# Patient Record
Sex: Female | Born: 1935 | Race: White | Hispanic: No | Marital: Married | Smoking: Former smoker
Health system: Southern US, Community
[De-identification: ages and names within clinical notes are randomized; demographics above are authoritative.]

## PROBLEM LIST (undated history)

## (undated) DIAGNOSIS — M8580 Other specified disorders of bone density and structure, unspecified site: Secondary | ICD-10-CM

## (undated) DIAGNOSIS — H269 Unspecified cataract: Secondary | ICD-10-CM

## (undated) DIAGNOSIS — I10 Essential (primary) hypertension: Secondary | ICD-10-CM

## (undated) DIAGNOSIS — E079 Disorder of thyroid, unspecified: Secondary | ICD-10-CM

## (undated) DIAGNOSIS — J449 Chronic obstructive pulmonary disease, unspecified: Secondary | ICD-10-CM

## (undated) DIAGNOSIS — R1310 Dysphagia, unspecified: Secondary | ICD-10-CM

## (undated) DIAGNOSIS — K219 Gastro-esophageal reflux disease without esophagitis: Secondary | ICD-10-CM

## (undated) HISTORY — DX: Unspecified cataract: H26.9

## (undated) HISTORY — DX: Gastro-esophageal reflux disease without esophagitis: K21.9

## (undated) HISTORY — DX: Other specified disorders of bone density and structure, unspecified site: M85.80

## (undated) HISTORY — PX: CATARACT EXTRACTION, BILATERAL: SHX1313

---
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
## 2000-03-24 ENCOUNTER — Other Ambulatory Visit: Admission: RE | Admit: 2000-03-24 | Discharge: 2000-03-24 | Payer: Self-pay | Admitting: Family Medicine

## 2004-02-05 ENCOUNTER — Other Ambulatory Visit: Admission: RE | Admit: 2004-02-05 | Discharge: 2004-02-05 | Payer: Self-pay | Admitting: Family Medicine

## 2010-01-15 ENCOUNTER — Ambulatory Visit (HOSPITAL_COMMUNITY): Admission: RE | Admit: 2010-01-15 | Discharge: 2010-01-15 | Payer: Self-pay | Admitting: Family Medicine

## 2010-12-01 ENCOUNTER — Encounter: Payer: Self-pay | Admitting: Nurse Practitioner

## 2010-12-01 DIAGNOSIS — E039 Hypothyroidism, unspecified: Secondary | ICD-10-CM | POA: Insufficient documentation

## 2010-12-01 DIAGNOSIS — K219 Gastro-esophageal reflux disease without esophagitis: Secondary | ICD-10-CM | POA: Insufficient documentation

## 2010-12-01 DIAGNOSIS — E785 Hyperlipidemia, unspecified: Secondary | ICD-10-CM | POA: Insufficient documentation

## 2010-12-01 DIAGNOSIS — I10 Essential (primary) hypertension: Secondary | ICD-10-CM | POA: Insufficient documentation

## 2010-12-01 DIAGNOSIS — M8580 Other specified disorders of bone density and structure, unspecified site: Secondary | ICD-10-CM

## 2012-09-01 ENCOUNTER — Encounter (HOSPITAL_COMMUNITY): Payer: Self-pay | Admitting: *Deleted

## 2012-09-01 ENCOUNTER — Emergency Department (HOSPITAL_COMMUNITY): Payer: Medicare Other

## 2012-09-01 ENCOUNTER — Emergency Department (HOSPITAL_COMMUNITY)
Admission: EM | Admit: 2012-09-01 | Discharge: 2012-09-02 | Disposition: A | Discharge status: Home / Self Care | Payer: Medicare Other | Attending: Emergency Medicine | Admitting: Emergency Medicine

## 2012-09-01 DIAGNOSIS — E0789 Other specified disorders of thyroid: Secondary | ICD-10-CM | POA: Insufficient documentation

## 2012-09-01 DIAGNOSIS — J189 Pneumonia, unspecified organism: Secondary | ICD-10-CM

## 2012-09-01 DIAGNOSIS — Z79899 Other long term (current) drug therapy: Secondary | ICD-10-CM | POA: Insufficient documentation

## 2012-09-01 DIAGNOSIS — I10 Essential (primary) hypertension: Secondary | ICD-10-CM | POA: Insufficient documentation

## 2012-09-01 DIAGNOSIS — R197 Diarrhea, unspecified: Secondary | ICD-10-CM | POA: Insufficient documentation

## 2012-09-01 DIAGNOSIS — R51 Headache: Secondary | ICD-10-CM | POA: Insufficient documentation

## 2012-09-01 DIAGNOSIS — J029 Acute pharyngitis, unspecified: Secondary | ICD-10-CM | POA: Insufficient documentation

## 2012-09-01 DIAGNOSIS — R062 Wheezing: Secondary | ICD-10-CM | POA: Insufficient documentation

## 2012-09-01 HISTORY — DX: Essential (primary) hypertension: I10

## 2012-09-01 HISTORY — DX: Disorder of thyroid, unspecified: E07.9

## 2012-09-01 LAB — CBC WITH DIFFERENTIAL/PLATELET
Eosinophils Absolute: 0 10*3/uL (ref 0.0–0.7)
Eosinophils Relative: 0 % (ref 0–5)
HCT: 41.1 % (ref 36.0–46.0)
Lymphs Abs: 0.8 10*3/uL (ref 0.7–4.0)
MCH: 31 pg (ref 26.0–34.0)
MCHC: 33.6 g/dL (ref 30.0–36.0)
MCV: 92.4 fL (ref 78.0–100.0)
Monocytes Absolute: 1.3 10*3/uL — ABNORMAL HIGH (ref 0.1–1.0)
Monocytes Relative: 9 % (ref 3–12)
Neutro Abs: 12.2 10*3/uL — ABNORMAL HIGH (ref 1.7–7.7)
Neutrophils Relative %: 85 % — ABNORMAL HIGH (ref 43–77)
Platelets: 196 10*3/uL (ref 150–400)

## 2012-09-01 LAB — BASIC METABOLIC PANEL
Creatinine, Ser: 0.65 mg/dL (ref 0.50–1.10)
GFR calc Af Amer: >90 mL/min (ref >90)
Glucose, Bld: 113 mg/dL — ABNORMAL HIGH (ref 70–99)
Potassium: 3.7 mEq/L (ref 3.5–5.1)
Sodium: 132 mEq/L — ABNORMAL LOW (ref 135–145)

## 2012-09-01 MED ORDER — SODIUM CHLORIDE 0.9 % IV BOLUS (SEPSIS)
1000.0000 mL | Freq: Once | INTRAVENOUS | Status: AC
  Administered 2012-09-01: 1000 mL via INTRAVENOUS

## 2012-09-01 MED ORDER — AZITHROMYCIN 250 MG PO TABS: ORAL_TABLET | ORAL | Status: DC

## 2012-09-01 MED ORDER — DEXTROSE 5 % IV SOLN
500.0000 mg | Freq: Once | INTRAVENOUS | Status: AC
  Administered 2012-09-01: 500 mg via INTRAVENOUS
  Filled 2012-09-01: qty 500

## 2012-09-01 MED ORDER — ONDANSETRON HCL 4 MG/2ML IJ SOLN
4.0000 mg | Freq: Once | INTRAMUSCULAR | Status: AC
  Administered 2012-09-01: 4 mg via INTRAVENOUS
  Filled 2012-09-01: qty 2

## 2012-09-01 MED ORDER — DEXTROSE 5 % IV SOLN
1.0000 g | Freq: Once | INTRAVENOUS | Status: AC
  Administered 2012-09-02: 1 g via INTRAVENOUS
  Filled 2012-09-01: qty 10

## 2012-09-01 NOTE — ED Provider Notes (Addendum)
History   This chart was scribed for Amy Hutching, MD by Leone Payor, ED Scribe. This patient was seen in room APA11/APA11 and the patient's care was started at 2122.  CSN: [id number]  Arrival date & time 09/01/12  1308   First MD Initiated Contact with Patient 09/01/12 2122      Chief Complaint  Patient presents with  . Cough  . Diarrhea  . Sore Throat  . Headache    The history is provided by the patient and a relative. No language interpreter was used.    ADALAIDE Chambers is a 79 y.o. female who presents to the Emergency Department complaining of new, unchanged, constant, moderate productive cough with associated sore throat, weakness, headache, diarrhea starting 2 days ago. Pt reports only drinking 2 glasses of water today. Pt's husband is also being seen here for similar symptoms.   Nothing makes symptoms better or worse. Severity is moderate   Pt has h/o thyroid disease, HTN.  Past Medical History  Diagnosis Date  . Thyroid disease   . Hypertension     History reviewed. No pertinent past surgical history.  No family history on file.  History  Substance Use Topics  . Smoking status: Not on file  . Smokeless tobacco: Not on file  . Alcohol Use: Not on file    No OB history provided.   Review of Systems A complete 10 system review of systems was obtained and all systems are negative except as noted in the HPI and PMH.    Allergies  Crestor and Niaspan  Home Medications   Current Outpatient Rx  Name  Route  Sig  Dispense  Refill  . BENAZEPRIL-HYDROCHLOROTHIAZIDE 20-12.5 MG PO TABS   Oral   Take 1 tablet by mouth daily.           Marland Kitchen CALCIUM CITRATE-VITAMIN D 250-100 MG-UNIT PO TABS   Oral   Take 1 tablet by mouth 2 (two) times daily.           Marland Kitchen VITAMIN D 2000 UNITS PO CAPS   Oral   Take by mouth. 1 daily           . LEVOTHYROXINE SODIUM 50 MCG PO TABS   Oral   Take 50 mcg by mouth daily.             BP 137/68  Pulse 92  Temp 98.2 F (36.8 C)  (Oral)  Resp 20  Ht 4\' 11"  (1.499 m)  Wt 123 lb (55.792 kg)  BMI 24.84 kg/m2  SpO2 94%  Physical Exam  Nursing note and vitals reviewed. Constitutional: She is oriented to person, place, and time. She appears well-developed and well-nourished.  HENT:  Head: Normocephalic and atraumatic.  Eyes: Conjunctivae normal and EOM are normal. Pupils are equal, round, and reactive to light.  Neck: Normal range of motion. Neck supple.  Cardiovascular: Normal rate, regular rhythm and normal heart sounds.   Pulmonary/Chest: Effort normal and breath sounds normal.  Abdominal: Soft. Bowel sounds are normal.  Musculoskeletal: Normal range of motion.  Neurological: She is alert and oriented to person, place, and time.  Skin: Skin is warm and dry.  Psychiatric: She has a normal mood and affect.    ED Course  Procedures (including critical care time)  DIAGNOSTIC STUDIES: Oxygen Saturation is 94% on room air, adequate by my interpretation.    COORDINATION OF CARE:  9:35 PM Discussed treatment plan which includes IV fluids, CXR, blood work with pt at  bedside and pt agreed to plan.    Labs Reviewed  BASIC METABOLIC PANEL - Abnormal; Notable for the following:    Sodium 132 (*)     Chloride 95 (*)     Glucose, Bld 113 (*)     GFR calc non Af Amer 80 (*)     All other components within normal limits  CBC WITH DIFFERENTIAL - Abnormal; Notable for the following:    WBC 14.3 (*)     Neutrophils Relative 85 (*)     Neutro Abs 12.2 (*)     Lymphocytes Relative 6 (*)     Monocytes Absolute 1.3 (*)     All other components within normal limits    Dg Chest 2 View  09/01/2012  *RADIOLOGY REPORT*  Clinical Data: 79 year old female with cough, congestion and fever.  CHEST - 2 VIEW  Comparison: None  Findings: Right middle lobe airspace disease is compatible with pneumonia. A small right pleural effusion is present. The cardiomediastinal silhouette is otherwise unremarkable. There is no evidence of  pneumothorax or pulmonary edema. No acute bony abnormalities are identified.  IMPRESSION: Right middle lobe pneumonia - radiographic follow up to resolution is recommended.  Small right pleural effusion.   Original Report Authenticated By: Harmon Pier, M.D.    No results found.   No diagnosis found.    MDM    Chest x-ray shows right middle lobe pneumonia. Patient is hemodynamically stable and oxygenating well. IV Rocephin and IV Zithromax. Discharge home on by mouth Zithromax.  Discussed with patient and her 2 daughters    I personally performed the services described in this documentation, which was scribed in my presence. The recorded information has been reviewed and is accurate.   Amy Hutching, MD 09/01/12 2317  Amy Hutching, MD 09/01/12 [id number]

## 2012-09-01 NOTE — ED Notes (Signed)
Pt c/o cough, sore throat, weak, headache, and diarrhea since Thursday.

## 2012-11-08 ENCOUNTER — Other Ambulatory Visit: Payer: Self-pay

## 2012-11-08 MED ORDER — LEVOTHYROXINE SODIUM 75 MCG PO TABS: 75.0000 ug | ORAL_TABLET | Freq: Every day | ORAL | Status: DC

## 2013-02-04 ENCOUNTER — Encounter: Payer: Self-pay | Admitting: General Practice

## 2013-02-04 ENCOUNTER — Ambulatory Visit (INDEPENDENT_AMBULATORY_CARE_PROVIDER_SITE_OTHER): Payer: Medicare Other | Admitting: General Practice

## 2013-02-04 VITALS — BP 124/65 | HR 65 | Temp 97.1°F | Ht 59.0 in | Wt 124.0 lb

## 2013-02-04 DIAGNOSIS — Z09 Encounter for follow-up examination after completed treatment for conditions other than malignant neoplasm: Secondary | ICD-10-CM

## 2013-02-04 DIAGNOSIS — I10 Essential (primary) hypertension: Secondary | ICD-10-CM

## 2013-02-04 DIAGNOSIS — E039 Hypothyroidism, unspecified: Secondary | ICD-10-CM

## 2013-02-04 DIAGNOSIS — K219 Gastro-esophageal reflux disease without esophagitis: Secondary | ICD-10-CM

## 2013-02-04 LAB — POCT CBC
Granulocyte percent: 85 %G — AB (ref 37–80)
HCT, POC: 38.5 % (ref 37.7–47.9)
Hemoglobin: 13.5 g/dL (ref 12.2–16.2)
MCHC: 35.1 g/dL (ref 31.8–35.4)
MCV: 89.8 fL (ref 80–97)
POC Granulocyte: 7.1 — AB (ref 2–6.9)
RBC: 4.3 M/uL (ref 4.04–5.48)
RDW, POC: 13.3 %
WBC: 8.4 10*3/uL (ref 4.6–10.2)

## 2013-02-04 LAB — COMPLETE METABOLIC PANEL WITH GFR
ALT: 12 U/L (ref 0–35)
Albumin: 3.7 g/dL (ref 3.5–5.2)
BUN: 18 mg/dL (ref 6–23)
CO2: 26 mEq/L (ref 19–32)
Calcium: 9.6 mg/dL (ref 8.4–10.5)
GFR, Est Non African American: 66 mL/min
Sodium: 139 mEq/L (ref 135–145)

## 2013-02-04 LAB — THYROID PANEL WITH TSH
T3 Uptake: 41.8 % — ABNORMAL HIGH (ref 22.5–37.0)
T4, Total: 13.7 ug/dL — ABNORMAL HIGH (ref 5.0–12.5)
TSH: 0.367 u[IU]/mL (ref 0.350–4.500)

## 2013-02-04 MED ORDER — LEVOTHYROXINE SODIUM 75 MCG PO TABS: 75.0000 ug | ORAL_TABLET | Freq: Every day | ORAL | Status: DC

## 2013-02-04 MED ORDER — OMEPRAZOLE 20 MG PO CPDR: 20.0000 mg | DELAYED_RELEASE_CAPSULE | Freq: Every day | ORAL | Status: DC

## 2013-02-04 MED ORDER — BENAZEPRIL HCL 20 MG PO TABS: 20.0000 mg | ORAL_TABLET | Freq: Every day | ORAL | Status: DC

## 2013-02-04 NOTE — Progress Notes (Signed)
  Subjective:    Patient ID: Amy Chambers, female    DOB: November 13, 1927, 79 y.o.   MRN: [id number]  HPI Presents today for 4 month follow up of chronic health conditions. She has history of Hypothyroidism and hypertension. She reports recently discontinuing GERD medication and is now experiencing acid reflux. Reports having acid reflux after eating and awakens her during the night. Reports taking medications as directed.     Review of Systems  Constitutional: Negative for fever and chills.  Respiratory: Negative for chest tightness and shortness of breath.   Cardiovascular: Negative for chest pain and palpitations.  Gastrointestinal: Negative for vomiting, abdominal pain, diarrhea and blood in stool.  Genitourinary: Negative for dysuria, hematuria, vaginal bleeding, difficulty urinating and pelvic pain.  Neurological: Negative for dizziness, syncope, weakness and headaches.  All other systems reviewed and are negative.       Objective:   Physical Exam  Constitutional: She is oriented to person, place, and time. She appears well-developed and well-nourished.  HENT:  Head: Normocephalic and atraumatic.  Right Ear: External ear normal.  Left Ear: External ear normal.  Cardiovascular: Normal rate, regular rhythm and normal heart sounds.   Pulmonary/Chest: Effort normal and breath sounds normal. No respiratory distress. She exhibits no tenderness.  Abdominal: Soft. Bowel sounds are normal. She exhibits no distension. There is no tenderness.  Neurological: She is alert and oriented to person, place, and time.  Skin: Skin is warm and dry.  Psychiatric: She has a normal mood and affect.          Assessment & Plan:  1. GERD (gastroesophageal reflux disease) - omeprazole (PRILOSEC) 20 MG capsule; Take 1 capsule (20 mg total) by mouth daily.  Dispense: 30 capsule; Refill: 3  2. Unspecified hypothyroidism - levothyroxine (SYNTHROID, LEVOTHROID) 75 MCG tablet; Take 1 tablet (75 mcg total)  by mouth daily.  Dispense: 30 tablet; Refill: 10 - Thyroid Panel With TSH - Vitamin D 25 hydroxy  3. Essential hypertension, benign - benazepril (LOTENSIN) 20 MG tablet; Take 1 tablet (20 mg total) by mouth daily.  Dispense: 30 tablet; Refill: 3 - POCT CBC - COMPLETE METABOLIC PANEL WITH GFR  4. Follow-up exam, 3-6 months since previous exam - NMR Lipoprofile with Lipids -Continue all current medications Labs pending F/u in 3 months Discussed GERD diet Discussed exercise and diet  Patient verbalized understanding Coralie Keens, [name]-C

## 2013-02-04 NOTE — Patient Instructions (Addendum)
Gastroesophageal Reflux Disease, Adult  Gastroesophageal reflux disease (GERD) happens when acid from your stomach flows up into the esophagus. When acid comes in contact with the esophagus, the acid causes soreness (inflammation) in the esophagus. Over time, GERD may create small holes (ulcers) in the lining of the esophagus.  CAUSES   · Increased body weight. This puts pressure on the stomach, making acid rise from the stomach into the esophagus.  · Smoking. This increases acid production in the stomach.  · Drinking alcohol. This causes decreased pressure in the lower esophageal sphincter (valve or ring of muscle between the esophagus and stomach), allowing acid from the stomach into the esophagus.  · Late evening meals and a full stomach. This increases pressure and acid production in the stomach.  · A malformed lower esophageal sphincter.  Sometimes, no cause is found.  SYMPTOMS   · Burning pain in the lower part of the mid-chest behind the breastbone and in the mid-stomach area. This may occur twice a week or more often.  · Trouble swallowing.  · Sore throat.  · Dry cough.  · Asthma-like symptoms including chest tightness, shortness of breath, or wheezing.  DIAGNOSIS   Your caregiver may be able to diagnose GERD based on your symptoms. In some cases, X-rays and other tests may be done to check for complications or to check the condition of your stomach and esophagus.  TREATMENT   Your caregiver may recommend over-the-counter or prescription medicines to help decrease acid production. Ask your caregiver before starting or adding any new medicines.   HOME CARE INSTRUCTIONS   · Change the factors that you can control. Ask your caregiver for guidance concerning weight loss, quitting smoking, and alcohol consumption.  · Avoid foods and drinks that make your symptoms worse, such as:  · Caffeine or alcoholic drinks.  · Chocolate.  · Peppermint or mint flavorings.  · Garlic and onions.  · Spicy foods.  · Citrus fruits,  such as oranges, lemons, or limes.  · Tomato-based foods such as sauce, chili, salsa, and pizza.  · Fried and fatty foods.  · Avoid lying down for the 3 hours prior to your bedtime or prior to taking a nap.  · Eat small, frequent meals instead of large meals.  · Wear loose-fitting clothing. Do not wear anything tight around your waist that causes pressure on your stomach.  · Raise the head of your bed 6 to 8 inches with wood blocks to help you sleep. Extra pillows will not help.  · Only take over-the-counter or prescription medicines for pain, discomfort, or fever as directed by your caregiver.  · Do not take aspirin, ibuprofen, or other nonsteroidal anti-inflammatory drugs (NSAIDs).  SEEK IMMEDIATE MEDICAL CARE IF:   · You have pain in your arms, neck, jaw, teeth, or back.  · Your pain increases or changes in intensity or duration.  · You develop nausea, vomiting, or sweating (diaphoresis).  · You develop shortness of breath, or you faint.  · Your vomit is green, yellow, black, or looks like coffee grounds or blood.  · Your stool is red, bloody, or black.  These symptoms could be signs of other problems, such as heart disease, gastric bleeding, or esophageal bleeding.  MAKE SURE YOU:   · Understand these instructions.  · Will watch your condition.  · Will get help right away if you are not doing well or get worse.  Document Released: 05/11/2005 Document Revised: 10/24/2011 Document Reviewed: 02/18/2011  [employer]® Patient   Information ©2014 [employer].

## 2013-02-05 LAB — NMR LIPOPROFILE WITH LIPIDS
HDL Particle Number: 26.3 umol/L — ABNORMAL LOW (ref >=30.5)
HDL Size: 8.8 nm — ABNORMAL LOW (ref >=9.2)
HDL-C: 35 mg/dL — ABNORMAL LOW (ref >=40)
LDL (calc): 88 mg/dL (ref <100)
LDL Size: 20.2 nm — ABNORMAL LOW (ref >20.5)
Large HDL-P: 2.5 umol/L — ABNORMAL LOW (ref >=4.8)

## 2013-02-05 LAB — VITAMIN D 25 HYDROXY (VIT D DEFICIENCY, FRACTURES): Vit D, 25-Hydroxy: 57 ng/mL (ref 30–89)

## 2013-05-31 ENCOUNTER — Other Ambulatory Visit: Payer: Self-pay | Admitting: General Practice

## 2013-06-07 ENCOUNTER — Ambulatory Visit (INDEPENDENT_AMBULATORY_CARE_PROVIDER_SITE_OTHER): Payer: Medicare Other | Admitting: General Practice

## 2013-06-07 ENCOUNTER — Encounter: Payer: Self-pay | Admitting: General Practice

## 2013-06-07 VITALS — BP 111/65 | HR 69 | Temp 97.0°F | Ht 59.0 in | Wt 118.0 lb

## 2013-06-07 DIAGNOSIS — I10 Essential (primary) hypertension: Secondary | ICD-10-CM

## 2013-06-07 DIAGNOSIS — E039 Hypothyroidism, unspecified: Secondary | ICD-10-CM

## 2013-06-07 DIAGNOSIS — J45909 Unspecified asthma, uncomplicated: Secondary | ICD-10-CM

## 2013-06-07 DIAGNOSIS — Z23 Encounter for immunization: Secondary | ICD-10-CM

## 2013-06-07 MED ORDER — BENAZEPRIL HCL 20 MG PO TABS: 20.0000 mg | ORAL_TABLET | Freq: Every day | ORAL | Status: DC

## 2013-06-07 MED ORDER — OMEPRAZOLE 20 MG PO CPDR: 20.0000 mg | DELAYED_RELEASE_CAPSULE | Freq: Every day | ORAL | Status: DC

## 2013-06-07 MED ORDER — ALBUTEROL SULFATE HFA 108 (90 BASE) MCG/ACT IN AERS: 2.0000 | INHALATION_SPRAY | Freq: Four times a day (QID) | RESPIRATORY_TRACT | Status: DC | PRN

## 2013-06-07 NOTE — Progress Notes (Signed)
  Subjective:    Patient ID: Amy Chambers, female    DOB: [birth year], 79 y.o.   MRN: [id number]  HPI Patient presents today for chronic health visit. She has a history of HTN, Hypothyroidism, GERd, and asthma. She reports taking medications as prescribed. Reports eating a regular diet. Denies checking blood pressures at home.     Review of Systems  Constitutional: Negative for fever and chills.  Respiratory: Positive for shortness of breath. Negative for chest tightness and wheezing.        Short of breath at times with exertion  Cardiovascular: Negative for chest pain and palpitations.  Gastrointestinal: Negative for nausea, vomiting, abdominal pain, diarrhea, constipation and blood in stool.  Genitourinary: Negative for dysuria, hematuria and difficulty urinating.  Neurological: Negative for dizziness, weakness and headaches.       Objective:   Physical Exam  Constitutional: She is oriented to person, place, and time. She appears well-developed and well-nourished.  HENT:  Head: Normocephalic and atraumatic.  Right Ear: External ear normal.  Left Ear: External ear normal.  Nose: Nose normal.  Mouth/Throat: Oropharynx is clear and moist.  Eyes: EOM are normal. Pupils are equal, round, and reactive to light.  Neck: Normal range of motion. Neck supple. No thyromegaly present.  Cardiovascular: Normal rate, regular rhythm and normal heart sounds.   Pulmonary/Chest: Effort normal. No respiratory distress. She exhibits no tenderness.  Diminished bilateral upper lobe breath sounds  Abdominal: Soft. Bowel sounds are normal. She exhibits no distension. There is no tenderness.  Musculoskeletal: She exhibits no edema and no tenderness.  Lymphadenopathy:    She has no cervical adenopathy.  Neurological: She is alert and oriented to person, place, and time.  Skin: Skin is warm and dry.  Psychiatric: She has a normal mood and affect.          Assessment & Plan:  1. Hypertension  -  CMP14+EGFR - benazepril (LOTENSIN) 20 MG tablet; Take 1 tablet (20 mg total) by mouth daily.  Dispense: 30 tablet; Refill: 3  2. Hypothyroid  - Thyroid Panel With TSH  3. Essential hypertension, benign  - benazepril (LOTENSIN) 20 MG tablet; Take 1 tablet (20 mg total) by mouth daily.  Dispense: 30 tablet; Refill: 3  4. Unspecified hypothyroidism   5. Asthma  - albuterol (PROVENTIL HFA;VENTOLIN HFA) 108 (90 BASE) MCG/ACT inhaler; Inhale 2 puffs into the lungs every 6 (six) hours as needed for wheezing.  Dispense: 1 Inhaler; Refill: 3  6. Need for prophylactic vaccination and inoculation against influenza   7. Need for prophylactic vaccination against Streptococcus pneumoniae (pneumococcus)  - Pneumococcal conjugate vaccine 13-valent less than 5yo IM -Continue all current medications Labs pending, cmp, lipid panel F/u in 3 months Discussed exercise and diet  Patient verbalized understanding Coralie Keens, [name]-C

## 2013-06-07 NOTE — Progress Notes (Signed)
PREVNAR and fluarix given. Pt tolerated well

## 2013-06-08 LAB — CMP14+EGFR
Albumin/Globulin Ratio: 1.8 (ref 1.1–2.5)
Albumin: 4.2 g/dL (ref 3.5–4.7)
BUN: 24 mg/dL (ref 8–27)
GFR calc non Af Amer: 71 mL/min/{1.73_m2} (ref >59)
Glucose: 86 mg/dL (ref 65–99)
Sodium: 140 mmol/L (ref 134–144)
Total Bilirubin: 0.3 mg/dL (ref 0.0–1.2)

## 2013-06-11 ENCOUNTER — Other Ambulatory Visit: Payer: Self-pay | Admitting: General Practice

## 2013-06-11 DIAGNOSIS — R7989 Other specified abnormal findings of blood chemistry: Secondary | ICD-10-CM

## 2013-06-11 DIAGNOSIS — E039 Hypothyroidism, unspecified: Secondary | ICD-10-CM

## 2013-06-11 MED ORDER — LEVOTHYROXINE SODIUM 50 MCG PO TABS: 50.0000 ug | ORAL_TABLET | Freq: Every day | ORAL | Status: DC

## 2013-06-25 ENCOUNTER — Other Ambulatory Visit: Payer: Self-pay | Admitting: General Practice

## 2013-07-25 ENCOUNTER — Other Ambulatory Visit (INDEPENDENT_AMBULATORY_CARE_PROVIDER_SITE_OTHER): Payer: Medicare Other

## 2013-07-25 DIAGNOSIS — R7989 Other specified abnormal findings of blood chemistry: Secondary | ICD-10-CM

## 2013-07-25 DIAGNOSIS — R946 Abnormal results of thyroid function studies: Secondary | ICD-10-CM

## 2013-07-25 NOTE — Progress Notes (Signed)
Pt came in for labs only 

## 2013-07-26 LAB — THYROID PANEL WITH TSH
Free Thyroxine Index: 2.3 (ref 1.2–4.9)
T3 Uptake Ratio: 27 % (ref 24–39)
TSH: 10.19 u[IU]/mL — ABNORMAL HIGH (ref 0.450–4.500)

## 2013-07-30 ENCOUNTER — Other Ambulatory Visit: Payer: Self-pay | Admitting: General Practice

## 2013-08-01 ENCOUNTER — Other Ambulatory Visit: Payer: Self-pay | Admitting: General Practice

## 2013-08-01 DIAGNOSIS — R7989 Other specified abnormal findings of blood chemistry: Secondary | ICD-10-CM

## 2013-08-01 DIAGNOSIS — E039 Hypothyroidism, unspecified: Secondary | ICD-10-CM

## 2013-08-01 MED ORDER — LEVOTHYROXINE SODIUM 75 MCG PO TABS: 75.0000 ug | ORAL_TABLET | Freq: Every day | ORAL | Status: DC

## 2013-08-27 ENCOUNTER — Ambulatory Visit (INDEPENDENT_AMBULATORY_CARE_PROVIDER_SITE_OTHER): Payer: Medicare Other | Admitting: Family Medicine

## 2013-08-27 VITALS — BP 101/61 | HR 79 | Temp 97.0°F | Ht 59.0 in | Wt 118.0 lb

## 2013-08-27 DIAGNOSIS — J069 Acute upper respiratory infection, unspecified: Secondary | ICD-10-CM

## 2013-08-27 MED ORDER — BENZONATATE 100 MG PO CAPS: 100.0000 mg | ORAL_CAPSULE | Freq: Three times a day (TID) | ORAL | Status: DC | PRN

## 2013-08-27 MED ORDER — AZITHROMYCIN 250 MG PO TABS
ORAL_TABLET | ORAL | Status: DC
Start: 2013-08-27 — End: 2014-02-12

## 2013-08-27 NOTE — Progress Notes (Signed)
   Subjective:    Patient ID: Amy Chambers, female    DOB: Oct 26, 1927, 80 y.o.   MRN: [id number]  HPI This 80 y.o. female presents for evaluation of uri sx's for a week.   Review of Systems C/lo uri sx's   No chest pain, SOB, HA, dizziness, vision change, N/V, diarrhea, constipation, dysuria, urinary urgency or frequency, myalgias, arthralgias or rash.  Objective:   Physical Exam  Vital signs noted  Well developed well nourished female.  HEENT - Head atraumatic Normocephalic                Eyes - PERRLA, Conjuctiva - clear Sclera- Clear EOMI                Ears - EAC's Wnl TM's Wnl Gross Hearing WNL                Throat - oropharanx wnl Respiratory - Lungs CTA bilateral Cardiac - RRR S1 and S2 without murmur GI - Abdomen soft Nontender and bowel sounds active x 4 Extremities - No edema. Neuro - Grossly intact.      Assessment & Plan:  URI (upper respiratory infection) - Plan: azithromycin (ZITHROMAX) 250 MG tablet, benzonatate (TESSALON PERLES) 100 MG capsule Push po fluids, rest, tylenol and motrin otc prn as directed for fever, arthralgias, and myalgias.  Follow up prn if sx's continue or persist.  Deatra CanterWilliam J Oxford [name]

## 2013-08-27 NOTE — Patient Instructions (Signed)

## 2013-10-15 ENCOUNTER — Ambulatory Visit (INDEPENDENT_AMBULATORY_CARE_PROVIDER_SITE_OTHER): Payer: Medicare Other | Admitting: General Practice

## 2013-10-15 VITALS — BP 107/66 | HR 70 | Temp 97.0°F | Wt 116.0 lb

## 2013-10-15 DIAGNOSIS — E039 Hypothyroidism, unspecified: Secondary | ICD-10-CM

## 2013-10-15 DIAGNOSIS — I10 Essential (primary) hypertension: Secondary | ICD-10-CM

## 2013-10-15 MED ORDER — OMEPRAZOLE 20 MG PO CPDR: 20.0000 mg | DELAYED_RELEASE_CAPSULE | Freq: Every day | ORAL | Status: DC

## 2013-10-15 MED ORDER — BENAZEPRIL HCL 20 MG PO TABS: 20.0000 mg | ORAL_TABLET | Freq: Every day | ORAL | Status: DC

## 2013-10-15 MED ORDER — LEVOTHYROXINE SODIUM 75 MCG PO TABS: 75.0000 ug | ORAL_TABLET | Freq: Every day | ORAL | Status: DC

## 2013-10-15 NOTE — Patient Instructions (Signed)

## 2013-10-15 NOTE — Progress Notes (Signed)
   Subjective:    Patient ID: Amy Chambers, female    DOB: 08-16-1927, 80 y.o.   MRN: [id number]  HPI  Patient presents today for chronic health visit. History of HTN, Hypothyroidism, GERD, and asthma. She reports taking medications as prescribed. Reports eating a regular diet, denies checking blood pressures at home.      Review of Systems  Constitutional: Negative for fever and chills.  Respiratory: Negative for chest tightness and shortness of breath.   Cardiovascular: Negative for chest pain and palpitations.  Gastrointestinal: Negative for nausea, vomiting, abdominal pain, diarrhea, constipation and blood in stool.  All other systems reviewed and are negative.       Objective:   Physical Exam  Constitutional: She is oriented to person, place, and time. She appears well-developed and well-nourished.  HENT:  Head: Normocephalic and atraumatic.  Right Ear: External ear normal.  Left Ear: External ear normal.  Nose: Nose normal.  Mouth/Throat: Oropharynx is clear and moist.  Eyes: EOM are normal.  Cataract surgery bilaterally  Neck: Normal range of motion. Neck supple. No thyromegaly present.  Cardiovascular: Normal rate, regular rhythm and normal heart sounds.   Pulmonary/Chest: Effort normal and breath sounds normal. No respiratory distress. She exhibits no tenderness.  Lymphadenopathy:    She has no cervical adenopathy.  Neurological: She is alert and oriented to person, place, and time.  Skin: Skin is warm and dry.  Psychiatric: She has a normal mood and affect.          Assessment & Plan:  1. Hypertension  - benazepril (LOTENSIN) 20 MG tablet; Take 1 tablet (20 mg total) by mouth daily.  Dispense: 30 tablet; Refill: 4 - CMP14+EGFR  2. Hypothyroidism  - Thyroid Panel With TSH Continue all current medications Labs pending F/u in 3 months Discussed benefits of regular exercise and healthy eating Patient verbalized understanding Erby Pian,  [name]-C

## 2013-10-16 LAB — CMP14+EGFR
ALBUMIN: 4.2 g/dL (ref 3.5–4.7)
ALT: 10 IU/L (ref 0–32)
AST: 20 IU/L (ref 0–40)
Albumin/Globulin Ratio: 1.6 (ref 1.1–2.5)
Alkaline Phosphatase: 73 IU/L (ref 39–117)
BUN / CREAT RATIO: 22 (ref 11–26)
BUN: 19 mg/dL (ref 8–27)
CALCIUM: 10.5 mg/dL — AB (ref 8.7–10.3)
CO2: 24 mmol/L (ref 18–29)
CREATININE: 0.88 mg/dL (ref 0.57–1.00)
Chloride: 99 mmol/L (ref 97–108)
GFR calc Af Amer: 70 mL/min/{1.73_m2} (ref >59)
GFR, EST NON AFRICAN AMERICAN: 61 mL/min/{1.73_m2} (ref >59)
GLOBULIN, TOTAL: 2.6 g/dL (ref 1.5–4.5)
Glucose: 91 mg/dL (ref 65–99)
Potassium: 5.3 mmol/L — ABNORMAL HIGH (ref 3.5–5.2)
Sodium: 141 mmol/L (ref 134–144)
Total Bilirubin: 0.4 mg/dL (ref 0.0–1.2)
Total Protein: 6.8 g/dL (ref 6.0–8.5)

## 2013-10-16 LAB — THYROID PANEL WITH TSH
Free Thyroxine Index: 3.7 (ref 1.2–4.9)
T3 UPTAKE RATIO: 29 % (ref 24–39)
T4, Total: 12.6 ug/dL — ABNORMAL HIGH (ref 4.5–12.0)
TSH: 1.77 u[IU]/mL (ref 0.450–4.500)

## 2013-10-21 ENCOUNTER — Other Ambulatory Visit: Payer: Self-pay | Admitting: General Practice

## 2013-10-21 DIAGNOSIS — E039 Hypothyroidism, unspecified: Secondary | ICD-10-CM

## 2013-10-21 DIAGNOSIS — E875 Hyperkalemia: Secondary | ICD-10-CM

## 2013-10-21 MED ORDER — LEVOTHYROXINE SODIUM 75 MCG PO TABS: 75.0000 ug | ORAL_TABLET | Freq: Every day | ORAL | Status: DC

## 2013-10-24 ENCOUNTER — Other Ambulatory Visit (INDEPENDENT_AMBULATORY_CARE_PROVIDER_SITE_OTHER): Payer: Medicare Other

## 2013-10-24 DIAGNOSIS — E875 Hyperkalemia: Secondary | ICD-10-CM

## 2013-10-24 NOTE — Progress Notes (Signed)
Pt came in for lab  only 

## 2013-10-25 LAB — POTASSIUM: POTASSIUM: 4.4 mmol/L (ref 3.5–5.2)

## 2014-01-07 ENCOUNTER — Encounter: Payer: Self-pay | Admitting: *Deleted

## 2014-02-11 ENCOUNTER — Ambulatory Visit: Payer: Medicare Other | Admitting: General Practice

## 2014-02-12 ENCOUNTER — Encounter: Payer: Self-pay | Admitting: Nurse Practitioner

## 2014-02-12 ENCOUNTER — Ambulatory Visit (INDEPENDENT_AMBULATORY_CARE_PROVIDER_SITE_OTHER): Payer: Medicare Other | Admitting: Nurse Practitioner

## 2014-02-12 VITALS — BP 108/62 | HR 71 | Temp 98.2°F | Ht 59.0 in | Wt 114.8 lb

## 2014-02-12 DIAGNOSIS — E785 Hyperlipidemia, unspecified: Secondary | ICD-10-CM

## 2014-02-12 DIAGNOSIS — I10 Essential (primary) hypertension: Secondary | ICD-10-CM

## 2014-02-12 DIAGNOSIS — E039 Hypothyroidism, unspecified: Secondary | ICD-10-CM

## 2014-02-12 DIAGNOSIS — M8580 Other specified disorders of bone density and structure, unspecified site: Secondary | ICD-10-CM

## 2014-02-12 DIAGNOSIS — K219 Gastro-esophageal reflux disease without esophagitis: Secondary | ICD-10-CM

## 2014-02-12 DIAGNOSIS — M949 Disorder of cartilage, unspecified: Secondary | ICD-10-CM

## 2014-02-12 DIAGNOSIS — M899 Disorder of bone, unspecified: Secondary | ICD-10-CM

## 2014-02-12 MED ORDER — BENAZEPRIL HCL 20 MG PO TABS: 20.0000 mg | ORAL_TABLET | Freq: Every day | ORAL | Status: DC

## 2014-02-12 MED ORDER — LEVOTHYROXINE SODIUM 75 MCG PO TABS: 75.0000 ug | ORAL_TABLET | Freq: Every day | ORAL | Status: DC

## 2014-02-12 MED ORDER — OMEPRAZOLE 20 MG PO CPDR: 20.0000 mg | DELAYED_RELEASE_CAPSULE | Freq: Every day | ORAL | Status: DC

## 2014-02-12 NOTE — Progress Notes (Signed)
Subjective:    Patient ID: Amy Chambers, female    DOB: 1928/08/15, 80 y.o.   MRN: [id number]  Patient here today for follow up of chronic medical problems. No complaints today  Hypertension This is a chronic problem. The current episode started more than 1 year ago. The problem is controlled. Pertinent negatives include no chest pain. The current treatment provides moderate improvement. Hypertensive end-organ damage includes a thyroid problem.  Gastrophageal Reflux She reports no abdominal pain, no chest pain, no choking, no coughing or no dysphagia. This is a chronic problem. The current episode started more than 1 year ago. The problem occurs constantly (daily prilosec. ). The symptoms are aggravated by certain foods. She has tried a PPI for the symptoms. The treatment provided significant relief.  Hyperlipidemia This is a chronic problem. The current episode started more than 1 year ago. The problem is controlled. Exacerbating diseases include hypothyroidism. Associated symptoms include leg pain. Pertinent negatives include no chest pain. Current antihyperlipidemic treatment includes statins. The current treatment provides moderate improvement of lipids. There are no compliance problems.  Risk factors for coronary artery disease include hypertension.  Osteopenia.  calcium and vitamin d OTC daily- no side effects  Review of Systems  Constitutional: Negative.   HENT: Negative.   Eyes: Negative.   Respiratory: Negative.  Negative for cough and choking.   Cardiovascular: Negative for chest pain.  Gastrointestinal: Negative for dysphagia and abdominal pain.  Endocrine: Negative.   Genitourinary: Negative.   Allergic/Immunologic: Negative.   Neurological: Negative.   Hematological: Negative.   Psychiatric/Behavioral: Negative.        Objective:   Physical Exam  Constitutional: She is oriented to person, place, and time. She appears well-developed.  HENT:  Head: Normocephalic.    Neck: Normal range of motion.  Cardiovascular: Normal rate.   Pulmonary/Chest: Effort normal.  Abdominal: Soft.  Musculoskeletal: Normal range of motion.  Neurological: She is alert and oriented to person, place, and time.  Skin: Skin is warm.    BP 108/62  Pulse 71  Temp(Src) 98.2 F (36.8 C) (Oral)  Ht 4' 11"  (1.499 m)  Wt 114 lb 12.8 oz (52.073 kg)  BMI 23.17 kg/m2  SpO2 93%       Assessment & Plan:   1. Osteopenia   2. Hyperlipemia   3. Essential hypertension   4. Gastroesophageal reflux disease without esophagitis   5. Hypothyroidism, unspecified hypothyroidism type   6. Unspecified hypothyroidism    Orders Placed This Encounter  Procedures  . DG Bone Density    Standing Status: Future     Number of Occurrences:      Standing Expiration Date: 04/14/2015    Order Specific Question:  Reason for Exam (SYMPTOM  OR DIAGNOSIS REQUIRED)    Answer:  screnning    Order Specific Question:  Preferred imaging location?    Answer:  Internal  . CMP14+EGFR  . NMR, lipoprofile   Meds ordered this encounter  Medications  . benazepril (LOTENSIN) 20 MG tablet    Sig: Take 1 tablet (20 mg total) by mouth daily.    Dispense:  30 tablet    Refill:  5    Order Specific Question:  Supervising Provider    Answer:  Chipper Herb [[id number]]  . levothyroxine (SYNTHROID, LEVOTHROID) 75 MCG tablet    Sig: Take 1 tablet (75 mcg total) by mouth daily.    Dispense:  30 tablet    Refill:  5    Order Specific  Question:  Supervising Provider    Answer:  Chipper Herb [[id number]]  . omeprazole (PRILOSEC) 20 MG capsule    Sig: Take 1 capsule (20 mg total) by mouth daily.    Dispense:  30 capsule    Refill:  5    Order Specific Question:  Supervising Provider    Answer:  Chipper Herb [[id number]]   Refused colonoscopy Labs pending Health maintenance reviewed Diet and exercise encouraged Continue all meds Follow up  In 3 months   [place], [name]

## 2014-02-12 NOTE — Patient Instructions (Signed)

## 2014-02-13 LAB — CMP14+EGFR
ALT: 14 IU/L (ref 0–32)
AST: 18 IU/L (ref 0–40)
Albumin/Globulin Ratio: 1.8 (ref 1.1–2.5)
Albumin: 4.2 g/dL (ref 3.5–4.7)
Alkaline Phosphatase: 66 IU/L (ref 39–117)
BUN/Creatinine Ratio: 22 (ref 11–26)
BUN: 19 mg/dL (ref 8–27)
CHLORIDE: 99 mmol/L (ref 97–108)
CO2: 25 mmol/L (ref 18–29)
Calcium: 10 mg/dL (ref 8.7–10.3)
Creatinine, Ser: 0.88 mg/dL (ref 0.57–1.00)
GFR calc Af Amer: 70 mL/min/{1.73_m2} (ref >59)
GFR calc non Af Amer: 61 mL/min/{1.73_m2} (ref >59)
GLOBULIN, TOTAL: 2.3 g/dL (ref 1.5–4.5)
Glucose: 84 mg/dL (ref 65–99)
Potassium: 4.9 mmol/L (ref 3.5–5.2)
Sodium: 140 mmol/L (ref 134–144)
TOTAL PROTEIN: 6.5 g/dL (ref 6.0–8.5)
Total Bilirubin: 0.3 mg/dL (ref 0.0–1.2)

## 2014-02-13 LAB — NMR, LIPOPROFILE
Cholesterol: 172 mg/dL (ref 100–199)
HDL Cholesterol by NMR: 39 mg/dL — ABNORMAL LOW (ref >39)
HDL Particle Number: 25.6 umol/L — ABNORMAL LOW (ref >=30.5)
LDL PARTICLE NUMBER: 1282 nmol/L — AB (ref <1000)
LDL Size: 20.2 nm (ref >20.5)
LDLC SERPL CALC-MCNC: 108 mg/dL — AB (ref 0–99)
LP-IR SCORE: 55 — AB (ref <=45)
Small LDL Particle Number: 812 nmol/L — ABNORMAL HIGH (ref <=527)
Triglycerides by NMR: 125 mg/dL (ref 0–149)

## 2014-04-15 ENCOUNTER — Other Ambulatory Visit: Payer: Self-pay | Admitting: General Practice

## 2014-04-23 ENCOUNTER — Encounter: Payer: Self-pay | Admitting: Pharmacist

## 2014-04-23 ENCOUNTER — Ambulatory Visit (INDEPENDENT_AMBULATORY_CARE_PROVIDER_SITE_OTHER): Payer: Medicare Other

## 2014-04-23 ENCOUNTER — Ambulatory Visit (INDEPENDENT_AMBULATORY_CARE_PROVIDER_SITE_OTHER): Payer: Medicare Other | Admitting: Pharmacist

## 2014-04-23 VITALS — BP 136/68 | HR 78 | Ht <= 58 in | Wt 115.5 lb

## 2014-04-23 DIAGNOSIS — Z0000 Encounter for general adult medical examination without abnormal findings: Secondary | ICD-10-CM

## 2014-04-23 DIAGNOSIS — M949 Disorder of cartilage, unspecified: Secondary | ICD-10-CM

## 2014-04-23 DIAGNOSIS — M8580 Other specified disorders of bone density and structure, unspecified site: Secondary | ICD-10-CM

## 2014-04-23 DIAGNOSIS — M899 Disorder of bone, unspecified: Secondary | ICD-10-CM

## 2014-04-23 LAB — HM DEXA SCAN

## 2014-04-23 NOTE — Patient Instructions (Signed)
Health Maintenance Summary    INFLUENZA VACCINE Due Fall 2015       COLONOSCOPY Due Now   Patient Declined    ZOSTAVAX Due Now   Patient Declined   Mammogram Due Now     Dexa / Bone Denisty Next Due 04/2016 Last 04/23/2014   Pneumonia Vaccine Completed 06/07/2013     TETANUS/TDAP Next Due 01/29/2022  Last 01/30/2012      Fall Prevention and Home Safety Falls cause injuries and can affect all age groups. It is possible to use preventive measures to significantly decrease the likelihood of falls. There are many simple measures which can make your home safer and prevent falls. OUTDOORS  Repair cracks and edges of walkways and driveways.  Remove high doorway thresholds.  Trim shrubbery on the main path into your home.  Have good outside lighting.  Clear walkways of tools, rocks, debris, and clutter.  Check that handrails are not broken and are securely fastened. Both sides of steps should have handrails.  Have leaves, snow, and ice cleared regularly.  Use sand or salt on walkways during winter months.  In the garage, clean up grease or oil spills. BATHROOM  Install night lights.  Install grab bars by the toilet and in the tub and shower.  Use non-skid mats or decals in the tub or shower.  Place a plastic non-slip stool in the shower to sit on, if needed.  Keep floors dry and clean up all water on the floor immediately.  Remove soap buildup in the tub or shower on a regular basis.  Secure bath mats with non-slip, double-sided rug tape.  Remove throw rugs and tripping hazards from the floors. BEDROOMS  Install night lights.  Make sure a bedside light is easy to reach.  Do not use oversized bedding.  Keep a telephone by your bedside.  Have a firm chair with side arms to use for getting dressed.  Remove throw rugs and tripping hazards from the floor. KITCHEN  Keep handles on pots and pans turned toward the center of the stove. Use back burners when  possible.  Clean up spills quickly and allow time for drying.  Avoid walking on wet floors.  Avoid hot utensils and knives.  Position shelves so they are not too high or low.  Place commonly used objects within easy reach.  If necessary, use a sturdy step stool with a grab bar when reaching.  Keep electrical cables out of the way.  Do not use floor polish or wax that makes floors slippery. If you must use wax, use non-skid floor wax.  Remove throw rugs and tripping hazards from the floor. STAIRWAYS  Never leave objects on stairs.  Place handrails on both sides of stairways and use them. Fix any loose handrails. Make sure handrails on both sides of the stairways are as long as the stairs.  Check carpeting to make sure it is firmly attached along stairs. Make repairs to worn or loose carpet promptly.  Avoid placing throw rugs at the top or bottom of stairways, or properly secure the rug with carpet tape to prevent slippage. Get rid of throw rugs, if possible.  Have an electrician put in a light switch at the top and bottom of the stairs. OTHER FALL PREVENTION TIPS  Wear low-heel or rubber-soled shoes that are supportive and fit well. Wear closed toe shoes.  When using a stepladder, make sure it is fully opened and both spreaders are firmly locked. Do not climb a  closed stepladder.  Add color or contrast paint or tape to grab bars and handrails in your home. Place contrasting color strips on first and last steps.  Learn and use mobility aids as needed. Install an electrical emergency response system.  Turn on lights to avoid dark areas. Replace light bulbs that burn out immediately. Get light switches that glow.  Arrange furniture to create clear pathways. Keep furniture in the same place.  Firmly attach carpet with non-skid or double-sided tape.  Eliminate uneven floor surfaces.  Select a carpet pattern that does not visually hide the edge of steps.  Be aware of all  pets. OTHER HOME SAFETY TIPS  Set the water temperature for 120 F (48.8 C).  Keep emergency numbers on or near the telephone.  Keep smoke detectors on every level of the home and near sleeping areas. Document Released: 07/22/2002 Document Revised: 01/31/2012 Document Reviewed: 10/21/2011 [facility] Patient Information 2015 [place], [place]. This information is not intended to replace advice given to you by your health care provider. Make sure you discuss any questions you have with your health care provider.    Preventive Care for Adults A healthy lifestyle and preventive care can promote health and wellness. Preventive health guidelines for women include the following key practices.  A routine yearly physical is a good way to check with your health care provider about your health and preventive screening. It is a chance to share any concerns and updates on your health and to receive a thorough exam.  Visit your dentist for a routine exam and preventive care every 6 months. Brush your teeth twice a day and floss once a day. Good oral hygiene prevents tooth decay and gum disease.  The frequency of eye exams is based on your age, health, family medical history, use of contact lenses, and other factors. Follow your health care provider's recommendations for frequency of eye exams.  Eat a healthy diet. Foods like vegetables, fruits, whole grains, low-fat dairy products, and lean protein foods contain the nutrients you need without too many calories. Decrease your intake of foods high in solid fats, added sugars, and salt. Eat the right amount of calories for you.Get information about a proper diet from your health care provider, if necessary.  Regular physical exercise is one of the most important things you can do for your health. Most adults should get at least 150 minutes of moderate-intensity exercise (any activity that increases your heart rate and causes you to sweat) each week. In  addition, most adults need muscle-strengthening exercises on 2 or more days a week.  Maintain a healthy weight. The body mass index (BMI) is a screening tool to identify possible weight problems. It provides an estimate of body fat based on height and weight. Your health care provider can find your BMI and can help you achieve or maintain a healthy weight.For adults 20 years and older:  A BMI below 18.5 is considered underweight.  A BMI of 18.5 to 24.9 is normal.  A BMI of 25 to 29.9 is considered overweight.  A BMI of 30 and above is considered obese.  Maintain normal blood lipids and cholesterol levels by exercising and minimizing your intake of saturated fat. Eat a balanced diet with plenty of fruit and vegetables. Blood tests for lipids and cholesterol should begin at age 58 and be repeated every 5 years. If your lipid or cholesterol levels are high, you are over 50, or you are at high risk for heart disease, you  may need your cholesterol levels checked more frequently.Ongoing high lipid and cholesterol levels should be treated with medicines if diet and exercise are not working.  If you smoke, find out from your health care provider how to quit. If you do not use tobacco, do not start.  Lung cancer screening is recommended for adults aged 81-80 years who are at high risk for developing lung cancer because of a history of smoking. A yearly low-dose CT scan of the lungs is recommended for people who have at least a 30-pack-year history of smoking and are a current smoker or have quit within the past 15 years. A pack year of smoking is smoking an average of 1 pack of cigarettes a day for 1 year (for example: 1 pack a day for 30 years or 2 packs a day for 15 years). Yearly screening should continue until the smoker has stopped smoking for at least 15 years. Yearly screening should be stopped for people who develop a health problem that would prevent them from having lung cancer treatment.  If  you are pregnant, do not drink alcohol. If you are breastfeeding, be very cautious about drinking alcohol. If you are not pregnant and choose to drink alcohol, do not have more than 1 drink per day. One drink is considered to be 12 ounces (355 mL) of beer, 5 ounces (148 mL) of wine, or 1.5 ounces (44 mL) of liquor.  Avoid use of street drugs. Do not share needles with anyone. Ask for help if you need support or instructions about stopping the use of drugs.  High blood pressure causes heart disease and increases the risk of stroke. Your blood pressure should be checked at least every 1 to 2 years. Ongoing high blood pressure should be treated with medicines if weight loss and exercise do not work.  If you are 25-41 years old, ask your health care provider if you should take aspirin to prevent strokes.  Diabetes screening involves taking a blood sample to check your fasting blood sugar level. This should be done once every 3 years, after age 46, if you are within normal weight and without risk factors for diabetes. Testing should be considered at a younger age or be carried out more frequently if you are overweight and have at least 1 risk factor for diabetes.  Breast cancer screening is essential preventive care for women. You should practice "breast self-awareness." This means understanding the normal appearance and feel of your breasts and may include breast self-examination. Any changes detected, no matter how small, should be reported to a health care provider. Women in their 30s and 30s should have a clinical breast exam (CBE) by a health care provider as part of a regular health exam every 1 to 3 years. After age 32, women should have a CBE every year. Starting at age 80, women should consider having a mammogram (breast X-ray test) every year. Women who have a family history of breast cancer should talk to their health care provider about genetic screening. Women at a high risk of breast cancer should  talk to their health care providers about having an MRI and a mammogram every year.  Breast cancer gene (BRCA)-related cancer risk assessment is recommended for women who have family members with BRCA-related cancers. BRCA-related cancers include breast, ovarian, tubal, and peritoneal cancers. Having family members with these cancers may be associated with an increased risk for harmful changes (mutations) in the breast cancer genes BRCA1 and BRCA2. Results of the  assessment will determine the need for genetic counseling and BRCA1 and BRCA2 testing.  Routine pelvic exams to screen for cancer are no longer recommended for nonpregnant women who are considered low risk for cancer of the pelvic organs (ovaries, uterus, and vagina) and who do not have symptoms. Ask your health care provider if a screening pelvic exam is right for you.  If you have had past treatment for cervical cancer or a condition that could lead to cancer, you need Pap tests and screening for cancer for at least 20 years after your treatment. If Pap tests have been discontinued, your risk factors (such as having a new sexual partner) need to be reassessed to determine if screening should be resumed. Some women have medical problems that increase the chance of getting cervical cancer. In these cases, your health care provider may recommend more frequent screening and Pap tests.  The HPV test is an additional test that may be used for cervical cancer screening. The HPV test looks for the virus that can cause the cell changes on the cervix. The cells collected during the Pap test can be tested for HPV. The HPV test could be used to screen women aged 9 years and older, and should be used in women of any age who have unclear Pap test results. After the age of 28, women should have HPV testing at the same frequency as a Pap test.  Colorectal cancer can be detected and often prevented. Most routine colorectal cancer screening begins at the age of  26 years and continues through age 17 years. However, your health care provider may recommend screening at an earlier age if you have risk factors for colon cancer. On a yearly basis, your health care provider may provide home test kits to check for hidden blood in the stool. Use of a small camera at the end of a tube, to directly examine the colon (sigmoidoscopy or colonoscopy), can detect the earliest forms of colorectal cancer. Talk to your health care provider about this at age 18, when routine screening begins. Direct exam of the colon should be repeated every 5-10 years through age 14 years, unless early forms of pre-cancerous polyps or small growths are found.  People who are at an increased risk for hepatitis B should be screened for this virus. You are considered at high risk for hepatitis B if:  You were born in a country where hepatitis B occurs often. Talk with your health care provider about which countries are considered high risk.  Your parents were born in a high-risk country and you have not received a shot to protect against hepatitis B (hepatitis B vaccine).  You have HIV or AIDS.  You use needles to inject street drugs.  You live with, or have sex with, someone who has hepatitis B.  You get hemodialysis treatment.  You take certain medicines for conditions like cancer, organ transplantation, and autoimmune conditions.  Hepatitis C blood testing is recommended for all people born from [birth year] through 1963 and any individual with known risks for hepatitis C.  Practice safe sex. Use condoms and avoid high-risk sexual practices to reduce the spread of sexually transmitted infections (STIs). STIs include gonorrhea, chlamydia, syphilis, trichomonas, herpes, HPV, and human immunodeficiency virus (HIV). Herpes, HIV, and HPV are viral illnesses that have no cure. They can result in disability, cancer, and death.  You should be screened for sexually transmitted illnesses (STIs)  including gonorrhea and chlamydia if:  You are sexually active and  are younger than 24 years.  You are older than 24 years and your health care provider tells you that you are at risk for this type of infection.  Your sexual activity has changed since you were last screened and you are at an increased risk for chlamydia or gonorrhea. Ask your health care provider if you are at risk.  If you are at risk of being infected with HIV, it is recommended that you take a prescription medicine daily to prevent HIV infection. This is called preexposure prophylaxis (PrEP). You are considered at risk if:  You are a heterosexual woman, are sexually active, and are at increased risk for HIV infection.  You take drugs by injection.  You are sexually active with a partner who has HIV.  Talk with your health care provider about whether you are at high risk of being infected with HIV. If you choose to begin PrEP, you should first be tested for HIV. You should then be tested every 3 months for as long as you are taking PrEP.  Osteoporosis is a disease in which the bones lose minerals and strength with aging. This can result in serious bone fractures or breaks. The risk of osteoporosis can be identified using a bone density scan. Women ages 45 years and over and women at risk for fractures or osteoporosis should discuss screening with their health care providers. Ask your health care provider whether you should take a calcium supplement or vitamin D to reduce the rate of osteoporosis.  Menopause can be associated with physical symptoms and risks. Hormone replacement therapy is available to decrease symptoms and risks. You should talk to your health care provider about whether hormone replacement therapy is right for you.  Use sunscreen. Apply sunscreen liberally and repeatedly throughout the day. You should seek shade when your shadow is shorter than you. Protect yourself by wearing long sleeves, pants, a  wide-brimmed hat, and sunglasses year round, whenever you are outdoors.  Once a month, do a whole body skin exam, using a mirror to look at the skin on your back. Tell your health care provider of new moles, moles that have irregular borders, moles that are larger than a pencil eraser, or moles that have changed in shape or color.  Stay current with required vaccines (immunizations).  Influenza vaccine. All adults should be immunized every year.  Tetanus, diphtheria, and acellular pertussis (Td, Tdap) vaccine. Pregnant women should receive 1 dose of Tdap vaccine during each pregnancy. The dose should be obtained regardless of the length of time since the last dose. Immunization is preferred during the 27th-36th week of gestation. An adult who has not previously received Tdap or who does not know her vaccine status should receive 1 dose of Tdap. This initial dose should be followed by tetanus and diphtheria toxoids (Td) booster doses every 10 years. Adults with an unknown or incomplete history of completing a 3-dose immunization series with Td-containing vaccines should begin or complete a primary immunization series including a Tdap dose. Adults should receive a Td booster every 10 years.  Varicella vaccine. An adult without evidence of immunity to varicella should receive 2 doses or a second dose if she has previously received 1 dose. Pregnant females who do not have evidence of immunity should receive the first dose after pregnancy. This first dose should be obtained before leaving the health care facility. The second dose should be obtained 4-8 weeks after the first dose.  Human papillomavirus (HPV) vaccine. Females aged 13-26  years who have not received the vaccine previously should obtain the 3-dose series. The vaccine is not recommended for use in pregnant females. However, pregnancy testing is not needed before receiving a dose. If a female is found to be pregnant after receiving a dose, no  treatment is needed. In that case, the remaining doses should be delayed until after the pregnancy. Immunization is recommended for any person with an immunocompromised condition through the age of 69 years if she did not get any or all doses earlier. During the 3-dose series, the second dose should be obtained 4-8 weeks after the first dose. The third dose should be obtained 24 weeks after the first dose and 16 weeks after the second dose.  Zoster vaccine. One dose is recommended for adults aged 63 years or older unless certain conditions are present.  Measles, mumps, and rubella (MMR) vaccine. Adults born before [birth year] generally are considered immune to measles and mumps. Adults born in [birth year] or later should have 1 or more doses of MMR vaccine unless there is a contraindication to the vaccine or there is laboratory evidence of immunity to each of the three diseases. A routine second dose of MMR vaccine should be obtained at least 28 days after the first dose for students attending postsecondary schools, health care workers, or international travelers. People who received inactivated measles vaccine or an unknown type of measles vaccine during 1963-1967 should receive 2 doses of MMR vaccine. People who received inactivated mumps vaccine or an unknown type of mumps vaccine before 1979 and are at high risk for mumps infection should consider immunization with 2 doses of MMR vaccine. For females of childbearing age, rubella immunity should be determined. If there is no evidence of immunity, females who are not pregnant should be vaccinated. If there is no evidence of immunity, females who are pregnant should delay immunization until after pregnancy. Unvaccinated health care workers born before [birth year] who lack laboratory evidence of measles, mumps, or rubella immunity or laboratory confirmation of disease should consider measles and mumps immunization with 2 doses of MMR vaccine or rubella immunization with 1 dose of  MMR vaccine.  Pneumococcal 13-valent conjugate (PCV13) vaccine. When indicated, a person who is uncertain of her immunization history and has no record of immunization should receive the PCV13 vaccine. An adult aged 56 years or older who has certain medical conditions and has not been previously immunized should receive 1 dose of PCV13 vaccine. This PCV13 should be followed with a dose of pneumococcal polysaccharide (PPSV23) vaccine. The PPSV23 vaccine dose should be obtained at least 8 weeks after the dose of PCV13 vaccine. An adult aged 18 years or older who has certain medical conditions and previously received 1 or more doses of PPSV23 vaccine should receive 1 dose of PCV13. The PCV13 vaccine dose should be obtained 1 or more years after the last PPSV23 vaccine dose.  Pneumococcal polysaccharide (PPSV23) vaccine. When PCV13 is also indicated, PCV13 should be obtained first. All adults aged 47 years and older should be immunized. An adult younger than age 36 years who has certain medical conditions should be immunized. Any person who resides in a nursing home or long-term care facility should be immunized. An adult smoker should be immunized. People with an immunocompromised condition and certain other conditions should receive both PCV13 and PPSV23 vaccines. People with human immunodeficiency virus (HIV) infection should be immunized as soon as possible after diagnosis. Immunization during chemotherapy or radiation therapy should be avoided. Routine use of  PPSV23 vaccine is not recommended for American Indians, [place] Natives, or people younger than 51 years unless there are medical conditions that require PPSV23 vaccine. When indicated, people who have unknown immunization and have no record of immunization should receive PPSV23 vaccine. One-time revaccination 5 years after the first dose of PPSV23 is recommended for people aged 19-64 years who have chronic kidney failure, nephrotic syndrome, asplenia, or  immunocompromised conditions. People who received 1-2 doses of PPSV23 before age 37 years should receive another dose of PPSV23 vaccine at age 91 years or later if at least 5 years have passed since the previous dose. Doses of PPSV23 are not needed for people immunized with PPSV23 at or after age 19 years.  Meningococcal vaccine. Adults with asplenia or persistent complement component deficiencies should receive 2 doses of quadrivalent meningococcal conjugate (MenACWY-D) vaccine. The doses should be obtained at least 2 months apart. Microbiologists working with certain meningococcal bacteria, [employer] recruits, people at risk during an outbreak, and people who travel to or live in countries with a high rate of meningitis should be immunized. A first-year college student up through age 29 years who is living in a residence hall should receive a dose if she did not receive a dose on or after her 16th birthday. Adults who have certain high-risk conditions should receive one or more doses of vaccine.  Hepatitis A vaccine. Adults who wish to be protected from this disease, have certain high-risk conditions, work with hepatitis A-infected animals, work in hepatitis A research labs, or travel to or work in countries with a high rate of hepatitis A should be immunized. Adults who were previously unvaccinated and who anticipate close contact with an international adoptee during the first 60 days after arrival in the [place] from a country with a high rate of hepatitis A should be immunized.  Hepatitis B vaccine. Adults who wish to be protected from this disease, have certain high-risk conditions, may be exposed to blood or other infectious body fluids, are household contacts or sex partners of hepatitis B positive people, are clients or workers in certain care facilities, or travel to or work in countries with a high rate of hepatitis B should be immunized.  Haemophilus influenzae type b (Hib) vaccine. A  previously unvaccinated person with asplenia or sickle cell disease or having a scheduled splenectomy should receive 1 dose of Hib vaccine. Regardless of previous immunization, a recipient of a hematopoietic stem cell transplant should receive a 3-dose series 6-12 months after her successful transplant. Hib vaccine is not recommended for adults with HIV infection. Preventive Services / Frequency Ages 37 to 70 years  Blood pressure check.** / Every 1 to 2 years.  Lipid and cholesterol check.** / Every 5 years beginning at age 64.  Clinical breast exam.** / Every 3 years for women in their 32s and 91s.  BRCA-related cancer risk assessment.** / For women who have family members with a BRCA-related cancer (breast, ovarian, tubal, or peritoneal cancers).  Pap test.** / Every 2 years from ages 5 through 70. Every 3 years starting at age 21 through age 46 or 63 with a history of 3 consecutive normal Pap tests.  HPV screening.** / Every 3 years from ages 62 through ages 37 to 84 with a history of 3 consecutive normal Pap tests.  Hepatitis C blood test.** / For any individual with known risks for hepatitis C.  Skin self-exam. / Monthly.  Influenza vaccine. / Every year.  Tetanus, diphtheria, and acellular pertussis (  Tdap, Td) vaccine.** / Consult your health care provider. Pregnant women should receive 1 dose of Tdap vaccine during each pregnancy. 1 dose of Td every 10 years.  Varicella vaccine.** / Consult your health care provider. Pregnant females who do not have evidence of immunity should receive the first dose after pregnancy.  HPV vaccine. / 3 doses over 6 months, if 23 and younger. The vaccine is not recommended for use in pregnant females. However, pregnancy testing is not needed before receiving a dose.  Measles, mumps, rubella (MMR) vaccine.** / You need at least 1 dose of MMR if you were born in 1957 or later. You may also need a 2nd dose. For females of childbearing age, rubella  immunity should be determined. If there is no evidence of immunity, females who are not pregnant should be vaccinated. If there is no evidence of immunity, females who are pregnant should delay immunization until after pregnancy.  Pneumococcal 13-valent conjugate (PCV13) vaccine.** / Consult your health care provider.  Pneumococcal polysaccharide (PPSV23) vaccine.** / 1 to 2 doses if you smoke cigarettes or if you have certain conditions.  Meningococcal vaccine.** / 1 dose if you are age 20 to 90 years and a Market researcher living in a residence hall, or have one of several medical conditions, you need to get vaccinated against meningococcal disease. You may also need additional booster doses.  Hepatitis A vaccine.** / Consult your health care provider.  Hepatitis B vaccine.** / Consult your health care provider.  Haemophilus influenzae type b (Hib) vaccine.** / Consult your health care provider. Ages 60 to 58 years  Blood pressure check.** / Every 1 to 2 years.  Lipid and cholesterol check.** / Every 5 years beginning at age 71 years.  Lung cancer screening. / Every year if you are aged 74-80 years and have a 30-pack-year history of smoking and currently smoke or have quit within the past 15 years. Yearly screening is stopped once you have quit smoking for at least 15 years or develop a health problem that would prevent you from having lung cancer treatment.  Clinical breast exam.** / Every year after age 4 years.  BRCA-related cancer risk assessment.** / For women who have family members with a BRCA-related cancer (breast, ovarian, tubal, or peritoneal cancers).  Mammogram.** / Every year beginning at age 74 years and continuing for as long as you are in good health. Consult with your health care provider.  Pap test.** / Every 3 years starting at age 58 years through age 46 or 81 years with a history of 3 consecutive normal Pap tests.  HPV screening.** / Every 3 years from  ages 38 years through ages 47 to 37 years with a history of 3 consecutive normal Pap tests.  Fecal occult blood test (FOBT) of stool. / Every year beginning at age 43 years and continuing until age 32 years. You may not need to do this test if you get a colonoscopy every 10 years.  Flexible sigmoidoscopy or colonoscopy.** / Every 5 years for a flexible sigmoidoscopy or every 10 years for a colonoscopy beginning at age 64 years and continuing until age 36 years.  Hepatitis C blood test.** / For all people born from [birth year] through 1965 and any individual with known risks for hepatitis C.  Skin self-exam. / Monthly.  Influenza vaccine. / Every year.  Tetanus, diphtheria, and acellular pertussis (Tdap/Td) vaccine.** / Consult your health care provider. Pregnant women should receive 1 dose of Tdap vaccine during each  pregnancy. 1 dose of Td every 10 years.  Varicella vaccine.** / Consult your health care provider. Pregnant females who do not have evidence of immunity should receive the first dose after pregnancy.  Zoster vaccine.** / 1 dose for adults aged 60 years or older.  Measles, mumps, rubella (MMR) vaccine.** / You need at least 1 dose of MMR if you were born in 1957 or later. You may also need a 2nd dose. For females of childbearing age, rubella immunity should be determined. If there is no evidence of immunity, females who are not pregnant should be vaccinated. If there is no evidence of immunity, females who are pregnant should delay immunization until after pregnancy.  Pneumococcal 13-valent conjugate (PCV13) vaccine.** / Consult your health care provider.  Pneumococcal polysaccharide (PPSV23) vaccine.** / 1 to 2 doses if you smoke cigarettes or if you have certain conditions.  Meningococcal vaccine.** / Consult your health care provider.  Hepatitis A vaccine.** / Consult your health care provider.  Hepatitis B vaccine.** / Consult your health care provider.  Haemophilus influenzae  type b (Hib) vaccine.** / Consult your health care provider. Ages 65 years and over  Blood pressure check.** / Every 1 to 2 years.  Lipid and cholesterol check.** / Every 5 years beginning at age 67 years.  Lung cancer screening. / Every year if you are aged 55-80 years and have a 30-pack-year history of smoking and currently smoke or have quit within the past 15 years. Yearly screening is stopped once you have quit smoking for at least 15 years or develop a health problem that would prevent you from having lung cancer treatment.  Clinical breast exam.** / Every year after age 74 years.  BRCA-related cancer risk assessment.** / For women who have family members with a BRCA-related cancer (breast, ovarian, tubal, or peritoneal cancers).  Mammogram.** / Every year beginning at age 38 years and continuing for as long as you are in good health. Consult with your health care provider.  Pap test.** / Every 3 years starting at age 60 years through age 96 or 20 years with 3 consecutive normal Pap tests. Testing can be stopped between 65 and 70 years with 3 consecutive normal Pap tests and no abnormal Pap or HPV tests in the past 10 years.  HPV screening.** / Every 3 years from ages 61 years through ages 54 or 74 years with a history of 3 consecutive normal Pap tests. Testing can be stopped between 65 and 70 years with 3 consecutive normal Pap tests and no abnormal Pap or HPV tests in the past 10 years.  Fecal occult blood test (FOBT) of stool. / Every year beginning at age 77 years and continuing until age 53 years. You may not need to do this test if you get a colonoscopy every 10 years.  Flexible sigmoidoscopy or colonoscopy.** / Every 5 years for a flexible sigmoidoscopy or every 10 years for a colonoscopy beginning at age 72 years and continuing until age 20 years.  Hepatitis C blood test.** / For all people born from [birth year] through 1965 and any individual with known risks for hepatitis  C.  Osteoporosis screening.** / A one-time screening for women ages 83 years and over and women at risk for fractures or osteoporosis.  Skin self-exam. / Monthly.  Influenza vaccine. / Every year.  Tetanus, diphtheria, and acellular pertussis (Tdap/Td) vaccine.** / 1 dose of Td every 10 years.  Varicella vaccine.** / Consult your health care provider.  Zoster vaccine.** /  1 dose for adults aged 64 years or older.  Pneumococcal 13-valent conjugate (PCV13) vaccine.** / Consult your health care provider.  Pneumococcal polysaccharide (PPSV23) vaccine.** / 1 dose for all adults aged 52 years and older.  Meningococcal vaccine.** / Consult your health care provider.  Hepatitis A vaccine.** / Consult your health care provider.  Hepatitis B vaccine.** / Consult your health care provider.  Haemophilus influenzae type b (Hib) vaccine.** / Consult your health care provider. ** Family history and personal history of risk and conditions may change your health care provider's recommendations. Document Released: 09/27/2001 Document Revised: 12/16/2013 Document Reviewed: 12/27/2010 [facility] Patient Information 2015 [place], [place]. This information is not intended to replace advice given to you by your health care provider. Make sure you discuss any questions you have with your health care provider.

## 2014-04-23 NOTE — Progress Notes (Signed)
Patient ID: Amy Chambers, female   DOB: 1928/05/25, 80 y.o.   MRN: [id number] Subjective:    Amy Chambers is a 80 y.o. female who presents for Medicare Initial Wellness Visit and review DEXA  Preventive Screening-Counseling & Management  Tobacco History  Smoking status  . Former Smoker  . Quit date: 08/15/1994  Smokeless tobacco  . Former Neurosurgeon  . Types: Chew  . Quit date: 08/15/1998     Current Problems (verified) Patient Active Problem List   Diagnosis Date Noted  . HTN (hypertension) 12/01/2010  . Hyperlipemia 12/01/2010  . Hypothyroidism 12/01/2010  . Osteopenia 12/01/2010  . GERD (gastroesophageal reflux disease) 12/01/2010    Medications Prior to Visit Current Outpatient Prescriptions on File Prior to Visit  Medication Sig Dispense Refill  . albuterol (PROVENTIL HFA;VENTOLIN HFA) 108 (90 BASE) MCG/ACT inhaler Inhale 2 puffs into the lungs every 6 (six) hours as needed for wheezing.  1 Inhaler  3  . aspirin 81 MG tablet Take 81 mg by mouth daily.      . benazepril (LOTENSIN) 20 MG tablet Take 1 tablet (20 mg total) by mouth daily.  30 tablet  5  . calcium-vitamin D 250-100 MG-UNIT per tablet Take 1 tablet by mouth 2 (two) times daily.       . Cholecalciferol (VITAMIN D) 2000 UNITS CAPS Take by mouth. 1 daily        . levothyroxine (SYNTHROID, LEVOTHROID) 75 MCG tablet Take 1 tablet (75 mcg total) by mouth daily.  30 tablet  5  . omeprazole (PRILOSEC) 20 MG capsule Take 1 capsule (20 mg total) by mouth daily.  30 capsule  5   No current facility-administered medications on file prior to visit.    Current Medications (verified) Current Outpatient Prescriptions  Medication Sig Dispense Refill  . albuterol (PROVENTIL HFA;VENTOLIN HFA) 108 (90 BASE) MCG/ACT inhaler Inhale 2 puffs into the lungs every 6 (six) hours as needed for wheezing.  1 Inhaler  3  . aspirin 81 MG tablet Take 81 mg by mouth daily.      . benazepril (LOTENSIN) 20 MG tablet Take 1 tablet (20 mg total)  by mouth daily.  30 tablet  5  . calcium-vitamin D 250-100 MG-UNIT per tablet Take 1 tablet by mouth 2 (two) times daily.       . Cholecalciferol (VITAMIN D) 2000 UNITS CAPS Take by mouth. 1 daily        . GARLIC PO Take 1 tablet by mouth daily.      Marland Kitchen levothyroxine (SYNTHROID, LEVOTHROID) 75 MCG tablet Take 1 tablet (75 mcg total) by mouth daily.  30 tablet  5  . omeprazole (PRILOSEC) 20 MG capsule Take 1 capsule (20 mg total) by mouth daily.  30 capsule  5   No current facility-administered medications for this visit.     Allergies (verified) Crestor and Niaspan   PAST HISTORY  Family History Family History  Problem Relation Age of Onset  . Diabetes Mother   . Cancer Brother   . Tuberculosis Father   . Early death Sister   . Early death Brother   . COPD Brother   . Emphysema Brother   . Early death Sister   . COPD Sister   . Emphysema Sister   . Aneurysm Sister   . Stroke Sister   . Cancer Sister     Social History History  Substance Use Topics  . Smoking status: Former Smoker    Quit date: 08/15/1994  .  Smokeless tobacco: Former Neurosurgeon    Types: Chew    Quit date: 08/15/1998  . Alcohol Use: No     Are there smokers in your home (other than you)? Yes  Risk Factors Current exercise habits: The patient does not participate in regular exercise at present. patient is very active - canning, cutting wood and gardening  Dietary issues discussed: none   Cardiac risk factors: advanced age (older than 23 for men, 41 for women), dyslipidemia, family history of premature cardiovascular disease, hypertension and smoking/ tobacco exposure.  Depression Screen (Note: if answer to either of the following is "Yes", a more complete depression screening is indicated)   Over the past 2 weeks, have you felt down, depressed or hopeless? No  Over the past 2 weeks, have you felt little interest or pleasure in doing things? No  Have you lost interest or pleasure in daily life? No  Do  you often feel hopeless? No  Do you cry easily over simple problems? No  Activities of Daily Living In your present state of health, do you have any difficulty performing the following activities?:  Driving? No Managing money?  No Feeding yourself? No Getting from bed to chair? No  Climbing a flight of stairs? No Preparing food and eating?: No Bathing or showering? No Getting dressed: No Getting to the toilet? No Using the toilet:No Moving around from place to place: No In the past year have you fallen or had a near fall?:No   Are you sexually active?  Yes  Do you have more than one partner?  No  Hearing Difficulties: No Do you often ask people to speak up or repeat themselves? No Do you experience ringing or noises in your ears? No Do you have difficulty understanding soft or whispered voices? No   Do you feel that you have a problem with memory? No  Do you often misplace items? No  Do you feel safe at home?  Yes  Cognitive Testing  Alert? Yes  Normal Appearance?Yes  Oriented to person? Yes  Place? Yes   Time? Yes  Recall of three objects?  Yes  Can perform simple calculations? Yes  Displays appropriate judgment?Yes  Can read the correct time from a watch face?Yes   Advanced Directives have been discussed with the patient? Yes  List the Names of Other Physician/Practitioners you currently use: 1.  Smitty Cords - OD / eye  Indicate any recent Medical Services you may have received from other than [facility] providers in the past year (date may be approximate).  Immunization History  Administered Date(s) Administered  . Influenza Whole 05/13/2010  . Influenza,inj,Quad PF,36+ Mos 06/07/2013  . Pneumococcal Conjugate-13 06/07/2013  . Pneumococcal Polysaccharide-23 04/15/2010  . Td 08/15/2001  . Tdap 01/30/2012   Last eye exam 03/2014 - requested copy of visit  Screening Tests Health Maintenance  Topic Date Due  . Influenza Vaccine  03/15/2014  . Colonoscopy   08/15/2014 (Originally 04/01/1980)  . Zostavax  08/15/2014 (Originally 04/01/1990)  . Tetanus/tdap  01/29/2022  . Pneumococcal Polysaccharide Vaccine Age 46 And Over  Completed    All answers were reviewed with the patient and necessary referrals were made:  Henrene Pastor, [facility]   04/23/2014   History reviewed: allergies, current medications, past family history, past medical history, past social history, past surgical history and problem list    Objective:    Body mass index is 24.15 kg/(m^2). BP 136/68  Pulse 78  Ht  (1.473 m)  Wt  115 lb 8 oz (52.39 kg)  BMI 24.15 kg/m2   Last Vitamin D Result:  57 (01/2013) Last GFR Result:  61 (02/12/2014)   Calcium Assessment Calcium Intake  # of servings/day  Calcium mg  Milk (8 oz) 1  x  300  =   Yogurt (4 oz) 0 x  200 = 0  Cheese (1 oz) 0 x  200 = 0  Other Calcium sources     Ca supplement Calcium  BID =    Estimated calcium intake per day     DEXA Results Date of Test T-Score for AP Spine L1-L4 T-Score for Total Left Hip T-Score for Total Right Hip  04/23/2014 -1.2 -0.6 -0.8  02/01/2012 -0.8 -0.6 -0.6  01/28/2009 -1.0 -0.7 -0.7  12/25/2006 -1.7 -0.8 -0.8   FRAX 10 year estimate: Total FX risk:  15%  (consider medication if >/= 20%) Hip FX risk:  4.4%  (consider medication if >/= 3%)    Assessment:     Initial Annual Medicare Wellness Visit Osteopenia with decreased BMD     Plan:     During the course of the visit the patient was educated and counseled about appropriate screening and preventive services including:    Pneumococcal vaccine - completed  Influenza vaccine - plan to get November 2015  Hepatitis B vaccine - declined  Td vaccine - UTD  Screening mammography - declined  Bone densitometry screening - completed today  Colorectal cancer screening - declined  Diabetes screening - UTD  Glaucoma screening - requested records  Advanced directives: patient given  [facility] Connections Packet  Discussed restarting alendronate or starting Evista for osteopenia.  Patient refused pharmacotherapy for osteopenia.  She understands she is at risk for fractures.  recommend increase calcium to  daily through supplementation or diet.   recommend weight bearing exercise - 30 minutes at least 4 days per week.    Counseled and educated about fall risk and prevention.  Recheck DEXA:  2 years  Time spent counseling patient:  45 minutes   Patient Instructions (the written plan) was given to the patient.  Medicare Attestation I have personally reviewed: The patient's medical and social history Their use of alcohol, tobacco or illicit drugs Their current medications and supplements The patient's functional ability including ADLs,fall risks, home safety risks, cognitive, and hearing and visual impairment Diet and physical activities Evidence for depression or mood disorders  The patient's weight, height, BMI, and BP/HR have been recorded in the chart.  I have made referrals, counseling, and provided education to the patient based on review of the above and I have provided the patient with a written personalized care plan for preventive services.     Henrene Pastor, [facility]   04/23/2014

## 2014-06-16 ENCOUNTER — Ambulatory Visit (INDEPENDENT_AMBULATORY_CARE_PROVIDER_SITE_OTHER): Payer: Medicare Other | Admitting: Family Medicine

## 2014-06-16 ENCOUNTER — Encounter: Payer: Self-pay | Admitting: Nurse Practitioner

## 2014-06-16 ENCOUNTER — Ambulatory Visit (INDEPENDENT_AMBULATORY_CARE_PROVIDER_SITE_OTHER): Payer: Medicare Other | Admitting: Nurse Practitioner

## 2014-06-16 VITALS — BP 135/68 | HR 70 | Temp 97.0°F | Ht <= 58 in | Wt 116.4 lb

## 2014-06-16 DIAGNOSIS — E785 Hyperlipidemia, unspecified: Secondary | ICD-10-CM

## 2014-06-16 DIAGNOSIS — M8580 Other specified disorders of bone density and structure, unspecified site: Secondary | ICD-10-CM

## 2014-06-16 DIAGNOSIS — I10 Essential (primary) hypertension: Secondary | ICD-10-CM

## 2014-06-16 DIAGNOSIS — E039 Hypothyroidism, unspecified: Secondary | ICD-10-CM

## 2014-06-16 DIAGNOSIS — Z23 Encounter for immunization: Secondary | ICD-10-CM

## 2014-06-16 NOTE — Patient Instructions (Addendum)

## 2014-06-16 NOTE — Progress Notes (Signed)
   Subjective:    Patient ID: Amy Chambers, female    DOB: 03/17/1928, 80 y.o.   MRN: [id number]  Patient here today for follow up of chronic medical problems. No acute complaints today.   Hypertension This is a chronic problem. The current episode started more than 1 year ago. The problem has been gradually improving since onset. The problem is uncontrolled. Pertinent negatives include no chest pain or peripheral edema. Risk factors for coronary artery disease include sedentary lifestyle, dyslipidemia and post-menopausal state. Past treatments include ACE inhibitors. The current treatment provides moderate improvement.  Gastrophageal Reflux She complains of coughing and a sore throat. She reports no abdominal pain, no chest pain or no choking. This is a chronic problem. The current episode started more than 1 year ago. The problem occurs occasionally. The problem has been waxing and waning. The symptoms are aggravated by certain foods. Risk factors include smoking/tobacco exposure. She has tried a PPI for the symptoms. The treatment provided moderate relief.  Hyperlipidemia This is a chronic problem. The current episode started more than 1 year ago. The problem is controlled. Exacerbating diseases include hypothyroidism. Pertinent negatives include no chest pain or leg pain. Current antihyperlipidemic treatment includes diet change (garlic PO.). The current treatment provides moderate improvement of lipids. Risk factors for coronary artery disease include hypertension, post-menopausal and dyslipidemia.  Osteopenia.  calcium and vitamin d OTC daily- no side effects  Review of Systems  Constitutional: Negative.   HENT: Positive for congestion and sore throat.   Eyes: Negative.   Respiratory: Positive for cough. Negative for choking.   Cardiovascular: Negative for chest pain.  Gastrointestinal: Negative for abdominal pain.  Endocrine: Negative.   Genitourinary: Negative.   Allergic/Immunologic:  Negative.   Neurological: Negative.   Hematological: Negative.   Psychiatric/Behavioral: Negative.        Objective:   Physical Exam  Constitutional: She is oriented to person, place, and time. She appears well-developed.  HENT:  Head: Normocephalic.  Eyes: Conjunctivae are normal. Pupils are equal, round, and reactive to light.  Neck: Normal range of motion.  Cardiovascular: Normal rate and regular rhythm.   Pulmonary/Chest: Effort normal and breath sounds normal.  Abdominal: Soft.  Musculoskeletal: Normal range of motion.  Neurological: She is alert and oriented to person, place, and time. She has normal reflexes.  Skin: Skin is warm.  Psychiatric: She has a normal mood and affect.    BP 135/68 mmHg  Pulse 70  Temp(Src) 97 F (36.1 C) (Oral)  Ht 4' 10" (1.473 m)  Wt 116 lb 6.4 oz (52.799 kg)  BMI 24.33 kg/m2       Assessment & Plan:    1. Essential hypertension Low Na + diet - CMP14+EGFR  2. Hyperlipemia Watch fats in diet - NMR, lipoprofile  3. Hypothyroidism, unspecified hypothyroidism type - Thyroid Panel With TSH  4. Osteopenia Weight bearing exercises   Flu shot today Labs pending Health maintenance reviewed Diet and exercise encouraged Continue all meds Follow up  In 3 months   [name] [name], [name]     

## 2014-06-17 LAB — THYROID PANEL WITH TSH
FREE THYROXINE INDEX: 3.3 (ref 1.2–4.9)
T3 UPTAKE RATIO: 29 % (ref 24–39)
T4 TOTAL: 11.5 ug/dL (ref 4.5–12.0)
TSH: 0.854 u[IU]/mL (ref 0.450–4.500)

## 2014-06-17 LAB — CMP14+EGFR
ALBUMIN: 4 g/dL (ref 3.5–4.7)
ALT: 9 IU/L (ref 0–32)
AST: 14 IU/L (ref 0–40)
Albumin/Globulin Ratio: 1.8 (ref 1.1–2.5)
Alkaline Phosphatase: 74 IU/L (ref 39–117)
BUN/Creatinine Ratio: 27 — ABNORMAL HIGH (ref 11–26)
BUN: 21 mg/dL (ref 8–27)
CALCIUM: 9.5 mg/dL (ref 8.7–10.3)
CHLORIDE: 99 mmol/L (ref 97–108)
CO2: 26 mmol/L (ref 18–29)
Creatinine, Ser: 0.79 mg/dL (ref 0.57–1.00)
GFR calc Af Amer: 79 mL/min/{1.73_m2} (ref >59)
GFR calc non Af Amer: 69 mL/min/{1.73_m2} (ref >59)
GLUCOSE: 88 mg/dL (ref 65–99)
Globulin, Total: 2.2 g/dL (ref 1.5–4.5)
POTASSIUM: 4.8 mmol/L (ref 3.5–5.2)
Sodium: 138 mmol/L (ref 134–144)
TOTAL PROTEIN: 6.2 g/dL (ref 6.0–8.5)
Total Bilirubin: 0.3 mg/dL (ref 0.0–1.2)

## 2014-06-17 LAB — NMR, LIPOPROFILE
Cholesterol: 150 mg/dL (ref 100–199)
HDL CHOLESTEROL BY NMR: 39 mg/dL — AB (ref >39)
HDL Particle Number: 28.5 umol/L — ABNORMAL LOW (ref >=30.5)
LDL Particle Number: 1373 nmol/L — ABNORMAL HIGH (ref <1000)
LDL Size: 20.2 nm (ref >20.5)
LDL-C: 92 mg/dL (ref 0–99)
LP-IR SCORE: 51 — AB (ref <=45)
SMALL LDL PARTICLE NUMBER: 806 nmol/L — AB (ref <=527)
Triglycerides by NMR: 96 mg/dL (ref 0–149)

## 2014-06-18 ENCOUNTER — Telehealth: Payer: Self-pay | Admitting: *Deleted

## 2014-06-18 NOTE — Telephone Encounter (Signed)
-----   [facility]-Margaret [name], [name] sent at 06/17/2014 11:25 AM EST ----- Kidney and liver function stable Thyroid normal LDL particle numbers are increasing Continue current meds- low fat diet and exercise and recheck in 3 months

## 2014-08-18 ENCOUNTER — Other Ambulatory Visit: Payer: Self-pay | Admitting: Nurse Practitioner

## 2014-08-25 ENCOUNTER — Encounter: Payer: Self-pay | Admitting: *Deleted

## 2014-10-16 ENCOUNTER — Ambulatory Visit (INDEPENDENT_AMBULATORY_CARE_PROVIDER_SITE_OTHER): Payer: Medicare Other | Admitting: Nurse Practitioner

## 2014-10-16 ENCOUNTER — Ambulatory Visit (INDEPENDENT_AMBULATORY_CARE_PROVIDER_SITE_OTHER): Payer: Medicare Other

## 2014-10-16 ENCOUNTER — Encounter: Payer: Self-pay | Admitting: Nurse Practitioner

## 2014-10-16 VITALS — BP 114/63 | HR 72 | Temp 97.0°F | Ht <= 58 in | Wt 114.0 lb

## 2014-10-16 DIAGNOSIS — M8580 Other specified disorders of bone density and structure, unspecified site: Secondary | ICD-10-CM | POA: Diagnosis not present

## 2014-10-16 DIAGNOSIS — K219 Gastro-esophageal reflux disease without esophagitis: Secondary | ICD-10-CM | POA: Diagnosis not present

## 2014-10-16 DIAGNOSIS — E039 Hypothyroidism, unspecified: Secondary | ICD-10-CM | POA: Diagnosis not present

## 2014-10-16 DIAGNOSIS — R0602 Shortness of breath: Secondary | ICD-10-CM

## 2014-10-16 DIAGNOSIS — I10 Essential (primary) hypertension: Secondary | ICD-10-CM | POA: Diagnosis not present

## 2014-10-16 DIAGNOSIS — J410 Simple chronic bronchitis: Secondary | ICD-10-CM

## 2014-10-16 DIAGNOSIS — E785 Hyperlipidemia, unspecified: Secondary | ICD-10-CM

## 2014-10-16 MED ORDER — BENAZEPRIL HCL 20 MG PO TABS: 20.0000 mg | ORAL_TABLET | Freq: Every day | ORAL | Status: DC

## 2014-10-16 MED ORDER — LEVOTHYROXINE SODIUM 75 MCG PO TABS: 75.0000 ug | ORAL_TABLET | Freq: Every day | ORAL | Status: DC

## 2014-10-16 MED ORDER — BUDESONIDE-FORMOTEROL FUMARATE 80-4.5 MCG/ACT IN AERO: 2.0000 | INHALATION_SPRAY | Freq: Two times a day (BID) | RESPIRATORY_TRACT | Status: DC

## 2014-10-16 MED ORDER — ALBUTEROL SULFATE HFA 108 (90 BASE) MCG/ACT IN AERS: 2.0000 | INHALATION_SPRAY | Freq: Four times a day (QID) | RESPIRATORY_TRACT | Status: DC | PRN

## 2014-10-16 NOTE — Progress Notes (Addendum)
Subjective:    Patient ID: Amy Chambers, female    DOB: December 04, 1927, 81 y.o.   MRN: [id number]  Patient here today for follow up of chronic medical problems. She is c/o feeling sob and has been using albuterol inhaler >5-6X a week and sometimes daily. She says it really helps a lot when she uses it. She says that she gets to coughing and the albuterol helps with that.  Hypertension This is a chronic problem. The current episode started more than 1 year ago. The problem has been gradually improving since onset. The problem is controlled. Associated symptoms include shortness of breath. Pertinent negatives include no chest pain or peripheral edema. Risk factors for coronary artery disease include sedentary lifestyle, dyslipidemia and post-menopausal state. Past treatments include ACE inhibitors. The current treatment provides moderate improvement.  Gastrophageal Reflux She complains of coughing. She reports no abdominal pain, no chest pain or no choking. This is a chronic problem. The current episode started more than 1 year ago. The problem occurs occasionally. The problem has been waxing and waning. The symptoms are aggravated by certain foods. Risk factors include smoking/tobacco exposure. She has tried a PPI for the symptoms. The treatment provided moderate relief.  Hyperlipidemia This is a chronic problem. The current episode started more than 1 year ago. The problem is controlled. Exacerbating diseases include hypothyroidism. Associated symptoms include shortness of breath. Pertinent negatives include no chest pain or leg pain. Current antihyperlipidemic treatment includes diet change (garlic PO.). The current treatment provides moderate improvement of lipids. Risk factors for coronary artery disease include hypertension, post-menopausal and dyslipidemia.  Osteopenia.  calcium and vitamin d OTC daily- no side effects  Review of Systems  Constitutional: Negative.   HENT: Negative.   Eyes:  Negative.   Respiratory: Positive for cough and shortness of breath. Negative for choking.   Cardiovascular: Negative for chest pain.  Gastrointestinal: Negative for abdominal pain.  Endocrine: Negative.   Genitourinary: Negative.   Allergic/Immunologic: Negative.   Neurological: Negative.   Hematological: Negative.   Psychiatric/Behavioral: Negative.        Objective:   Physical Exam  Constitutional: She is oriented to person, place, and time. She appears well-developed.  HENT:  Head: Normocephalic.  Eyes: Conjunctivae are normal. Pupils are equal, round, and reactive to light.  Neck: Normal range of motion.  Cardiovascular: Normal rate and regular rhythm.   Pulmonary/Chest: Effort normal and breath sounds normal.  Abdominal: Soft.  Musculoskeletal: Normal range of motion.  Neurological: She is alert and oriented to person, place, and time. She has normal reflexes.  Skin: Skin is warm.  Psychiatric: She has a normal mood and affect.    BP 114/63 mmHg  Pulse 72  Temp(Src) 97 F (36.1 C) (Oral)  Ht 4' 10"  (1.473 m)  Wt 114 lb (51.71 kg)  BMI 23.83 kg/m2  EKG- NSR-[name] Hassell Done, [name]  Chest x ray chronic bronchitic changes-Preliminary reading by Ronnald Collum, [name]  [facility]      Assessment & Plan:   1. Essential hypertension Do not ad slat to diet - benazepril (LOTENSIN) 20 MG tablet; Take 1 tablet (20 mg total) by mouth daily.  Dispense: 30 tablet; Refill: 5  2. Gastroesophageal reflux disease without esophagitis Avoid spicy and fatty foods  3. Hypothyroidism, unspecified hypothyroidism type - levothyroxine (SYNTHROID, LEVOTHROID) 75 MCG tablet; Take 1 tablet (75 mcg total) by mouth daily.  Dispense: 30 tablet; Refill: 5  4. Osteopenia Weight bearing exercises  5. Hyperlipemia Low fat diet  6. SOB (shortness of breath) - DG Chest 2 View; Future - EKG 12-Lead  7. Simple chronic bronchitis Added symbicort  Only use albuterol as rescue  -  budesonide-formoterol (SYMBICORT) 80-4.5 MCG/ACT inhaler; Inhale 2 puffs into the lungs 2 (two) times daily.  Dispense: 1 Inhaler; Refill: 3 - albuterol (PROVENTIL HFA;VENTOLIN HFA) 108 (90 BASE) MCG/ACT inhaler; Inhale 2 puffs into the lungs every 6 (six) hours as needed for wheezing.  Dispense: 1 Inhaler; Refill: 3  Orders Placed This Encounter  Procedures  . DG Chest 2 View    Standing Status: Future     Number of Occurrences: 1     Standing Expiration Date: 12/16/2015    Order Specific Question:  Reason for Exam (SYMPTOM  OR DIAGNOSIS REQUIRED)    Answer:  SOB    Order Specific Question:  Preferred imaging location?    Answer:  Internal  . CMP14+EGFR  . NMR, lipoprofile  . Thyroid Panel With TSH  . EKG 12-Lead      Labs pending Health maintenance reviewed Diet and exercise encouraged Continue all meds Follow up  In 55month   MPorter [name]

## 2014-10-16 NOTE — Patient Instructions (Signed)

## 2014-10-16 NOTE — Addendum Note (Signed)
Addended by: [name], [name] on: 10/16/2014 11:30 AM   Modules accepted: Orders

## 2014-10-17 LAB — CMP14+EGFR
ALT: 9 IU/L (ref 0–32)
AST: 17 IU/L (ref 0–40)
Albumin/Globulin Ratio: 1.6 (ref 1.1–2.5)
Albumin: 4.1 g/dL (ref 3.5–4.7)
Alkaline Phosphatase: 68 IU/L (ref 39–117)
BILIRUBIN TOTAL: 0.3 mg/dL (ref 0.0–1.2)
BUN/Creatinine Ratio: 29 — ABNORMAL HIGH (ref 11–26)
BUN: 23 mg/dL (ref 8–27)
CHLORIDE: 99 mmol/L (ref 97–108)
CO2: 23 mmol/L (ref 18–29)
Calcium: 9.8 mg/dL (ref 8.7–10.3)
Creatinine, Ser: 0.78 mg/dL (ref 0.57–1.00)
GFR calc non Af Amer: 70 mL/min/{1.73_m2} (ref >59)
GFR, EST AFRICAN AMERICAN: 81 mL/min/{1.73_m2} (ref >59)
GLUCOSE: 98 mg/dL (ref 65–99)
Globulin, Total: 2.6 g/dL (ref 1.5–4.5)
POTASSIUM: 4.6 mmol/L (ref 3.5–5.2)
Sodium: 138 mmol/L (ref 134–144)
TOTAL PROTEIN: 6.7 g/dL (ref 6.0–8.5)

## 2014-10-17 LAB — NMR, LIPOPROFILE
Cholesterol: 152 mg/dL (ref 100–199)
HDL Cholesterol by NMR: 33 mg/dL — ABNORMAL LOW (ref >39)
HDL Particle Number: 24.6 umol/L — ABNORMAL LOW (ref >=30.5)
LDL PARTICLE NUMBER: 1245 nmol/L — AB (ref <1000)
LDL Size: 20.8 nm (ref >20.5)
LDL-C: 75 mg/dL (ref 0–99)
LP-IR SCORE: 63 — AB (ref <=45)
Small LDL Particle Number: 685 nmol/L — ABNORMAL HIGH (ref <=527)
Triglycerides by NMR: 220 mg/dL — ABNORMAL HIGH (ref 0–149)

## 2014-10-17 LAB — THYROID PANEL WITH TSH
Free Thyroxine Index: 3.1 (ref 1.2–4.9)
T3 Uptake Ratio: 28 % (ref 24–39)
T4, Total: 11.2 ug/dL (ref 4.5–12.0)
TSH: 0.958 u[IU]/mL (ref 0.450–4.500)

## 2014-11-13 ENCOUNTER — Encounter: Payer: Self-pay | Admitting: *Deleted

## 2015-01-05 ENCOUNTER — Other Ambulatory Visit (INDEPENDENT_AMBULATORY_CARE_PROVIDER_SITE_OTHER): Payer: Medicare Other

## 2015-01-05 DIAGNOSIS — R112 Nausea with vomiting, unspecified: Secondary | ICD-10-CM

## 2015-01-05 DIAGNOSIS — R300 Dysuria: Secondary | ICD-10-CM | POA: Diagnosis not present

## 2015-01-05 LAB — POCT CBC
Granulocyte percent: 74.7 %G (ref 37–80)
HEMATOCRIT: 37.9 % (ref 37.7–47.9)
Hemoglobin: 12.5 g/dL (ref 12.2–16.2)
LYMPH, POC: 1.8 (ref 0.6–3.4)
MCH, POC: 29.6 pg (ref 27–31.2)
MCHC: 32.8 g/dL (ref 31.8–35.4)
MCV: 90.2 fL (ref 80–97)
MPV: 7.8 fL (ref 0–99.8)
PLATELET COUNT, POC: 247 10*3/uL (ref 142–424)
POC Granulocyte: 6.9 (ref 2–6.9)
POC LYMPH PERCENT: 19.1 %L (ref 10–50)
RBC: 4.2 M/uL (ref 4.04–5.48)
RDW, POC: 13.6 %
WBC: 9.3 10*3/uL (ref 4.6–10.2)

## 2015-01-05 LAB — POCT URINALYSIS DIPSTICK
BILIRUBIN UA: NEGATIVE
GLUCOSE UA: NEGATIVE
Nitrite, UA: NEGATIVE
PH UA: 5
Protein, UA: NEGATIVE
RBC UA: NEGATIVE
Spec Grav, UA: 1.02
Urobilinogen, UA: NEGATIVE

## 2015-01-05 LAB — POCT UA - MICROSCOPIC ONLY
Bacteria, U Microscopic: NEGATIVE
CASTS, UR, LPF, POC: NEGATIVE
Crystals, Ur, HPF, POC: NEGATIVE
MUCUS UA: NEGATIVE
RBC, URINE, MICROSCOPIC: NEGATIVE

## 2015-01-05 NOTE — Addendum Note (Signed)
Addended by: Prescott GumLAND, Justyn Langham M on: 01/05/2015 01:18 PM   Modules accepted: Orders

## 2015-01-05 NOTE — Progress Notes (Signed)
Lab only 

## 2015-01-06 LAB — BMP8+EGFR
BUN / CREAT RATIO: 23 (ref 11–26)
BUN: 18 mg/dL (ref 8–27)
CHLORIDE: 97 mmol/L (ref 97–108)
CO2: 25 mmol/L (ref 18–29)
CREATININE: 0.79 mg/dL (ref 0.57–1.00)
Calcium: 9.4 mg/dL (ref 8.7–10.3)
GFR, EST AFRICAN AMERICAN: 79 mL/min/{1.73_m2} (ref >59)
GFR, EST NON AFRICAN AMERICAN: 69 mL/min/{1.73_m2} (ref >59)
Glucose: 66 mg/dL (ref 65–99)
Potassium: 5.4 mmol/L — ABNORMAL HIGH (ref 3.5–5.2)
SODIUM: 138 mmol/L (ref 134–144)

## 2015-01-06 LAB — HEPATIC FUNCTION PANEL
ALT: 23 IU/L (ref 0–32)
AST: 23 IU/L (ref 0–40)
Albumin: 3.9 g/dL (ref 3.5–4.7)
Alkaline Phosphatase: 68 IU/L (ref 39–117)
Bilirubin Total: 0.3 mg/dL (ref 0.0–1.2)
Bilirubin, Direct: 0.08 mg/dL (ref 0.00–0.40)
Total Protein: 6.3 g/dL (ref 6.0–8.5)

## 2015-01-07 LAB — URINE CULTURE

## 2015-01-08 ENCOUNTER — Ambulatory Visit (INDEPENDENT_AMBULATORY_CARE_PROVIDER_SITE_OTHER): Payer: Medicare Other | Admitting: Nurse Practitioner

## 2015-01-08 ENCOUNTER — Encounter: Payer: Self-pay | Admitting: Nurse Practitioner

## 2015-01-08 VITALS — BP 120/66 | HR 76 | Temp 97.0°F | Ht <= 58 in | Wt 111.0 lb

## 2015-01-08 DIAGNOSIS — R110 Nausea: Secondary | ICD-10-CM | POA: Diagnosis not present

## 2015-01-08 DIAGNOSIS — R1084 Generalized abdominal pain: Secondary | ICD-10-CM

## 2015-01-08 LAB — POCT CBC
Granulocyte percent: 78 %G (ref 37–80)
HEMATOCRIT: 37.4 % — AB (ref 37.7–47.9)
Hemoglobin: 12.1 g/dL — AB (ref 12.2–16.2)
Lymph, poc: 1.5 (ref 0.6–3.4)
MCH, POC: 28.9 pg (ref 27–31.2)
MCHC: 32.3 g/dL (ref 31.8–35.4)
MCV: 89.6 fL (ref 80–97)
MPV: 7.8 fL (ref 0–99.8)
POC Granulocyte: 7.1 — AB (ref 2–6.9)
POC LYMPH %: 16.3 % (ref 10–50)
Platelet Count, POC: 253 10*3/uL (ref 142–424)
RBC: 4.17 M/uL (ref 4.04–5.48)
RDW, POC: 13.5 %
WBC: 9.1 10*3/uL (ref 4.6–10.2)

## 2015-01-08 MED ORDER — ONDANSETRON HCL 4 MG PO TABS: 4.0000 mg | ORAL_TABLET | Freq: Three times a day (TID) | ORAL | Status: DC | PRN

## 2015-01-08 NOTE — Progress Notes (Signed)
Subjective:    Patient ID: Amy Chambers, female    DOB: 12/05/1927, 81 y.o.   MRN: [id number]  HPI Patient in c/o abdominal- started months ago- but got worse the last week- diarrhea, took imodium which stopped it. Denies any fever.  No pain currently. Has been nauseated since yesterday.    Review of Systems  Constitutional: Negative.   HENT: Negative.   Respiratory: Negative.   Cardiovascular: Negative.   Gastrointestinal: Positive for nausea. Negative for vomiting, diarrhea and constipation.  Genitourinary: Negative.   Psychiatric/Behavioral: Negative.        Objective:   Physical Exam  Constitutional: She is oriented to person, place, and time. She appears well-developed and well-nourished.  Cardiovascular: Normal rate, regular rhythm and normal heart sounds.   Pulmonary/Chest: Effort normal and breath sounds normal.  Abdominal: Soft. Bowel sounds are normal. She exhibits no distension and no mass. There is no tenderness. There is no rebound and no guarding.  Neurological: She is alert and oriented to person, place, and time.  Skin: Skin is warm and dry.  Psychiatric: She has a normal mood and affect. Her behavior is normal. Judgment and thought content normal.    BP 120/66 mmHg  Pulse 76  Temp(Src) 97 F (36.1 C) (Oral)  Ht 4' 10" (1.473 m)  Wt 111 lb (50.349 kg)  BMI 23.21 kg/m2       Assessment & Plan:  1. Nausea First 24 Hours-Clear liquids  popsicles  Jello  gatorade  Sprite Second 24 hours-Add Full liquids ( Liquids you cant see through) Third 24 hours- Bland diet ( foods that are baked or broiled)  *avoiding fried foods and highly spiced foods* During these 3 days  Avoid milk, cheese, ice cream or any other dairy products  Avoid caffeine- REMEMBER Mt. Dew and Mello Yellow contain lots of caffeine You should eat and drink in  Frequent small volumes If no improvement in symptoms or worsen in 2-3 days should RETRUN TO OFFICE or go to ER!    -  ondansetron (ZOFRAN) 4 MG tablet; Take 1 tablet (4 mg total) by mouth every 8 (eight) hours as needed for nausea or vomiting.  Dispense: 20 tablet; Refill: 0  2. Generalized abdominal pain Let me know if pain returns - CMP14+EGFR - Amylase - Lipase  [name] [name], [name]   

## 2015-01-08 NOTE — Addendum Note (Signed)
Addended by: [name], [name] on: 01/08/2015 12:42 PM   Modules accepted: Orders

## 2015-01-08 NOTE — Patient Instructions (Signed)

## 2015-01-09 LAB — CMP14+EGFR
ALT: 19 IU/L (ref 0–32)
AST: 25 IU/L (ref 0–40)
Albumin/Globulin Ratio: 1.7 (ref 1.1–2.5)
Albumin: 3.9 g/dL (ref 3.5–4.7)
Alkaline Phosphatase: 68 IU/L (ref 39–117)
BUN / CREAT RATIO: 22 (ref 11–26)
BUN: 19 mg/dL (ref 8–27)
Bilirubin Total: <0.2 mg/dL (ref 0.0–1.2)
CALCIUM: 9.3 mg/dL (ref 8.7–10.3)
CO2: 27 mmol/L (ref 18–29)
CREATININE: 0.86 mg/dL (ref 0.57–1.00)
Chloride: 99 mmol/L (ref 97–108)
GFR calc Af Amer: 72 mL/min/{1.73_m2} (ref >59)
GFR calc non Af Amer: 62 mL/min/{1.73_m2} (ref >59)
Globulin, Total: 2.3 g/dL (ref 1.5–4.5)
Glucose: 119 mg/dL — ABNORMAL HIGH (ref 65–99)
POTASSIUM: 4.9 mmol/L (ref 3.5–5.2)
SODIUM: 139 mmol/L (ref 134–144)
Total Protein: 6.2 g/dL (ref 6.0–8.5)

## 2015-01-09 LAB — LIPASE: Lipase: 31 U/L (ref 0–59)

## 2015-01-09 LAB — AMYLASE: AMYLASE: 79 U/L (ref 31–124)

## 2015-01-14 ENCOUNTER — Other Ambulatory Visit: Payer: Self-pay | Admitting: Nurse Practitioner

## 2015-02-15 ENCOUNTER — Other Ambulatory Visit: Payer: Self-pay | Admitting: Nurse Practitioner

## 2015-02-20 ENCOUNTER — Ambulatory Visit: Payer: Medicare Other | Admitting: Nurse Practitioner

## 2015-03-11 ENCOUNTER — Ambulatory Visit: Payer: Medicare Other | Admitting: Nurse Practitioner

## 2015-03-12 ENCOUNTER — Ambulatory Visit (INDEPENDENT_AMBULATORY_CARE_PROVIDER_SITE_OTHER): Payer: Medicare Other | Admitting: Nurse Practitioner

## 2015-03-12 ENCOUNTER — Encounter: Payer: Self-pay | Admitting: Nurse Practitioner

## 2015-03-12 VITALS — BP 111/60 | HR 61 | Temp 96.7°F | Ht <= 58 in | Wt 108.0 lb

## 2015-03-12 DIAGNOSIS — I10 Essential (primary) hypertension: Secondary | ICD-10-CM

## 2015-03-12 DIAGNOSIS — E039 Hypothyroidism, unspecified: Secondary | ICD-10-CM

## 2015-03-12 DIAGNOSIS — J449 Chronic obstructive pulmonary disease, unspecified: Secondary | ICD-10-CM | POA: Diagnosis not present

## 2015-03-12 DIAGNOSIS — M8580 Other specified disorders of bone density and structure, unspecified site: Secondary | ICD-10-CM | POA: Diagnosis not present

## 2015-03-12 DIAGNOSIS — K219 Gastro-esophageal reflux disease without esophagitis: Secondary | ICD-10-CM | POA: Diagnosis not present

## 2015-03-12 DIAGNOSIS — E785 Hyperlipidemia, unspecified: Secondary | ICD-10-CM | POA: Diagnosis not present

## 2015-03-12 MED ORDER — OMEPRAZOLE 20 MG PO CPDR: 20.0000 mg | DELAYED_RELEASE_CAPSULE | Freq: Every day | ORAL | Status: DC

## 2015-03-12 MED ORDER — LEVOTHYROXINE SODIUM 75 MCG PO TABS: 75.0000 ug | ORAL_TABLET | Freq: Every day | ORAL | Status: DC

## 2015-03-12 MED ORDER — BUDESONIDE-FORMOTEROL FUMARATE 80-4.5 MCG/ACT IN AERO: INHALATION_SPRAY | RESPIRATORY_TRACT | Status: DC

## 2015-03-12 MED ORDER — BENAZEPRIL HCL 20 MG PO TABS: 20.0000 mg | ORAL_TABLET | Freq: Every day | ORAL | Status: DC

## 2015-03-12 NOTE — Progress Notes (Signed)
Subjective:    Patient ID: Diamond Nickel, female    DOB: 25-Dec-1927, 81 y.o.   MRN: [id number]  Patient here today for follow up of chronic medical problems.   Hypertension This is a chronic problem. The current episode started more than 1 year ago. The problem has been gradually improving since onset. The problem is controlled. Associated symptoms include shortness of breath. Pertinent negatives include no chest pain or peripheral edema. Risk factors for coronary artery disease include sedentary lifestyle, dyslipidemia and post-menopausal state. Past treatments include ACE inhibitors. The current treatment provides moderate improvement.  Gastrophageal Reflux She complains of coughing. She reports no abdominal pain, no chest pain or no choking. This is a chronic problem. The current episode started more than 1 year ago. The problem occurs occasionally. The problem has been waxing and waning. The symptoms are aggravated by certain foods. Risk factors include smoking/tobacco exposure. She has tried a PPI for the symptoms. The treatment provided moderate relief.  Hyperlipidemia This is a chronic problem. The current episode started more than 1 year ago. The problem is controlled. Exacerbating diseases include hypothyroidism. Associated symptoms include shortness of breath. Pertinent negatives include no chest pain or leg pain. Current antihyperlipidemic treatment includes diet change (garlic PO.). The current treatment provides moderate improvement of lipids. Risk factors for coronary artery disease include hypertension, post-menopausal and dyslipidemia.  Osteopenia.  calcium and vitamin d OTC daily- no side effects  Review of Systems  Constitutional: Negative.   HENT: Negative.   Eyes: Negative.   Respiratory: Positive for cough and shortness of breath. Negative for choking.   Cardiovascular: Negative for chest pain.  Gastrointestinal: Negative for abdominal pain.  Endocrine: Negative.     Genitourinary: Negative.   Allergic/Immunologic: Negative.   Neurological: Negative.   Hematological: Negative.   Psychiatric/Behavioral: Negative.        Objective:   Physical Exam  Constitutional: She is oriented to person, place, and time. She appears well-developed.  HENT:  Head: Normocephalic.  Eyes: Conjunctivae are normal. Pupils are equal, round, and reactive to light.  Neck: Normal range of motion.  Cardiovascular: Normal rate and regular rhythm.   Pulmonary/Chest: Effort normal and breath sounds normal.  Abdominal: Soft.  Musculoskeletal: Normal range of motion.  Neurological: She is alert and oriented to person, place, and time. She has normal reflexes.  Skin: Skin is warm.  Psychiatric: She has a normal mood and affect.    BP 111/60 mmHg  Pulse 61  Temp(Src) 96.7 F (35.9 C) (Oral)  Ht _0  (1.473 m)  Wt 108 lb (48.988 kg)  BMI 22.58 kg/m2   Assessment & Plan:   1. Essential hypertension Do not add salt to diet - benazepril (LOTENSIN) 20 MG tablet; Take 1 tablet (20 mg total) by mouth daily.  Dispense: 30 tablet; Refill: 5 - CMP14+EGFR  2. Gastroesophageal reflux disease without esophagitis Avoid spicy foods Do not eat 2 hours prior to bedtime - omeprazole (PRILOSEC) 20 MG capsule; Take 1 capsule (20 mg total) by mouth daily.  Dispense: 30 capsule; Refill: 5  3. Hypothyroidism, unspecified hypothyroidism type - levothyroxine (SYNTHROID, LEVOTHROID) 75 MCG tablet; Take 1 tablet (75 mcg total) by mouth daily.  Dispense: 30 tablet; Refill: 5 - Thyroid Panel With TSH  4. Osteopenia Weight bearing exercises  5. 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  Dispense: 11 g; Refill: 3    Labs pending Health maintenance reviewed Diet and exercise encouraged Continue all  meds Follow up  In 3 month   [place], [name]

## 2015-03-12 NOTE — Patient Instructions (Signed)

## 2015-03-13 LAB — THYROID PANEL WITH TSH
Free Thyroxine Index: 3.1 (ref 1.2–4.9)
T3 Uptake Ratio: 27 % (ref 24–39)
T4 TOTAL: 11.5 ug/dL (ref 4.5–12.0)
TSH: 0.936 u[IU]/mL (ref 0.450–4.500)

## 2015-03-13 LAB — LIPID PANEL
CHOL/HDL RATIO: 4.1 ratio (ref 0.0–4.4)
CHOLESTEROL TOTAL: 164 mg/dL (ref 100–199)
HDL: 40 mg/dL (ref >39)
LDL CALC: 99 mg/dL (ref 0–99)
Triglycerides: 126 mg/dL (ref 0–149)
VLDL CHOLESTEROL CAL: 25 mg/dL (ref 5–40)

## 2015-03-13 LAB — CMP14+EGFR
ALT: 11 IU/L (ref 0–32)
AST: 14 IU/L (ref 0–40)
Albumin/Globulin Ratio: 1.7 (ref 1.1–2.5)
Albumin: 4.1 g/dL (ref 3.5–4.7)
Alkaline Phosphatase: 72 IU/L (ref 39–117)
BILIRUBIN TOTAL: 0.3 mg/dL (ref 0.0–1.2)
BUN / CREAT RATIO: 22 (ref 11–26)
BUN: 17 mg/dL (ref 8–27)
CHLORIDE: 98 mmol/L (ref 97–108)
CO2: 25 mmol/L (ref 18–29)
Calcium: 9.7 mg/dL (ref 8.7–10.3)
Creatinine, Ser: 0.77 mg/dL (ref 0.57–1.00)
GFR, EST AFRICAN AMERICAN: 82 mL/min/{1.73_m2} (ref >59)
GFR, EST NON AFRICAN AMERICAN: 71 mL/min/{1.73_m2} (ref >59)
Globulin, Total: 2.4 g/dL (ref 1.5–4.5)
Glucose: 89 mg/dL (ref 65–99)
POTASSIUM: 5.2 mmol/L (ref 3.5–5.2)
SODIUM: 139 mmol/L (ref 134–144)
Total Protein: 6.5 g/dL (ref 6.0–8.5)

## 2015-04-01 ENCOUNTER — Encounter: Payer: Self-pay | Admitting: *Deleted

## 2015-04-07 DIAGNOSIS — Z961 Presence of intraocular lens: Secondary | ICD-10-CM | POA: Diagnosis not present

## 2015-04-07 DIAGNOSIS — H04123 Dry eye syndrome of bilateral lacrimal glands: Secondary | ICD-10-CM | POA: Diagnosis not present

## 2015-04-07 DIAGNOSIS — H5212 Myopia, left eye: Secondary | ICD-10-CM | POA: Diagnosis not present

## 2015-04-07 DIAGNOSIS — H43813 Vitreous degeneration, bilateral: Secondary | ICD-10-CM | POA: Diagnosis not present

## 2015-06-04 ENCOUNTER — Ambulatory Visit (INDEPENDENT_AMBULATORY_CARE_PROVIDER_SITE_OTHER): Payer: Medicare Other

## 2015-06-04 DIAGNOSIS — Z23 Encounter for immunization: Secondary | ICD-10-CM | POA: Diagnosis not present

## 2015-07-28 ENCOUNTER — Ambulatory Visit: Payer: Medicare Other | Admitting: Nurse Practitioner

## 2015-08-04 ENCOUNTER — Ambulatory Visit (INDEPENDENT_AMBULATORY_CARE_PROVIDER_SITE_OTHER): Payer: Medicare Other | Admitting: Nurse Practitioner

## 2015-08-04 ENCOUNTER — Encounter: Payer: Self-pay | Admitting: Nurse Practitioner

## 2015-08-04 VITALS — BP 142/73 | HR 64 | Temp 96.8°F | Ht <= 58 in | Wt 116.0 lb

## 2015-08-04 DIAGNOSIS — E039 Hypothyroidism, unspecified: Secondary | ICD-10-CM

## 2015-08-04 DIAGNOSIS — K219 Gastro-esophageal reflux disease without esophagitis: Secondary | ICD-10-CM

## 2015-08-04 DIAGNOSIS — E785 Hyperlipidemia, unspecified: Secondary | ICD-10-CM

## 2015-08-04 DIAGNOSIS — I10 Essential (primary) hypertension: Secondary | ICD-10-CM | POA: Diagnosis not present

## 2015-08-04 DIAGNOSIS — J449 Chronic obstructive pulmonary disease, unspecified: Secondary | ICD-10-CM | POA: Diagnosis not present

## 2015-08-04 MED ORDER — BUDESONIDE-FORMOTEROL FUMARATE 80-4.5 MCG/ACT IN AERO: INHALATION_SPRAY | RESPIRATORY_TRACT | Status: DC

## 2015-08-04 MED ORDER — LEVOTHYROXINE SODIUM 75 MCG PO TABS: 75.0000 ug | ORAL_TABLET | Freq: Every day | ORAL | Status: DC

## 2015-08-04 MED ORDER — BENAZEPRIL HCL 20 MG PO TABS: 20.0000 mg | ORAL_TABLET | Freq: Every day | ORAL | Status: DC

## 2015-08-04 MED ORDER — OMEPRAZOLE 20 MG PO CPDR: 20.0000 mg | DELAYED_RELEASE_CAPSULE | Freq: Every day | ORAL | Status: DC

## 2015-08-04 NOTE — Patient Instructions (Signed)
Bone Health Bones protect organs, store calcium, and anchor muscles. Good health habits, such as eating nutritious foods and exercising regularly, are important for maintaining healthy bones. They can also help to prevent a condition that causes bones to lose density and become weak and brittle (osteoporosis). WHY IS BONE MASS IMPORTANT? Bone mass refers to the amount of bone tissue that you have. The higher your bone mass, the stronger your bones. An important step toward having healthy bones throughout life is to have strong and dense bones during childhood. A young adult who has a high bone mass is more likely to have a high bone mass later in life. Bone mass at its greatest it is called peak bone mass. A large decline in bone mass occurs in older adults. In women, it occurs about the time of menopause. During this time, it is important to practice good health habits, because if more bone is lost than what is replaced, the bones will become less healthy and more likely to break (fracture). If you find that you have a low bone mass, you may be able to prevent osteoporosis or further bone loss by changing your diet and lifestyle. HOW CAN I FIND OUT IF MY BONE MASS IS LOW? Bone mass can be measured with an X-ray test that is called a bone mineral density (BMD) test. This test is recommended for all women who are age 65 or older. It may also be recommended for men who are age 70 or older, or for people who are more likely to develop osteoporosis due to:  Having bones that break easily.  Having a long-term disease that weakens bones, such as kidney disease or rheumatoid arthritis.  Having menopause earlier than normal.  Taking medicine that weakens bones, such as steroids, thyroid hormones, or hormone treatment for breast cancer or prostate cancer.  Smoking.  Drinking three or more alcoholic drinks each day. WHAT ARE THE NUTRITIONAL RECOMMENDATIONS FOR HEALTHY BONES? To have healthy bones, you need  to get enough of the right minerals and vitamins. Most nutrition experts recommend getting these nutrients from the foods that you eat. Nutritional recommendations vary from person to person. Ask your health care provider what is healthy for you. Here are some general guidelines. Calcium Recommendations Calcium is the most important (essential) mineral for bone health. Most people can get enough calcium from their diet, but supplements may be recommended for people who are at risk for osteoporosis. Good sources of calcium include:  Dairy products, such as low-fat or nonfat milk, cheese, and yogurt.  Dark green leafy vegetables, such as bok choy and broccoli.  Calcium-fortified foods, such as orange juice, cereal, bread, soy beverages, and tofu products.  Nuts, such as almonds. Follow these recommended amounts for daily calcium intake:  Children, age 1-3: 700 mg.  Children, age 4-8: 1,000 mg.  Children, age 9-13: 1,300 mg.  Teens, age 14-18: 1,300 mg.  Adults, age 19-50: 1,000 mg.  Adults, age 51-70:  Men: 1,000 mg.  Women: 1,200 mg.  Adults, age 71 or older: 1,200 mg.  Pregnant and breastfeeding females:  Teens: 1,300 mg.  Adults: 1,000 mg. Vitamin D Recommendations Vitamin D is the most essential vitamin for bone health. It helps the body to absorb calcium. Sunlight stimulates the skin to make vitamin D, so be sure to get enough sunlight. If you live in a cold climate or you do not get outside often, your health care provider may recommend that you take vitamin D supplements. Good   sources of vitamin D in your diet include:  Egg yolks.  Saltwater fish.  Milk and cereal fortified with vitamin D. Follow these recommended amounts for daily vitamin D intake:  Children and teens, age 1-18: 600 international units.  Adults, age 50 or younger: 400-800 international units.  Adults, age 51 or older: 800-1,000 international units. Other Nutrients Other nutrients for bone  health include:  Phosphorus. This mineral is found in meat, poultry, dairy foods, nuts, and legumes. The recommended daily intake for adult men and adult women is 700 mg.  Magnesium. This mineral is found in seeds, nuts, dark green vegetables, and legumes. The recommended daily intake for adult men is 400-420 mg. For adult women, it is 310-320 mg.  Vitamin K. This vitamin is found in green leafy vegetables. The recommended daily intake is 120 mg for adult men and 90 mg for adult women. WHAT TYPE OF PHYSICAL ACTIVITY IS BEST FOR BUILDING AND MAINTAINING HEALTHY BONES? Weight-bearing and strength-building activities are important for building and maintaining peak bone mass. Weight-bearing activities cause muscles and bones to work against gravity. Strength-building activities increases muscle strength that supports bones. Weight-bearing and muscle-building activities include:  Walking and hiking.  Jogging and running.  Dancing.  Gym exercises.  Lifting weights.  Tennis and racquetball.  Climbing stairs.  Aerobics. Adults should get at least 30 minutes of moderate physical activity on most days. Children should get at least 60 minutes of moderate physical activity on most days. Ask your health care provide what type of exercise is best for you. WHERE CAN I FIND MORE INFORMATION? For more information, check out the following websites:  National Osteoporosis Foundation: http://nof.org/learn/basics  National Institutes of Health: http://www.niams.nih.gov/Health_Info/Bone/Bone_Health/bone_health_for_life.asp   This information is not intended to replace advice given to you by your health care provider. Make sure you discuss any questions you have with your health care provider.   Document Released: 10/22/2003 Document Revised: 12/16/2014 Document Reviewed: 08/06/2014 Elsevier Interactive Patient Education 2016 [employer]vier Inc.  

## 2015-08-04 NOTE — Progress Notes (Signed)
Subjective:    Patient ID: Amy Chambers, female    DOB: 03/14/1928, 81 y.o.   MRN: [id number]  Patient here today for follow up of chronic medical problems.   Hypertension This is a chronic problem. The current episode started more than 1 year ago. The problem has been gradually improving since onset. The problem is controlled. Associated symptoms include shortness of breath. Pertinent negatives include no chest pain or peripheral edema. Risk factors for coronary artery disease include sedentary lifestyle, dyslipidemia and post-menopausal state. Past treatments include ACE inhibitors. The current treatment provides moderate improvement.  Gastroesophageal Reflux She complains of coughing. She reports no abdominal pain, no chest pain or no choking. This is a chronic problem. The current episode started more than 1 year ago. The problem occurs occasionally. The problem has been waxing and waning. The symptoms are aggravated by certain foods. Risk factors include smoking/tobacco exposure. She has tried a PPI for the symptoms. The treatment provided moderate relief.  Hyperlipidemia This is a chronic problem. The current episode started more than 1 year ago. The problem is controlled. Exacerbating diseases include hypothyroidism. Associated symptoms include shortness of breath. Pertinent negatives include no chest pain or leg pain. Current antihyperlipidemic treatment includes diet change (garlic PO.). The current treatment provides moderate improvement of lipids. Risk factors for coronary artery disease include hypertension, post-menopausal and dyslipidemia.  Osteopenia.  calcium and vitamin d OTC daily- no side effects  Review of Systems  Constitutional: Negative.   HENT: Negative.   Eyes: Negative.   Respiratory: Positive for cough and shortness of breath. Negative for choking.   Cardiovascular: Negative for chest pain.  Gastrointestinal: Negative for abdominal pain.  Endocrine: Negative.     Genitourinary: Negative.   Allergic/Immunologic: Negative.   Neurological: Negative.   Hematological: Negative.   Psychiatric/Behavioral: Negative.        Objective:   Physical Exam  Constitutional: She is oriented to person, place, and time. She appears well-developed.  HENT:  Head: Normocephalic.  Eyes: Conjunctivae are normal. Pupils are equal, round, and reactive to light.  Neck: Normal range of motion.  Cardiovascular: Normal rate and regular rhythm.   Pulmonary/Chest: Effort normal and breath sounds normal.  Abdominal: Soft.  Musculoskeletal: Normal range of motion.  Neurological: She is alert and oriented to person, place, and time. She has normal reflexes.  Skin: Skin is warm.  Psychiatric: She has a normal mood and affect.    BP 142/73 mmHg  Pulse 64  Temp(Src) 96.8 F (36 C) (Oral)  Ht 4' 10"  (1.473 m)  Wt 116 lb (52.617 kg)  BMI 24.25 kg/m2   Assessment & Plan:   1. Essential hypertension Do not add salt to diet - benazepril (LOTENSIN) 20 MG tablet; Take 1 tablet (20 mg total) by mouth daily.  Dispense: 30 tablet; Refill: 5 - CMP14+EGFR  2. Gastroesophageal reflux disease without esophagitis Avoid spicy foods Do not eat 2 hours prior to bedtime - omeprazole (PRILOSEC) 20 MG capsule; Take 1 capsule (20 mg total) by mouth daily.  Dispense: 30 capsule; Refill: 5  3. Hypothyroidism, unspecified hypothyroidism type - levothyroxine (SYNTHROID, LEVOTHROID) 75 MCG tablet; Take 1 tablet (75 mcg total) by mouth daily.  Dispense: 30 tablet; Refill: 5 - Thyroid Panel With TSH  4. Osteopenia Weight bearing exercises  5. 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  Dispense: 11 g; Refill: 3   Discussed wood stove heat and how the heat and dryness can affect breathing- need to not  get house so hot and need to humidify the air to make it easier on breathing. Labs pending Health maintenance reviewed Diet and exercise encouraged Continue all meds Follow up  In 6 month   [place], [name]

## 2015-08-05 LAB — LIPID PANEL
CHOL/HDL RATIO: 4.3 ratio (ref 0.0–4.4)
Cholesterol, Total: 173 mg/dL (ref 100–199)
HDL: 40 mg/dL (ref >39)
LDL Calculated: 107 mg/dL — ABNORMAL HIGH (ref 0–99)
TRIGLYCERIDES: 131 mg/dL (ref 0–149)
VLDL Cholesterol Cal: 26 mg/dL (ref 5–40)

## 2015-08-05 LAB — CMP14+EGFR
ALT: 14 IU/L (ref 0–32)
AST: 20 IU/L (ref 0–40)
Albumin/Globulin Ratio: 1.8 (ref 1.1–2.5)
Albumin: 4.2 g/dL (ref 3.5–4.7)
Alkaline Phosphatase: 75 IU/L (ref 39–117)
BUN/Creatinine Ratio: 24 (ref 11–26)
BUN: 21 mg/dL (ref 8–27)
Bilirubin Total: 0.3 mg/dL (ref 0.0–1.2)
CALCIUM: 9.9 mg/dL (ref 8.7–10.3)
CO2: 24 mmol/L (ref 18–29)
Chloride: 99 mmol/L (ref 96–106)
Creatinine, Ser: 0.87 mg/dL (ref 0.57–1.00)
GFR, EST AFRICAN AMERICAN: 70 mL/min/{1.73_m2} (ref >59)
GFR, EST NON AFRICAN AMERICAN: 61 mL/min/{1.73_m2} (ref >59)
GLUCOSE: 85 mg/dL (ref 65–99)
Globulin, Total: 2.3 g/dL (ref 1.5–4.5)
Potassium: 5.4 mmol/L — ABNORMAL HIGH (ref 3.5–5.2)
SODIUM: 140 mmol/L (ref 134–144)
Total Protein: 6.5 g/dL (ref 6.0–8.5)

## 2015-08-06 ENCOUNTER — Other Ambulatory Visit: Payer: Medicare Other

## 2015-08-06 DIAGNOSIS — E875 Hyperkalemia: Secondary | ICD-10-CM

## 2015-08-06 NOTE — Progress Notes (Signed)
Lab only 

## 2015-08-07 LAB — POTASSIUM: Potassium: 4.7 mmol/L (ref 3.5–5.2)

## 2016-01-08 ENCOUNTER — Other Ambulatory Visit: Payer: Self-pay | Admitting: Nurse Practitioner

## 2016-01-14 ENCOUNTER — Other Ambulatory Visit: Payer: Self-pay | Admitting: Nurse Practitioner

## 2016-02-09 ENCOUNTER — Ambulatory Visit: Payer: Medicare Other | Admitting: Nurse Practitioner

## 2016-03-08 ENCOUNTER — Other Ambulatory Visit: Payer: Self-pay | Admitting: Nurse Practitioner

## 2016-03-08 DIAGNOSIS — E039 Hypothyroidism, unspecified: Secondary | ICD-10-CM

## 2016-03-08 DIAGNOSIS — K219 Gastro-esophageal reflux disease without esophagitis: Secondary | ICD-10-CM

## 2016-03-08 DIAGNOSIS — I10 Essential (primary) hypertension: Secondary | ICD-10-CM

## 2016-03-08 NOTE — Telephone Encounter (Signed)
Last refill without being seen 

## 2016-05-02 ENCOUNTER — Ambulatory Visit (INDEPENDENT_AMBULATORY_CARE_PROVIDER_SITE_OTHER): Payer: Medicare Other | Admitting: Nurse Practitioner

## 2016-05-02 ENCOUNTER — Encounter: Payer: Self-pay | Admitting: Nurse Practitioner

## 2016-05-02 VITALS — BP 138/75 | HR 68 | Temp 98.5°F | Ht <= 58 in | Wt 118.0 lb

## 2016-05-02 DIAGNOSIS — E785 Hyperlipidemia, unspecified: Secondary | ICD-10-CM | POA: Diagnosis not present

## 2016-05-02 DIAGNOSIS — J42 Unspecified chronic bronchitis: Secondary | ICD-10-CM | POA: Diagnosis not present

## 2016-05-02 DIAGNOSIS — I10 Essential (primary) hypertension: Secondary | ICD-10-CM | POA: Diagnosis not present

## 2016-05-02 DIAGNOSIS — K219 Gastro-esophageal reflux disease without esophagitis: Secondary | ICD-10-CM

## 2016-05-02 DIAGNOSIS — R110 Nausea: Secondary | ICD-10-CM | POA: Diagnosis not present

## 2016-05-02 DIAGNOSIS — E039 Hypothyroidism, unspecified: Secondary | ICD-10-CM | POA: Diagnosis not present

## 2016-05-02 MED ORDER — BUDESONIDE-FORMOTEROL FUMARATE 80-4.5 MCG/ACT IN AERO: 2.0000 | INHALATION_SPRAY | Freq: Two times a day (BID) | RESPIRATORY_TRACT | 1 refills | Status: DC

## 2016-05-02 MED ORDER — OMEPRAZOLE 20 MG PO CPDR: 20.0000 mg | DELAYED_RELEASE_CAPSULE | Freq: Every day | ORAL | 1 refills | Status: DC

## 2016-05-02 MED ORDER — LEVOTHYROXINE SODIUM 75 MCG PO TABS: 75.0000 ug | ORAL_TABLET | Freq: Every day | ORAL | 1 refills | Status: DC

## 2016-05-02 MED ORDER — ONDANSETRON HCL 4 MG PO TABS
4.0000 mg | ORAL_TABLET | Freq: Three times a day (TID) | ORAL | 0 refills | Status: DC | PRN
Start: 2016-05-02 — End: 2018-03-21

## 2016-05-02 NOTE — Progress Notes (Signed)
Subjective:    Patient ID: Amy Chambers, female    DOB: 28-May-1928, 82 y.o.   MRN: [id number]  Patient here today for follow up of chronic medical problems. NO changes since last visit. No complaints today.  Outpatient Encounter Prescriptions as of 05/02/2016  Medication Sig  . aspirin 81 MG tablet Take 81 mg by mouth daily.  . benazepril (LOTENSIN) 20 MG tablet TAKE ONE TABLET BY MOUTH ONCE DAILY  . calcium-vitamin D 250-100 MG-UNIT per tablet Take 1 tablet by mouth 2 (two) times daily.   . Cholecalciferol (VITAMIN D) 2000 UNITS CAPS Take by mouth. 1 daily    . GARLIC PO Take 1 tablet by mouth daily.  Marland Kitchen levothyroxine (SYNTHROID, LEVOTHROID) 75 MCG tablet TAKE ONE TABLET BY MOUTH ONCE DAILY  . omeprazole (PRILOSEC) 20 MG capsule TAKE ONE CAPSULE BY MOUTH ONCE DAILY  . ondansetron (ZOFRAN) 4 MG tablet Take 1 tablet (4 mg total) by mouth every 8 (eight) hours as needed for nausea or vomiting.  Marland Kitchen PROAIR HFA 108 (90 Base) MCG/ACT inhaler INHALE TWO PUFFS BY MOUTH EVERY 6 HOURS AS NEEDED FOR WHEEZING  . SYMBICORT 80-4.5 MCG/ACT inhaler INHALE TWO PUFFS BY MOUTH TWICE DAILY   No facility-administered encounter medications on file as of 05/02/2016.     Hypertension  This is a chronic problem. The current episode started more than 1 year ago. The problem has been gradually improving since onset. The problem is controlled. Associated symptoms include shortness of breath. Pertinent negatives include no chest pain or peripheral edema. Risk factors for coronary artery disease include sedentary lifestyle, dyslipidemia and post-menopausal state. Past treatments include ACE inhibitors. The current treatment provides moderate improvement. Hypertensive end-organ damage includes a thyroid problem. There is no history of CVA.  Gastroesophageal Reflux  She complains of coughing. She reports no abdominal pain, no chest pain or no choking. This is a chronic problem. The current episode started more than 1 year  ago. The problem occurs occasionally. The problem has been waxing and waning. The symptoms are aggravated by certain foods. Risk factors include smoking/tobacco exposure. She has tried a PPI for the symptoms. The treatment provided moderate relief.  Hyperlipidemia  This is a chronic problem. The current episode started more than 1 year ago. The problem is controlled. Exacerbating diseases include hypothyroidism. Associated symptoms include shortness of breath. Pertinent negatives include no chest pain or leg pain. Current antihyperlipidemic treatment includes diet change (garlic PO.). The current treatment provides moderate improvement of lipids. Risk factors for coronary artery disease include hypertension, post-menopausal and dyslipidemia.  Thyroid Problem  Her past medical history is significant for hyperlipidemia.  Osteopenia.  calcium and vitamin d OTC daily- no side effects COPD Patient was a former smoker- she quit about 10 years ago , but her husbands still smokes so she is around it all the time. She is on symbicort BID and is doing well. SHe uses albuterol 2-3 x a week.     Review of Systems  Constitutional: Negative.   HENT: Negative.   Eyes: Negative.   Respiratory: Positive for cough and shortness of breath. Negative for choking.   Cardiovascular: Negative for chest pain.  Gastrointestinal: Negative for abdominal pain.  Endocrine: Negative.   Genitourinary: Negative.   Allergic/Immunologic: Negative.   Neurological: Negative.   Hematological: Negative.   Psychiatric/Behavioral: Negative.        Objective:   Physical Exam  Constitutional: She is oriented to person, place, and time. She appears well-developed.  HENT:  Head: Normocephalic.  Eyes: Conjunctivae are normal. Pupils are equal, round, and reactive to light.  Neck: Normal range of motion.  Cardiovascular: Normal rate and regular rhythm.   Pulmonary/Chest: Effort normal and breath sounds normal.  Abdominal:  Soft.  Musculoskeletal: Normal range of motion.  Neurological: She is alert and oriented to person, place, and time. She has normal reflexes.  Skin: Skin is warm.  Psychiatric: She has a normal mood and affect.    BP 138/75   Pulse 68   Temp 98.5 F (36.9 C) (Oral)   Ht 4' 10"  (1.473 m)   Wt 118 lb (53.5 kg)   BMI 24.66 kg/m    Assessment & Plan:  1. Essential hypertension Do not add salt to diet - CMP14+EGFR  2. Gastroesophageal reflux disease without esophagitis Avoid spicy foods Do not eat 2 hours prior to bedtime - omeprazole (PRILOSEC) 20 MG capsule; Take 1 capsule (20 mg total) by mouth daily.  Dispense: 90 capsule; Refill: 1  3. Hypothyroidism, unspecified hypothyroidism type - levothyroxine (SYNTHROID, LEVOTHROID) 75 MCG tablet; Take 1 tablet (75 mcg total) by mouth daily.  Dispense: 90 tablet; Refill: 1 - Thyroid Panel With TSH  4. Hyperlipemia Low fat diet - Lipid panel  5. Chronic bronchitis, unspecified chronic bronchitis type ([place]) Need to use symbicort BID as rx A\only use albuterol as rescusinhaler    Labs pending Health maintenance reviewed Diet and exercise encouraged Continue all meds Follow up  In 3 months   [place], [name]

## 2016-05-02 NOTE — Patient Instructions (Signed)
Chronic Obstructive Pulmonary Disease Chronic obstructive pulmonary disease (COPD) is a common lung condition in which airflow from the lungs is limited. COPD is a general term that can be used to describe many different lung problems that limit airflow, including both chronic bronchitis and emphysema. If you have COPD, your lung function will probably never return to normal, but there are measures you can take to improve lung function and make yourself feel better. CAUSES   Smoking (common).  Exposure to secondhand smoke.  Genetic problems.  Chronic inflammatory lung diseases or recurrent infections. SYMPTOMS  Shortness of breath, especially with physical activity.  Deep, persistent (chronic) cough with a large amount of thick mucus.  Wheezing.  Rapid breaths (tachypnea).  Gray or bluish discoloration (cyanosis) of the skin, especially in your fingers, toes, or lips.  Fatigue.  Weight loss.  Frequent infections or episodes when breathing symptoms become much worse (exacerbations).  Chest tightness. DIAGNOSIS Your health care provider will take a medical history and perform a physical examination to diagnose COPD. Additional tests for COPD may include:  Lung (pulmonary) function tests.  Chest X-ray.  CT scan.  Blood tests. TREATMENT  Treatment for COPD may include:  Inhaler and nebulizer medicines. These help manage the symptoms of COPD and make your breathing more comfortable.  Supplemental oxygen. Supplemental oxygen is only helpful if you have a low oxygen level in your blood.  Exercise and physical activity. These are beneficial for nearly all people with COPD.  Lung surgery or transplant.  Nutrition therapy to gain weight, if you are underweight.  Pulmonary rehabilitation. This may involve working with a team of health care providers and specialists, such as respiratory, occupational, and physical therapists. HOME CARE INSTRUCTIONS  Take all medicines  (inhaled or pills) as directed by your health care provider.  Avoid over-the-counter medicines or cough syrups that dry up your airway (such as antihistamines) and slow down the elimination of secretions unless instructed otherwise by your health care provider.  If you are a smoker, the most important thing that you can do is stop smoking. Continuing to smoke will cause further lung damage and breathing trouble. Ask your health care provider for help with quitting smoking. He or she can direct you to community resources or hospitals that provide support.  Avoid exposure to irritants such as smoke, chemicals, and fumes that aggravate your breathing.  Use oxygen therapy and pulmonary rehabilitation if directed by your health care provider. If you require home oxygen therapy, ask your health care provider whether you should purchase a pulse oximeter to measure your oxygen level at home.  Avoid contact with individuals who have a contagious illness.  Avoid extreme temperature and humidity changes.  Eat healthy foods. Eating smaller, more frequent meals and resting before meals may help you maintain your strength.  Stay active, but balance activity with periods of rest. Exercise and physical activity will help you maintain your ability to do things you want to do.  Preventing infection and hospitalization is very important when you have COPD. Make sure to receive all the vaccines your health care provider recommends, especially the pneumococcal and influenza vaccines. Ask your health care provider whether you need a pneumonia vaccine.  Learn and use relaxation techniques to manage stress.  Learn and use controlled breathing techniques as directed by your health care provider. Controlled breathing techniques include:  Pursed lip breathing. Start by breathing in (inhaling) through your nose for 1 second. Then, purse your lips as if you were   going to whistle and breathe out (exhale) through the  pursed lips for 2 seconds.  Diaphragmatic breathing. Start by putting one hand on your abdomen just above your waist. Inhale slowly through your nose. The hand on your abdomen should move out. Then purse your lips and exhale slowly. You should be able to feel the hand on your abdomen moving in as you exhale.  Learn and use controlled coughing to clear mucus from your lungs. Controlled coughing is a series of short, progressive coughs. The steps of controlled coughing are: 1. Lean your head slightly forward. 2. Breathe in deeply using diaphragmatic breathing. 3. Try to hold your breath for 3 seconds. 4. Keep your mouth slightly open while coughing twice. 5. Spit any mucus out into a tissue. 6. Rest and repeat the steps once or twice as needed. SEEK MEDICAL CARE IF:  You are coughing up more mucus than usual.  There is a change in the color or thickness of your mucus.  Your breathing is more labored than usual.  Your breathing is faster than usual. SEEK IMMEDIATE MEDICAL CARE IF:  You have shortness of breath while you are resting.  You have shortness of breath that prevents you from:  Being able to talk.  Performing your usual physical activities.  You have chest pain lasting longer than 5 minutes.  Your skin color is more cyanotic than usual.  You measure low oxygen saturations for longer than 5 minutes with a pulse oximeter. MAKE SURE YOU:  Understand these instructions.  Will watch your condition.  Will get help right away if you are not doing well or get worse.   This information is not intended to replace advice given to you by your health care provider. Make sure you discuss any questions you have with your health care provider.   Document Released: 05/11/2005 Document Revised: 08/22/2014 Document Reviewed: 03/28/2013 Elsevier Interactive Patient Education 2016 [employer] Inc[employer]  

## 2016-05-02 NOTE — Addendum Note (Signed)
Addended by: [name], [name] on: 05/02/2016 08:35 AM   Modules accepted: Orders

## 2016-05-03 LAB — LIPID PANEL
CHOLESTEROL TOTAL: 152 mg/dL (ref 100–199)
Chol/HDL Ratio: 3.7 ratio units (ref 0.0–4.4)
HDL: 41 mg/dL (ref >39)
LDL CALC: 87 mg/dL (ref 0–99)
TRIGLYCERIDES: 121 mg/dL (ref 0–149)
VLDL CHOLESTEROL CAL: 24 mg/dL (ref 5–40)

## 2016-05-03 LAB — CMP14+EGFR
ALK PHOS: 78 IU/L (ref 39–117)
ALT: 10 IU/L (ref 0–32)
AST: 16 IU/L (ref 0–40)
Albumin/Globulin Ratio: 1.7 (ref 1.2–2.2)
Albumin: 3.9 g/dL (ref 3.5–4.7)
BILIRUBIN TOTAL: 0.3 mg/dL (ref 0.0–1.2)
BUN / CREAT RATIO: 22 (ref 12–28)
BUN: 15 mg/dL (ref 8–27)
CHLORIDE: 101 mmol/L (ref 96–106)
CO2: 26 mmol/L (ref 18–29)
CREATININE: 0.69 mg/dL (ref 0.57–1.00)
Calcium: 9.2 mg/dL (ref 8.7–10.3)
GFR calc Af Amer: 91 mL/min/{1.73_m2} (ref >59)
GFR calc non Af Amer: 79 mL/min/{1.73_m2} (ref >59)
GLUCOSE: 92 mg/dL (ref 65–99)
Globulin, Total: 2.3 g/dL (ref 1.5–4.5)
Potassium: 4 mmol/L (ref 3.5–5.2)
SODIUM: 142 mmol/L (ref 134–144)
Total Protein: 6.2 g/dL (ref 6.0–8.5)

## 2016-05-03 LAB — THYROID PANEL WITH TSH
Free Thyroxine Index: 2.5 (ref 1.2–4.9)
T3 Uptake Ratio: 29 % (ref 24–39)
T4 TOTAL: 8.6 ug/dL (ref 4.5–12.0)
TSH: 2.08 u[IU]/mL (ref 0.450–4.500)

## 2016-05-19 ENCOUNTER — Ambulatory Visit (INDEPENDENT_AMBULATORY_CARE_PROVIDER_SITE_OTHER): Payer: Medicare Other

## 2016-05-19 DIAGNOSIS — Z23 Encounter for immunization: Secondary | ICD-10-CM

## 2016-06-07 ENCOUNTER — Other Ambulatory Visit: Payer: Self-pay | Admitting: Nurse Practitioner

## 2016-06-07 DIAGNOSIS — I10 Essential (primary) hypertension: Secondary | ICD-10-CM

## 2016-06-09 ENCOUNTER — Other Ambulatory Visit: Payer: Self-pay | Admitting: Nurse Practitioner

## 2016-06-09 DIAGNOSIS — I10 Essential (primary) hypertension: Secondary | ICD-10-CM

## 2016-08-16 ENCOUNTER — Other Ambulatory Visit: Payer: Self-pay | Admitting: Nurse Practitioner

## 2016-09-05 ENCOUNTER — Ambulatory Visit: Payer: Medicare Other | Admitting: Nurse Practitioner

## 2016-09-20 ENCOUNTER — Ambulatory Visit (INDEPENDENT_AMBULATORY_CARE_PROVIDER_SITE_OTHER): Payer: Medicare Other | Admitting: Nurse Practitioner

## 2016-09-20 ENCOUNTER — Encounter: Payer: Self-pay | Admitting: Nurse Practitioner

## 2016-09-20 VITALS — BP 136/73 | HR 71 | Temp 97.9°F | Ht <= 58 in | Wt 114.2 lb

## 2016-09-20 DIAGNOSIS — E039 Hypothyroidism, unspecified: Secondary | ICD-10-CM | POA: Diagnosis not present

## 2016-09-20 DIAGNOSIS — E782 Mixed hyperlipidemia: Secondary | ICD-10-CM

## 2016-09-20 DIAGNOSIS — M8580 Other specified disorders of bone density and structure, unspecified site: Secondary | ICD-10-CM

## 2016-09-20 DIAGNOSIS — K219 Gastro-esophageal reflux disease without esophagitis: Secondary | ICD-10-CM

## 2016-09-20 DIAGNOSIS — I10 Essential (primary) hypertension: Secondary | ICD-10-CM | POA: Diagnosis not present

## 2016-09-20 MED ORDER — BUDESONIDE-FORMOTEROL FUMARATE 80-4.5 MCG/ACT IN AERO: 2.0000 | INHALATION_SPRAY | Freq: Two times a day (BID) | RESPIRATORY_TRACT | 1 refills | Status: DC

## 2016-09-20 NOTE — Progress Notes (Signed)
Subjective:    Patient ID: Amy Chambers, female    DOB: 09-15-1927, 83 y.o.   MRN: [id number]  Patient here today for follow up of chronic medical problems. NO changes since last visit. No complaints today.  Outpatient Encounter Prescriptions as of 09/20/2016  Medication Sig  . aspirin 81 MG tablet Take 81 mg by mouth daily.  . benazepril (LOTENSIN) 20 MG tablet TAKE ONE TABLET BY MOUTH ONCE DAILY  . calcium-vitamin D 250-100 MG-UNIT per tablet Take 1 tablet by mouth 2 (two) times daily.   . Cholecalciferol (VITAMIN D) 2000 UNITS CAPS Take by mouth. 1 daily    . GARLIC PO Take 1 tablet by mouth daily.  Marland Kitchen levothyroxine (SYNTHROID, LEVOTHROID) 75 MCG tablet Take 1 tablet (75 mcg total) by mouth daily.  Marland Kitchen omeprazole (PRILOSEC) 20 MG capsule Take 1 capsule (20 mg total) by mouth daily.  Marland Kitchen PROAIR HFA 108 (90 Base) MCG/ACT inhaler INHALE TWO PUFFS BY MOUTH EVERY 6 HOURS AS NEEDED FOR WHEEZING  . SYMBICORT 80-4.5 MCG/ACT inhaler INHALE TWO PUFFS BY MOUTH TWICE DAILY  . ondansetron (ZOFRAN) 4 MG tablet Take 1 tablet (4 mg total) by mouth every 8 (eight) hours as needed for nausea or vomiting. (Patient not taking: Reported on 09/20/2016)    Hypertension  This is a chronic problem. The current episode started more than 1 year ago. The problem has been gradually improving since onset. The problem is controlled. Associated symptoms include shortness of breath. Pertinent negatives include no chest pain or peripheral edema. Risk factors for coronary artery disease include sedentary lifestyle, dyslipidemia and post-menopausal state. Past treatments include ACE inhibitors. The current treatment provides moderate improvement. There is no history of CVA. Identifiable causes of hypertension include a thyroid problem.  Gastroesophageal Reflux  She complains of coughing. She reports no abdominal pain, no chest pain or no choking. This is a chronic problem. The current episode started more than 1 year ago. The  problem occurs occasionally. The problem has been waxing and waning. The symptoms are aggravated by certain foods. Risk factors include smoking/tobacco exposure. She has tried a PPI for the symptoms. The treatment provided moderate relief.  Hyperlipidemia  This is a chronic problem. The current episode started more than 1 year ago. The problem is controlled. Exacerbating diseases include hypothyroidism. Associated symptoms include shortness of breath. Pertinent negatives include no chest pain or leg pain. Current antihyperlipidemic treatment includes diet change (garlic PO.). The current treatment provides moderate improvement of lipids. Risk factors for coronary artery disease include hypertension, post-menopausal and dyslipidemia.  Thyroid Problem  Her past medical history is significant for hyperlipidemia.  Osteopenia.  calcium and vitamin d OTC daily- no side effects COPD Patient was a former smoker- she quit about 10 years ago , but her husbands still smokes so she is around it all the time. She is on symbicort BID and is doing well. SHe uses albuterol 2-3 x a week.     Review of Systems  Constitutional: Negative.   HENT: Negative.   Eyes: Negative.   Respiratory: Positive for cough and shortness of breath. Negative for choking.   Cardiovascular: Negative for chest pain.  Gastrointestinal: Negative for abdominal pain.  Endocrine: Negative.   Genitourinary: Negative.   Allergic/Immunologic: Negative.   Neurological: Negative.   Hematological: Negative.   Psychiatric/Behavioral: Negative.        Objective:   Physical Exam  Constitutional: She is oriented to person, place, and time. She appears well-developed.  HENT:  Head: Normocephalic.  Eyes: Conjunctivae are normal. Pupils are equal, round, and reactive to light.  Neck: Normal range of motion.  Cardiovascular: Normal rate and regular rhythm.   Pulmonary/Chest: Effort normal and breath sounds normal.  Abdominal: Soft.    Musculoskeletal: Normal range of motion.  Neurological: She is alert and oriented to person, place, and time. She has normal reflexes.  Skin: Skin is warm.  Psychiatric: She has a normal mood and affect.    BP 136/73   Pulse 71   Temp 97.9 F (36.6 C) (Oral)   Ht _0  (1.473 m)   Wt 114 lb 3.2 oz (51.8 kg)   BMI 23.87 kg/m    Assessment & Plan:  1. Essential hypertension *low sodium diet  2. Gastroesophageal reflux disease without esophagitis Avoid spicy foods Do not eat 2 hours prior to bedtime  3. Hypothyroidism, unspecified type  4. Osteopenia, unspecified location Weight bearing exrcises  5. Mixed hyperlipidemia Low fat diet  Orders Placed This Encounter  Procedures  . CMP14+EGFR  . Lipid panel  . Thyroid Panel With TSH     Labs pending Health maintenance reviewed Diet and exercise encouraged Continue all meds Follow up  In 6 months   [place], [name]

## 2016-09-20 NOTE — Patient Instructions (Signed)
Fall Prevention in the Home Introduction Falls can cause injuries. They can happen to people of all ages. There are many things you can do to make your home safe and to help prevent falls. What can I do on the outside of my home?  Regularly fix the edges of walkways and driveways and fix any cracks.  Remove anything that might make you trip as you walk through a door, such as a raised step or threshold.  Trim any bushes or trees on the path to your home.  Use bright outdoor lighting.  Clear any walking paths of anything that might make someone trip, such as rocks or tools.  Regularly check to see if handrails are loose or broken. Make sure that both sides of any steps have handrails.  Any raised decks and porches should have guardrails on the edges.  Have any leaves, snow, or ice cleared regularly.  Use sand or salt on walking paths during winter.  Clean up any spills in your garage right away. This includes oil or grease spills. What can I do in the bathroom?  Use night lights.  Install grab bars by the toilet and in the tub and shower. Do not use towel bars as grab bars.  Use non-skid mats or decals in the tub or shower.  If you need to sit down in the shower, use a plastic, non-slip stool.  Keep the floor dry. Clean up any water that spills on the floor as soon as it happens.  Remove soap buildup in the tub or shower regularly.  Attach bath mats securely with double-sided non-slip rug tape.  Do not have throw rugs and other things on the floor that can make you trip. What can I do in the bedroom?  Use night lights.  Make sure that you have a light by your bed that is easy to reach.  Do not use any sheets or blankets that are too big for your bed. They should not hang down onto the floor.  Have a firm chair that has side arms. You can use this for support while you get dressed.  Do not have throw rugs and other things on the floor that can make you trip. What can  I do in the kitchen?  Clean up any spills right away.  Avoid walking on wet floors.  Keep items that you use a lot in easy-to-reach places.  If you need to reach something above you, use a strong step stool that has a grab bar.  Keep electrical cords out of the way.  Do not use floor polish or wax that makes floors slippery. If you must use wax, use non-skid floor wax.  Do not have throw rugs and other things on the floor that can make you trip. What can I do with my stairs?  Do not leave any items on the stairs.  Make sure that there are handrails on both sides of the stairs and use them. Fix handrails that are broken or loose. Make sure that handrails are as long as the stairways.  Check any carpeting to make sure that it is firmly attached to the stairs. Fix any carpet that is loose or worn.  Avoid having throw rugs at the top or bottom of the stairs. If you do have throw rugs, attach them to the floor with carpet tape.  Make sure that you have a light switch at the top of the stairs and the bottom of the stairs. If you   do not have them, ask someone to add them for you. What else can I do to help prevent falls?  Wear shoes that:  Do not have high heels.  Have rubber bottoms.  Are comfortable and fit you well.  Are closed at the toe. Do not wear sandals.  If you use a stepladder:  Make sure that it is fully opened. Do not climb a closed stepladder.  Make sure that both sides of the stepladder are locked into place.  Ask someone to hold it for you, if possible.  Clearly mark and make sure that you can see:  Any grab bars or handrails.  First and last steps.  Where the edge of each step is.  Use tools that help you move around (mobility aids) if they are needed. These include:  Canes.  Walkers.  Scooters.  Crutches.  Turn on the lights when you go into a dark area. Replace any light bulbs as soon as they burn out.  Set up your furniture so you have a  clear path. Avoid moving your furniture around.  If any of your floors are uneven, fix them.  If there are any pets around you, be aware of where they are.  Review your medicines with your doctor. Some medicines can make you feel dizzy. This can increase your chance of falling. Ask your doctor what other things that you can do to help prevent falls. This information is not intended to replace advice given to you by your health care provider. Make sure you discuss any questions you have with your health care provider. Document Released: 05/28/2009 Document Revised: 01/07/2016 Document Reviewed: 09/05/2014  2017 Elsevier  

## 2016-09-21 LAB — CMP14+EGFR
ALBUMIN: 4 g/dL (ref 3.5–4.7)
ALK PHOS: 77 IU/L (ref 39–117)
ALT: 16 IU/L (ref 0–32)
AST: 20 IU/L (ref 0–40)
Albumin/Globulin Ratio: 1.7 (ref 1.2–2.2)
BILIRUBIN TOTAL: 0.3 mg/dL (ref 0.0–1.2)
BUN / CREAT RATIO: 23 (ref 12–28)
BUN: 17 mg/dL (ref 8–27)
CHLORIDE: 103 mmol/L (ref 96–106)
CO2: 23 mmol/L (ref 18–29)
Calcium: 9.1 mg/dL (ref 8.7–10.3)
Creatinine, Ser: 0.74 mg/dL (ref 0.57–1.00)
GFR calc Af Amer: 85 mL/min/{1.73_m2} (ref >59)
GFR calc non Af Amer: 74 mL/min/{1.73_m2} (ref >59)
GLOBULIN, TOTAL: 2.3 g/dL (ref 1.5–4.5)
GLUCOSE: 91 mg/dL (ref 65–99)
Potassium: 4 mmol/L (ref 3.5–5.2)
SODIUM: 141 mmol/L (ref 134–144)
Total Protein: 6.3 g/dL (ref 6.0–8.5)

## 2016-09-21 LAB — LIPID PANEL
CHOLESTEROL TOTAL: 157 mg/dL (ref 100–199)
Chol/HDL Ratio: 3.8 ratio units (ref 0.0–4.4)
HDL: 41 mg/dL (ref >39)
LDL Calculated: 93 mg/dL (ref 0–99)
TRIGLYCERIDES: 114 mg/dL (ref 0–149)
VLDL Cholesterol Cal: 23 mg/dL (ref 5–40)

## 2016-09-21 LAB — THYROID PANEL WITH TSH
Free Thyroxine Index: 2.4 (ref 1.2–4.9)
T3 UPTAKE RATIO: 29 % (ref 24–39)
T4 TOTAL: 8.2 ug/dL (ref 4.5–12.0)
TSH: 0.986 u[IU]/mL (ref 0.450–4.500)

## 2016-10-31 DIAGNOSIS — H5201 Hypermetropia, right eye: Secondary | ICD-10-CM | POA: Diagnosis not present

## 2016-10-31 DIAGNOSIS — H5212 Myopia, left eye: Secondary | ICD-10-CM | POA: Diagnosis not present

## 2016-10-31 DIAGNOSIS — H524 Presbyopia: Secondary | ICD-10-CM | POA: Diagnosis not present

## 2016-10-31 DIAGNOSIS — H52223 Regular astigmatism, bilateral: Secondary | ICD-10-CM | POA: Diagnosis not present

## 2016-12-05 ENCOUNTER — Other Ambulatory Visit: Payer: Self-pay | Admitting: Nurse Practitioner

## 2016-12-05 DIAGNOSIS — K219 Gastro-esophageal reflux disease without esophagitis: Secondary | ICD-10-CM

## 2016-12-05 DIAGNOSIS — E039 Hypothyroidism, unspecified: Secondary | ICD-10-CM

## 2016-12-27 ENCOUNTER — Ambulatory Visit (INDEPENDENT_AMBULATORY_CARE_PROVIDER_SITE_OTHER): Payer: Medicare Other | Admitting: Nurse Practitioner

## 2016-12-27 ENCOUNTER — Encounter: Payer: Self-pay | Admitting: Nurse Practitioner

## 2016-12-27 VITALS — BP 145/76 | HR 69 | Temp 97.5°F | Ht <= 58 in | Wt 112.0 lb

## 2016-12-27 DIAGNOSIS — R42 Dizziness and giddiness: Secondary | ICD-10-CM | POA: Diagnosis not present

## 2016-12-27 MED ORDER — METHYLPREDNISOLONE ACETATE 80 MG/ML IJ SUSP
80.0000 mg | Freq: Once | INTRAMUSCULAR | Status: DC
Start: 2016-12-27 — End: 2016-12-27

## 2016-12-27 MED ORDER — METHYLPREDNISOLONE ACETATE 80 MG/ML IJ SUSP
40.0000 mg | Freq: Once | INTRAMUSCULAR | Status: AC
  Administered 2016-12-27: 40 mg via INTRAMUSCULAR

## 2016-12-27 MED ORDER — MECLIZINE HCL 25 MG PO TABS: 25.0000 mg | ORAL_TABLET | Freq: Three times a day (TID) | ORAL | 0 refills | Status: DC | PRN

## 2016-12-27 NOTE — Progress Notes (Signed)
   Subjective:    Patient ID: Amy Chambers, female    DOB: Jul 13, 1928, 83 y.o.   MRN: [id number]  HPI Patient brought in by daughter today with c/o dizziness. This has been going on for over a week. Can occur at anytime- such as just turning er head or rolling over in bed. Occasional nausea, no vomiting.    Review of Systems  Constitutional: Negative for chills, fatigue and fever.  HENT: Positive for congestion. Negative for ear pain, postnasal drip, rhinorrhea, sinus pain, sinus pressure, sore throat and trouble swallowing.   Respiratory: Negative for cough.   Cardiovascular: Negative.   Gastrointestinal: Negative.   Genitourinary: Negative.   Neurological: Positive for dizziness. Negative for headaches.  Psychiatric/Behavioral: Negative.   All other systems reviewed and are negative.      Objective:   Physical Exam  Constitutional: She is oriented to person, place, and time. She appears well-developed and well-nourished. No distress.  HENT:  Right Ear: External ear normal.  Left Ear: External ear normal.  Nose: Nose normal.  Mouth/Throat: Oropharynx is clear and moist.  Eyes: Conjunctivae are normal. Pupils are equal, round, and reactive to light.  Neck: Normal range of motion. Neck supple.  Cardiovascular: Normal rate.   Pulmonary/Chest: Effort normal and breath sounds normal.  Neurological: She is alert and oriented to person, place, and time. She has normal reflexes. No cranial nerve deficit.  Skin: Skin is warm.  Psychiatric: She has a normal mood and affect. Her behavior is normal. Judgment and thought content normal.   BP (!) 145/76   Pulse 69   Temp 97.5 F (36.4 C) (Oral)   Ht 4\' 10"  (1.473 m)   Wt 112 lb (50.8 kg)   BMI 23.41 kg/m        Assessment & Plan:   1. Vertigo    Meds ordered this encounter  Medications  . meclizine (ANTIVERT) 25 MG tablet    Sig: Take 1 tablet (25 mg total) by mouth 3 (three) times daily as needed for dizziness.   Dispense:  30 tablet    Refill:  0    Order Specific Question:   Supervising Provider    Answer:   VINCENT, CAROL L [[id number]]  . DISCONTD: methylPREDNISolone acetate (DEPO-MEDROL) injection 80 mg  . methylPREDNISolone acetate (DEPO-MEDROL) injection 40 mg   Force fluids RTO prn  [name] Daphine DeutscherMartin, [name]

## 2016-12-27 NOTE — Patient Instructions (Signed)

## 2017-01-03 ENCOUNTER — Telehealth: Payer: Self-pay | Admitting: Nurse Practitioner

## 2017-01-03 NOTE — Telephone Encounter (Signed)
Needs to take tylenol OTC

## 2017-01-03 NOTE — Telephone Encounter (Signed)
What symptoms do you have? diziness is a little better not as bad as it was, but can not get rid of headaches and sharp pain shooting through her head  How long have you been sick? A little over a week  Have you been seen for this problem? Yes medication was prescribed and it eased it but today it is so bad she is in bed  If your provider decides to give you a prescription, which pharmacy would you like for it to be sent to? [facility] [place]   Patient informed that this information will be sent to the clinical staff for review and that they should receive a follow up call.

## 2017-01-04 NOTE — Telephone Encounter (Signed)
Amy Chambers aware of tylenol for HA  Pt has taken Meclizine 3 times daily for dizziness since the 15th this has not cleared up, this is come better but not gone, when should they expect her to be completely better.

## 2017-01-05 NOTE — Telephone Encounter (Signed)
If no better by Monday may need referral to neurologist

## 2017-01-05 NOTE — Telephone Encounter (Signed)
Aware of provider feedback and will call us Tuesday if not any better since Monday is [place].

## 2017-01-10 ENCOUNTER — Telehealth: Payer: Self-pay | Admitting: Nurse Practitioner

## 2017-01-10 DIAGNOSIS — R519 Headache, unspecified: Secondary | ICD-10-CM

## 2017-01-10 DIAGNOSIS — R42 Dizziness and giddiness: Secondary | ICD-10-CM

## 2017-01-10 DIAGNOSIS — R51 Headache: Principal | ICD-10-CM

## 2017-01-10 NOTE — Telephone Encounter (Signed)
[name], daughter aware of CT order

## 2017-01-10 NOTE — Telephone Encounter (Signed)
Lets do a head ct

## 2017-01-13 ENCOUNTER — Ambulatory Visit (HOSPITAL_COMMUNITY)
Admission: RE | Admit: 2017-01-13 | Discharge: 2017-01-13 | Disposition: A | Discharge status: Home / Self Care | Payer: Medicare Other | Source: Ambulatory Visit | Attending: Nurse Practitioner | Admitting: Nurse Practitioner

## 2017-01-13 DIAGNOSIS — R42 Dizziness and giddiness: Secondary | ICD-10-CM | POA: Insufficient documentation

## 2017-01-13 DIAGNOSIS — R51 Headache: Secondary | ICD-10-CM | POA: Diagnosis not present

## 2017-01-13 DIAGNOSIS — R519 Headache, unspecified: Secondary | ICD-10-CM

## 2017-01-25 ENCOUNTER — Ambulatory Visit (HOSPITAL_COMMUNITY): Payer: Self-pay

## 2017-02-06 ENCOUNTER — Other Ambulatory Visit: Payer: Self-pay | Admitting: Nurse Practitioner

## 2017-03-06 ENCOUNTER — Other Ambulatory Visit: Payer: Self-pay | Admitting: Nurse Practitioner

## 2017-03-06 DIAGNOSIS — I10 Essential (primary) hypertension: Secondary | ICD-10-CM

## 2017-03-23 ENCOUNTER — Encounter: Payer: Self-pay | Admitting: Nurse Practitioner

## 2017-03-23 ENCOUNTER — Ambulatory Visit (INDEPENDENT_AMBULATORY_CARE_PROVIDER_SITE_OTHER): Payer: Medicare Other | Admitting: Nurse Practitioner

## 2017-03-23 VITALS — BP 143/72 | HR 68 | Temp 97.1°F | Ht <= 58 in | Wt 118.0 lb

## 2017-03-23 DIAGNOSIS — E782 Mixed hyperlipidemia: Secondary | ICD-10-CM | POA: Diagnosis not present

## 2017-03-23 DIAGNOSIS — K219 Gastro-esophageal reflux disease without esophagitis: Secondary | ICD-10-CM

## 2017-03-23 DIAGNOSIS — E039 Hypothyroidism, unspecified: Secondary | ICD-10-CM | POA: Diagnosis not present

## 2017-03-23 DIAGNOSIS — M8580 Other specified disorders of bone density and structure, unspecified site: Secondary | ICD-10-CM | POA: Diagnosis not present

## 2017-03-23 DIAGNOSIS — I10 Essential (primary) hypertension: Secondary | ICD-10-CM | POA: Diagnosis not present

## 2017-03-23 DIAGNOSIS — J410 Simple chronic bronchitis: Secondary | ICD-10-CM

## 2017-03-23 MED ORDER — OMEPRAZOLE 20 MG PO CPDR: 20.0000 mg | DELAYED_RELEASE_CAPSULE | Freq: Every day | ORAL | 1 refills | Status: DC

## 2017-03-23 MED ORDER — BUDESONIDE-FORMOTEROL FUMARATE 80-4.5 MCG/ACT IN AERO: 2.0000 | INHALATION_SPRAY | Freq: Two times a day (BID) | RESPIRATORY_TRACT | 5 refills | Status: DC

## 2017-03-23 MED ORDER — LEVOTHYROXINE SODIUM 75 MCG PO TABS: 75.0000 ug | ORAL_TABLET | Freq: Every day | ORAL | 1 refills | Status: DC

## 2017-03-23 MED ORDER — BENAZEPRIL HCL 20 MG PO TABS: 20.0000 mg | ORAL_TABLET | Freq: Every day | ORAL | 1 refills | Status: DC

## 2017-03-23 NOTE — Patient Instructions (Signed)

## 2017-03-23 NOTE — Progress Notes (Signed)
Subjective:    Patient ID: Amy Chambers, female    DOB: [birth year], 83 y.o.   MRN: [id number]  HPI  Amy Chambers is here today for follow up of chronic medical problem.  Outpatient Encounter Prescriptions as of 03/23/2017  Medication Sig  . aspirin 81 MG tablet Take 81 mg by mouth daily.  . benazepril (LOTENSIN) 20 MG tablet TAKE ONE TABLET BY MOUTH ONCE DAILY  . budesonide-formoterol (SYMBICORT) 80-4.5 MCG/ACT inhaler Inhale 2 puffs into the lungs 2 (two) times daily.  . calcium-vitamin D 250-100 MG-UNIT per tablet Take 1 tablet by mouth 2 (two) times daily.   . Cholecalciferol (VITAMIN D) 2000 UNITS CAPS Take by mouth. 1 daily    . GARLIC PO Take 1 tablet by mouth daily.  Marland Kitchen levothyroxine (SYNTHROID, LEVOTHROID) 75 MCG tablet TAKE ONE TABLET BY MOUTH ONCE DAILY  . meclizine (ANTIVERT) 25 MG tablet Take 1 tablet (25 mg total) by mouth 3 (three) times daily as needed for dizziness.  Marland Kitchen omeprazole (PRILOSEC) 20 MG capsule TAKE ONE CAPSULE BY MOUTH ONCE DAILY  . ondansetron (ZOFRAN) 4 MG tablet Take 1 tablet (4 mg total) by mouth every 8 (eight) hours as needed for nausea or vomiting.  . SYMBICORT 80-4.5 MCG/ACT inhaler INHALE TWO PUFFS BY MOUTH TWICE DAILY   No facility-administered encounter medications on file as of 03/23/2017.     1. Essential hypertension  No c/o chest pain, sob or headache. Does not check blood pressures at home  2. Gastroesophageal reflux disease without esophagitis  Avoid spicy foods Do not eat 2 hours prior to bedtime  3. Hypothyroidism, unspecified type  No problems  4. Osteopenia, unspecified location  Does very little weight bearing exericses  5. Mixed hyperlipidemia  Does not watch diet  6.      COPD          Uses symbicort- has dyspnea at times but no different then usual  New complaints: none today  Social history: Husband died a couple of months ago- she is doing ok but she really miss him    Review of Systems  Constitutional: Negative for  activity change and appetite change.  HENT: Negative.   Eyes: Negative for pain.  Respiratory: Negative for shortness of breath.   Cardiovascular: Negative for chest pain, palpitations and leg swelling.  Gastrointestinal: Negative for abdominal pain.  Endocrine: Negative for polydipsia.  Genitourinary: Negative.   Skin: Negative for rash.  Neurological: Negative for dizziness, weakness and headaches.  Hematological: Does not bruise/bleed easily.  Psychiatric/Behavioral: Negative.   All other systems reviewed and are negative.      Objective:   Physical Exam  Constitutional: She is oriented to person, place, and time. She appears well-developed and well-nourished.  HENT:  Nose: Nose normal.  Mouth/Throat: Oropharynx is clear and moist.  Eyes: EOM are normal.  Neck: Trachea normal, normal range of motion and full passive range of motion without pain. Neck supple. No JVD present. Carotid bruit is not present. No thyromegaly present.  Cardiovascular: Normal rate, regular rhythm, normal heart sounds and intact distal pulses.  Exam reveals no gallop and no friction rub.   No murmur heard. Pulmonary/Chest: Effort normal and breath sounds normal.  Abdominal: Soft. Bowel sounds are normal. She exhibits no distension and no mass. There is no tenderness.  Musculoskeletal: Normal range of motion.  Lymphadenopathy:    She has no cervical adenopathy.  Neurological: She is alert and oriented to person, place, and time. She has  normal reflexes.  Skin: Skin is warm and dry.  Psychiatric: She has a normal mood and affect. Her behavior is normal. Judgment and thought content normal.   BP (!) 143/72   Pulse 68   Temp (!) 97.1 F (36.2 C) (Oral)   Ht 4' 10"  (1.473 m)   Wt 118 lb (53.5 kg)   BMI 24.66 kg/m       Assessment & Plan:  1. Essential hypertension Low sodium diet - benazepril (LOTENSIN) 20 MG tablet; Take 1 tablet (20 mg total) by mouth daily.  Dispense: 90 tablet; Refill: 1 -  budesonide-formoterol (SYMBICORT) 80-4.5 MCG/ACT inhaler; Inhale 2 puffs into the lungs 2 (two) times daily.  Dispense: 10 g; Refill: 5 - CMP14+EGFR  2. Gastroesophageal reflux disease without esophagitis Avoid spicy foods Do not eat 2 hours prior to bedtime - omeprazole (PRILOSEC) 20 MG capsule; Take 1 capsule (20 mg total) by mouth daily.  Dispense: 90 capsule; Refill: 1  3. Hypothyroidism, unspecified type  4. Osteopenia, unspecified location Weight bearing exercise as can tolerate  5. Mixed hyperlipidemia Low fat diet - Lipid panel  6. Acquired hypothyroidism - levothyroxine (SYNTHROID, LEVOTHROID) 75 MCG tablet; Take 1 tablet (75 mcg total) by mouth daily.  Dispense: 90 tablet; Refill: 1  7. Simple chronic bronchitis ([place])    Labs pending Health maintenance reviewed Diet and exercise encouraged Continue all meds Follow up  In 5 month   [place], [name]

## 2017-03-24 LAB — THYROID PANEL WITH TSH
Free Thyroxine Index: 2.6 (ref 1.2–4.9)
T3 Uptake Ratio: 30 % (ref 24–39)
T4, Total: 8.6 ug/dL (ref 4.5–12.0)
TSH: 2.69 u[IU]/mL (ref 0.450–4.500)

## 2017-03-24 LAB — CMP14+EGFR
ALT: 14 IU/L (ref 0–32)
AST: 19 IU/L (ref 0–40)
Albumin/Globulin Ratio: 1.7 (ref 1.2–2.2)
Albumin: 4.1 g/dL (ref 3.5–4.7)
Alkaline Phosphatase: 89 IU/L (ref 39–117)
BILIRUBIN TOTAL: 0.4 mg/dL (ref 0.0–1.2)
BUN/Creatinine Ratio: 26 (ref 12–28)
BUN: 21 mg/dL (ref 8–27)
CHLORIDE: 100 mmol/L (ref 96–106)
CO2: 28 mmol/L (ref 20–29)
Calcium: 9.1 mg/dL (ref 8.7–10.3)
Creatinine, Ser: 0.8 mg/dL (ref 0.57–1.00)
GFR calc non Af Amer: 67 mL/min/{1.73_m2} (ref >59)
GFR, EST AFRICAN AMERICAN: 77 mL/min/{1.73_m2} (ref >59)
GLUCOSE: 93 mg/dL (ref 65–99)
Globulin, Total: 2.4 g/dL (ref 1.5–4.5)
Potassium: 4.3 mmol/L (ref 3.5–5.2)
Sodium: 138 mmol/L (ref 134–144)
TOTAL PROTEIN: 6.5 g/dL (ref 6.0–8.5)

## 2017-03-24 LAB — LIPID PANEL
CHOLESTEROL TOTAL: 167 mg/dL (ref 100–199)
Chol/HDL Ratio: 3.6 ratio (ref 0.0–4.4)
HDL: 46 mg/dL (ref >39)
LDL Calculated: 99 mg/dL (ref 0–99)
TRIGLYCERIDES: 109 mg/dL (ref 0–149)
VLDL CHOLESTEROL CAL: 22 mg/dL (ref 5–40)

## 2017-06-06 ENCOUNTER — Ambulatory Visit (INDEPENDENT_AMBULATORY_CARE_PROVIDER_SITE_OTHER): Payer: Medicare Other

## 2017-06-06 DIAGNOSIS — Z23 Encounter for immunization: Secondary | ICD-10-CM

## 2017-06-08 ENCOUNTER — Ambulatory Visit (INDEPENDENT_AMBULATORY_CARE_PROVIDER_SITE_OTHER): Payer: Medicare Other | Admitting: *Deleted

## 2017-06-08 ENCOUNTER — Encounter: Payer: Self-pay | Admitting: *Deleted

## 2017-06-08 VITALS — BP 139/69 | HR 68 | Ht <= 58 in | Wt 122.0 lb

## 2017-06-08 DIAGNOSIS — Z0000 Encounter for general adult medical examination without abnormal findings: Secondary | ICD-10-CM | POA: Diagnosis not present

## 2017-06-08 MED ORDER — ALBUTEROL SULFATE HFA 108 (90 BASE) MCG/ACT IN AERS: INHALATION_SPRAY | RESPIRATORY_TRACT | 0 refills | Status: DC

## 2017-06-08 NOTE — Patient Instructions (Signed)
  Ms. Amy Chambers , Thank you for taking time to come for your Medicare Wellness Visit. I appreciate your ongoing commitment to your health goals. Please review the following plan we discussed and let me know if I can assist you in the future.   These are the goals we discussed: Goals    . Exercise 150 minutes per week (moderate activity)       This is a list of the screening recommended for you and due dates:  Health Maintenance  Topic Date Due  . Tetanus Vaccine  01/29/2022  . Flu Shot  Completed  . DEXA scan (bone density measurement)  Completed  . Pneumonia vaccines  Completed   Docusate Sodium 100mg  1 to 3 times daily for constipation. Increase water intake. Use Aveeno (generic) daily moisturizing lotion with dimethicone skin protectant for dry skin Use saline nasal spray as often as needed.

## 2017-06-08 NOTE — Progress Notes (Signed)
Subjective:   Amy Chambers is a 84 y.o. female who presents for a subsequent Medicare Annual Wellness Visit. Amy Chambers has 2 adult daughters and one adult son. She is accompanied today by one of her daughters. Her husband recently passed away in Mar 11, 2023 of this year and she is adjusting to that. She lives at home alone but her son lives across the street and her daughters check in on her daily.   Review of Systems    Reports that health is about the same as last year.  Cardiac Risk Factors include: advanced age (>27men, >61 women);hypertension  GI: States that her stomach is making more noise than usual. Has had some constipation. Denies any persistent pain. States that if she has pain it goes away quickly lasting on a few seconds.  Skin: Pink, irregular patch of skin on right forearm that will scab-over and then fall off. No ulcerations, rawness, or drainage. Diffuse dry skin. Amy Chambers looked at the area and said that it didn't look cancerous. Will assess at next visit again.   Other systems negative today.      Objective:    Today's Vitals   06/08/17 1134  BP: 139/69  Pulse: 68  Weight: 122 lb (55.3 kg)  Height: 4' 9.5" (1.461 m)   Body mass index is 25.94 kg/m.   Current Medications (verified) Outpatient Encounter Prescriptions as of 06/08/2017  Medication Sig  . aspirin 81 MG tablet Take 81 mg by mouth daily.  . benazepril (LOTENSIN) 20 MG tablet Take 1 tablet (20 mg total) by mouth daily.  . budesonide-formoterol (SYMBICORT) 80-4.5 MCG/ACT inhaler Inhale 2 puffs into the lungs 2 (two) times daily.  . calcium-vitamin D 250-100 MG-UNIT per tablet Take 1 tablet by mouth 2 (two) times daily.   Marland Kitchen GARLIC PO Take 1 tablet by mouth daily.  Marland Kitchen levothyroxine (SYNTHROID, LEVOTHROID) 75 MCG tablet Take 1 tablet (75 mcg total) by mouth daily.  Marland Kitchen albuterol (PROAIR HFA) 108 (90 Base) MCG/ACT inhaler INHALE TWO PUFFS BY MOUTH EVERY 6 HOURS AS NEEDED FOR WHEEZING  . meclizine (ANTIVERT)  25 MG tablet Take 1 tablet (25 mg total) by mouth 3 (three) times daily as needed for dizziness. (Patient not taking: Reported on 06/08/2017)  . ondansetron (ZOFRAN) 4 MG tablet Take 1 tablet (4 mg total) by mouth every 8 (eight) hours as needed for nausea or vomiting. (Patient not taking: Reported on 06/08/2017)  . [DISCONTINUED] Cholecalciferol (VITAMIN D) 2000 UNITS CAPS Take by mouth. 1 daily    . [DISCONTINUED] omeprazole (PRILOSEC) 20 MG capsule Take 1 capsule (20 mg total) by mouth daily.   No facility-administered encounter medications on file as of 06/08/2017.     Allergies (verified) Crestor [rosuvastatin calcium] and Niaspan [niacin er]   History: Past Medical History:  Diagnosis Date  . Cataract   . GERD (gastroesophageal reflux disease)   . Hypertension   . Osteopenia   . Thyroid disease    Past Surgical History:  Procedure Laterality Date  . CATARACT EXTRACTION, BILATERAL     Family History  Problem Relation Age of Onset  . Diabetes Mother   . Cancer Brother   . Tuberculosis Father   . Early death Sister   . Early death Brother   . COPD Brother   . Emphysema Brother   . Early death Sister   . COPD Sister   . Emphysema Sister   . Aneurysm Sister   . Stroke Sister   . Cancer Sister   .  Hypertension Son   . Hypertension Daughter   . Fibromyalgia Daughter   . Hypertension Daughter    Social History   Occupational History  . Not on file.   Social History Main Topics  . Smoking status: Former Smoker    Quit date: 08/15/1994  . Smokeless tobacco: Former NeurosurgeonUser    Types: Chew    Quit date: 08/15/1998  . Alcohol use No  . Drug use: No  . Sexual activity: Yes    Tobacco Counseling No tobacco use  Activities of Daily Living In your present state of health, do you have any difficulty performing the following activities: 06/08/2017  Hearing? Y  Vision? N  Difficulty concentrating or making decisions? N  Walking or climbing stairs? N  Dressing or  bathing? N  Doing errands, shopping? N  Preparing Food and eating ? N  Using the Toilet? N  In the past six months, have you accidently leaked urine? N  Comment some leaking if bladder is over full  Do you have problems with loss of bowel control? N  Managing your Medications? N  Managing your Finances? N  Housekeeping or managing your Housekeeping? N  Some recent data might be hidden    Immunizations and Health Maintenance Immunization History  Administered Date(s) Administered  . Influenza Whole 05/13/2010  . Influenza, High Dose Seasonal PF 06/04/2015, 05/19/2016, 06/06/2017  . Influenza,inj,Quad PF,6+ Mos 06/07/2013, 06/16/2014  . Pneumococcal Conjugate-13 06/07/2013  . Pneumococcal Polysaccharide-23 04/15/2010  . Td 08/15/2001  . Tdap 01/30/2012   There are no preventive care reminders to display for this patient.  Patient Care Team: [name], [name], [name] as PCP - General (Nurse Practitioner)  Lorna Dibbleran, [place], OD - optometry  No hospitalizations, ER visits, or surgeries this past year.      Assessment:   This is a routine wellness examination for Amy Chambers.   Hearing/Vision screen No deficits noted during visit. Eye exam is up to date.   Dietary issues and exercise activities discussed: Current Exercise Habits: Home exercise routine (Patient stays busy around her home and walks at the grocery store), Type of exercise: walking, Time (Minutes): 30, Frequency (Times/Week): 4, Weekly Exercise (Minutes/Week): 120, Intensity: Mild, Exercise limited by: None identified  Goals    . Exercise 150 minutes per week (moderate activity)      Depression Screen PHQ 2/9 Scores 06/08/2017 03/23/2017 12/27/2016 09/20/2016 05/02/2016 03/12/2015 10/16/2014  PHQ - 2 Score 0 0 0 0 0 0 0    Fall Risk Fall Risk  06/08/2017 03/23/2017 12/27/2016 09/20/2016 05/02/2016  Falls in the past year? No No No No No    Cognitive Function: MMSE - Mini Mental State Exam 06/08/2017  Orientation to time 4    Orientation to Place 5  Registration 3  Attention/ Calculation 0  Attention/Calculation-comments not attempted  Recall 2  Language- name 2 objects 2  Language- repeat 1  Language- follow 3 step command 3  Language- read & follow direction 1  Write a sentence 1  Copy design 0  Total score 22        Screening Tests Health Maintenance  Topic Date Due  . TETANUS/TDAP  01/29/2022  . INFLUENZA VACCINE  Completed  . DEXA SCAN  Completed  . PNA vac Low Risk Adult  Completed      Plan:  Docusate sodium 100mg  1 to 3 pills daily.  Increase water intake.  Aveeno (generic) daily moisturizing lotion with skin protectant for dry skin.  Keep f/u with PCP  Move carefully to avoid falls.  Continue to stay active. Aim for at least 150 min of moderate activity weekly.    I have personally reviewed and noted the following in the patient's chart:   . Medical and social history . Use of alcohol, tobacco or illicit drugs  . Current medications and supplements . Functional ability and status . Nutritional status . Physical activity . Advanced directives . List of other physicians . Hospitalizations, surgeries, and ER visits in previous 12 months . Vitals . Screenings to include cognitive, depression, and falls . Referrals and appointments  In addition, I have reviewed and discussed with patient certain preventive protocols, quality metrics, and best practice recommendations. A written personalized care plan for preventive services as well as general preventive health recommendations were provided to patient.     Demetrios Loll, RN  06/08/2017   I have reviewed and agree with the above AWV documentation.   [name] [name], [name]

## 2017-09-21 ENCOUNTER — Ambulatory Visit (INDEPENDENT_AMBULATORY_CARE_PROVIDER_SITE_OTHER): Payer: Medicare Other | Admitting: Nurse Practitioner

## 2017-09-21 ENCOUNTER — Ambulatory Visit (INDEPENDENT_AMBULATORY_CARE_PROVIDER_SITE_OTHER): Payer: Medicare Other

## 2017-09-21 ENCOUNTER — Encounter: Payer: Self-pay | Admitting: Nurse Practitioner

## 2017-09-21 VITALS — BP 123/72 | HR 68 | Temp 97.6°F | Ht <= 58 in | Wt 122.0 lb

## 2017-09-21 DIAGNOSIS — K219 Gastro-esophageal reflux disease without esophagitis: Secondary | ICD-10-CM

## 2017-09-21 DIAGNOSIS — J410 Simple chronic bronchitis: Secondary | ICD-10-CM | POA: Diagnosis not present

## 2017-09-21 DIAGNOSIS — M8580 Other specified disorders of bone density and structure, unspecified site: Secondary | ICD-10-CM

## 2017-09-21 DIAGNOSIS — E782 Mixed hyperlipidemia: Secondary | ICD-10-CM

## 2017-09-21 DIAGNOSIS — I10 Essential (primary) hypertension: Secondary | ICD-10-CM | POA: Diagnosis not present

## 2017-09-21 DIAGNOSIS — E039 Hypothyroidism, unspecified: Secondary | ICD-10-CM

## 2017-09-21 DIAGNOSIS — R0789 Other chest pain: Secondary | ICD-10-CM | POA: Diagnosis not present

## 2017-09-21 MED ORDER — BUDESONIDE-FORMOTEROL FUMARATE 160-4.5 MCG/ACT IN AERO: 2.0000 | INHALATION_SPRAY | Freq: Two times a day (BID) | RESPIRATORY_TRACT | 3 refills | Status: DC

## 2017-09-21 MED ORDER — BENAZEPRIL HCL 20 MG PO TABS: 20.0000 mg | ORAL_TABLET | Freq: Every day | ORAL | 1 refills | Status: DC

## 2017-09-21 MED ORDER — LEVOTHYROXINE SODIUM 75 MCG PO TABS: 75.0000 ug | ORAL_TABLET | Freq: Every day | ORAL | 1 refills | Status: DC

## 2017-09-21 NOTE — Progress Notes (Addendum)
Subjective:    Patient ID: Diamond Nickel, female    DOB: 1928-07-22, 84 y.o.   MRN: [id number]  HPI  ALYNAH SCHONE is here today for follow up of chronic medical problem.  Outpatient Encounter Medications as of 09/21/2017  Medication Sig  . albuterol (PROAIR HFA) 108 (90 Base) MCG/ACT inhaler INHALE TWO PUFFS BY MOUTH EVERY 6 HOURS AS NEEDED FOR WHEEZING  . aspirin 81 MG tablet Take 81 mg by mouth daily.  . benazepril (LOTENSIN) 20 MG tablet Take 1 tablet (20 mg total) by mouth daily.  . budesonide-formoterol (SYMBICORT) 80-4.5 MCG/ACT inhaler Inhale 2 puffs into the lungs 2 (two) times daily.  . calcium-vitamin D 250-100 MG-UNIT per tablet Take 1 tablet by mouth 2 (two) times daily.   Marland Kitchen levothyroxine (SYNTHROID, LEVOTHROID) 75 MCG tablet Take 1 tablet (75 mcg total) by mouth daily.  . meclizine (ANTIVERT) 25 MG tablet Take 1 tablet (25 mg total) by mouth 3 (three) times daily as needed for dizziness. (Patient not taking: Reported on 06/08/2017)  . ondansetron (ZOFRAN) 4 MG tablet Take 1 tablet (4 mg total) by mouth every 8 (eight) hours as needed for nausea or vomiting. (Patient not taking: Reported on 06/08/2017)   1. Essential hypertension  No c/o chest pain,  or headache. Does not check blood pressure at home. BP Readings from Last 3 Encounters:  09/21/17 123/72  06/08/17 139/69  03/23/17 (!) 143/72     2. Gastroesophageal reflux disease without esophagitis  Currently not on any meds  3. Hypothyroidism, unspecified type  No problems that she is aware of  4. Osteopenia, unspecified location  No c/o back pain  5. Mixed hyperlipidemia  Not watching diet  6.      COPD          Uses symbicort daily and does not seem to e working as well. Has not          needed albuterol asof late  New complaints: None today  Social history: Lives alone and family checks on her daily   Review of Systems  Constitutional: Negative for activity change and appetite change.  HENT: Negative.     Eyes: Negative for pain.  Respiratory: Positive for shortness of breath.   Cardiovascular: Negative for chest pain, palpitations and leg swelling.  Gastrointestinal: Negative for abdominal pain.  Endocrine: Negative for polydipsia.  Genitourinary: Negative.   Skin: Negative for rash.  Neurological: Negative for dizziness, weakness and headaches.  Hematological: Does not bruise/bleed easily.  Psychiatric/Behavioral: Negative.   All other systems reviewed and are negative.      Objective:   Physical Exam  Constitutional: She is oriented to person, place, and time. She appears well-developed and well-nourished.  HENT:  Nose: Nose normal.  Mouth/Throat: Oropharynx is clear and moist.  Eyes: EOM are normal.  Neck: Trachea normal, normal range of motion and full passive range of motion without pain. Neck supple. No JVD present. Carotid bruit is not present. No thyromegaly present.  Cardiovascular: Normal rate, regular rhythm, normal heart sounds and intact distal pulses. Exam reveals no gallop and no friction rub.  No murmur heard. Pulmonary/Chest: Effort normal. She has wheezes (exp wheezes throughout).  Diminished breath sounds bil bases  Abdominal: Soft. Bowel sounds are normal. She exhibits no distension and no mass. There is no tenderness.  Musculoskeletal: Normal range of motion.  Lymphadenopathy:    She has no cervical adenopathy.  Neurological: She is alert and oriented to person, place, and time.  She has normal reflexes.  Skin: Skin is warm and dry.  Psychiatric: She has a normal mood and affect. Her behavior is normal. Judgment and thought content normal.    BP 123/72   Pulse 68   Temp 97.6 F (36.4 C) (Oral)   Ht 4' 9"  (1.448 m)   Wt 122 lb (55.3 kg)   BMI 26.40 kg/m   ekg- nsr-[name] Hassell Done, [name]  Chest x ray- chronic bronchitis-Preliminary reading by Ronnald Collum, [name]  [facility]      Assessment & Plan:  1. Essential hypertension Low sodium diet -  CMP14+EGFR - EKG 12-Lead - benazepril (LOTENSIN) 20 MG tablet; Take 1 tablet (20 mg total) by mouth daily.  Dispense: 90 tablet; Refill: 1  2. Gastroesophageal reflux disease without esophagitis Avoid spicy foods Do not eat 2 hours prior to bedtime  3. Hypothyroidism, unspecified type - Thyroid Panel With TSH- levothyroxine (SYNTHROID, LEVOTHROID) 75 MCG tablet; Take 1 tablet (75 mcg total) by mouth daily.  Dispense: 90 tablet; Refill: 1  4. Osteopenia, unspecified location Weight bearing exercises as can tolerate  5. Mixed hyperlipidemia Low fat diet - Lipid panel  6. Simple chronic bronchitis ([facility]) Increased symbicort dose to 160/4.5 2 puffs bid - DG Chest 2 View; Future - budesonide-formoterol (SYMBICORT) 160-4.5 MCG/ACT inhaler; Inhale 2 puffs into the lungs 2 (two) times daily.  Dispense: 1 Inhaler; Refill: 3    Labs pending Health maintenance reviewed Diet and exercise encouraged Continue all meds Follow up  In 6 month   [place], [name]

## 2017-09-21 NOTE — Patient Instructions (Signed)

## 2017-09-22 LAB — LIPID PANEL
CHOLESTEROL TOTAL: 203 mg/dL — AB (ref 100–199)
Chol/HDL Ratio: 4.3 ratio (ref 0.0–4.4)
HDL: 47 mg/dL (ref >39)
LDL Calculated: 123 mg/dL — ABNORMAL HIGH (ref 0–99)
Triglycerides: 166 mg/dL — ABNORMAL HIGH (ref 0–149)
VLDL CHOLESTEROL CAL: 33 mg/dL (ref 5–40)

## 2017-09-22 LAB — CMP14+EGFR
A/G RATIO: 1.6 (ref 1.2–2.2)
ALK PHOS: 82 IU/L (ref 39–117)
ALT: 17 IU/L (ref 0–32)
AST: 19 IU/L (ref 0–40)
Albumin: 4.4 g/dL (ref 3.5–4.7)
BILIRUBIN TOTAL: 0.5 mg/dL (ref 0.0–1.2)
BUN/Creatinine Ratio: 29 — ABNORMAL HIGH (ref 12–28)
BUN: 25 mg/dL (ref 8–27)
CHLORIDE: 101 mmol/L (ref 96–106)
CO2: 25 mmol/L (ref 20–29)
Calcium: 9.5 mg/dL (ref 8.7–10.3)
Creatinine, Ser: 0.86 mg/dL (ref 0.57–1.00)
GFR calc Af Amer: 70 mL/min/{1.73_m2} (ref >59)
GFR calc non Af Amer: 61 mL/min/{1.73_m2} (ref >59)
GLUCOSE: 96 mg/dL (ref 65–99)
Globulin, Total: 2.8 g/dL (ref 1.5–4.5)
POTASSIUM: 4.3 mmol/L (ref 3.5–5.2)
Sodium: 142 mmol/L (ref 134–144)
Total Protein: 7.2 g/dL (ref 6.0–8.5)

## 2017-09-22 LAB — THYROID PANEL WITH TSH
FREE THYROXINE INDEX: 3.1 (ref 1.2–4.9)
T3 Uptake Ratio: 32 % (ref 24–39)
T4, Total: 9.6 ug/dL (ref 4.5–12.0)
TSH: 0.997 u[IU]/mL (ref 0.450–4.500)

## 2017-10-26 DIAGNOSIS — I10 Essential (primary) hypertension: Secondary | ICD-10-CM | POA: Diagnosis not present

## 2017-10-26 DIAGNOSIS — H35033 Hypertensive retinopathy, bilateral: Secondary | ICD-10-CM | POA: Diagnosis not present

## 2017-10-26 DIAGNOSIS — H43813 Vitreous degeneration, bilateral: Secondary | ICD-10-CM | POA: Diagnosis not present

## 2017-10-26 DIAGNOSIS — H04123 Dry eye syndrome of bilateral lacrimal glands: Secondary | ICD-10-CM | POA: Diagnosis not present

## 2017-12-25 ENCOUNTER — Encounter: Payer: Self-pay | Admitting: Nurse Practitioner

## 2017-12-25 ENCOUNTER — Ambulatory Visit (INDEPENDENT_AMBULATORY_CARE_PROVIDER_SITE_OTHER): Payer: Medicare Other | Admitting: Nurse Practitioner

## 2017-12-25 ENCOUNTER — Ambulatory Visit (INDEPENDENT_AMBULATORY_CARE_PROVIDER_SITE_OTHER): Payer: Medicare Other

## 2017-12-25 VITALS — BP 128/64 | HR 68 | Temp 97.3°F | Ht <= 58 in | Wt 123.5 lb

## 2017-12-25 DIAGNOSIS — R1084 Generalized abdominal pain: Secondary | ICD-10-CM | POA: Diagnosis not present

## 2017-12-25 DIAGNOSIS — R110 Nausea: Secondary | ICD-10-CM

## 2017-12-25 DIAGNOSIS — K5900 Constipation, unspecified: Secondary | ICD-10-CM | POA: Diagnosis not present

## 2017-12-25 DIAGNOSIS — R1030 Lower abdominal pain, unspecified: Secondary | ICD-10-CM | POA: Diagnosis not present

## 2017-12-25 NOTE — Progress Notes (Signed)
   Subjective:    Patient ID: Amy Chambers, female    DOB: [birth year], 84 y.o.   MRN: [id number]  Chief Complaint: Nausea (pt here today c/o constant nausea especially after eating)   HPI Patient is brought in by her daughter with patient c/o nausea and vomiting 1 week ago with diarrhea. She has felt nauseous since then. Says she has stomach pains that comes an go daily. The diarrhea has resolve, but stools  Are soft.    Review of Systems  Constitutional: Positive for appetite change (decreased).  HENT: Negative.   Respiratory: Negative.   Cardiovascular: Negative.   Gastrointestinal: Positive for abdominal pain and nausea.  Genitourinary: Negative.   Neurological: Negative.   Psychiatric/Behavioral: Negative.   All other systems reviewed and are negative.      Objective:   Physical Exam  Constitutional: She is oriented to person, place, and time. She appears well-developed and well-nourished.  Cardiovascular: Normal rate.  Pulmonary/Chest: Effort normal.  Abdominal: Soft. Bowel sounds are normal. She exhibits distension (mild). She exhibits no mass. There is no tenderness. There is no guarding.  Neurological: She is alert and oriented to person, place, and time.  Skin: Skin is warm.   BP 128/64   Pulse 68   Temp (!) 97.3 F (36.3 C) (Oral)   Ht '4\' 9"'$  (1.448 m)   Wt 123 lb 8 oz (56 kg)   BMI 26.73 kg/m   KUB- moderate stool burden-Preliminary reading by Ronnald Collum, [name]  [facility]     Assessment & Plan:  Amy Chambers in today with chief complaint of Nausea (pt here today c/o constant nausea especially after eating)   1. Nausea  2. Generalized abdominal pain - DG Abd 1 View; Future - CMP14+EGFR - Amylase - Lipase  3. Constipation, unspecified constipation type Prune juice and milk of magensia Increase fiber  in diet  Will call with radiology report  [name] Hassell Done, [name]

## 2017-12-25 NOTE — Patient Instructions (Signed)

## 2017-12-26 LAB — CMP14+EGFR
ALT: 12 IU/L (ref 0–32)
AST: 15 IU/L (ref 0–40)
Albumin/Globulin Ratio: 1.8 (ref 1.2–2.2)
Albumin: 4.1 g/dL (ref 3.5–4.7)
Alkaline Phosphatase: 78 IU/L (ref 39–117)
BUN/Creatinine Ratio: 22 (ref 12–28)
BUN: 20 mg/dL (ref 8–27)
Bilirubin Total: 0.2 mg/dL (ref 0.0–1.2)
CALCIUM: 9.3 mg/dL (ref 8.7–10.3)
CHLORIDE: 103 mmol/L (ref 96–106)
CO2: 24 mmol/L (ref 20–29)
Creatinine, Ser: 0.89 mg/dL (ref 0.57–1.00)
GFR calc non Af Amer: 58 mL/min/{1.73_m2} — ABNORMAL LOW (ref >59)
GFR, EST AFRICAN AMERICAN: 67 mL/min/{1.73_m2} (ref >59)
Globulin, Total: 2.3 g/dL (ref 1.5–4.5)
Glucose: 104 mg/dL — ABNORMAL HIGH (ref 65–99)
Potassium: 4.7 mmol/L (ref 3.5–5.2)
Sodium: 143 mmol/L (ref 134–144)
TOTAL PROTEIN: 6.4 g/dL (ref 6.0–8.5)

## 2017-12-26 LAB — LIPASE: LIPASE: 34 U/L (ref 14–85)

## 2017-12-26 LAB — AMYLASE: Amylase: 81 U/L (ref 31–124)

## 2018-01-05 ENCOUNTER — Ambulatory Visit (INDEPENDENT_AMBULATORY_CARE_PROVIDER_SITE_OTHER): Payer: Medicare Other

## 2018-01-05 ENCOUNTER — Ambulatory Visit (INDEPENDENT_AMBULATORY_CARE_PROVIDER_SITE_OTHER): Payer: Medicare Other | Admitting: Nurse Practitioner

## 2018-01-05 ENCOUNTER — Encounter: Payer: Self-pay | Admitting: Nurse Practitioner

## 2018-01-05 VITALS — BP 114/66 | HR 73 | Temp 96.9°F | Ht <= 58 in | Wt 123.0 lb

## 2018-01-05 DIAGNOSIS — K5901 Slow transit constipation: Secondary | ICD-10-CM

## 2018-01-05 DIAGNOSIS — K5900 Constipation, unspecified: Secondary | ICD-10-CM | POA: Diagnosis not present

## 2018-01-05 NOTE — Patient Instructions (Signed)

## 2018-01-05 NOTE — Progress Notes (Signed)
   Subjective:    Patient ID: Amy Chambers, female    DOB: 14-Aug-1928, 84 y.o.   MRN: [id number]   Chief Complaint: Constipation (Seen 5/13 and no better)   HPI Patient is brought in by her daughter. Patient is c/o constipation. Stools are hard and can only get a little out at time. She has used durlax and, mmirlax and milk of magnesia. Increased fibrin diet an dhas been drinking lot sof fluids. Having abdminal pain.   Review of Systems  Respiratory: Negative.   Cardiovascular: Negative.   Gastrointestinal: Positive for abdominal pain and constipation.  Genitourinary: Negative.   Neurological: Negative.   Psychiatric/Behavioral: Negative.   All other systems reviewed and are negative.      Objective:   Physical Exam  Constitutional: She appears well-developed and well-nourished.  Cardiovascular: Normal rate.  Pulmonary/Chest: Effort normal.  Neurological: She is alert.  Skin: Skin is warm.  Nursing note and vitals reviewed.  BP 114/66   Pulse 73   Temp (!) 96.9 F (36.1 C) (Oral)   Ht  (1.448 m)   Wt 123 lb (55.8 kg)   BMI 26.62 kg/m   KUB- moderate amout of stool burden in colon-Preliminary reading by Paulene Floor, [name]  [facility]        Assessment & Plan:  Amy Chambers in today with chief complaint of Constipation (Seen 5/13 and no better)   1. Slow transit constipation Mag citrate 1/2 bottle no results in 10-12 hours then drink other half Continue daily miralax RTO prn - DG Abd 1 View; Future  [name] [name], [name]

## 2018-01-25 DIAGNOSIS — Z0289 Encounter for other administrative examinations: Secondary | ICD-10-CM

## 2018-03-21 ENCOUNTER — Ambulatory Visit (INDEPENDENT_AMBULATORY_CARE_PROVIDER_SITE_OTHER): Payer: Medicare Other | Admitting: Pediatrics

## 2018-03-21 ENCOUNTER — Encounter: Payer: Self-pay | Admitting: Pediatrics

## 2018-03-21 VITALS — BP 138/74 | HR 73 | Temp 97.2°F | Ht <= 58 in | Wt 125.0 lb

## 2018-03-21 DIAGNOSIS — R350 Frequency of micturition: Secondary | ICD-10-CM | POA: Diagnosis not present

## 2018-03-21 DIAGNOSIS — K5900 Constipation, unspecified: Secondary | ICD-10-CM

## 2018-03-21 DIAGNOSIS — K219 Gastro-esophageal reflux disease without esophagitis: Secondary | ICD-10-CM

## 2018-03-21 DIAGNOSIS — N3090 Cystitis, unspecified without hematuria: Secondary | ICD-10-CM | POA: Diagnosis not present

## 2018-03-21 LAB — URINALYSIS, COMPLETE
Bilirubin, UA: NEGATIVE
Glucose, UA: NEGATIVE
Ketones, UA: NEGATIVE
Nitrite, UA: NEGATIVE
Protein, UA: NEGATIVE
RBC, UA: NEGATIVE
Specific Gravity, UA: 1.015 (ref 1.005–1.030)
Urobilinogen, Ur: 0.2 mg/dL (ref 0.2–1.0)
pH, UA: 7.5 (ref 5.0–7.5)

## 2018-03-21 LAB — MICROSCOPIC EXAMINATION

## 2018-03-21 MED ORDER — NITROFURANTOIN MONOHYD MACRO 100 MG PO CAPS: 100.0000 mg | ORAL_CAPSULE | Freq: Two times a day (BID) | ORAL | 0 refills | Status: AC

## 2018-03-21 MED ORDER — OMEPRAZOLE 20 MG PO CPDR: 20.0000 mg | DELAYED_RELEASE_CAPSULE | Freq: Every day | ORAL | 3 refills | Status: DC | PRN

## 2018-03-21 NOTE — Progress Notes (Signed)
  Subjective:   Patient ID: Amy Chambers, female    DOB: Dec 31, 1927, 84 y.o.   MRN: [id number] CC: Abdominal Pain and Urinary Frequency  HPI: Amy Chambers is a 84 y.o. female   Here today with her daughter.  Frequent urination and some dysuria starting 2 days ago.  She has been constipated recently, has increased her fiber intake, taking MiraLAX daily.  She has 2-3 soft or loose stools a day but she feels like she is not passing enough stool.  She has some abdominal discomfort she thinks from the constipation.  She is not drinking much fluid during the day.  A couple coffee in the morning, some water and apple juice during the day.  No fevers, appetite is been fine.  GERD: About once a week she does feel like she has reflux.  She used to take omeprazole for this with good improvement in symptoms.  She has for another refill.  Relevant past medical, surgical, family and social history reviewed. Allergies and medications reviewed and updated. Social History   Tobacco Use  Smoking Status Former Smoker  . Last attempt to quit: 08/15/1994  . Years since quitting: 23.6  Smokeless Tobacco Former NeurosurgeonUser  . Types: Chew  . Quit date: 08/15/1998   ROS: Per HPI   Objective:    BP 138/74   Pulse 73   Temp (!) 97.2 F (36.2 C) (Oral)   Ht 4\' 9"  (1.448 m)   Wt 125 lb (56.7 kg)   BMI 27.05 kg/m   Wt Readings from Last 3 Encounters:  03/21/18 125 lb (56.7 kg)  01/05/18 123 lb (55.8 kg)  12/25/17 123 lb 8 oz (56 kg)    Gen: NAD, alert, cooperative with exam, NCAT EYES: EOMI, no conjunctival injection, or no icterus ENT: OP without erythema LYMPH: no cervical LAD CV: NRRR, normal S1/S2, no murmur, distal pulses 2+ b/l Resp: CTABL, no wheezes, normal WOB Abd: +BS, soft, NTND.  Ext: No edema, warm Neuro: Alert and oriented  Assessment & Plan:  Amy Chambers was seen today for abdominal pain and urinary frequency.  Diagnoses and all orders for this visit:  Cystitis UA positive, will treat with  below and follow-up urine culture. -     Urine Culture -     nitrofurantoin, macrocrystal-monohydrate, (MACROBID) 100 MG capsule; Take 1 capsule (100 mg total) by mouth 2 (two) times daily for 5 days.  Frequent urination -     Urinalysis, Complete  Gastroesophageal reflux disease, esophagitis presence not specified Take below as needed for reflux symptoms.  If not improving let me know. -     omeprazole (PRILOSEC) 20 MG capsule; Take 1 capsule (20 mg total) by mouth daily as needed.  Constipation, unspecified constipation type MiraLAX 1-2 times every day.  Increase fluid intake.  Continue high-fiber diet.  If symptoms ongoing let us know.  Follow up plan: Return if symptoms worsen or fail to improve. Rex Krasarol Mimie Goering, MD [facility]

## 2018-03-24 LAB — URINE CULTURE

## 2018-05-24 ENCOUNTER — Ambulatory Visit (INDEPENDENT_AMBULATORY_CARE_PROVIDER_SITE_OTHER): Payer: Medicare Other | Admitting: Nurse Practitioner

## 2018-05-24 ENCOUNTER — Encounter: Payer: Self-pay | Admitting: Nurse Practitioner

## 2018-05-24 VITALS — BP 140/72 | HR 65 | Temp 96.8°F | Ht <= 58 in | Wt 122.0 lb

## 2018-05-24 DIAGNOSIS — K5904 Chronic idiopathic constipation: Secondary | ICD-10-CM

## 2018-05-24 DIAGNOSIS — I10 Essential (primary) hypertension: Secondary | ICD-10-CM | POA: Diagnosis not present

## 2018-05-24 DIAGNOSIS — K219 Gastro-esophageal reflux disease without esophagitis: Secondary | ICD-10-CM

## 2018-05-24 DIAGNOSIS — E039 Hypothyroidism, unspecified: Secondary | ICD-10-CM | POA: Diagnosis not present

## 2018-05-24 DIAGNOSIS — J410 Simple chronic bronchitis: Secondary | ICD-10-CM

## 2018-05-24 DIAGNOSIS — M8580 Other specified disorders of bone density and structure, unspecified site: Secondary | ICD-10-CM | POA: Diagnosis not present

## 2018-05-24 DIAGNOSIS — Z23 Encounter for immunization: Secondary | ICD-10-CM | POA: Diagnosis not present

## 2018-05-24 DIAGNOSIS — E782 Mixed hyperlipidemia: Secondary | ICD-10-CM

## 2018-05-24 MED ORDER — BUDESONIDE-FORMOTEROL FUMARATE 160-4.5 MCG/ACT IN AERO: 2.0000 | INHALATION_SPRAY | Freq: Two times a day (BID) | RESPIRATORY_TRACT | 3 refills | Status: DC

## 2018-05-24 MED ORDER — BENAZEPRIL HCL 20 MG PO TABS: 20.0000 mg | ORAL_TABLET | Freq: Every day | ORAL | 1 refills | Status: DC

## 2018-05-24 MED ORDER — LINACLOTIDE 145 MCG PO CAPS: 145.0000 ug | ORAL_CAPSULE | Freq: Every day | ORAL | 0 refills | Status: DC

## 2018-05-24 MED ORDER — OMEPRAZOLE 20 MG PO CPDR: 20.0000 mg | DELAYED_RELEASE_CAPSULE | Freq: Every day | ORAL | 3 refills | Status: DC | PRN

## 2018-05-24 MED ORDER — LEVOTHYROXINE SODIUM 75 MCG PO TABS: 75.0000 ug | ORAL_TABLET | Freq: Every day | ORAL | 1 refills | Status: DC

## 2018-05-24 NOTE — Patient Instructions (Signed)

## 2018-05-24 NOTE — Progress Notes (Signed)
 Subjective:    Patient ID: Amy Chambers, female    DOB: 09/19/1927, 84 y.o.   MRN: [id number]   Chief Complaint: Medical Management of Chronic Issues   HPI:  1. Essential hypertension  No c/o chest pain, sob or headache. Does not check blood pressure at home. BP Readings from Last 3 Encounters:  05/24/18 140/72  03/21/18 138/74  01/05/18 114/66     2. Gastroesophageal reflux disease without esophagitis  Omeprazole works well to keep symptoms under control  3. Hypothyroidism, unspecified type  No problems that aware of.  4. Osteopenia, unspecified location  Last dexascan was 04/23/14. She does not want to do these anymore  5. Mixed hyperlipidemia  Tries to watch diet. No exercise  6.      COPD           Use to smoke. Uses symbicort daily. No c/o cough or SOB  Outpatient Encounter Medications as of 05/24/2018  Medication Sig  . albuterol (PROAIR HFA) 108 (90 Base) MCG/ACT inhaler INHALE TWO PUFFS BY MOUTH EVERY 6 HOURS AS NEEDED FOR WHEEZING  . aspirin 81 MG tablet Take 81 mg by mouth daily.  . benazepril (LOTENSIN) 20 MG tablet Take 1 tablet (20 mg total) by mouth daily.  . budesonide-formoterol (SYMBICORT) 160-4.5 MCG/ACT inhaler Inhale 2 puffs into the lungs 2 (two) times daily.  . calcium-vitamin D 250-100 MG-UNIT per tablet Take 1 tablet by mouth 2 (two) times daily.   . levothyroxine (SYNTHROID, LEVOTHROID) 75 MCG tablet Take 1 tablet (75 mcg total) by mouth daily.  . meclizine (ANTIVERT) 25 MG tablet Take 1 tablet (25 mg total) by mouth 3 (three) times daily as needed for dizziness.  . omeprazole (PRILOSEC) 20 MG capsule Take 1 capsule (20 mg total) by mouth daily as needed.       New complaints: She is still having constipation. Making her not want to eat well. She has tried mag sulfate and MOM and only stained water comes out. Social history: Still lives by herself, family checks on her frequently.   Review of Systems  Constitutional: Negative for activity  change and appetite change.  HENT: Negative.   Eyes: Negative for pain.  Respiratory: Negative for shortness of breath.   Cardiovascular: Negative for chest pain, palpitations and leg swelling.  Gastrointestinal: Negative for abdominal pain.  Endocrine: Negative for polydipsia.  Genitourinary: Negative.   Skin: Negative for rash.  Neurological: Negative for dizziness, weakness and headaches.  Hematological: Does not bruise/bleed easily.  Psychiatric/Behavioral: Negative.   All other systems reviewed and are negative.      Objective:   Physical Exam  Constitutional: She is oriented to person, place, and time. She appears well-developed and well-nourished. No distress.  HENT:  Head: Normocephalic.  Nose: Nose normal.  Mouth/Throat: Oropharynx is clear and moist.  Eyes: Pupils are equal, round, and reactive to light. EOM are normal.  Neck: Normal range of motion. Neck supple. No JVD present. Carotid bruit is not present.  Cardiovascular: Normal rate, regular rhythm, normal heart sounds and intact distal pulses.  Pulmonary/Chest: Effort normal and breath sounds normal. No respiratory distress. She has no wheezes. She has no rales. She exhibits no tenderness.  Abdominal: Soft. Normal appearance, normal aorta and bowel sounds are normal. She exhibits no distension, no abdominal bruit, no pulsatile midline mass and no mass. There is no splenomegaly or hepatomegaly. There is tenderness (mild tenderness).  Musculoskeletal: Normal range of motion. She exhibits no edema.  Lymphadenopathy:      She has no cervical adenopathy.  Neurological: She is alert and oriented to person, place, and time. She has normal reflexes.  Skin: Skin is warm and dry.  Psychiatric: She has a normal mood and affect. Her behavior is normal. Judgment and thought content normal.  Nursing note and vitals reviewed.  BP 140/72   Pulse 65   Temp (!) 96.8 F (36 C) (Oral)   Ht 4' 9" (1.448 m)   Wt 122 lb (55.3 kg)    BMI 26.40 kg/m       Assessment & Plan:  Vertie M Budde comes in today with chief complaint of Medical Management of Chronic Issues   Diagnosis and orders addressed:  1. Essential hypertension Low sodium diet - CMP14+EGFR - benazepril (LOTENSIN) 20 MG tablet; Take 1 tablet (20 mg total) by mouth daily.  Dispense: 90 tablet; Refill: 1  2. Gastroesophageal reflux disease without esophagitis Avoid spicy foods Do not eat 2 hours prior to bedtime  3. Hypothyroidism, unspecified type - Thyroid Panel With TSH  4. Osteopenia, unspecified location Weight bearing exercise if can tolerate No more dexascan due to age  5. Mixed hyperlipidemia Low fat diet - Lipid panel  6. Chronic idiopathic constipation Increase fiber in diet Force fluids Gong to try linzess- samples given - linaclotide (LINZESS) 145 MCG CAPS capsule; Take 1 capsule (145 mcg total) by mouth daily before breakfast.  Dispense: 30 capsule; Refill: 0  7. Acquired hypothyroidism - levothyroxine (SYNTHROID, LEVOTHROID) 75 MCG tablet; Take 1 tablet (75 mcg total) by mouth daily.  Dispense: 90 tablet; Refill: 1  8. Gastroesophageal reflux disease, esophagitis presence not specified Avoid spicy foods Do not eat 2 hours prior to bedtime - omeprazole (PRILOSEC) 20 MG capsule; Take 1 capsule (20 mg total) by mouth daily as needed.  Dispense: 30 capsule; Refill: 3  9. Simple chronic bronchitis (HCC) - budesonide-formoterol (SYMBICORT) 160-4.5 MCG/ACT inhaler; Inhale 2 puffs into the lungs 2 (two) times daily.  Dispense: 1 Inhaler; Refill: 3   Labs pending Health Maintenance reviewed Diet and exercise encouraged  Follow up plan: 3 months   [name] [name], [name]  

## 2018-05-25 LAB — CMP14+EGFR
ALK PHOS: 76 IU/L (ref 39–117)
ALT: 11 IU/L (ref 0–32)
AST: 13 IU/L (ref 0–40)
Albumin/Globulin Ratio: 1.7 (ref 1.2–2.2)
Albumin: 4 g/dL (ref 3.5–4.7)
BILIRUBIN TOTAL: 0.4 mg/dL (ref 0.0–1.2)
BUN/Creatinine Ratio: 28 (ref 12–28)
BUN: 20 mg/dL (ref 8–27)
CHLORIDE: 99 mmol/L (ref 96–106)
CO2: 25 mmol/L (ref 20–29)
Calcium: 9.2 mg/dL (ref 8.7–10.3)
Creatinine, Ser: 0.72 mg/dL (ref 0.57–1.00)
GFR calc Af Amer: 86 mL/min/{1.73_m2} (ref >59)
GFR calc non Af Amer: 75 mL/min/{1.73_m2} (ref >59)
GLOBULIN, TOTAL: 2.3 g/dL (ref 1.5–4.5)
Glucose: 87 mg/dL (ref 65–99)
Potassium: 4.4 mmol/L (ref 3.5–5.2)
SODIUM: 138 mmol/L (ref 134–144)
Total Protein: 6.3 g/dL (ref 6.0–8.5)

## 2018-05-25 LAB — LIPID PANEL
Chol/HDL Ratio: 4.5 ratio — ABNORMAL HIGH (ref 0.0–4.4)
Cholesterol, Total: 170 mg/dL (ref 100–199)
HDL: 38 mg/dL — AB (ref >39)
LDL Calculated: 99 mg/dL (ref 0–99)
TRIGLYCERIDES: 166 mg/dL — AB (ref 0–149)
VLDL Cholesterol Cal: 33 mg/dL (ref 5–40)

## 2018-05-25 LAB — THYROID PANEL WITH TSH
Free Thyroxine Index: 3.3 (ref 1.2–4.9)
T3 Uptake Ratio: 31 % (ref 24–39)
T4, Total: 10.8 ug/dL (ref 4.5–12.0)
TSH: 0.452 u[IU]/mL (ref 0.450–4.500)

## 2018-06-11 ENCOUNTER — Ambulatory Visit (INDEPENDENT_AMBULATORY_CARE_PROVIDER_SITE_OTHER): Payer: Medicare Other | Admitting: Physician Assistant

## 2018-06-11 ENCOUNTER — Encounter: Payer: Self-pay | Admitting: Physician Assistant

## 2018-06-11 VITALS — BP 136/71 | HR 75 | Temp 97.3°F | Ht <= 58 in | Wt 124.0 lb

## 2018-06-11 DIAGNOSIS — K5904 Chronic idiopathic constipation: Secondary | ICD-10-CM

## 2018-06-11 NOTE — Patient Instructions (Signed)
take Miralax, a powder that you mix in a clear liquid.   1. Stir the Miralax powder into water, juice, or Gatorade.   8 capfuls of Miralax powder in 32 ounces of liquid  2. Dink 4 to 8 ounces to drink every 30 minutes. It will take 4 to 6 hours until you finish the medicine. 3. After the medicine is gone, drink more water or juice. This will help with the cleanout.  After the clean out, your child will take a daily (maintenance) medicine for at least 6 months. 1 capful of powder in 8 ounces of liquid every day  You may have stomach pain or cramping during the clean out. Explain that the pain will go away when the stool is gone. A warm bath may also help.

## 2018-06-12 NOTE — Progress Notes (Signed)
BP 136/71   Pulse 75   Temp (!) 97.3 F (36.3 C) (Oral)   Ht 4' 9"  (1.448 m)   Wt 124 lb (56.2 kg)   BMI 26.83 kg/m    Subjective:    Patient ID: Amy Chambers, female    DOB: [birth year], 84 y.o.   MRN: [id number]  HPI: Amy Chambers is a 84 y.o. female presenting on 06/11/2018 for Constipation and Abdominal Pain  She has chronic constipation issues that has not been resolved with daily miralax and the addition of linzess.  She has signs of an impaction at this time.  She only has very small pieces of feces that will come out and has fecal soiling with brown watery stool.  She denies any blood or mucus at this time  Past Medical History:  Diagnosis Date  . Cataract   . GERD (gastroesophageal reflux disease)   . Hypertension   . Osteopenia   . Thyroid disease    Relevant past medical, surgical, family and social history reviewed and updated as indicated. Interim medical history since our last visit reviewed. Allergies and medications reviewed and updated. DATA REVIEWED: CHART IN EPIC  Family History reviewed for pertinent findings.  Review of Systems  Constitutional: Negative.   HENT: Negative.   Eyes: Negative.   Respiratory: Negative.   Gastrointestinal: Positive for abdominal distention, abdominal pain and constipation. Negative for diarrhea, nausea and rectal pain.  Genitourinary: Negative.     Allergies as of 06/11/2018      Reactions   Crestor [rosuvastatin Calcium]    Niaspan [niacin Er]       Medication List        Accurate as of 06/11/18 11:59 PM. Always use your most recent med list.          albuterol 108 (90 Base) MCG/ACT inhaler Commonly known as:  PROVENTIL HFA;VENTOLIN HFA INHALE TWO PUFFS BY MOUTH EVERY 6 HOURS AS NEEDED FOR WHEEZING   aspirin 81 MG tablet Take 81 mg by mouth daily.   benazepril 20 MG tablet Commonly known as:  LOTENSIN Take 1 tablet (20 mg total) by mouth daily.   budesonide-formoterol 160-4.5 MCG/ACT inhaler Commonly  known as:  SYMBICORT Inhale 2 puffs into the lungs 2 (two) times daily.   calcium-vitamin D 250-100 MG-UNIT tablet Take 1 tablet by mouth 2 (two) times daily.   levothyroxine 75 MCG tablet Commonly known as:  SYNTHROID, LEVOTHROID Take 1 tablet (75 mcg total) by mouth daily.   linaclotide 145 MCG Caps capsule Commonly known as:  LINZESS Take 1 capsule (145 mcg total) by mouth daily before breakfast.   meclizine 25 MG tablet Commonly known as:  ANTIVERT Take 1 tablet (25 mg total) by mouth 3 (three) times daily as needed for dizziness.   omeprazole 20 MG capsule Commonly known as:  PRILOSEC Take 1 capsule (20 mg total) by mouth daily as needed.          Objective:    BP 136/71   Pulse 75   Temp (!) 97.3 F (36.3 C) (Oral)   Ht 4' 9"  (1.448 m)   Wt 124 lb (56.2 kg)   BMI 26.83 kg/m   Allergies  Allergen Reactions  . Crestor [Rosuvastatin Calcium]   . Niaspan [Niacin Er]     Wt Readings from Last 3 Encounters:  06/11/18 124 lb (56.2 kg)  05/24/18 122 lb (55.3 kg)  03/21/18 125 lb (56.7 kg)    Physical Exam  Constitutional: She is  oriented to person, place, and time. She appears well-developed and well-nourished.  HENT:  Head: Normocephalic and atraumatic.  Eyes: Pupils are equal, round, and reactive to light. Conjunctivae and EOM are normal.  Cardiovascular: Normal rate, regular rhythm, normal heart sounds and intact distal pulses.  Pulmonary/Chest: Effort normal and breath sounds normal.  Abdominal: Soft. Bowel sounds are normal.  Neurological: She is alert and oriented to person, place, and time. She has normal reflexes.  Skin: Skin is warm and dry. No rash noted.  Psychiatric: She has a normal mood and affect. Her behavior is normal. Judgment and thought content normal.    Results for orders placed or performed in visit on 05/24/18  CMP14+EGFR  Result Value Ref Range   Glucose 87 65 - 99 mg/dL   BUN 20 8 - 27 mg/dL   Creatinine, Ser 0.72 0.57 - 1.00  mg/dL   GFR calc non Af Amer 75 >59 mL/min/1.73   GFR calc Af Amer 86 >59 mL/min/1.73   BUN/Creatinine Ratio 28 12 - 28   Sodium 138 134 - 144 mmol/L   Potassium 4.4 3.5 - 5.2 mmol/L   Chloride 99 96 - 106 mmol/L   CO2 25 20 - 29 mmol/L   Calcium 9.2 8.7 - 10.3 mg/dL   Total Protein 6.3 6.0 - 8.5 g/dL   Albumin 4.0 3.5 - 4.7 g/dL   Globulin, Total 2.3 1.5 - 4.5 g/dL   Albumin/Globulin Ratio 1.7 1.2 - 2.2   Bilirubin Total 0.4 0.0 - 1.2 mg/dL   Alkaline Phosphatase 76 39 - 117 IU/L   AST 13 0 - 40 IU/L   ALT 11 0 - 32 IU/L  Lipid panel  Result Value Ref Range   Cholesterol, Total 170 100 - 199 mg/dL   Triglycerides 166 (H) 0 - 149 mg/dL   HDL 38 (L) >39 mg/dL   VLDL Cholesterol Cal 33 5 - 40 mg/dL   LDL Calculated 99 0 - 99 mg/dL   Chol/HDL Ratio 4.5 (H) 0.0 - 4.4 ratio  Thyroid Panel With TSH  Result Value Ref Range   TSH 0.452 0.450 - 4.500 uIU/mL   T4, Total 10.8 4.5 - 12.0 ug/dL   T3 Uptake Ratio 31 24 - 39 %   Free Thyroxine Index 3.3 1.2 - 4.9      Assessment & Plan:   1. Chronic idiopathic constipation Bowel cleanout instructions are given.  Daughter and she understand the plan, then she will resume the chronic constipation medications she has been taking. Follow with PCP if not improved.   Continue all other maintenance medications as listed above.  Follow up plan: No follow-ups on file.  Educational handout given for BOWEL CLEANOUT instructions  Terald Sleeper PA-C [facility] [address]  [place],  [address] [phone]   06/12/2018, 9:29 PM

## 2018-06-21 ENCOUNTER — Encounter: Payer: Self-pay | Admitting: *Deleted

## 2018-06-21 ENCOUNTER — Encounter: Payer: Self-pay | Admitting: Nurse Practitioner

## 2018-06-21 ENCOUNTER — Ambulatory Visit (INDEPENDENT_AMBULATORY_CARE_PROVIDER_SITE_OTHER): Payer: Medicare Other | Admitting: *Deleted

## 2018-06-21 ENCOUNTER — Ambulatory Visit (INDEPENDENT_AMBULATORY_CARE_PROVIDER_SITE_OTHER): Payer: Medicare Other | Admitting: Nurse Practitioner

## 2018-06-21 VITALS — BP 127/70 | HR 69 | Ht <= 58 in | Wt 123.8 lb

## 2018-06-21 VITALS — BP 124/70 | HR 69 | Ht <= 58 in | Wt 123.8 lb

## 2018-06-21 DIAGNOSIS — K5901 Slow transit constipation: Secondary | ICD-10-CM | POA: Diagnosis not present

## 2018-06-21 DIAGNOSIS — Z0000 Encounter for general adult medical examination without abnormal findings: Secondary | ICD-10-CM

## 2018-06-21 NOTE — Progress Notes (Signed)
Subjective:   Amy Chambers is a 84 y.o. female who presents for Medicare Annual (Subsequent) preventive examination.  Amy Chambers is accompanied today by her daughter.  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  She enjoys reading the Bible, working on word search puzzles, shopping, and going out to eat with her family.  She has 3 children, and 1 deceased child, 3 grandchildren, and 7 great grandchildren.  Amy Chambers lives alone, as her husband passed away 2 years ago.  She lives next door to one of her daughters and son, and they check on her frequently.  She feels her health is unchanged from last year, with the exception of her chronic constipation that still bothers her.  Scheduled her for an appointment today with Bennie Pierini, [name] to discuss this ongoing issue.  She reports no ER visits, hospitalizations, or surgeries in the past year.   Review of Systems:  GI - constipation, bloating, and intermittent abdominal pain Other systems negative       Objective:     Vitals: BP 127/70   Pulse 69   Ht 4\' 9"  (1.448 m)   Wt 123 lb 12.8 oz (56.2 kg)   BMI 26.79 kg/m   Body mass index is 26.79 kg/m.  Advanced Directives 06/21/2018 06/16/2014  Does Patient Have a Medical Advance Directive? Yes No  Type of Estate agent of [place];Living will -  Does patient want to make changes to medical advance directive? No - Patient declined -  Copy of Healthcare Power of Attorney in Chart? No - copy requested -  Would patient like information on creating a medical advance directive? No - Patient declined Yes - Transport planner given    Tobacco Social History   Tobacco Use  Smoking Status Former Smoker  . Last attempt to quit: 08/15/1994  . Years since quitting: 23.8  Smokeless Tobacco Former Neurosurgeon  . Types: Chew  . Quit date: 08/15/1998     Counseling given: Not Answered   Clinical Intake:     Pain : No/denies  pain Pain Score: 0-No pain     Nutritional Status: BMI 25 -29 Overweight Diabetes: No           Past Medical History:  Diagnosis Date  . Cataract   . GERD (gastroesophageal reflux disease)   . Hypertension   . Osteopenia   . Thyroid disease    Past Surgical History:  Procedure Laterality Date  . CATARACT EXTRACTION, BILATERAL     Family History  Problem Relation Age of Onset  . Diabetes Mother   . Cancer Brother   . Tuberculosis Father   . Early death Sister   . Early death Brother   . COPD Brother   . Emphysema Brother   . Early death Sister   . COPD Sister   . Emphysema Sister   . Aneurysm Sister   . Stroke Sister   . Cancer Sister   . Hypertension Son   . Hypertension Daughter   . Fibromyalgia Daughter   . Hypertension Daughter    Social History   Socioeconomic History  . Marital status: Married    Spouse name: Not on file  . Number of children: Not on file  . Years of education: Not on file  . Highest education level: Not on file  Occupational History  . Not on file  Social Needs  . Financial resource strain: Not on file  . Food  insecurity:    Worry: Not on file    Inability: Not on file  . Transportation needs:    Medical: Not on file    Non-medical: Not on file  Tobacco Use  . Smoking status: Former Smoker    Last attempt to quit: 08/15/1994    Years since quitting: 23.8  . Smokeless tobacco: Former Neurosurgeon    Types: Chew    Quit date: 08/15/1998  Substance and Sexual Activity  . Alcohol use: No  . Drug use: No  . Sexual activity: Not on file  Lifestyle  . Physical activity:    Days per week: Not on file    Minutes per session: Not on file  . Stress: Not on file  Relationships  . Social connections:    Talks on phone: Not on file    Gets together: Not on file    Attends religious service: Not on file    Active member of club or organization: Not on file    Attends meetings of clubs or organizations: Not on file    Relationship  status: Not on file  Other Topics Concern  . Not on file  Social History Narrative  . Not on file    Outpatient Encounter Medications as of 06/21/2018  Medication Sig  . albuterol (PROAIR HFA) 108 (90 Base) MCG/ACT inhaler INHALE TWO PUFFS BY MOUTH EVERY 6 HOURS AS NEEDED FOR WHEEZING  . aspirin 81 MG tablet Take 81 mg by mouth daily.  . benazepril (LOTENSIN) 20 MG tablet Take 1 tablet (20 mg total) by mouth daily.  . budesonide-formoterol (SYMBICORT) 160-4.5 MCG/ACT inhaler Inhale 2 puffs into the lungs 2 (two) times daily.  . calcium-vitamin D 250-100 MG-UNIT per tablet Take 1 tablet by mouth 2 (two) times daily.   Marland Kitchen levothyroxine (SYNTHROID, LEVOTHROID) 75 MCG tablet Take 1 tablet (75 mcg total) by mouth daily.  . meclizine (ANTIVERT) 25 MG tablet Take 1 tablet (25 mg total) by mouth 3 (three) times daily as needed for dizziness.  Marland Kitchen omeprazole (PRILOSEC) 20 MG capsule Take 1 capsule (20 mg total) by mouth daily as needed.  . linaclotide (LINZESS) 145 MCG CAPS capsule Take 1 capsule (145 mcg total) by mouth daily before breakfast. (Patient not taking: Reported on 06/21/2018)   No facility-administered encounter medications on file as of 06/21/2018.     Activities of Daily Living In your present state of health, do you have any difficulty performing the following activities: 06/21/2018  Hearing? Y  Comment Decreased hearing both ears with lower tones, not interested in seeing audiologist   Vision? N  Difficulty concentrating or making decisions? N  Walking or climbing stairs? Y  Comment Trouble going upstairs due to shortness of breath  Dressing or bathing? N  Doing errands, shopping? Y  Comment Children provide transportation to appointments and grocery store, patient still has driver's Journalist, newspaper and eating ? N  Using the Toilet? N  In the past six months, have you accidently leaked urine? Y  Comment occassional leakage with coughing or sneezing  Do you have problems  with loss of bowel control? N  Managing your Medications? N  Managing your Finances? N  Housekeeping or managing your Housekeeping? N  Some recent data might be hidden    Patient Care Team: Bennie Pierini, [name] as PCP - General (Nurse Practitioner)    Assessment:   This is a routine wellness examination for [place].  Exercise Activities and Dietary recommendations  Amy Chambers states  she eats 3-4 small meals per day.  Usually has eggs, bacon, toast, fruit for breakfast, sandwich or vegetables for lunch, and meat and vegetables for supper.  She also eats out 2-3 times per week with her family.  She describes a diet of various fruits, vegetables, and proteins.  She states she continues to have problems with chronic constipation so she drinks metamucil, eats fiber one products, and has increased her water intake without resolving her constipation.    Goals    . Exercise 150 minutes per week (moderate activity)    . Exercise 3x per week (30 min per time)     Walking is great option.        Fall Risk Fall Risk  06/21/2018 05/24/2018 03/21/2018 01/05/2018 12/25/2017  Falls in the past year? 0 No No No Yes  Number falls in past yr: - - - - 1  Injury with Fall? - - - - No   Is the patient's home free of loose throw rugs in walkways, pet beds, electrical cords, etc?   yes      Grab bars in the bathroom? no      Handrails on the stairs?   yes      Adequate lighting?   yes    Depression Screen PHQ 2/9 Scores 06/21/2018 05/24/2018 03/21/2018 01/05/2018  PHQ - 2 Score 0 0 0 0     Cognitive Function MMSE - Mini Mental State Exam 06/21/2018 06/08/2017  Orientation to time 5 4  Orientation to Place 5 5  Registration 3 3  Attention/ Calculation 1 0  Attention/Calculation-comments - not attempted  Recall 3 2  Language- name 2 objects 2 2  Language- repeat 1 1  Language- follow 3 step command 3 3  Language- read & follow direction 1 1  Write a sentence 1 1  Copy design 1 0  Total score  26 22        Immunization History  Administered Date(s) Administered  . Influenza Whole 05/13/2010  . Influenza, High Dose Seasonal PF 06/04/2015, 05/19/2016, 06/06/2017, 05/24/2018  . Influenza,inj,Quad PF,6+ Mos 06/07/2013, 06/16/2014  . Pneumococcal Conjugate-13 06/07/2013  . Pneumococcal Polysaccharide-23 04/15/2010  . Td 08/15/2001  . Tdap 01/30/2012    Qualifies for Shingles Vaccine? Yes, patient declined  Screening Tests Health Maintenance  Topic Date Due  . TETANUS/TDAP  01/29/2022  . INFLUENZA VACCINE  Completed  . DEXA SCAN  Completed  . PNA vac Low Risk Adult  Completed    Cancer Screenings: Lung: Low Dose CT Chest recommended if Age 28-80 years, 30 pack-year currently smoking OR have quit w/in 15years. Patient does not qualify. Breast:  Up to date on Mammogram? Not indicated Up to date of Bone Density/Dexa? Patient declined Colorectal: not indicated  Additional Screenings:  Hepatitis C Screening:  Not indicated      Plan:     Work on your goal of increasing your exercise to 3 times per week for 30 minutes.  Walking is a great option.   Bring a copy of you Healthcare Power of Attorney and Living Will to our office to be filed in your medical record. Follow up with Bennie Pierini, [name] as scheduled.   I have personally reviewed and noted the following in the patient's chart:   . Medical and social history . Use of alcohol, tobacco or illicit drugs  . Current medications and supplements . Functional ability and status . Nutritional status . Physical activity . Advanced directives . List of  other physicians . Hospitalizations, surgeries, and ER visits in previous 12 months . Vitals . Screenings to include cognitive, depression, and falls . Referrals and appointments  In addition, I have reviewed and discussed with patient certain preventive protocols, quality metrics, and best practice recommendations. A written personalized care plan for  preventive services as well as general preventive health recommendations were provided to patient.     Sokha Craker M, RN  06/21/2018  I have reviewed and agree with the above AWV documentation.   [name] [name], [name]

## 2018-06-21 NOTE — Progress Notes (Signed)
   Subjective:    Patient ID: Amy Chambers, female    DOB: 1928/05/15, 84 y.o.   MRN: [id number]   Chief Complaint: Constipation   HPI Patient is brought in by her daughter Amy Chambers still c/o constipation. She only boops a little tiny bit daily. Having slight abdominal pain with belching that has developed the last couple of weeks.she is currently doing miralax 1x a day. We tried linzess and that helped some.    Review of Systems  Constitutional: Negative.   Respiratory: Negative.   Cardiovascular: Negative.   Gastrointestinal: Positive for abdominal pain and constipation.  Neurological: Negative.   Psychiatric/Behavioral: Negative.   All other systems reviewed and are negative.      Objective:   Physical Exam  Constitutional: She is oriented to person, place, and time. She appears well-developed and well-nourished. No distress.  Cardiovascular: Normal rate and regular rhythm.  Pulmonary/Chest: Effort normal.  Abdominal: Soft.  Neurological: She is alert and oriented to person, place, and time.  Skin: Skin is warm and dry.  Psychiatric: She has a normal mood and affect. Her behavior is normal. Thought content normal.    BP 124/70   Pulse 69   Ht 4\' 9"  (1.448 m)   Wt 123 lb 12.8 oz (56.2 kg)   BMI 26.79 kg/m         Assessment & Plan:  Amy Chambers in today with chief complaint of Constipation   1. Slow transit constipation Milk of magnesa dose and 6ox of prune juice- wait 6 hours if no results repeat. Can do mineral oil enema if needed Continue linzess samples Increase fiber in diet RTO prn  Amy [name], [name]

## 2018-06-21 NOTE — Patient Instructions (Signed)
Please work on your goal of increasing your exercise to 3 times per week for 30 minutes.  Walking is a great option.    At your convenience, please bring a copy of you Healthcare Power of Attorney and Living Will to our office to be filed in your medical record.  Thank you for coming in for your Annual Wellenss Visit today!!  Preventive Care 82 Years and Older, Female Preventive care refers to lifestyle choices and visits with your health care provider that can promote health and wellness. What does preventive care include?  A yearly physical exam. This is also called an annual well check.  Dental exams once or twice a year.  Routine eye exams. Ask your health care provider how often you should have your eyes checked.  Personal lifestyle choices, including: ? Daily care of your teeth and gums. ? Regular physical activity. ? Eating a healthy diet. ? Avoiding tobacco and drug use. ? Limiting alcohol use. ? Practicing safe sex. ? Taking low-dose aspirin every day. ? Taking vitamin and mineral supplements as recommended by your health care provider. What happens during an annual well check? The services and screenings done by your health care provider during your annual well check will depend on your age, overall health, lifestyle risk factors, and family history of disease. Counseling Your health care provider may ask you questions about your:  Alcohol use.  Tobacco use.  Drug use.  Emotional well-being.  Home and relationship well-being.  Sexual activity.  Eating habits.  History of falls.  Memory and ability to understand (cognition).  Work and work Statistician.  Reproductive health.  Screening You may have the following tests or measurements:  Height, weight, and BMI.  Blood pressure.  Lipid and cholesterol levels. These may be checked every 5 years, or more frequently if you are over 63 years old.  Skin check.  Lung cancer screening. You may have this  screening every year starting at age 46 if you have a 30-pack-year history of smoking and currently smoke or have quit within the past 15 years.  Fecal occult blood test (FOBT) of the stool. You may have this test every year starting at age 69.  Flexible sigmoidoscopy or colonoscopy. You may have a sigmoidoscopy every 5 years or a colonoscopy every 10 years starting at age 67.  Hepatitis C blood test.  Hepatitis B blood test.  Sexually transmitted disease (STD) testing.  Diabetes screening. This is done by checking your blood sugar (glucose) after you have not eaten for a while (fasting). You may have this done every 1-3 years.  Bone density scan. This is done to screen for osteoporosis. You may have this done starting at age 70.  Mammogram. This may be done every 1-2 years. Talk to your health care provider about how often you should have regular mammograms.  Talk with your health care provider about your test results, treatment options, and if necessary, the need for more tests. Vaccines Your health care provider may recommend certain vaccines, such as:  Influenza vaccine. This is recommended every year.  Tetanus, diphtheria, and acellular pertussis (Tdap, Td) vaccine. You may need a Td booster every 10 years.  Varicella vaccine. You may need this if you have not been vaccinated.  Zoster vaccine. You may need this after age 37.  Measles, mumps, and rubella (MMR) vaccine. You may need at least one dose of MMR if you were born in 1957 or later. You may also need a second dose.  Pneumococcal 13-valent conjugate (PCV13) vaccine. One dose is recommended after age 69.  Pneumococcal polysaccharide (PPSV23) vaccine. One dose is recommended after age 23.  Meningococcal vaccine. You may need this if you have certain conditions.  Hepatitis A vaccine. You may need this if you have certain conditions or if you travel or work in places where you may be exposed to hepatitis A.  Hepatitis B  vaccine. You may need this if you have certain conditions or if you travel or work in places where you may be exposed to hepatitis B.  Haemophilus influenzae type b (Hib) vaccine. You may need this if you have certain conditions.  Talk to your health care provider about which screenings and vaccines you need and how often you need them. This information is not intended to replace advice given to you by your health care provider. Make sure you discuss any questions you have with your health care provider. Document Released: 08/28/2015 Document Revised: 04/20/2016 Document Reviewed: 06/02/2015 Elsevier Interactive Patient Education  [employer].

## 2018-06-25 ENCOUNTER — Telehealth: Payer: Self-pay | Admitting: Nurse Practitioner

## 2018-06-25 DIAGNOSIS — K5900 Constipation, unspecified: Secondary | ICD-10-CM

## 2018-06-25 NOTE — Telephone Encounter (Signed)
Has done enema on Friday. Advised may try that again, push fluids and walking.  Daughter thinks a referral is necessary and hopes it is done quickly cause mom really complaining of feeling bad and sick.

## 2018-06-25 NOTE — Telephone Encounter (Signed)
Did they do enema? If yes then will need GI referral

## 2018-06-25 NOTE — Telephone Encounter (Signed)
Amy Chambers has called for her mother states that what was done for bowel movement did not work wants to know what next step is?

## 2018-06-25 NOTE — Telephone Encounter (Signed)
Please advise on patients question.

## 2018-07-02 ENCOUNTER — Ambulatory Visit (HOSPITAL_COMMUNITY)
Admission: RE | Admit: 2018-07-02 | Discharge: 2018-07-02 | Disposition: A | Discharge status: Home / Self Care | Payer: Medicare Other | Source: Ambulatory Visit | Attending: Internal Medicine | Admitting: Internal Medicine

## 2018-07-02 ENCOUNTER — Ambulatory Visit (INDEPENDENT_AMBULATORY_CARE_PROVIDER_SITE_OTHER): Payer: Medicare Other | Admitting: Internal Medicine

## 2018-07-02 ENCOUNTER — Telehealth (INDEPENDENT_AMBULATORY_CARE_PROVIDER_SITE_OTHER): Payer: Self-pay | Admitting: Internal Medicine

## 2018-07-02 ENCOUNTER — Encounter (INDEPENDENT_AMBULATORY_CARE_PROVIDER_SITE_OTHER): Payer: Self-pay | Admitting: Internal Medicine

## 2018-07-02 VITALS — BP 130/80 | HR 72 | Temp 98.1°F | Ht 59.0 in | Wt 122.1 lb

## 2018-07-02 DIAGNOSIS — K5909 Other constipation: Secondary | ICD-10-CM

## 2018-07-02 DIAGNOSIS — K5900 Constipation, unspecified: Secondary | ICD-10-CM | POA: Diagnosis not present

## 2018-07-02 NOTE — Telephone Encounter (Signed)
err

## 2018-07-02 NOTE — Patient Instructions (Addendum)
DG abdomen

## 2018-07-02 NOTE — Progress Notes (Signed)
   Subjective:    Patient ID: Amy GamblesNancy M Joynt, female    DOB: 1928/06/04, 84 y.o.   MRN: [id number]  HPI Referred by Gennette PacMary Margaret [name] for constipation. Has been constipated since May. Underwent a abdominal film in May which revealed changes consistent with constipation. Has tried Linzess, Clearlax, Saline enema, Prune juice and apple juice. States when she tried these medication she would pass water.  Has a small BM every day. Her appetite is okay.  She says she has abdominal pain from the constipation.  Has never undergone a colonoscopy in the past. Does not desire to have a colonoscopy.    Review of Systems Past Medical History:  Diagnosis Date  . Cataract   . GERD (gastroesophageal reflux disease)   . Hypertension   . Osteopenia   . Thyroid disease     Past Surgical History:  Procedure Laterality Date  . CATARACT EXTRACTION, BILATERAL      Allergies  Allergen Reactions  . Crestor [Rosuvastatin Calcium]   . Niaspan [Niacin Er]     Current Outpatient Medications on File Prior to Visit  Medication Sig Dispense Refill  . albuterol (PROAIR HFA) 108 (90 Base) MCG/ACT inhaler INHALE TWO PUFFS BY MOUTH EVERY 6 HOURS AS NEEDED FOR WHEEZING 1 each 0  . aspirin 81 MG tablet Take 81 mg by mouth daily.    . benazepril (LOTENSIN) 20 MG tablet Take 1 tablet (20 mg total) by mouth daily. 90 tablet 1  . budesonide-formoterol (SYMBICORT) 160-4.5 MCG/ACT inhaler Inhale 2 puffs into the lungs 2 (two) times daily. 1 Inhaler 3  . calcium-vitamin D 250-100 MG-UNIT per tablet Take 1 tablet by mouth 2 (two) times daily.     Marland Kitchen. levothyroxine (SYNTHROID, LEVOTHROID) 75 MCG tablet Take 1 tablet (75 mcg total) by mouth daily. 90 tablet 1  . meclizine (ANTIVERT) 25 MG tablet Take 1 tablet (25 mg total) by mouth 3 (three) times daily as needed for dizziness. 30 tablet 0   No current facility-administered medications on file prior to visit.         Objective:   Physical Exam Blood pressure 130/80,  pulse 72, temperature 98.1 F (36.7 C), height 4\' 11"  (1.499 m), weight 122 lb 1.6 oz (55.4 kg). Alert and oriented. Skin warm and dry. Oral mucosa is moist.   . Sclera anicteric, conjunctivae is pink. Thyroid not enlarged. No cervical lymphadenopathy. Lungs clear. Heart regular rate and rhythm.  Abdomen is soft. Bowel sounds are positive. No hepatomegaly. No abdominal masses felt. No tenderness.  No edema to lower extremities.           Assessment & Plan:  Constipation: Am going to get a DG abdomen. She will continue to use Miralax daily.

## 2018-07-03 ENCOUNTER — Telehealth (INDEPENDENT_AMBULATORY_CARE_PROVIDER_SITE_OTHER): Payer: Self-pay | Admitting: Internal Medicine

## 2018-07-04 NOTE — Telephone Encounter (Signed)
err

## 2018-08-30 ENCOUNTER — Encounter: Payer: Self-pay | Admitting: Nurse Practitioner

## 2018-08-30 ENCOUNTER — Ambulatory Visit (INDEPENDENT_AMBULATORY_CARE_PROVIDER_SITE_OTHER): Payer: Medicare Other | Admitting: Nurse Practitioner

## 2018-08-30 VITALS — BP 132/65 | HR 66 | Temp 97.0°F | Ht 59.0 in | Wt 121.0 lb

## 2018-08-30 DIAGNOSIS — I10 Essential (primary) hypertension: Secondary | ICD-10-CM

## 2018-08-30 DIAGNOSIS — K219 Gastro-esophageal reflux disease without esophagitis: Secondary | ICD-10-CM

## 2018-08-30 DIAGNOSIS — M8580 Other specified disorders of bone density and structure, unspecified site: Secondary | ICD-10-CM

## 2018-08-30 DIAGNOSIS — E782 Mixed hyperlipidemia: Secondary | ICD-10-CM | POA: Diagnosis not present

## 2018-08-30 DIAGNOSIS — J410 Simple chronic bronchitis: Secondary | ICD-10-CM

## 2018-08-30 DIAGNOSIS — E039 Hypothyroidism, unspecified: Secondary | ICD-10-CM

## 2018-08-30 MED ORDER — BENAZEPRIL HCL 20 MG PO TABS: 20.0000 mg | ORAL_TABLET | Freq: Every day | ORAL | 1 refills | Status: DC

## 2018-08-30 MED ORDER — BUDESONIDE-FORMOTEROL FUMARATE 160-4.5 MCG/ACT IN AERO: 2.0000 | INHALATION_SPRAY | Freq: Two times a day (BID) | RESPIRATORY_TRACT | 3 refills | Status: DC

## 2018-08-30 MED ORDER — LEVOTHYROXINE SODIUM 75 MCG PO TABS: 75.0000 ug | ORAL_TABLET | Freq: Every day | ORAL | 1 refills | Status: DC

## 2018-08-30 NOTE — Progress Notes (Signed)
Subjective:    Patient ID: Amy Chambers, female    DOB: [birth year], 85 y.o.   MRN: [id number]   Chief Complaint: medical management of chronic issues  HPI:  1. Essential hypertension  No c/o chest pian, sob or headache. Does not check blood pressure at home BP Readings from Last 3 Encounters:  07/02/18 130/80  06/21/18 124/70  06/21/18 127/70     2. Gastroesophageal reflux disease without esophagitis  Is currently on no rx drugs for this . Has occasional symptoms  3. Hypothyroidism, unspecified type  No problems that she is aware of.  4. Mixed hyperlipidemia  Has a very poor appetite so family just lets her eat whatever she will eat.  5. Osteopenia, unspecified location  Last dexascan was done in 2015 with t score of -2.4- she is no longer doing these because  Of her age. She does no weight bearing exercise. She doe take a daily vitamin d and calcium supplement.  6. Simple chronic bronchitis ([place])  She is on symbicort daily- has not needed albuterol in the last couple of months    Outpatient Encounter Medications as of 08/30/2018  Medication Sig  . albuterol (PROAIR HFA) 108 (90 Base) MCG/ACT inhaler INHALE TWO PUFFS BY MOUTH EVERY 6 HOURS AS NEEDED FOR WHEEZING  . aspirin 81 MG tablet Take 81 mg by mouth daily.  . benazepril (LOTENSIN) 20 MG tablet Take 1 tablet (20 mg total) by mouth daily.  . budesonide-formoterol (SYMBICORT) 160-4.5 MCG/ACT inhaler Inhale 2 puffs into the lungs 2 (two) times daily.  . calcium-vitamin D 250-100 MG-UNIT per tablet Take 1 tablet by mouth 2 (two) times daily.   Marland Kitchen levothyroxine (SYNTHROID, LEVOTHROID) 75 MCG tablet Take 1 tablet (75 mcg total) by mouth daily.  . meclizine (ANTIVERT) 25 MG tablet Take 1 tablet (25 mg total) by mouth 3 (three) times daily as needed for dizziness.  . polyethylene glycol (MIRALAX / GLYCOLAX) packet Take 17 g by mouth daily.      New complaints: None today  Social history: Lives by herself and daughter  checks on her daily   Review of Systems  Constitutional: Negative for activity change and appetite change.  HENT: Negative.   Eyes: Negative for pain.  Respiratory: Negative for shortness of breath.   Cardiovascular: Negative for chest pain, palpitations and leg swelling.  Gastrointestinal: Negative for abdominal pain.  Endocrine: Negative for polydipsia.  Genitourinary: Negative.   Skin: Negative for rash.  Neurological: Negative for dizziness, weakness and headaches.  Hematological: Does not bruise/bleed easily.  Psychiatric/Behavioral: Negative.   All other systems reviewed and are negative.      Objective:   Physical Exam Vitals signs and nursing note reviewed.  Constitutional:      General: She is not in acute distress.    Appearance: Normal appearance. She is well-developed.  HENT:     Head: Normocephalic.     Nose: Nose normal.  Eyes:     Pupils: Pupils are equal, round, and reactive to light.  Neck:     Musculoskeletal: Normal range of motion and neck supple.     Vascular: No carotid bruit or JVD.  Cardiovascular:     Rate and Rhythm: Normal rate and regular rhythm.     Heart sounds: Normal heart sounds.  Pulmonary:     Effort: Pulmonary effort is normal. No respiratory distress.     Breath sounds: Normal breath sounds. No wheezing or rales.  Chest:     Chest wall:  No tenderness.  Abdominal:     General: Bowel sounds are normal. There is no distension or abdominal bruit.     Palpations: Abdomen is soft. There is no hepatomegaly, splenomegaly, mass or pulsatile mass.     Tenderness: There is no abdominal tenderness.  Musculoskeletal: Normal range of motion.  Lymphadenopathy:     Cervical: No cervical adenopathy.  Skin:    General: Skin is warm and dry.  Neurological:     Mental Status: She is alert and oriented to person, place, and time.     Deep Tendon Reflexes: Reflexes are normal and symmetric.  Psychiatric:        Behavior: Behavior normal.         Thought Content: Thought content normal.        Judgment: Judgment normal.    BP 132/65   Pulse 66   Temp (!) 97 F (36.1 C) (Oral)   Ht _0  (1.499 m)   Wt 121 lb (54.9 kg)   BMI 24.44 kg/m         Assessment & Plan:  Amy Chambers comes in today with chief complaint of No chief complaint on file.   Diagnosis and orders addressed:  1. Essential hypertension Low sodium diet - CMP14+EGFR - benazepril (LOTENSIN) 20 MG tablet; Take 1 tablet (20 mg total) by mouth daily.  Dispense: 90 tablet; Refill: 1  2. Gastroesophageal reflux disease without esophagitis Avoid spicy foods Do not eat 2 hours prior to bedtime  3. Acquired hypothyroidism - levothyroxine (SYNTHROID, LEVOTHROID) 75 MCG tablet; Take 1 tablet (75 mcg total) by mouth daily.  Dispense: 90 tablet; Refill: 1 - Thyroid Panel With TSH  4. Mixed hyperlipidemia Low fat diet - Lipid panel  5. Osteopenia, unspecified location Weight bearing exercises  6. Simple chronic bronchitis (HCC) - budesonide-formoterol (SYMBICORT) 160-4.5 MCG/ACT inhaler; Inhale 2 puffs into the lungs 2 (two) times daily.  Dispense: 1 Inhaler; Refill: 3   Labs pending Health Maintenance reviewed Diet and exercise encouraged  Follow up plan:  6 months   [name] Hassell Done, [name]

## 2018-08-30 NOTE — Patient Instructions (Signed)
Fall Prevention in the Home, Adult  Falls can cause injuries. They can happen to people of all ages. There are many things you can do to make your home safe and to help prevent falls. Ask for help when making these changes, if needed.  What actions can I take to prevent falls?  General Instructions  · Use good lighting in all rooms. Replace any light bulbs that burn out.  · Turn on the lights when you go into a dark area. Use night-lights.  · Keep items that you use often in easy-to-reach places. Lower the shelves around your home if necessary.  · Set up your furniture so you have a clear path. Avoid moving your furniture around.  · Do not have throw rugs and other things on the floor that can make you trip.  · Avoid walking on wet floors.  · If any of your floors are uneven, fix them.  · Add color or contrast paint or tape to clearly mark and help you see:  ? Any grab bars or handrails.  ? First and last steps of stairways.  ? Where the edge of each step is.  · If you use a stepladder:  ? Make sure that it is fully opened. Do not climb a closed stepladder.  ? Make sure that both sides of the stepladder are locked into place.  ? Ask someone to hold the stepladder for you while you use it.  · If there are any pets around you, be aware of where they are.  What can I do in the bathroom?         · Keep the floor dry. Clean up any water that spills onto the floor as soon as it happens.  · Remove soap buildup in the tub or shower regularly.  · Use non-skid mats or decals on the floor of the tub or shower.  · Attach bath mats securely with double-sided, non-slip rug tape.  · If you need to sit down in the shower, use a plastic, non-slip stool.  · Install grab bars by the toilet and in the tub and shower. Do not use towel bars as grab bars.  What can I do in the bedroom?  · Make sure that you have a light by your bed that is easy to reach.  · Do not use any sheets or blankets that are too big for your bed. They should  not hang down onto the floor.  · Have a firm chair that has side arms. You can use this for support while you get dressed.  What can I do in the kitchen?  · Clean up any spills right away.  · If you need to reach something above you, use a strong step stool that has a grab bar.  · Keep electrical cords out of the way.  · Do not use floor polish or wax that makes floors slippery. If you must use wax, use non-skid floor wax.  What can I do with my stairs?  · Do not leave any items on the stairs.  · Make sure that you have a light switch at the top of the stairs and the bottom of the stairs. If you do not have them, ask someone to add them for you.  · Make sure that there are handrails on both sides of the stairs, and use them. Fix handrails that are broken or loose. Make sure that handrails are as long as the stairways.  ·   Install non-slip stair treads on all stairs in your home.  · Avoid having throw rugs at the top or bottom of the stairs. If you do have throw rugs, attach them to the floor with carpet tape.  · Choose a carpet that does not hide the edge of the steps on the stairway.  · Check any carpeting to make sure that it is firmly attached to the stairs. Fix any carpet that is loose or worn.  What can I do on the outside of my home?  · Use bright outdoor lighting.  · Regularly fix the edges of walkways and driveways and fix any cracks.  · Remove anything that might make you trip as you walk through a door, such as a raised step or threshold.  · Trim any bushes or trees on the path to your home.  · Regularly check to see if handrails are loose or broken. Make sure that both sides of any steps have handrails.  · Install guardrails along the edges of any raised decks and porches.  · Clear walking paths of anything that might make someone trip, such as tools or rocks.  · Have any leaves, snow, or ice cleared regularly.  · Use sand or salt on walking paths during winter.  · Clean up any spills in your garage right  away. This includes grease or oil spills.  What other actions can I take?  · Wear shoes that:  ? Have a low heel. Do not wear high heels.  ? Have rubber bottoms.  ? Are comfortable and fit you well.  ? Are closed at the toe. Do not wear open-toe sandals.  · Use tools that help you move around (mobility aids) if they are needed. These include:  ? Canes.  ? Walkers.  ? Scooters.  ? Crutches.  · Review your medicines with your doctor. Some medicines can make you feel dizzy. This can increase your chance of falling.  Ask your doctor what other things you can do to help prevent falls.  Where to find more information  · Centers for Disease Control and Prevention, [employer]: https://cdc.gov  · National Institute on Aging: https://go4life.nia.nih.gov  Contact a doctor if:  · You are afraid of falling at home.  · You feel weak, drowsy, or dizzy at home.  · You fall at home.  Summary  · There are many simple things that you can do to make your home safe and to help prevent falls.  · Ways to make your home safe include removing tripping hazards and installing grab bars in the bathroom.  · Ask for help when making these changes in your home.  This information is not intended to replace advice given to you by your health care provider. Make sure you discuss any questions you have with your health care provider.  Document Released: 05/28/2009 Document Revised: 03/16/2017 Document Reviewed: 03/16/2017  Elsevier Interactive Patient Education © 2019 Elsevier Inc.

## 2018-08-31 LAB — CMP14+EGFR
ALK PHOS: 76 IU/L (ref 39–117)
ALT: 19 IU/L (ref 0–32)
AST: 24 IU/L (ref 0–40)
Albumin/Globulin Ratio: 1.7 (ref 1.2–2.2)
Albumin: 4.3 g/dL (ref 3.5–4.7)
BUN / CREAT RATIO: 29 — AB (ref 12–28)
BUN: 22 mg/dL (ref 8–27)
Bilirubin Total: 0.3 mg/dL (ref 0.0–1.2)
CALCIUM: 9.6 mg/dL (ref 8.7–10.3)
CHLORIDE: 98 mmol/L (ref 96–106)
CO2: 23 mmol/L (ref 20–29)
CREATININE: 0.76 mg/dL (ref 0.57–1.00)
GFR calc Af Amer: 81 mL/min/{1.73_m2} (ref >59)
GFR, EST NON AFRICAN AMERICAN: 70 mL/min/{1.73_m2} (ref >59)
GLOBULIN, TOTAL: 2.5 g/dL (ref 1.5–4.5)
GLUCOSE: 82 mg/dL (ref 65–99)
POTASSIUM: 4.7 mmol/L (ref 3.5–5.2)
Sodium: 138 mmol/L (ref 134–144)
Total Protein: 6.8 g/dL (ref 6.0–8.5)

## 2018-08-31 LAB — LIPID PANEL
Chol/HDL Ratio: 4.4 ratio (ref 0.0–4.4)
Cholesterol, Total: 204 mg/dL — ABNORMAL HIGH (ref 100–199)
HDL: 46 mg/dL (ref >39)
LDL Calculated: 130 mg/dL — ABNORMAL HIGH (ref 0–99)
Triglycerides: 140 mg/dL (ref 0–149)
VLDL CHOLESTEROL CAL: 28 mg/dL (ref 5–40)

## 2018-08-31 LAB — THYROID PANEL WITH TSH
Free Thyroxine Index: 2.6 (ref 1.2–4.9)
T3 Uptake Ratio: 28 % (ref 24–39)
T4, Total: 9.3 ug/dL (ref 4.5–12.0)
TSH: 1.72 u[IU]/mL (ref 0.450–4.500)

## 2018-10-26 ENCOUNTER — Other Ambulatory Visit: Payer: Self-pay

## 2018-10-26 ENCOUNTER — Encounter: Payer: Self-pay | Admitting: Family Medicine

## 2018-10-26 ENCOUNTER — Ambulatory Visit (INDEPENDENT_AMBULATORY_CARE_PROVIDER_SITE_OTHER): Payer: Medicare Other | Admitting: Family Medicine

## 2018-10-26 VITALS — BP 160/76 | HR 91 | Temp 100.1°F | Ht 59.0 in | Wt 123.0 lb

## 2018-10-26 DIAGNOSIS — J441 Chronic obstructive pulmonary disease with (acute) exacerbation: Secondary | ICD-10-CM

## 2018-10-26 DIAGNOSIS — R05 Cough: Secondary | ICD-10-CM

## 2018-10-26 DIAGNOSIS — R059 Cough, unspecified: Secondary | ICD-10-CM

## 2018-10-26 LAB — VERITOR FLU A/B WAIVED
INFLUENZA A: NEGATIVE
Influenza B: NEGATIVE

## 2018-10-26 MED ORDER — PREDNISONE 20 MG PO TABS: ORAL_TABLET | ORAL | 0 refills | Status: DC

## 2018-10-26 MED ORDER — LEVOFLOXACIN 500 MG PO TABS: 500.0000 mg | ORAL_TABLET | Freq: Every day | ORAL | 0 refills | Status: AC

## 2018-10-26 NOTE — Progress Notes (Signed)
Subjective:  Patient ID: Amy Chambers, female    DOB: [birth year], 85 y.o.   MRN: [id number]  Chief Complaint:  URI (cough, sneezing, runny nose, frontal facial pain/pressure x 1 day. Has taken somthing OTC but isn't sure what it is. )   HPI: Amy Chambers is a 85 y.o. female presenting on 10/26/2018 for URI (cough, sneezing, runny nose, frontal facial pain/pressure x 1 day. Has taken somthing OTC but isn't sure what it is. )  Pt presents today with complaints of increased cough, shortness of breath, and sputum production. Pt has been feeling bad for a few days and then developed a fever yesterday with URI symptoms. Pt states she has not tired anything for the symptoms. She is not O2 dependent. Denies recent COPD exacerbation or hospitalization. She has not had to use her Albuterol inhaler but is using her controller medications as prescribed. No weakness, confusion, or decreased urination. She has had malaise.   Relevant past medical, surgical, family, and social history reviewed and updated as indicated.  Allergies and medications reviewed and updated.   Past Medical History:  Diagnosis Date  . Cataract   . GERD (gastroesophageal reflux disease)   . Hypertension   . Osteopenia   . Thyroid disease     Past Surgical History:  Procedure Laterality Date  . CATARACT EXTRACTION, BILATERAL      Social History   Socioeconomic History  . Marital status: Married    Spouse name: Not on file  . Number of children: Not on file  . Years of education: Not on file  . Highest education level: Not on file  Occupational History  . Not on file  Social Needs  . Financial resource strain: Not on file  . Food insecurity:    Worry: Not on file    Inability: Not on file  . Transportation needs:    Medical: Not on file    Non-medical: Not on file  Tobacco Use  . Smoking status: Former Smoker    Last attempt to quit: 08/15/1994    Years since quitting: 24.2  . Smokeless tobacco: Former Systems developer     Types: [place] date: 08/15/1998  Substance and Sexual Activity  . Alcohol use: No  . Drug use: No  . Sexual activity: Not on file  Lifestyle  . Physical activity:    Days per week: Not on file    Minutes per session: Not on file  . Stress: Not on file  Relationships  . Social connections:    Talks on phone: Not on file    Gets together: Not on file    Attends religious service: Not on file    Active member of club or organization: Not on file    Attends meetings of clubs or organizations: Not on file    Relationship status: Not on file  . Intimate partner violence:    Fear of current or ex partner: Not on file    Emotionally abused: Not on file    Physically abused: Not on file    Forced sexual activity: Not on file  Other Topics Concern  . Not on file  Social History Narrative  . Not on file    Outpatient Encounter Medications as of 10/26/2018  Medication Sig  . albuterol (PROAIR HFA) 108 (90 Base) MCG/ACT inhaler INHALE TWO PUFFS BY MOUTH EVERY 6 HOURS AS NEEDED FOR WHEEZING  . aspirin 81 MG tablet Take 81 mg by  mouth daily.  . benazepril (LOTENSIN) 20 MG tablet Take 1 tablet (20 mg total) by mouth daily.  . budesonide-formoterol (SYMBICORT) 160-4.5 MCG/ACT inhaler Inhale 2 puffs into the lungs 2 (two) times daily.  . calcium-vitamin D 250-100 MG-UNIT per tablet Take 1 tablet by mouth 2 (two) times daily.   Marland Kitchen levofloxacin (LEVAQUIN) 500 MG tablet Take 1 tablet (500 mg total) by mouth daily for 7 days.  Marland Kitchen levothyroxine (SYNTHROID, LEVOTHROID) 75 MCG tablet Take 1 tablet (75 mcg total) by mouth daily.  . meclizine (ANTIVERT) 25 MG tablet Take 1 tablet (25 mg total) by mouth 3 (three) times daily as needed for dizziness.  . polyethylene glycol (MIRALAX / GLYCOLAX) packet Take 17 g by mouth daily.  . predniSONE (DELTASONE) 20 MG tablet 2 po at sametime daily for 5 days   No facility-administered encounter medications on file as of 10/26/2018.     Allergies  Allergen  Reactions  . Crestor [Rosuvastatin Calcium]   . Niaspan [Niacin Er]     Review of Systems      Objective:  BP (!) 160/76 (BP Location: Right Arm, Patient Position: Sitting, Cuff Size: Normal)   Pulse 91   Temp 100.1 F (37.8 C) (Oral)   Ht 4' 11"  (1.499 m)   Wt 123 lb (55.8 kg)   SpO2 93%   BMI 24.84 kg/m    Wt Readings from Last 3 Encounters:  10/26/18 123 lb (55.8 kg)  08/30/18 121 lb (54.9 kg)  07/02/18 122 lb 1.6 oz (55.4 kg)    Physical Exam Vitals signs and nursing note reviewed.  Constitutional:      General: She is not in acute distress.    Appearance: Normal appearance. She is well-developed and well-groomed. She is not ill-appearing, toxic-appearing or diaphoretic.  HENT:     Head: Normocephalic and atraumatic.     Right Ear: Hearing, tympanic membrane, ear canal and external ear normal.     Left Ear: Hearing, tympanic membrane, ear canal and external ear normal.     Nose: No congestion or rhinorrhea.     Right Turbinates: Not enlarged, swollen or pale.     Left Turbinates: Not enlarged, swollen or pale.     Right Sinus: No maxillary sinus tenderness or frontal sinus tenderness.     Left Sinus: No maxillary sinus tenderness or frontal sinus tenderness.     Mouth/Throat:     Lips: Pink.     Mouth: Mucous membranes are moist.     Pharynx: Oropharynx is clear. Uvula midline.     Tonsils: No tonsillar exudate or tonsillar abscesses.  Eyes:     General: Lids are normal.     Extraocular Movements: Extraocular movements intact.     Conjunctiva/sclera: Conjunctivae normal.     Pupils: Pupils are equal, round, and reactive to light.  Neck:     Musculoskeletal: Neck supple.     Trachea: Trachea and phonation normal.  Cardiovascular:     Rate and Rhythm: Normal rate and regular rhythm.     Heart sounds: Normal heart sounds. No murmur. No friction rub. No gallop.   Pulmonary:     Effort: No tachypnea or respiratory distress.     Breath sounds: Examination of  the right-lower field reveals rhonchi. Examination of the left-lower field reveals rhonchi. Wheezing (scattered, mild) and rhonchi present.  Musculoskeletal:     Right lower leg: No edema.     Left lower leg: No edema.  Lymphadenopathy:     Cervical:  No cervical adenopathy.  Skin:    General: Skin is warm and dry.     Capillary Refill: Capillary refill takes less than 2 seconds.     Coloration: Skin is not cyanotic or pale.  Neurological:     General: No focal deficit present.     Mental Status: She is alert and oriented to person, place, and time.  Psychiatric:        Mood and Affect: Mood normal.        Behavior: Behavior normal. Behavior is cooperative.        Thought Content: Thought content normal.        Judgment: Judgment normal.     Results for orders placed or performed in visit on 08/30/18  CMP14+EGFR  Result Value Ref Range   Glucose 82 65 - 99 mg/dL   BUN 22 8 - 27 mg/dL   Creatinine, Ser 0.76 0.57 - 1.00 mg/dL   GFR calc non Af Amer 70 >59 mL/min/1.73   GFR calc Af Amer 81 >59 mL/min/1.73   BUN/Creatinine Ratio 29 (H) 12 - 28   Sodium 138 134 - 144 mmol/L   Potassium 4.7 3.5 - 5.2 mmol/L   Chloride 98 96 - 106 mmol/L   CO2 23 20 - 29 mmol/L   Calcium 9.6 8.7 - 10.3 mg/dL   Total Protein 6.8 6.0 - 8.5 g/dL   Albumin 4.3 3.5 - 4.7 g/dL   Globulin, Total 2.5 1.5 - 4.5 g/dL   Albumin/Globulin Ratio 1.7 1.2 - 2.2   Bilirubin Total 0.3 0.0 - 1.2 mg/dL   Alkaline Phosphatase 76 39 - 117 IU/L   AST 24 0 - 40 IU/L   ALT 19 0 - 32 IU/L  Lipid panel  Result Value Ref Range   Cholesterol, Total 204 (H) 100 - 199 mg/dL   Triglycerides 140 0 - 149 mg/dL   HDL 46 >39 mg/dL   VLDL Cholesterol Cal 28 5 - 40 mg/dL   LDL Calculated 130 (H) 0 - 99 mg/dL   Chol/HDL Ratio 4.4 0.0 - 4.4 ratio  Thyroid Panel With TSH  Result Value Ref Range   TSH 1.720 0.450 - 4.500 uIU/mL   T4, Total 9.3 4.5 - 12.0 ug/dL   T3 Uptake Ratio 28 24 - 39 %   Free Thyroxine Index 2.6 1.2 - 4.9      Influenza negative in office.   Pertinent labs & imaging results that were available during my care of the patient were reviewed by me and considered in my medical decision making.  Assessment & Plan:  Jamarie was seen today for uri.  Diagnoses and all orders for this visit:  COPD with acute exacerbation (HCC) Increased cough, shortness of breath, and sputum production with fever. May be early pneumonia, xray not available tonight in clinic. Will treat COPD exacerbation with Levaquin which will cover CAP. Pt aware of signs and symptoms that warrant emergent evaluation. Return in one week for reevaluation.  -     levofloxacin (LEVAQUIN) 500 MG tablet; Take 1 tablet (500 mg total) by mouth daily for 7 days. -     predniSONE (DELTASONE) 20 MG tablet; 2 po at sametime daily for 5 days  Cough Influenza negative.  -     Veritor Flu A/B Waived     Continue all other maintenance medications.  Follow up plan: Return in about 1 week (around 11/02/2018), or if symptoms worsen or fail to improve, for COPD exacerbation.  Educational handout given  for   The above assessment and management plan was discussed with the patient. The patient verbalized understanding of and has agreed to the management plan. Patient is aware to call the clinic if symptoms persist or worsen. Patient is aware when to return to the clinic for a follow-up visit. Patient educated on when it is appropriate to go to the emergency department.   Monia Pouch, [name]-C [facility] [phone]

## 2018-10-26 NOTE — Patient Instructions (Signed)
Chronic Obstructive Pulmonary Disease Chronic obstructive pulmonary disease (COPD) is a long-term (chronic) lung problem. When you have COPD, it is hard for air to get in and out of your lungs. Usually the condition gets worse over time, and your lungs will never return to normal. There are things you can do to keep yourself as healthy as possible.  Your doctor may treat your condition with: ? Medicines. ? Oxygen. ? Lung surgery.  Your doctor may also recommend: ? Rehabilitation. This includes steps to make your body work better. It may involve a team of specialists. ? Quitting smoking, if you smoke. ? Exercise and changes to your diet. ? Comfort measures (palliative care). Follow these instructions at home: Medicines  Take over-the-counter and prescription medicines only as told by your doctor.  Talk to your doctor before taking any cough or allergy medicines. You may need to avoid medicines that cause your lungs to be dry. Lifestyle  If you smoke, stop. Smoking makes the problem worse. If you need help quitting, ask your doctor.  Avoid being around things that make your breathing worse. This may include smoke, chemicals, and fumes.  Stay active, but remember to rest as well.  Learn and use tips on how to relax.  Make sure you get enough sleep. Most adults need at least 7 hours of sleep every night.  Eat healthy foods. Eat smaller meals more often. Rest before meals. Controlled breathing Learn and use tips on how to control your breathing as told by your doctor. Try:  Breathing in (inhaling) through your nose for 1 second. Then, pucker your lips and breath out (exhale) through your lips for 2 seconds.  Putting one hand on your belly (abdomen). Breathe in slowly through your nose for 1 second. Your hand on your belly should move out. Pucker your lips and breathe out slowly through your lips. Your hand on your belly should move in as you breathe out.  Controlled coughing Learn  and use controlled coughing to clear mucus from your lungs. Follow these steps: 1. Lean your head a little forward. 2. Breathe in deeply. 3. Try to hold your breath for 3 seconds. 4. Keep your mouth slightly open while coughing 2 times. 5. Spit any mucus out into a tissue. 6. Rest and do the steps again 1 or 2 times as needed. General instructions  Make sure you get all the shots (vaccines) that your doctor recommends. Ask your doctor about a flu shot and a pneumonia shot.  Use oxygen therapy and pulmonary rehabilitation if told by your doctor. If you need home oxygen therapy, ask your doctor if you should buy a tool to measure your oxygen level (oximeter).  Make a COPD action plan with your doctor. This helps you to know what to do if you feel worse than usual.  Manage any other conditions you have as told by your doctor.  Avoid going outside when it is very hot, cold, or humid.  Avoid people who have a sickness you can catch (contagious).  Keep all follow-up visits as told by your doctor. This is important. Contact a doctor if:  You cough up more mucus than usual.  There is a change in the color or thickness of the mucus.  It is harder to breathe than usual.  Your breathing is faster than usual.  You have trouble sleeping.  You need to use your medicines more often than usual.  You have trouble doing your normal activities such as getting dressed   or walking around the house. Get help right away if:  You have shortness of breath while resting.  You have shortness of breath that stops you from: ? Being able to talk. ? Doing normal activities.  Your chest hurts for longer than 5 minutes.  Your skin color is more blue than usual.  Your pulse oximeter shows that you have low oxygen for longer than 5 minutes.  You have a fever.  You feel too tired to breathe normally. Summary  Chronic obstructive pulmonary disease (COPD) is a long-term lung problem.  The way your  lungs work will never return to normal. Usually the condition gets worse over time. There are things you can do to keep yourself as healthy as possible.  Take over-the-counter and prescription medicines only as told by your doctor.  If you smoke, stop. Smoking makes the problem worse. This information is not intended to replace advice given to you by your health care provider. Make sure you discuss any questions you have with your health care provider. Document Released: 01/18/2008 Document Revised: 09/05/2016 Document Reviewed: 09/05/2016 Elsevier Interactive Patient Education  2019 [employer] Inc[employer]  

## 2018-11-01 ENCOUNTER — Ambulatory Visit: Payer: Medicare Other | Admitting: Family Medicine

## 2018-12-04 ENCOUNTER — Other Ambulatory Visit: Payer: Self-pay | Admitting: Nurse Practitioner

## 2019-01-31 ENCOUNTER — Other Ambulatory Visit: Payer: Self-pay

## 2019-01-31 ENCOUNTER — Ambulatory Visit (INDEPENDENT_AMBULATORY_CARE_PROVIDER_SITE_OTHER): Payer: Medicare Other | Admitting: Family Medicine

## 2019-01-31 ENCOUNTER — Encounter: Payer: Self-pay | Admitting: Family Medicine

## 2019-01-31 ENCOUNTER — Other Ambulatory Visit: Payer: Self-pay | Admitting: Family Medicine

## 2019-01-31 VITALS — BP 138/73 | HR 72 | Temp 98.0°F | Ht 59.0 in | Wt 126.0 lb

## 2019-01-31 DIAGNOSIS — N3001 Acute cystitis with hematuria: Secondary | ICD-10-CM

## 2019-01-31 DIAGNOSIS — R350 Frequency of micturition: Secondary | ICD-10-CM

## 2019-01-31 DIAGNOSIS — B379 Candidiasis, unspecified: Secondary | ICD-10-CM

## 2019-01-31 LAB — URINALYSIS, COMPLETE
Bilirubin, UA: NEGATIVE
Glucose, UA: NEGATIVE
Nitrite, UA: NEGATIVE
Protein,UA: NEGATIVE
Specific Gravity, UA: 1.025 (ref 1.005–1.030)
Urobilinogen, Ur: 0.2 mg/dL (ref 0.2–1.0)
pH, UA: 6 (ref 5.0–7.5)

## 2019-01-31 LAB — MICROSCOPIC EXAMINATION
Epithelial Cells (non renal): >10 /hpf — AB (ref 0–10)
Renal Epithel, UA: NONE SEEN /hpf
WBC, UA: >30 /hpf — AB (ref 0–5)

## 2019-01-31 MED ORDER — AMOXICILLIN-POT CLAVULANATE 875-125 MG PO TABS: 1.0000 | ORAL_TABLET | Freq: Two times a day (BID) | ORAL | 0 refills | Status: AC

## 2019-01-31 MED ORDER — FLUCONAZOLE 150 MG PO TABS: ORAL_TABLET | ORAL | 0 refills | Status: DC

## 2019-01-31 NOTE — Patient Instructions (Signed)

## 2019-01-31 NOTE — Progress Notes (Signed)
Amy Chambers, Amy Chambers, Amy Chambers Chief Complaint  Patient presents with  . Urinary Tract Infection    Current Issues:  Presents with 3 days of dysuria, urinary urgency and urinary frequency Associated symptoms include:  cloudy urine and lower abdominal pain She denies chills, weakness, fever, fatigue, confusion, or flank pain.   There is no history of of similar symptoms. Sexually active:  No   No concern for STI.  Prior to Admission medications   Medication Sig Start Date End Date Taking? Authorizing Provider  albuterol (VENTOLIN HFA) 108 (90 Base) MCG/ACT inhaler INHALE 2 PUFFS INTO LUNGS EVERY 6 HOURS AS NEEDED FOR WHEEZING 12/04/18   Daphine DeutscherMartin, Amy Chambers, Amy Chambers  aspirin 81 MG tablet Take 81 mg by mouth daily.    [provider]  benazepril (LOTENSIN) 20 MG tablet Take 1 tablet (20 mg total) by mouth daily. 08/30/18   Daphine DeutscherMartin, Amy Chambers, Amy Chambers  budesonide-formoterol (SYMBICORT) 160-4.5 MCG/ACT inhaler Inhale 2 puffs into the lungs 2 (two) times daily. 08/30/18   [name], Amy Chambers, Amy Chambers  calcium-vitamin D 250-100 MG-UNIT per tablet Take 1 tablet by mouth 2 (two) times daily.     [provider]  levothyroxine (SYNTHROID, LEVOTHROID) 75 MCG tablet Take 1 tablet (75 mcg total) by mouth daily. 08/30/18   Daphine DeutscherMartin, Amy Chambers, Amy Chambers  meclizine (ANTIVERT) 25 MG tablet Take 1 tablet (25 mg total) by mouth 3 (three) times daily as needed for dizziness. 12/27/16   Daphine DeutscherMartin, Amy Chambers, Amy Chambers  polyethylene glycol (MIRALAX / GLYCOLAX) packet Take 17 g by mouth daily.    [provider]   Review of Systems  Constitutional: Negative for chills, fever and malaise/fatigue.  Respiratory: Negative for shortness of breath.   Cardiovascular: Negative for chest pain.  Gastrointestinal: Positive for abdominal pain (lower abdominal pressure). Negative for constipation, diarrhea, nausea and vomiting.  Genitourinary: Positive for dysuria, frequency and urgency. Negative for flank  pain and hematuria.  Musculoskeletal: Negative for myalgias.  Neurological: Negative for dizziness and weakness.  All other systems reviewed and are negative.    PE:  BP 138/73   Pulse 72   Temp 98 F (36.7 C) (Oral)   Ht 4\' 11"  (1.499 m)   Wt 126 lb (57.2 kg)   BMI 25.45 kg/m  Physical Exam Vitals signs and nursing note reviewed.  Constitutional:      General: She is not in acute distress.    Appearance: Normal appearance. She is well-developed and well-groomed. She is not ill-appearing, toxic-appearing or diaphoretic.  HENT:     Head: Normocephalic and atraumatic.     Jaw: There is normal jaw occlusion.     Right Ear: Hearing normal.     Left Ear: Hearing normal.     Nose: Nose normal.     Mouth/Throat:     Lips: Pink.     Mouth: Mucous membranes are moist.     Pharynx: Oropharynx is clear. Uvula midline.  Eyes:     General: Lids are normal.     Extraocular Movements: Extraocular movements intact.     Conjunctiva/sclera: Conjunctivae normal.     Pupils: Pupils are equal, round, and reactive to light.  Neck:     Musculoskeletal: Normal range of motion and neck supple.     Thyroid: No thyroid mass, thyromegaly or thyroid tenderness.     Vascular: No carotid bruit or JVD.     Trachea: Trachea and phonation normal.  Cardiovascular:     Rate and Rhythm: Normal rate and regular rhythm.  Chest Wall: PMI is not displaced.     Pulses: Normal pulses.     Heart sounds: Normal heart sounds. No murmur. No friction rub. No gallop.   Pulmonary:     Effort: Pulmonary effort is normal. No respiratory distress.     Breath sounds: Normal breath sounds. No wheezing.  Abdominal:     General: Bowel sounds are normal. There is no distension or abdominal bruit.     Palpations: Abdomen is soft. There is no hepatomegaly or splenomegaly.     Tenderness: There is no abdominal tenderness. There is no right CVA tenderness or left CVA tenderness.     Hernia: No hernia is present.   Musculoskeletal: Normal range of motion.     Right lower leg: No edema.     Left lower leg: No edema.  Lymphadenopathy:     Cervical: No cervical adenopathy.  Skin:    General: Skin is warm and dry.     Capillary Refill: Capillary refill takes less than 2 seconds.     Coloration: Skin is not cyanotic, jaundiced or pale.     Findings: No rash.  Neurological:     General: No focal deficit present.     Mental Status: She is alert and oriented to person, place, and time.     Cranial Nerves: Cranial nerves are intact.     Sensory: Sensation is intact.     Motor: Motor function is intact.     Coordination: Coordination is intact.     Gait: Gait is intact.     Deep Tendon Reflexes: Reflexes are normal and symmetric.  Psychiatric:        Attention and Perception: Attention and perception normal.        Mood and Affect: Mood and affect normal.        Speech: Speech normal.        Behavior: Behavior normal. Behavior is cooperative.        Thought Content: Thought content normal.        Cognition and Memory: Cognition and memory normal.        Judgment: Judgment normal.      Results for orders placed or performed in visit on 10/26/18  Veritor Flu A/B Waived  Result Value Ref Range   Influenza A Negative Negative   Influenza B Negative Negative   Urinalysis in office: trace ketones, 2+ blood, 2+ leukocytes, negative nitrites, moderate bacteria, and yeast present.    Assessment and Plan  Amy Chambers was seen today for urinary tract infection.  Diagnoses and all orders for this visit:  Frequent urination Urinalysis as above. Culture added.  -     Urinalysis, Complete -     Urine Culture  Acute cystitis with hematuria Previous culture susceptible to Augmentin, culture pending. Will initiate Augmentin today. Will change therapy if culture warrants. Symptomatic care discussed. Increase water intake and avoid bladder irritants.  -     amoxicillin-clavulanate (AUGMENTIN) 875-125 MG  tablet; Take 1 tablet by mouth 2 (two) times daily for 7 days.  Yeast detected Yeast present in urine. Will treat with below. Pt aware of proper dosing.  -     fluconazole (DIFLUCAN) 150 MG tablet; 1 po q week x 4 weeks   The above assessment and management plan was discussed with the patient. The patient verbalized understanding of and has agreed to the management plan. Patient is aware to call the clinic if symptoms fail to improve or worsen. Patient is aware when to  return to the clinic for a follow-up visit. Patient educated on when it is appropriate to go to the emergency department.   Return in about 2 weeks (around 02/14/2019), or if symptoms worsen or fail to improve, for UTI recheck.   Kari BaarsMichelle Sholanda Croson, Amy Chambers-C [facility] [address] [place], [place] [address] [phone]

## 2019-02-02 LAB — URINE CULTURE

## 2019-02-14 ENCOUNTER — Other Ambulatory Visit: Payer: Self-pay

## 2019-02-14 ENCOUNTER — Other Ambulatory Visit: Payer: Medicare Other

## 2019-02-14 DIAGNOSIS — N3001 Acute cystitis with hematuria: Secondary | ICD-10-CM | POA: Diagnosis not present

## 2019-02-14 LAB — URINALYSIS, COMPLETE
Bilirubin, UA: NEGATIVE
Glucose, UA: NEGATIVE
Ketones, UA: NEGATIVE
Leukocytes,UA: NEGATIVE
Nitrite, UA: NEGATIVE
Protein,UA: NEGATIVE
RBC, UA: NEGATIVE
Specific Gravity, UA: 1.02 (ref 1.005–1.030)
Urobilinogen, Ur: 0.2 mg/dL (ref 0.2–1.0)
pH, UA: 5 (ref 5.0–7.5)

## 2019-02-14 LAB — MICROSCOPIC EXAMINATION
Bacteria, UA: NONE SEEN
RBC, Urine: NONE SEEN /hpf (ref 0–2)

## 2019-02-15 LAB — URINE CULTURE

## 2019-02-22 ENCOUNTER — Other Ambulatory Visit: Payer: Self-pay | Admitting: Family

## 2019-02-22 ENCOUNTER — Telehealth: Payer: Self-pay | Admitting: Nurse Practitioner

## 2019-02-22 ENCOUNTER — Other Ambulatory Visit: Payer: Medicare Other

## 2019-02-22 DIAGNOSIS — N3000 Acute cystitis without hematuria: Secondary | ICD-10-CM | POA: Diagnosis not present

## 2019-02-22 DIAGNOSIS — R300 Dysuria: Secondary | ICD-10-CM

## 2019-02-22 LAB — MICROSCOPIC EXAMINATION
RBC: >30 /hpf — AB (ref 0–2)
WBC, UA: >30 /hpf — AB (ref 0–5)

## 2019-02-22 LAB — URINALYSIS, COMPLETE
Bilirubin, UA: NEGATIVE
Glucose, UA: NEGATIVE
Ketones, UA: NEGATIVE
Nitrite, UA: NEGATIVE
Specific Gravity, UA: 1.025 (ref 1.005–1.030)
Urobilinogen, Ur: 0.2 mg/dL (ref 0.2–1.0)
pH, UA: 6 (ref 5.0–7.5)

## 2019-02-22 MED ORDER — CEPHALEXIN 500 MG PO CAPS: 500.0000 mg | ORAL_CAPSULE | Freq: Two times a day (BID) | ORAL | 0 refills | Status: DC

## 2019-02-23 ENCOUNTER — Other Ambulatory Visit (INDEPENDENT_AMBULATORY_CARE_PROVIDER_SITE_OTHER): Payer: Medicare Other | Admitting: Family

## 2019-02-23 DIAGNOSIS — N3001 Acute cystitis with hematuria: Secondary | ICD-10-CM

## 2019-02-23 NOTE — Progress Notes (Signed)
   Virtual Visit via telephone Note  I connected with Amy Chambers on 02/22/19 at 8:01 pm by telephone and verified that I am speaking with the correct person using two identifiers. Amy Chambers is currently located at home and daughter is currently with her during visit. The provider, Evelina Dun, [name] is located in their office at time of visit.  I discussed the limitations, risks, security and privacy concerns of performing an evaluation and management service by telephone and the availability of in person appointments. I also discussed with the patient that there may be a patient responsible charge related to this service. The patient expressed understanding and agreed to proceed.   History and Present Illness:  Daughter calls stating her mother's UTI symptoms have returned. She was treated for a UTI on 06/18//20 with Augmentin. Her symptoms resolved and had a negative repeat urine culture after completion of her antibiotics. She states over the last week her symptoms have returned and worsen. She is worried as it is Friday evening and does want her symptoms to worsen through the weekend. She did bring a sample to the office earlier.  Dysuria  This is a recurrent problem. The current episode started in the past 7 days. The problem occurs intermittently. The problem has been waxing and waning. The quality of the pain is described as burning. The pain is mild. Associated symptoms include frequency and urgency. Pertinent negatives include no discharge, flank pain, hematuria or nausea. She has tried increased fluids for the symptoms. The treatment provided no relief.      Review of Systems  Gastrointestinal: Negative for nausea.  Genitourinary: Positive for dysuria, frequency and urgency. Negative for flank pain and hematuria.     Observations/Objective: No SOB or distress noted, daughter did all the talking  Assessment and Plan: 1. Acute cystitis with hematuria Force fluids RTO if  symptoms worsen or do not improve  Keflex 500 mg BID for 7 days     I discussed the assessment and treatment plan with the patient. The patient was provided an opportunity to ask questions and all were answered. The patient agreed with the plan and demonstrated an understanding of the instructions.   The patient was advised to call back or seek an in-person evaluation if the symptoms worsen or if the condition fails to improve as anticipated.  The above assessment and management plan was discussed with the patient. The patient verbalized understanding of and has agreed to the management plan. Patient is aware to call the clinic if symptoms persist or worsen. Patient is aware when to return to the clinic for a follow-up visit. Patient educated on when it is appropriate to go to the emergency department.   Time call ended:  8:12 pm  I provided  11 minutes of non-face-to-face time during this encounter.    Evelina Dun, [name]

## 2019-02-28 ENCOUNTER — Telehealth: Payer: Self-pay | Admitting: Family

## 2019-03-04 ENCOUNTER — Ambulatory Visit (INDEPENDENT_AMBULATORY_CARE_PROVIDER_SITE_OTHER): Payer: Medicare Other | Admitting: Nurse Practitioner

## 2019-03-04 ENCOUNTER — Emergency Department (HOSPITAL_COMMUNITY): Payer: Medicare Other

## 2019-03-04 ENCOUNTER — Other Ambulatory Visit: Payer: Self-pay

## 2019-03-04 ENCOUNTER — Encounter (HOSPITAL_COMMUNITY): Payer: Self-pay

## 2019-03-04 ENCOUNTER — Encounter: Payer: Self-pay | Admitting: Nurse Practitioner

## 2019-03-04 ENCOUNTER — Emergency Department (HOSPITAL_COMMUNITY)
Admission: EM | Admit: 2019-03-04 | Discharge: 2019-03-04 | Disposition: A | Discharge status: Home / Self Care | Payer: Medicare Other | Attending: Emergency Medicine | Admitting: Emergency Medicine

## 2019-03-04 DIAGNOSIS — K219 Gastro-esophageal reflux disease without esophagitis: Secondary | ICD-10-CM | POA: Insufficient documentation

## 2019-03-04 DIAGNOSIS — Z7982 Long term (current) use of aspirin: Secondary | ICD-10-CM | POA: Diagnosis not present

## 2019-03-04 DIAGNOSIS — R1310 Dysphagia, unspecified: Secondary | ICD-10-CM | POA: Diagnosis not present

## 2019-03-04 DIAGNOSIS — R079 Chest pain, unspecified: Secondary | ICD-10-CM | POA: Diagnosis not present

## 2019-03-04 DIAGNOSIS — E039 Hypothyroidism, unspecified: Secondary | ICD-10-CM | POA: Diagnosis not present

## 2019-03-04 DIAGNOSIS — J410 Simple chronic bronchitis: Secondary | ICD-10-CM

## 2019-03-04 DIAGNOSIS — M8588 Other specified disorders of bone density and structure, other site: Secondary | ICD-10-CM

## 2019-03-04 DIAGNOSIS — Z87891 Personal history of nicotine dependence: Secondary | ICD-10-CM | POA: Diagnosis not present

## 2019-03-04 DIAGNOSIS — Z79899 Other long term (current) drug therapy: Secondary | ICD-10-CM | POA: Insufficient documentation

## 2019-03-04 DIAGNOSIS — I10 Essential (primary) hypertension: Secondary | ICD-10-CM | POA: Insufficient documentation

## 2019-03-04 DIAGNOSIS — E782 Mixed hyperlipidemia: Secondary | ICD-10-CM

## 2019-03-04 LAB — CBC
HCT: 43.3 % (ref 36.0–46.0)
Hemoglobin: 13.3 g/dL (ref 12.0–15.0)
MCH: 29.8 pg (ref 26.0–34.0)
MCHC: 30.7 g/dL (ref 30.0–36.0)
MCV: 97.1 fL (ref 80.0–100.0)
Platelets: 270 10*3/uL (ref 150–400)
RBC: 4.46 MIL/uL (ref 3.87–5.11)
RDW: 13.8 % (ref 11.5–15.5)
WBC: 10.2 10*3/uL (ref 4.0–10.5)
nRBC: 0 % (ref 0.0–0.2)

## 2019-03-04 LAB — TROPONIN I (HIGH SENSITIVITY)
Troponin I (High Sensitivity): 5 ng/L (ref <18)
Troponin I (High Sensitivity): 6 ng/L (ref <18)

## 2019-03-04 LAB — BASIC METABOLIC PANEL
Anion gap: 8 (ref 5–15)
BUN: 27 mg/dL — ABNORMAL HIGH (ref 8–23)
CO2: 25 mmol/L (ref 22–32)
Calcium: 9.2 mg/dL (ref 8.9–10.3)
Chloride: 107 mmol/L (ref 98–111)
Creatinine, Ser: 1.05 mg/dL — ABNORMAL HIGH (ref 0.44–1.00)
GFR calc Af Amer: 55 mL/min — ABNORMAL LOW (ref >60)
GFR calc non Af Amer: 47 mL/min — ABNORMAL LOW (ref >60)
Glucose, Bld: 104 mg/dL — ABNORMAL HIGH (ref 70–99)
Potassium: 4.5 mmol/L (ref 3.5–5.1)
Sodium: 140 mmol/L (ref 135–145)

## 2019-03-04 MED ORDER — SODIUM CHLORIDE 0.9% FLUSH: 3.0000 mL | Freq: Once | INTRAVENOUS | Status: DC

## 2019-03-04 MED ORDER — BENAZEPRIL HCL 20 MG PO TABS: 20.0000 mg | ORAL_TABLET | Freq: Every day | ORAL | 1 refills | Status: DC

## 2019-03-04 MED ORDER — BUDESONIDE-FORMOTEROL FUMARATE 160-4.5 MCG/ACT IN AERO: 2.0000 | INHALATION_SPRAY | Freq: Two times a day (BID) | RESPIRATORY_TRACT | 3 refills | Status: DC

## 2019-03-04 MED ORDER — LEVOTHYROXINE SODIUM 75 MCG PO TABS: 75.0000 ug | ORAL_TABLET | Freq: Every day | ORAL | 1 refills | Status: DC

## 2019-03-04 NOTE — ED Notes (Signed)
Patient tolerating fluid and crackers.

## 2019-03-04 NOTE — ED Triage Notes (Signed)
Pt presents to ED with complaints of mid chest pain and pain between shoulder blades which started at 0830 this morning. Pt states she had just finished eating breakfast, once the pain started she started "belching and spit up white mucous stuff" Pt denies SOB

## 2019-03-04 NOTE — Progress Notes (Signed)
Virtual Visit via telephone Note  I connected with Amy Chambers on 03/04/19 at 8:00 by telephone and verified that I am speaking with the correct person using two identifiers. Amy Chambers is currently located at home and her daughter is currently with her during visit. The provider, [name] Hassell Done, [name] is located in their office at time of visit.  I discussed the limitations, risks, security and privacy concerns of performing an evaluation and management service by telephone and the availability of in person appointments. I also discussed with the patient that there may be a patient responsible charge related to this service. The patient expressed understanding and agreed to proceed.   History and Present Illness:   Chief Complaint: Medical Management of Chronic Issues    HPI:  1. Essential hypertension No c/o chest pain, sob or headache. Does not check blood pressure at home. BP Readings from Last 3 Encounters:  01/31/19 138/73  10/26/18 (!) 160/76  08/30/18 132/65    2. Mixed hyperlipidemia Does not watch diet. Has a very poor appetite. Not able to do much exercise.  3. Hypothyroidism, unspecified type No problems that she is awareof  4. Gastroesophageal reflux disease without esophagitis On no rx meds for this. Uses OTC meds when needed.  5. Simple chronic bronchitis (HCC) Has slight cough occasionally. Has not needed albuterol inhaler lately. Uses symbicort daily.  6. Osteopenia of lumbar spine Last dexascan was done in 2015. She is no longer doing scans due to age.    Outpatient Encounter Medications as of 03/04/2019  Medication Sig  . albuterol (VENTOLIN HFA) 108 (90 Base) MCG/ACT inhaler INHALE 2 PUFFS INTO LUNGS EVERY 6 HOURS AS NEEDED FOR WHEEZING  . aspirin 81 MG tablet Take 81 mg by mouth daily.  . benazepril (LOTENSIN) 20 MG tablet Take 1 tablet (20 mg total) by mouth daily.  . budesonide-formoterol (SYMBICORT) 160-4.5 MCG/ACT inhaler Inhale 2 puffs  into the lungs 2 (two) times daily.  . calcium-vitamin D 250-100 MG-UNIT per tablet Take 1 tablet by mouth 2 (two) times daily.   . cephALEXin (KEFLEX) 500 MG capsule Take 1 capsule (500 mg total) by mouth 2 (two) times daily.  . fluconazole (DIFLUCAN) 150 MG tablet 1 po q week x 4 weeks  . levothyroxine (SYNTHROID, LEVOTHROID) 75 MCG tablet Take 1 tablet (75 mcg total) by mouth daily.  . meclizine (ANTIVERT) 25 MG tablet Take 1 tablet (25 mg total) by mouth 3 (three) times daily as needed for dizziness.  . polyethylene glycol (MIRALAX / GLYCOLAX) packet Take 17 g by mouth daily.     Past Surgical History:  Procedure Laterality Date  . CATARACT EXTRACTION, BILATERAL      Family History  Problem Relation Age of Onset  . Diabetes Mother   . Cancer Brother   . Tuberculosis Father   . Early death Sister   . Early death Brother   . COPD Brother   . Emphysema Brother   . Early death Sister   . COPD Sister   . Emphysema Sister   . Aneurysm Sister   . Stroke Sister   . Cancer Sister   . Hypertension Son   . Hypertension Daughter   . Fibromyalgia Daughter   . Hypertension Daughter     New complaints: None today  Social history: Lives by herself and her family visits daily.  Controlled substance contract: N/A    Review of Systems  Constitutional: Negative for diaphoresis and weight loss.  Eyes: Negative  for blurred vision, double vision and pain.  Respiratory: Negative for shortness of breath.   Cardiovascular: Negative for chest pain, palpitations, orthopnea and leg swelling.  Gastrointestinal: Negative for abdominal pain.  Musculoskeletal: Positive for back pain (occasional).  Skin: Negative for rash.  Neurological: Negative for dizziness, sensory change, loss of consciousness, weakness and headaches.  Endo/Heme/Allergies: Negative for polydipsia. Does not bruise/bleed easily.  Psychiatric/Behavioral: Negative for memory loss. The patient does not have insomnia.   All  other systems reviewed and are negative.    Observations/Objective: Alert and oriented- answers all questions appropriately No distress today  Assessment and Plan: [name] comes in today with chief complaint of Medical Management of Chronic Issues   Diagnosis and orders addressed:  1. Essential hypertension Low sodium diet - benazepril (LOTENSIN) 20 MG tablet; Take 1 tablet (20 mg total) by mouth daily.  Dispense: 90 tablet; Refill: 1  2. Mixed hyperlipidemia Low fat diet  3. Acquired hypothyroidism - levothyroxine (SYNTHROID) 75 MCG tablet; Take 1 tablet (75 mcg total) by mouth daily.  Dispense: 90 tablet; Refill: 1  4. Gastroesophageal reflux disease without esophagitis Avoid spicy foods Do not eat 2 hours prior to bedtime  5. Simple chronic bronchitis (HCC) Avoid going out in the heat - budesonide-formoterol (SYMBICORT) 160-4.5 MCG/ACT inhaler; Inhale 2 puffs into the lungs 2 (two) times daily.  Dispense: 1 Inhaler; Refill: 3  6. Osteopenia of lumbar spine Weight bearing exercises when possibly   Labs pending Health Maintenance reviewed Diet and exercise encouraged  Follow up plan: 3 months      I discussed the assessment and treatment plan with the patient. The patient was provided an opportunity to ask questions and all were answered. The patient agreed with the plan and demonstrated an understanding of the instructions.   The patient was advised to call back or seek an in-person evaluation if the symptoms worsen or if the condition fails to improve as anticipated.  The above assessment and management plan was discussed with the patient. The patient verbalized understanding of and has agreed to the management plan. Patient is aware to call the clinic if symptoms persist or worsen. Patient is aware when to return to the clinic for a follow-up visit. Patient educated on when it is appropriate to go to the emergency department.   Time call ended:  8:17  I  provided 17 minutes of non-face-to-face time during this encounter.    [name] Daphine DeutscherMartin, [name]

## 2019-03-04 NOTE — ED Provider Notes (Signed)
[facility] EMERGENCY DEPARTMENT Provider Note   CSN: [id number] Arrival date & time: 03/04/19  1225    History   Chief Complaint Chief Complaint  Patient presents with  . Chest Pain    HPI Amy Chambers is a 85 y.o. female.     85 year old female presents with complaint of choking episode earlier today.  Patient states that she was eating breakfast when she began coughing up stringy mucus.  Patient states her daughter became concerned and brought her to the emergency room to be seen, patient felt like she was well and did not need to come today.  Patient feels well at this time, she denies any chest pain, shortness of breath (has COPD, nothing changed from baseline), abdominal pain.  No other complaints or concerns.     Past Medical History:  Diagnosis Date  . Cataract   . GERD (gastroesophageal reflux disease)   . Hypertension   . Osteopenia   . Thyroid disease     Patient Active Problem List   Diagnosis Date Noted  . Simple chronic bronchitis ([place]) 05/24/2018  . HTN (hypertension) 12/01/2010  . Hyperlipemia 12/01/2010  . Hypothyroidism 12/01/2010  . Osteopenia 12/01/2010  . GERD (gastroesophageal reflux disease) 12/01/2010    Past Surgical History:  Procedure Laterality Date  . CATARACT EXTRACTION, BILATERAL       OB History   No obstetric history on file.      Home Medications    Prior to Admission medications   Medication Sig Start Date End Date Taking? Authorizing Provider  albuterol (VENTOLIN HFA) 108 (90 Base) MCG/ACT inhaler INHALE 2 PUFFS INTO LUNGS EVERY 6 HOURS AS NEEDED FOR WHEEZING Patient taking differently: Inhale 2 puffs into the lungs every 6 (six) hours as needed for wheezing or shortness of breath.  12/04/18  Yes Hassell Done, [name], [name]  aspirin 81 MG tablet Take 81 mg by mouth daily.   Yes [provider]  benazepril (LOTENSIN) 20 MG tablet Take 1 tablet (20 mg total) by mouth daily. 03/04/19  Yes [name], [name], [name]   budesonide-formoterol (SYMBICORT) 160-4.5 MCG/ACT inhaler Inhale 2 puffs into the lungs 2 (two) times daily. 03/04/19  Yes Hassell Done, [name], [name]  calcium-vitamin D 250-100 MG-UNIT per tablet Take 1 tablet by mouth 2 (two) times daily.    Yes [provider]  levothyroxine (SYNTHROID) 75 MCG tablet Take 1 tablet (75 mcg total) by mouth daily. 03/04/19  Yes [name], [name], [name]  polyethylene glycol (MIRALAX / GLYCOLAX) packet Take 17 g by mouth daily.   Yes [provider]  SYMBICORT 160-4.5 MCG/ACT inhaler Inhale 2 puffs into the lungs 2 (two) times a day.  02/05/19  Yes [provider]  VITAMIN D PO Take 1 capsule by mouth daily.   Yes [provider]    Family History Family History  Problem Relation Age of Onset  . Diabetes Mother   . Cancer Brother   . Tuberculosis Father   . Early death Sister   . Early death Brother   . COPD Brother   . Emphysema Brother   . Early death Sister   . COPD Sister   . Emphysema Sister   . Aneurysm Sister   . Stroke Sister   . Cancer Sister   . Hypertension Son   . Hypertension Daughter   . Fibromyalgia Daughter   . Hypertension Daughter     Social History Social History   Tobacco Use  . Smoking status: Former Smoker  Quit date: 08/15/1994    Years since quitting: 24.5  . Smokeless tobacco: Former NeurosurgeonUser    Types: Chew    Quit date: 08/15/1998  Substance Use Topics  . Alcohol use: No  . Drug use: No     Allergies   Crestor [rosuvastatin calcium] and Niaspan [niacin er]   Review of Systems Review of Systems  Constitutional: Negative for chills, diaphoresis and fever.  Respiratory: Positive for choking. Negative for chest tightness and shortness of breath.   Cardiovascular: Negative for chest pain.  Gastrointestinal: Negative for abdominal pain, constipation, diarrhea, nausea and vomiting.  Genitourinary: Negative for dysuria, frequency and urgency.  Skin: Negative for rash and wound.   Neurological: Negative for weakness.  Psychiatric/Behavioral: Negative for confusion.  All other systems reviewed and are negative.    Physical Exam Updated Vital Signs BP (!) 186/77   Pulse 75   Temp 98.1 F (36.7 C) (Oral)   Resp 18   Ht 4\' 11"  (1.499 m)   Wt 57.2 kg   SpO2 95%   BMI 25.45 kg/m   Physical Exam Vitals signs and nursing note reviewed.  Constitutional:      Appearance: She is well-developed.  HENT:     Head: Normocephalic and atraumatic.  Cardiovascular:     Rate and Rhythm: Normal rate and regular rhythm.     Heart sounds: Normal heart sounds. No murmur.  Pulmonary:     Effort: Pulmonary effort is normal.     Breath sounds: Normal breath sounds. No decreased breath sounds.  Chest:     Chest wall: No tenderness.  Abdominal:     Palpations: Abdomen is soft.     Tenderness: There is no abdominal tenderness.  Musculoskeletal:     Right lower leg: She exhibits no tenderness. No edema.     Left lower leg: She exhibits no tenderness. No edema.  Skin:    General: Skin is warm and dry.  Neurological:     Mental Status: She is alert and oriented to person, place, and time.  Psychiatric:        Behavior: Behavior normal.      ED Treatments / Results  Labs (all labs ordered are listed, but only abnormal results are displayed) Labs Reviewed  BASIC METABOLIC PANEL - Abnormal; Notable for the following components:      Result Value   Glucose, Bld 104 (*)    BUN 27 (*)    Creatinine, Ser 1.05 (*)    GFR calc non Af Amer 47 (*)    GFR calc Af Amer 55 (*)    All other components within normal limits  CBC  TROPONIN I (HIGH SENSITIVITY)  TROPONIN I (HIGH SENSITIVITY)    EKG None  Date: 03/04/2019  Rate: 68  Rhythm: normal sinus rhythm  QRS Axis: normal  Intervals: normal  ST/T Wave abnormalities: normal  Conduction Disutrbances: none  Narrative Interpretation:   Old EKG Reviewed: No significant changes noted    Radiology Dg Chest 2 View   Result Date: 03/04/2019 CLINICAL DATA:  Onset chest pain this morning. EXAM: CHEST - 2 VIEW COMPARISON:  PA and lateral chest 09/21/2017 and 10/15/2014. FINDINGS: The lungs are emphysematous. Mild tenting of the left hemidiaphragm is unchanged. No pneumothorax or pleural effusion. Heart size is normal. Atherosclerosis noted. No acute or focal bony abnormality. IMPRESSION: No acute disease. Emphysema. Atherosclerosis. Electronically Signed   By: Drusilla Kannerhomas  Dalessio M.D.   On: 03/04/2019 13:49    Procedures Procedures (including critical  care time)  Medications Ordered in ED Medications  sodium chloride flush (NS) 0.9 % injection 3 mL (has no administration in time range)     Initial Impression / Assessment and Plan / ED Course  I have reviewed the triage vital signs and the nursing notes.  Pertinent labs & imaging results that were available during my care of the patient were reviewed by me and considered in my medical decision making (see chart for details).  Clinical Course as of Mar 03 1721  Mon Mar 04, 2019  [id number] 85 year old female presents with complaint of choking episode today.  Patient was eating her breakfast when she began to cough, coughed up thick stringy mucus.  Patient has a history of COPD, states that her mucus is not normally dyspneic.  Patient has not had any difficulty breathing or swallowing since this episode, denies chest pain.  On exam patient is well-appearing, states she feels like she would like to go home.  CBC, BMP, troponin x2 without significant findings today.  EKG without acute ischemic changes, chest x-ray without acute findings.  Patient be discharged to recheck with PCP, recommend soft thin diet, return to ER for new or worsening symptoms.   [LM]    Clinical Course User Index [LM] Jeannie FendMurphy, Navarro Nine A, PA-C      Final Clinical Impressions(s) / ED Diagnoses   Final diagnoses:  Dysphagia, unspecified type    ED Discharge Orders    None       Jeannie FendMurphy,  Taraann Olthoff A, PA-C 03/04/19 1722    Donnetta Hutchingook, Brian, MD 03/05/19 [phone]

## 2019-03-04 NOTE — Discharge Instructions (Signed)
Dietary recommendation for soft thin foods. Recheck with your family doctor, return to ER for new or worsening symptoms.

## 2019-05-09 ENCOUNTER — Encounter: Payer: Self-pay | Admitting: Nurse Practitioner

## 2019-05-09 ENCOUNTER — Other Ambulatory Visit: Payer: Self-pay

## 2019-05-09 ENCOUNTER — Ambulatory Visit (INDEPENDENT_AMBULATORY_CARE_PROVIDER_SITE_OTHER): Payer: Medicare Other | Admitting: Nurse Practitioner

## 2019-05-09 DIAGNOSIS — H9201 Otalgia, right ear: Secondary | ICD-10-CM | POA: Diagnosis not present

## 2019-05-09 MED ORDER — FLUTICASONE PROPIONATE 50 MCG/ACT NA SUSP: 2.0000 | Freq: Every day | NASAL | 6 refills | Status: DC

## 2019-05-09 NOTE — Progress Notes (Signed)
   Virtual Visit via telephone Note Due to COVID-19 pandemic this visit was conducted virtually. This visit type was conducted due to national recommendations for restrictions regarding the COVID-19 Pandemic (e.g. social distancing, sheltering in place) in an effort to limit this patient's exposure and mitigate transmission in our community. All issues noted in this document were discussed and addressed.  A physical exam was not performed with this format.  I connected with Amy Chambers on 05/09/19 at 11:00 by telephone and verified that I am speaking with the correct person using two identifiers. Amy Chambers is currently located at [facility] and no one is currently with her during visit. The provider, [name] Hassell Done, [name] is located in their office at time of visit.  I discussed the limitations, risks, security and privacy concerns of performing an evaluation and management service by telephone and the availability of in person appointments. I also discussed with the patient that there may be a patient responsible charge related to this service. The patient expressed understanding and agreed to proceed.   History and Present Illness:   Chief Complaint: Otalgia   HPI Patient and her daughter have called in for appointment with her c/o shoot ear pains in right ear and shooting down neck. Comes and goes quickly. Started yesterday and kept her up al night. Does not hurt to wiggle ear or touch ear. Denies sore throat or fever.    Review of Systems  Constitutional: Negative.   HENT: Positive for ear pain. Negative for ear discharge.   Respiratory: Negative.   Cardiovascular: Negative.   Gastrointestinal: Negative.   Neurological: Negative.   Psychiatric/Behavioral: Negative.   All other systems reviewed and are negative.    Observations/Objective: Alert and oriented- answers all questions appropriately No distress    Assessment and Plan: Amy Chambers in today with chief complaint  of Otalgia   1. Right ear pain Motrin OTC Meds ordered this encounter  Medications  . fluticasone (FLONASE) 50 MCG/ACT nasal spray    Sig: Place 2 sprays into both nostrils daily.    Dispense:  16 g    Refill:  6    Order Specific Question:   Supervising Provider    Answer:   Caryl Pina A [[id number]]      Follow Up Instructions: Call if not helping    I discussed the assessment and treatment plan with the patient. The patient was provided an opportunity to ask questions and all were answered. The patient agreed with the plan and demonstrated an understanding of the instructions.   The patient was advised to call back or seek an in-person evaluation if the symptoms worsen or if the condition fails to improve as anticipated.  The above assessment and management plan was discussed with the patient. The patient verbalized understanding of and has agreed to the management plan. Patient is aware to call the clinic if symptoms persist or worsen. Patient is aware when to return to the clinic for a follow-up visit. Patient educated on when it is appropriate to go to the emergency department.   Time call ended:  11:10  I provided 10 minutes of non-face-to-face time during this encounter.    [name] Hassell Done, [name]

## 2019-05-10 ENCOUNTER — Inpatient Hospital Stay (HOSPITAL_COMMUNITY)
Admission: EM | Admit: 2019-05-10 | Discharge: 2019-05-31 | DRG: 270 | Disposition: A | Discharge status: Skilled Nursing Facility | Payer: Medicare Other | Attending: Cardiovascular Disease | Admitting: Cardiovascular Disease

## 2019-05-10 ENCOUNTER — Emergency Department (HOSPITAL_COMMUNITY): Payer: Medicare Other

## 2019-05-10 ENCOUNTER — Encounter (HOSPITAL_COMMUNITY): Payer: Self-pay | Admitting: *Deleted

## 2019-05-10 ENCOUNTER — Other Ambulatory Visit: Payer: Self-pay

## 2019-05-10 ENCOUNTER — Telehealth: Payer: Self-pay | Admitting: Nurse Practitioner

## 2019-05-10 DIAGNOSIS — J441 Chronic obstructive pulmonary disease with (acute) exacerbation: Secondary | ICD-10-CM | POA: Diagnosis present

## 2019-05-10 DIAGNOSIS — Z7982 Long term (current) use of aspirin: Secondary | ICD-10-CM

## 2019-05-10 DIAGNOSIS — J9600 Acute respiratory failure, unspecified whether with hypoxia or hypercapnia: Secondary | ICD-10-CM | POA: Diagnosis not present

## 2019-05-10 DIAGNOSIS — J9601 Acute respiratory failure with hypoxia: Secondary | ICD-10-CM | POA: Diagnosis not present

## 2019-05-10 DIAGNOSIS — I495 Sick sinus syndrome: Secondary | ICD-10-CM | POA: Diagnosis not present

## 2019-05-10 DIAGNOSIS — K5900 Constipation, unspecified: Secondary | ICD-10-CM | POA: Diagnosis not present

## 2019-05-10 DIAGNOSIS — Z66 Do not resuscitate: Secondary | ICD-10-CM

## 2019-05-10 DIAGNOSIS — R1310 Dysphagia, unspecified: Secondary | ICD-10-CM | POA: Diagnosis not present

## 2019-05-10 DIAGNOSIS — J449 Chronic obstructive pulmonary disease, unspecified: Secondary | ICD-10-CM | POA: Diagnosis not present

## 2019-05-10 DIAGNOSIS — Z20828 Contact with and (suspected) exposure to other viral communicable diseases: Secondary | ICD-10-CM | POA: Diagnosis not present

## 2019-05-10 DIAGNOSIS — R570 Cardiogenic shock: Secondary | ICD-10-CM | POA: Diagnosis not present

## 2019-05-10 DIAGNOSIS — Z4659 Encounter for fitting and adjustment of other gastrointestinal appliance and device: Secondary | ICD-10-CM

## 2019-05-10 DIAGNOSIS — E039 Hypothyroidism, unspecified: Secondary | ICD-10-CM | POA: Diagnosis not present

## 2019-05-10 DIAGNOSIS — I214 Non-ST elevation (NSTEMI) myocardial infarction: Principal | ICD-10-CM | POA: Diagnosis present

## 2019-05-10 DIAGNOSIS — Z888 Allergy status to other drugs, medicaments and biological substances status: Secondary | ICD-10-CM

## 2019-05-10 DIAGNOSIS — J939 Pneumothorax, unspecified: Secondary | ICD-10-CM

## 2019-05-10 DIAGNOSIS — I2510 Atherosclerotic heart disease of native coronary artery without angina pectoris: Secondary | ICD-10-CM

## 2019-05-10 DIAGNOSIS — Z4682 Encounter for fitting and adjustment of non-vascular catheter: Secondary | ICD-10-CM | POA: Diagnosis not present

## 2019-05-10 DIAGNOSIS — Z7901 Long term (current) use of anticoagulants: Secondary | ICD-10-CM

## 2019-05-10 DIAGNOSIS — Z741 Need for assistance with personal care: Secondary | ICD-10-CM | POA: Diagnosis not present

## 2019-05-10 DIAGNOSIS — R7401 Elevation of levels of liver transaminase levels: Secondary | ICD-10-CM | POA: Diagnosis not present

## 2019-05-10 DIAGNOSIS — Y840 Cardiac catheterization as the cause of abnormal reaction of the patient, or of later complication, without mention of misadventure at the time of the procedure: Secondary | ICD-10-CM | POA: Diagnosis not present

## 2019-05-10 DIAGNOSIS — Z8249 Family history of ischemic heart disease and other diseases of the circulatory system: Secondary | ICD-10-CM

## 2019-05-10 DIAGNOSIS — R443 Hallucinations, unspecified: Secondary | ICD-10-CM | POA: Diagnosis not present

## 2019-05-10 DIAGNOSIS — Z9861 Coronary angioplasty status: Secondary | ICD-10-CM

## 2019-05-10 DIAGNOSIS — H9201 Otalgia, right ear: Secondary | ICD-10-CM | POA: Diagnosis present

## 2019-05-10 DIAGNOSIS — M8580 Other specified disorders of bone density and structure, unspecified site: Secondary | ICD-10-CM | POA: Diagnosis present

## 2019-05-10 DIAGNOSIS — Z6824 Body mass index (BMI) 24.0-24.9, adult: Secondary | ICD-10-CM

## 2019-05-10 DIAGNOSIS — K7689 Other specified diseases of liver: Secondary | ICD-10-CM | POA: Diagnosis not present

## 2019-05-10 DIAGNOSIS — I9751 Accidental puncture and laceration of a circulatory system organ or structure during a circulatory system procedure: Secondary | ICD-10-CM | POA: Diagnosis not present

## 2019-05-10 DIAGNOSIS — R0602 Shortness of breath: Secondary | ICD-10-CM

## 2019-05-10 DIAGNOSIS — M6281 Muscle weakness (generalized): Secondary | ICD-10-CM | POA: Diagnosis not present

## 2019-05-10 DIAGNOSIS — J440 Chronic obstructive pulmonary disease with acute lower respiratory infection: Secondary | ICD-10-CM | POA: Diagnosis present

## 2019-05-10 DIAGNOSIS — M859 Disorder of bone density and structure, unspecified: Secondary | ICD-10-CM | POA: Diagnosis not present

## 2019-05-10 DIAGNOSIS — I314 Cardiac tamponade: Secondary | ICD-10-CM | POA: Diagnosis not present

## 2019-05-10 DIAGNOSIS — I110 Hypertensive heart disease with heart failure: Secondary | ICD-10-CM | POA: Diagnosis not present

## 2019-05-10 DIAGNOSIS — Z7401 Bed confinement status: Secondary | ICD-10-CM | POA: Diagnosis not present

## 2019-05-10 DIAGNOSIS — D72829 Elevated white blood cell count, unspecified: Secondary | ICD-10-CM

## 2019-05-10 DIAGNOSIS — K219 Gastro-esophageal reflux disease without esophagitis: Secondary | ICD-10-CM | POA: Diagnosis not present

## 2019-05-10 DIAGNOSIS — Z7951 Long term (current) use of inhaled steroids: Secondary | ICD-10-CM

## 2019-05-10 DIAGNOSIS — Z955 Presence of coronary angioplasty implant and graft: Secondary | ICD-10-CM | POA: Diagnosis not present

## 2019-05-10 DIAGNOSIS — I361 Nonrheumatic tricuspid (valve) insufficiency: Secondary | ICD-10-CM | POA: Diagnosis not present

## 2019-05-10 DIAGNOSIS — R627 Adult failure to thrive: Secondary | ICD-10-CM | POA: Diagnosis not present

## 2019-05-10 DIAGNOSIS — I4891 Unspecified atrial fibrillation: Secondary | ICD-10-CM | POA: Diagnosis not present

## 2019-05-10 DIAGNOSIS — I10 Essential (primary) hypertension: Secondary | ICD-10-CM | POA: Diagnosis not present

## 2019-05-10 DIAGNOSIS — J189 Pneumonia, unspecified organism: Secondary | ICD-10-CM | POA: Diagnosis not present

## 2019-05-10 DIAGNOSIS — E785 Hyperlipidemia, unspecified: Secondary | ICD-10-CM | POA: Diagnosis present

## 2019-05-10 DIAGNOSIS — R7989 Other specified abnormal findings of blood chemistry: Secondary | ICD-10-CM | POA: Diagnosis not present

## 2019-05-10 DIAGNOSIS — I480 Paroxysmal atrial fibrillation: Secondary | ICD-10-CM | POA: Diagnosis not present

## 2019-05-10 DIAGNOSIS — D6959 Other secondary thrombocytopenia: Secondary | ICD-10-CM | POA: Diagnosis not present

## 2019-05-10 DIAGNOSIS — G9349 Other encephalopathy: Secondary | ICD-10-CM | POA: Diagnosis not present

## 2019-05-10 DIAGNOSIS — K824 Cholesterolosis of gallbladder: Secondary | ICD-10-CM | POA: Diagnosis not present

## 2019-05-10 DIAGNOSIS — Z825 Family history of asthma and other chronic lower respiratory diseases: Secondary | ICD-10-CM

## 2019-05-10 DIAGNOSIS — D62 Acute posthemorrhagic anemia: Secondary | ICD-10-CM | POA: Diagnosis not present

## 2019-05-10 DIAGNOSIS — I4819 Other persistent atrial fibrillation: Secondary | ICD-10-CM | POA: Diagnosis not present

## 2019-05-10 DIAGNOSIS — I959 Hypotension, unspecified: Secondary | ICD-10-CM | POA: Diagnosis not present

## 2019-05-10 DIAGNOSIS — I312 Hemopericardium, not elsewhere classified: Secondary | ICD-10-CM | POA: Diagnosis not present

## 2019-05-10 DIAGNOSIS — Z87891 Personal history of nicotine dependence: Secondary | ICD-10-CM

## 2019-05-10 DIAGNOSIS — I371 Nonrheumatic pulmonary valve insufficiency: Secondary | ICD-10-CM | POA: Diagnosis not present

## 2019-05-10 DIAGNOSIS — M2550 Pain in unspecified joint: Secondary | ICD-10-CM | POA: Diagnosis not present

## 2019-05-10 DIAGNOSIS — J9602 Acute respiratory failure with hypercapnia: Secondary | ICD-10-CM | POA: Diagnosis present

## 2019-05-10 DIAGNOSIS — I313 Pericardial effusion (noninflammatory): Secondary | ICD-10-CM | POA: Diagnosis not present

## 2019-05-10 DIAGNOSIS — R262 Difficulty in walking, not elsewhere classified: Secondary | ICD-10-CM | POA: Diagnosis not present

## 2019-05-10 DIAGNOSIS — R778 Other specified abnormalities of plasma proteins: Secondary | ICD-10-CM | POA: Diagnosis not present

## 2019-05-10 DIAGNOSIS — Z452 Encounter for adjustment and management of vascular access device: Secondary | ICD-10-CM

## 2019-05-10 DIAGNOSIS — I230 Hemopericardium as current complication following acute myocardial infarction: Secondary | ICD-10-CM | POA: Diagnosis not present

## 2019-05-10 DIAGNOSIS — Z79899 Other long term (current) drug therapy: Secondary | ICD-10-CM

## 2019-05-10 DIAGNOSIS — R5381 Other malaise: Secondary | ICD-10-CM | POA: Diagnosis not present

## 2019-05-10 DIAGNOSIS — D649 Anemia, unspecified: Secondary | ICD-10-CM | POA: Diagnosis not present

## 2019-05-10 DIAGNOSIS — Z79890 Hormone replacement therapy (postmenopausal): Secondary | ICD-10-CM

## 2019-05-10 DIAGNOSIS — J9589 Other postprocedural complications and disorders of respiratory system, not elsewhere classified: Secondary | ICD-10-CM | POA: Diagnosis not present

## 2019-05-10 DIAGNOSIS — Z23 Encounter for immunization: Secondary | ICD-10-CM

## 2019-05-10 DIAGNOSIS — E782 Mixed hyperlipidemia: Secondary | ICD-10-CM | POA: Diagnosis not present

## 2019-05-10 DIAGNOSIS — I5042 Chronic combined systolic (congestive) and diastolic (congestive) heart failure: Secondary | ICD-10-CM | POA: Diagnosis not present

## 2019-05-10 DIAGNOSIS — I5041 Acute combined systolic (congestive) and diastolic (congestive) heart failure: Secondary | ICD-10-CM | POA: Diagnosis not present

## 2019-05-10 DIAGNOSIS — R319 Hematuria, unspecified: Secondary | ICD-10-CM | POA: Diagnosis not present

## 2019-05-10 DIAGNOSIS — R21 Rash and other nonspecific skin eruption: Secondary | ICD-10-CM | POA: Diagnosis not present

## 2019-05-10 DIAGNOSIS — G9340 Encephalopathy, unspecified: Secondary | ICD-10-CM

## 2019-05-10 HISTORY — DX: Chronic obstructive pulmonary disease, unspecified: J44.9

## 2019-05-10 LAB — COMPREHENSIVE METABOLIC PANEL
ALT: 32 U/L (ref 0–44)
AST: 38 U/L (ref 15–41)
Albumin: 4 g/dL (ref 3.5–5.0)
Alkaline Phosphatase: 65 U/L (ref 38–126)
Anion gap: 11 (ref 5–15)
BUN: 27 mg/dL — ABNORMAL HIGH (ref 8–23)
CO2: 23 mmol/L (ref 22–32)
Calcium: 8.6 mg/dL — ABNORMAL LOW (ref 8.9–10.3)
Chloride: 104 mmol/L (ref 98–111)
Creatinine, Ser: 0.78 mg/dL (ref 0.44–1.00)
GFR calc Af Amer: >60 mL/min (ref >60)
GFR calc non Af Amer: >60 mL/min (ref >60)
Glucose, Bld: 195 mg/dL — ABNORMAL HIGH (ref 70–99)
Potassium: 4.3 mmol/L (ref 3.5–5.1)
Sodium: 138 mmol/L (ref 135–145)
Total Bilirubin: 0.5 mg/dL (ref 0.3–1.2)
Total Protein: 7.3 g/dL (ref 6.5–8.1)

## 2019-05-10 LAB — URINALYSIS, ROUTINE W REFLEX MICROSCOPIC
Bilirubin Urine: NEGATIVE
Glucose, UA: NEGATIVE mg/dL
Ketones, ur: 20 mg/dL — AB
Leukocytes,Ua: NEGATIVE
Nitrite: NEGATIVE
Protein, ur: 100 mg/dL — AB
Specific Gravity, Urine: >1.046 — ABNORMAL HIGH (ref 1.005–1.030)
pH: 5 (ref 5.0–8.0)

## 2019-05-10 LAB — CBC WITH DIFFERENTIAL/PLATELET
Abs Immature Granulocytes: 0.24 10*3/uL — ABNORMAL HIGH (ref 0.00–0.07)
Basophils Absolute: 0.1 10*3/uL (ref 0.0–0.1)
Basophils Relative: 0 %
Eosinophils Absolute: 0.3 10*3/uL (ref 0.0–0.5)
Eosinophils Relative: 2 %
HCT: 46.3 % — ABNORMAL HIGH (ref 36.0–46.0)
Hemoglobin: 13.9 g/dL (ref 12.0–15.0)
Immature Granulocytes: 1 %
Lymphocytes Relative: 33 %
Lymphs Abs: 7.3 10*3/uL — ABNORMAL HIGH (ref 0.7–4.0)
MCH: 29.6 pg (ref 26.0–34.0)
MCHC: 30 g/dL (ref 30.0–36.0)
MCV: 98.7 fL (ref 80.0–100.0)
Monocytes Absolute: 1.7 10*3/uL — ABNORMAL HIGH (ref 0.1–1.0)
Monocytes Relative: 8 %
Neutro Abs: 12.6 10*3/uL — ABNORMAL HIGH (ref 1.7–7.7)
Neutrophils Relative %: 56 %
Platelets: 320 10*3/uL (ref 150–400)
RBC: 4.69 MIL/uL (ref 3.87–5.11)
RDW: 13.7 % (ref 11.5–15.5)
WBC: 22.2 10*3/uL — ABNORMAL HIGH (ref 4.0–10.5)
nRBC: 0 % (ref 0.0–0.2)

## 2019-05-10 LAB — TROPONIN I (HIGH SENSITIVITY)
Troponin I (High Sensitivity): 44 ng/L — ABNORMAL HIGH (ref <18)
Troponin I (High Sensitivity): 499 ng/L (ref <18)
Troponin I (High Sensitivity): 975 ng/L (ref <18)

## 2019-05-10 LAB — HEPARIN LEVEL (UNFRACTIONATED): Heparin Unfractionated: <0.1 IU/mL — ABNORMAL LOW (ref 0.30–0.70)

## 2019-05-10 LAB — SARS CORONAVIRUS 2 BY RT PCR (HOSPITAL ORDER, PERFORMED IN ~~LOC~~ HOSPITAL LAB): SARS Coronavirus 2: NEGATIVE

## 2019-05-10 LAB — LACTIC ACID, PLASMA
Lactic Acid, Venous: 1.7 mmol/L (ref 0.5–1.9)
Lactic Acid, Venous: 1.9 mmol/L (ref 0.5–1.9)

## 2019-05-10 LAB — BRAIN NATRIURETIC PEPTIDE: B Natriuretic Peptide: 211 pg/mL — ABNORMAL HIGH (ref 0.0–100.0)

## 2019-05-10 LAB — APTT: aPTT: 28 seconds (ref 24–36)

## 2019-05-10 MED ORDER — ASPIRIN 81 MG PO CHEW
81.0000 mg | CHEWABLE_TABLET | Freq: Every day | ORAL | Status: DC
  Administered 2019-05-11 – 2019-05-14 (×4): 81 mg via ORAL
  Filled 2019-05-10 (×5): qty 1

## 2019-05-10 MED ORDER — BENAZEPRIL HCL 20 MG PO TABS
20.0000 mg | ORAL_TABLET | Freq: Every day | ORAL | Status: DC
  Administered 2019-05-11 – 2019-05-14 (×4): 20 mg via ORAL
  Filled 2019-05-10 (×7): qty 1

## 2019-05-10 MED ORDER — EPINEPHRINE 0.3 MG/0.3ML IJ SOAJ
0.3000 mg | Freq: Once | INTRAMUSCULAR | Status: AC
  Administered 2019-05-10: 0.3 mg via INTRAMUSCULAR

## 2019-05-10 MED ORDER — ALBUTEROL SULFATE HFA 108 (90 BASE) MCG/ACT IN AERS
2.0000 | INHALATION_SPRAY | Freq: Four times a day (QID) | RESPIRATORY_TRACT | Status: DC | PRN
  Filled 2019-05-10: qty 6.7

## 2019-05-10 MED ORDER — HEPARIN (PORCINE) 25000 UT/250ML-% IV SOLN
700.0000 [IU]/h | INTRAVENOUS | Status: DC
  Administered 2019-05-10: 22:00:00 650 [IU]/h via INTRAVENOUS
  Administered 2019-05-12 – 2019-05-13 (×2): 800 [IU]/h via INTRAVENOUS
  Filled 2019-05-10 (×3): qty 250

## 2019-05-10 MED ORDER — IOHEXOL 350 MG/ML SOLN
75.0000 mL | Freq: Once | INTRAVENOUS | Status: AC | PRN
  Administered 2019-05-10: 17:00:00 75 mL via INTRAVENOUS

## 2019-05-10 MED ORDER — ONDANSETRON HCL 4 MG PO TABS
4.0000 mg | ORAL_TABLET | Freq: Four times a day (QID) | ORAL | Status: DC | PRN
  Administered 2019-05-19 – 2019-05-28 (×2): 4 mg via ORAL
  Filled 2019-05-10 (×2): qty 1

## 2019-05-10 MED ORDER — ENOXAPARIN SODIUM 40 MG/0.4ML ~~LOC~~ SOLN: 40.0000 mg | SUBCUTANEOUS | Status: DC

## 2019-05-10 MED ORDER — HEPARIN BOLUS VIA INFUSION
3300.0000 [IU] | Freq: Once | INTRAVENOUS | Status: AC
  Administered 2019-05-10: 3300 [IU] via INTRAVENOUS

## 2019-05-10 MED ORDER — ONDANSETRON HCL 4 MG/2ML IJ SOLN
4.0000 mg | Freq: Four times a day (QID) | INTRAMUSCULAR | Status: DC | PRN
  Administered 2019-05-11 – 2019-05-24 (×6): 4 mg via INTRAVENOUS
  Filled 2019-05-10 (×6): qty 2

## 2019-05-10 MED ORDER — MOMETASONE FURO-FORMOTEROL FUM 200-5 MCG/ACT IN AERO
2.0000 | INHALATION_SPRAY | Freq: Two times a day (BID) | RESPIRATORY_TRACT | Status: DC
  Administered 2019-05-11 – 2019-05-14 (×6): 2 via RESPIRATORY_TRACT
  Filled 2019-05-10: qty 8.8

## 2019-05-10 MED ORDER — EPINEPHRINE 0.3 MG/0.3ML IJ SOAJ
INTRAMUSCULAR | Status: AC
  Filled 2019-05-10: qty 0.3

## 2019-05-10 MED ORDER — IBUPROFEN 200 MG PO TABS
400.0000 mg | ORAL_TABLET | Freq: Four times a day (QID) | ORAL | Status: DC | PRN
  Administered 2019-05-11: 400 mg via ORAL
  Filled 2019-05-10: qty 2

## 2019-05-10 MED ORDER — AMOXICILLIN-POT CLAVULANATE 875-125 MG PO TABS: 1.0000 | ORAL_TABLET | Freq: Two times a day (BID) | ORAL | 0 refills | Status: DC

## 2019-05-10 MED ORDER — SODIUM CHLORIDE 0.9 % IV SOLN
1.0000 g | Freq: Once | INTRAVENOUS | Status: AC
  Administered 2019-05-10: 1 g via INTRAVENOUS
  Filled 2019-05-10: qty 10

## 2019-05-10 MED ORDER — ONDANSETRON HCL 4 MG/2ML IJ SOLN
4.0000 mg | Freq: Once | INTRAMUSCULAR | Status: AC
  Administered 2019-05-10: 16:00:00 4 mg via INTRAVENOUS
  Filled 2019-05-10: qty 2

## 2019-05-10 MED ORDER — ALBUTEROL SULFATE (2.5 MG/3ML) 0.083% IN NEBU
2.5000 mg | INHALATION_SOLUTION | RESPIRATORY_TRACT | Status: DC | PRN
  Administered 2019-05-11: 20:00:00 2.5 mg via RESPIRATORY_TRACT
  Filled 2019-05-10: qty 3

## 2019-05-10 MED ORDER — METHYLPREDNISOLONE SODIUM SUCC 125 MG IJ SOLR
60.0000 mg | Freq: Two times a day (BID) | INTRAMUSCULAR | Status: DC
  Administered 2019-05-10 – 2019-05-14 (×8): 60 mg via INTRAVENOUS
  Filled 2019-05-10 (×8): qty 2

## 2019-05-10 MED ORDER — POLYETHYLENE GLYCOL 3350 17 G PO PACK
17.0000 g | PACK | Freq: Every day | ORAL | Status: DC
  Administered 2019-05-12 – 2019-05-31 (×10): 17 g via ORAL
  Filled 2019-05-10 (×17): qty 1

## 2019-05-10 MED ORDER — ALBUTEROL SULFATE (2.5 MG/3ML) 0.083% IN NEBU
5.0000 mg | INHALATION_SOLUTION | Freq: Once | RESPIRATORY_TRACT | Status: AC
  Administered 2019-05-10: 16:00:00 5 mg via RESPIRATORY_TRACT
  Filled 2019-05-10: qty 6

## 2019-05-10 MED ORDER — LORATADINE 10 MG PO TABS
10.0000 mg | ORAL_TABLET | Freq: Every day | ORAL | Status: DC
  Administered 2019-05-12 – 2019-05-31 (×19): 10 mg via ORAL
  Filled 2019-05-10 (×20): qty 1

## 2019-05-10 MED ORDER — LEVOTHYROXINE SODIUM 75 MCG PO TABS
75.0000 ug | ORAL_TABLET | Freq: Every day | ORAL | Status: DC
  Administered 2019-05-11 – 2019-05-31 (×19): 75 ug via ORAL
  Filled 2019-05-10 (×20): qty 1

## 2019-05-10 NOTE — ED Notes (Signed)
Pt placed on bedpan, pt void and had BM. Pt repositioned

## 2019-05-10 NOTE — ED Notes (Signed)
Amy Chambers- daughter- [phone]

## 2019-05-10 NOTE — H&P (Signed)
History and Physical  Amy Chambers XQJ:[id number] DOB: [birth year] DOA: 05/10/2019  Referring physician: Dr Roderic Palau, ED physician PCP: Chevis Pretty, [place]  Outpatient Specialists:   Patient Coming From: home  Chief Complaint: shortness of breath  HPI: Amy Chambers is a 85 y.o. female with a history of COPD, GERD, hypertension, osteopenia, thyroid disease.  Patient seen due to acute onset of shortness of breath with hypoxia.  Patient had been prescribed Augmentin due to right ear pain after a tele-visit with her PCP.  She took a dose this morning and shortly afterwards began to develop shortness of breath and hypoxia.  EMS was called and found her oxygen saturation in 60s.  She was given steroids, epinephrine and transported to the hospital.  She received more epinephrine here and her breathing began to improve.  No other palliating or provoking factors.  Emergency Department Course: CTA of the chest normal.  CT of the neck shows no abnormalities.  Chest x-ray normal.  White count slightly elevated initial troponin 40 with a repeat of 500.  Lactic acid normal.  Review of Systems:   Pt denies any fevers, chills, nausea, vomiting, diarrhea, constipation, abdominal pain, orthopnea, cough, wheezing, palpitations, headache, vision changes, lightheadedness, dizziness, melena, rectal bleeding.  Review of systems are otherwise negative  Past Medical History:  Diagnosis Date   Cataract    COPD (chronic obstructive pulmonary disease) (HCC)    GERD (gastroesophageal reflux disease)    Hypertension    Osteopenia    Thyroid disease    Past Surgical History:  Procedure Laterality Date   CATARACT EXTRACTION, BILATERAL     Social History:  reports that she quit smoking about 24 years ago. She quit smokeless tobacco use about 20 years ago.  Her smokeless tobacco use included chew. She reports that she does not drink alcohol or use drugs. Patient lives at home  Allergies  Allergen  Reactions   Crestor [Rosuvastatin Calcium] Other (See Comments)    Cramping/pain in lower extremeties   Niaspan [Niacin Er] Other (See Comments)    Cramping/pain in lower extremeties    Family History  Problem Relation Age of Onset   Diabetes Mother    Cancer Brother    Tuberculosis Father    Early death Sister    Early death Brother    COPD Brother    Emphysema Brother    Early death Sister    COPD Sister    Emphysema Sister    Aneurysm Sister    Stroke Sister    Cancer Sister    Hypertension Son    Hypertension Daughter    Fibromyalgia Daughter    Hypertension Daughter       Prior to Admission medications   Medication Sig Start Date End Date Taking? Authorizing Provider  albuterol (VENTOLIN HFA) 108 (90 Base) MCG/ACT inhaler INHALE 2 PUFFS INTO LUNGS EVERY 6 HOURS AS NEEDED FOR WHEEZING Patient taking differently: Inhale 2 puffs into the lungs every 6 (six) hours as needed for wheezing or shortness of breath.  12/04/18  Yes Hassell Done, [name], [name]  amoxicillin-clavulanate (AUGMENTIN) 875-125 MG tablet Take 1 tablet by mouth 2 (two) times daily. 05/10/19  Yes Hassell Done, [name], [name]  aspirin 81 MG tablet Take 81 mg by mouth daily.   Yes [provider]  benazepril (LOTENSIN) 20 MG tablet Take 1 tablet (20 mg total) by mouth daily. 03/04/19  Yes [name], [name], [name]  budesonide-formoterol (SYMBICORT) 160-4.5 MCG/ACT inhaler Inhale 2 puffs into the lungs 2 (two)  times daily. 03/04/19  Yes [name], [name], [name]  calcium-vitamin D 250-100 MG-UNIT per tablet Take 1 tablet by mouth 2 (two) times daily.    Yes [provider]  fluticasone (FLONASE) 50 MCG/ACT nasal spray Place 2 sprays into both nostrils daily. 05/09/19  Yes [name], [name], [name]  levothyroxine (SYNTHROID) 75 MCG tablet Take 1 tablet (75 mcg total) by mouth daily. 03/04/19  Yes [name], [name], [name]  polyethylene glycol (MIRALAX / GLYCOLAX) packet Take 17 g by  mouth daily.   Yes [provider]  VITAMIN D PO Take 1 capsule by mouth daily.   Yes [provider]    Physical Exam: BP (!) 101/56    Pulse 94    Resp (!) 22    Ht  (1.499 m)    Wt 57.2 kg    SpO2 98%    BMI 25.47 kg/m    General: Elderly female. Awake and alert and oriented x3. No acute cardiopulmonary distress.   HEENT: Normocephalic atraumatic.  Right and left ears normal in appearance.  Pupils equal, round, reactive to light. Extraocular muscles are intact. Sclerae anicteric and noninjected.  Moist mucosal membranes. No mucosal lesions.   Neck: Neck supple without lymphadenopathy. No carotid bruits. No masses palpated.   Cardiovascular: Regular rate with normal S1-S2 sounds. No murmurs, rubs, gallops auscultated. No JVD.   Respiratory: Good respiratory effort with no wheezes, rales, rhonchi. Lungs clear to auscultation bilaterally.  Has some accessory muscle use.  Abdomen: Soft, nontender, nondistended. Active bowel sounds. No masses or hepatosplenomegaly   Skin: No rashes, lesions, or ulcerations.  Dry, warm to touch. 2+ dorsalis pedis and radial pulses.  Musculoskeletal: No calf or leg pain. All major joints not erythematous nontender.  No upper or lower joint deformation.  Good ROM.  No contractures   Psychiatric: Intact judgment and insight. Pleasant and cooperative.  Neurologic: No focal neurological deficits. Strength is 5/5 and symmetric in upper and lower extremities.  Cranial nerves II through XII are grossly intact.           Labs on Admission: I have personally reviewed following labs and imaging studies  CBC: Recent Labs  Lab 05/10/19 1303  WBC 22.2*  NEUTROABS 12.6*  HGB 13.9  HCT 46.3*  MCV 98.7  PLT 320   Basic Metabolic Panel: Recent Labs  Lab 05/10/19 1303  NA 138  K 4.3  CL 104  CO2 23  GLUCOSE 195*  BUN 27*  CREATININE 0.78  CALCIUM 8.6*   GFR: Estimated Creatinine Clearance: 36.7 mL/min (by C-G formula based  on SCr of 0.78 mg/dL). Liver Function Tests: Recent Labs  Lab 05/10/19 1303  AST 38  ALT 32  ALKPHOS 65  BILITOT 0.5  PROT 7.3  ALBUMIN 4.0   No results for input(s): LIPASE, AMYLASE in the last 168 hours. No results for input(s): AMMONIA in the last 168 hours. Coagulation Profile: No results for input(s): INR, PROTIME in the last 168 hours. Cardiac Enzymes: No results for input(s): CKTOTAL, CKMB, CKMBINDEX, TROPONINI in the last 168 hours. BNP (last 3 results) No results for input(s): PROBNP in the last 8760 hours. HbA1C: No results for input(s): HGBA1C in the last 72 hours. CBG: No results for input(s): GLUCAP in the last 168 hours. Lipid Profile: No results for input(s): CHOL, HDL, LDLCALC, TRIG, CHOLHDL, LDLDIRECT in the last 72 hours. Thyroid Function Tests: No results for input(s): TSH, T4TOTAL, FREET4, T3FREE, THYROIDAB in the last 72 hours. Anemia Panel: No results for  input(s): VITAMINB12, FOLATE, FERRITIN, TIBC, IRON, RETICCTPCT in the last 72 hours. Urine analysis:    Component Value Date/Time   COLORURINE YELLOW 05/10/2019 1741   APPEARANCEUR CLEAR 05/10/2019 1741   APPEARANCEUR Clear 02/22/2019 1546   LABSPEC >1.046 (H) 05/10/2019 1741   PHURINE 5.0 05/10/2019 1741   GLUCOSEU NEGATIVE 05/10/2019 1741   HGBUR SMALL (A) 05/10/2019 1741   BILIRUBINUR NEGATIVE 05/10/2019 1741   BILIRUBINUR Negative 02/22/2019 1546   KETONESUR 20 (A) 05/10/2019 1741   PROTEINUR 100 (A) 05/10/2019 1741   UROBILINOGEN negative 01/05/2015 1225   NITRITE NEGATIVE 05/10/2019 1741   LEUKOCYTESUR NEGATIVE 05/10/2019 1741   Sepsis Labs: @LABRCNTIP (procalcitonin:4,lacticidven:4) ) Recent Results (from the past 240 hour(s))  SARS Coronavirus 2 [facility](Hospital order, Performed in [facility] hospital lab) Nasopharyngeal Nasopharyngeal Swab     Status: None   Collection Time: 05/10/19  1:04 PM   Specimen: Nasopharyngeal Swab  Result Value Ref Range Status   SARS Coronavirus 2 NEGATIVE  NEGATIVE Final    Comment: (NOTE) If result is NEGATIVE SARS-CoV-2 target nucleic acids are NOT DETECTED. The SARS-CoV-2 RNA is generally detectable in upper and lower  respiratory specimens during the acute phase of infection. The lowest  concentration of SARS-CoV-2 viral copies this assay can detect is 250  copies / mL. A negative result does not preclude SARS-CoV-2 infection  and should not be used as the sole basis for treatment or other  patient management decisions.  A negative result may occur with  improper specimen collection / handling, submission of specimen other  than nasopharyngeal swab, presence of viral mutation(s) within the  areas targeted by this assay, and inadequate number of viral copies  (<250 copies / mL). A negative result must be combined with clinical  observations, patient history, and epidemiological information. If result is POSITIVE SARS-CoV-2 target nucleic acids are DETECTED. The SARS-CoV-2 RNA is generally detectable in upper and lower  respiratory specimens dur ing the acute phase of infection.  Positive  results are indicative of active infection with SARS-CoV-2.  Clinical  correlation with patient history and other diagnostic information is  necessary to determine patient infection status.  Positive results do  not rule out bacterial infection or co-infection with other viruses. If result is PRESUMPTIVE POSTIVE SARS-CoV-2 nucleic acids MAY BE PRESENT.   A presumptive positive result was obtained on the submitted specimen  and confirmed on repeat testing.  While 2019 novel coronavirus  (SARS-CoV-2) nucleic acids may be present in the submitted sample  additional confirmatory testing may be necessary for epidemiological  and / or clinical management purposes  to differentiate between  SARS-CoV-2 and other Sarbecovirus currently known to infect humans.  If clinically indicated additional testing with an alternate test  methodology [phone]([id number]) is  advised. The SARS-CoV-2 RNA is generally  detectable in upper and lower respiratory sp ecimens during the acute  phase of infection. The expected result is Negative. Fact Sheet for Patients:  BoilerBrush.com.cyhttps://www.fda.gov/media/136312/download Fact Sheet for Healthcare Providers: https://pope.com/https://www.fda.gov/media/136313/download This test is not yet approved or cleared by the [place] FDA and has been authorized for detection and/or diagnosis of SARS-CoV-2 by FDA under an Emergency Use Authorization (EUA).  This EUA will remain in effect (meaning this test can be used) for the duration of the COVID-19 declaration under Section 564(b)(1) of the Act, 21 U.S.C. section 360bbb-3(b)(1), unless the authorization is terminated or revoked sooner. Performed at [facility], [address] [place], [place] [address]   Blood culture (routine x 2)  Status: None (Preliminary result)   Collection Time: 05/10/19  3:34 PM   Specimen: Right Antecubital; Blood  Result Value Ref Range Status   Specimen Description RIGHT ANTECUBITAL  Final   Special Requests   Final    BOTTLES DRAWN AEROBIC AND ANAEROBIC Blood Culture adequate volume Performed at [facility], [address], [place], [place] [address]    Culture PENDING  Incomplete   Report Status PENDING  Incomplete  Blood culture (routine x 2)     Status: None (Preliminary result)   Collection Time: 05/10/19  3:41 PM   Specimen: BLOOD RIGHT HAND  Result Value Ref Range Status   Specimen Description BLOOD RIGHT HAND  Final   Special Requests   Final    BOTTLES DRAWN AEROBIC ONLY Blood Culture results may not be optimal due to an inadequate volume of blood received in culture bottles Performed at [facility], [address] [place], [place] [address]    Culture PENDING  Incomplete   Report Status PENDING  Incomplete     Radiological Exams on Admission: Ct Soft Tissue Neck Wo Contrast  Result Date: 05/10/2019 CLINICAL DATA:  Arterial  stricture/occlusion head neck. COPD with short of breath and respiratory distress. EXAM: CT NECK WITHOUT CONTRAST TECHNIQUE: Multidetector CT imaging of the neck was performed following the standard protocol without intravenous contrast. COMPARISON:  None. FINDINGS: Pharynx and larynx: Normal. No mass or swelling. Normal airway. No pharyngeal edema. Salivary glands: No inflammation, mass, or stone. Thyroid: Negative Lymph nodes: No pathologic lymph nodes in the neck. Vascular: Limited vascular evaluation without intravenous contrast. Arterial stricture or stenosis not evaluated on the study. There is atherosclerotic calcification in the carotid bulb bilaterally. Atherosclerotic calcification also in the cavernous carotid bilaterally. Limited intracranial: Negative Visualized orbits: Negative Mastoids and visualized paranasal sinuses: Negative Skeleton: Cervical spondylosis.  No acute skeletal abnormality. Upper chest: Mild apical emphysema. No acute abnormality in the lung apices. Other: None IMPRESSION: No acute abnormality Atherosclerotic calcification of the carotid arteries bilaterally. Arterial stricture occlusion not excluded on this study without intravenous contrast Airway intact. Electronically Signed   By: Marlan Palau M.D.   On: 05/10/2019 17:13   Ct Angio Chest Pe W And/or Wo Contrast  Result Date: 05/10/2019 CLINICAL DATA:  COPD, hypertension, respiratory distress, high pretest probability for PE EXAM: CT ANGIOGRAPHY CHEST WITH CONTRAST TECHNIQUE: Multidetector CT imaging of the chest was performed using the standard protocol during bolus administration of intravenous contrast. Multiplanar CT image reconstructions and MIPs were obtained to evaluate the vascular anatomy. CONTRAST:  59mL OMNIPAQUE IOHEXOL 350 MG/ML SOLN COMPARISON:  None. FINDINGS: Cardiovascular: Heart size upper limits normal. Some reflux of contrast from the right atrium into hepatic veins. The RV is nondilated. Satisfactory  opacification of pulmonary arteries noted, and there is no evidence of pulmonary emboli. Moderate coronary calcifications. Fair contrast opacification of the thoracic aorta with no suggestion of dissection, aneurysm, or stenosis. There is classic 3-vessel brachiocephalic arch anatomy without proximal stenosis. Calcified plaque in the arch and descending thoracic segment. Visualized proximal abdominal aorta is atheromatous without aneurysm. Mediastinum/Nodes:  no hilar or mediastinal adenopathy. Lungs/Pleura: No pleural effusion. No pneumothorax. Pulmonary emphysema. Upper Abdomen: 2.9 cm probable cyst in hepatic segment 4A. Smaller low-attenuation lesions in the right hepatic lobe are incompletely visualized and characterized. No acute findings. Musculoskeletal: Anterior vertebral endplate spurring at multiple levels in the mid and lower thoracic spine. No fracture or worrisome bone lesion. Review of the MIP images confirms the above findings. IMPRESSION:  1. Negative for acute PE or thoracic aortic dissection. 2. Coronary and Aortic Atherosclerosis (ICD10-170.0). Electronically Signed   By: Corlis Leak M.D.   On: 05/10/2019 17:07   Dg Chest Portable 1 View  Result Date: 05/10/2019 CLINICAL DATA:  Pt brought in by [facility] with c/o SOB that started suddenly around noon. EMS reports no air movement, O2 sat 61% on RA, diaphoretic. EMS reports pt has become less lethargic. EMS gave 2 Albuterol neb, Solumedrol, Epi 0.15 subq. O2 sat increased to 98% with NRB but no change in mental status and pt denies SOB getting better. EMS reports pt took 1st dose of Amoxicillin at 1030 for ear pain that pt has been having for 2 days. Pt has taken PCN before with no problems.HISTORY OF HTN, COPD EXAM: PORTABLE CHEST 1 VIEW COMPARISON:  03/04/2019 and older exams. FINDINGS: Cardiac silhouette is top-normal in size. No mediastinal hilar masses. Prominent bronchovascular markings, stable. Lungs hyperexpanded but otherwise clear with no  evidence of pneumonia or edema. No convincing pleural effusion and no pneumothorax. Skeletal structures are demineralized but grossly intact. IMPRESSION: 1. No acute cardiopulmonary disease. Electronically Signed   By: Amie Portland M.D.   On: 05/10/2019 13:38    EKG: Independently reviewed.  Sinus arrhythmia with left anterior fascicular block.  No acute ST changes.  Assessment/Plan: Principal Problem:   Acute respiratory failure with hypoxia and hypercapnia (HCC) Active Problems:   HTN (hypertension)   Hyperlipemia   Hypothyroidism   GERD (gastroesophageal reflux disease)   Elevated troponin    This patient was discussed with the ED physician, including pertinent vitals, physical exam findings, labs, and imaging.  We also discussed care given by the ED provider.  1. Acute respiratory failure with hypoxia a. Question secondary to acute allergic reaction from antibiotic b. We will give antihistamine c. We will give second dose of steroid tonight d. Observation on telemetry e. Nebulizer treatments PRN f. Repeat CBC in the morning 2. Elevated troponin a. We will repeat troponin tonight 3. Hypertension a. Continue antihypertensives 4. Hyperlipidemia a. Continue home medicine 5. Hypothyroidism a. Continue home regimen 6. GERD  DVT prophylaxis: Lovenox Consultants: None Code Status: DNR confirmed with the patient Family Communication: Daughter present Disposition Plan: Should be able to return home following in the morning   Levie Heritage, DO

## 2019-05-10 NOTE — Telephone Encounter (Signed)
Spoke with daughter regarding pt's allergies Questions answered

## 2019-05-10 NOTE — Telephone Encounter (Signed)
Daughter aware.

## 2019-05-10 NOTE — ED Triage Notes (Addendum)
Pt brought in by [facility] with c/o SOB that started suddenly around noon. EMS reports no air movement, O2 sat 61% on RA, diaphoretic. EMS reports pt has become less lethargic. EMS gave 2 Albuterol neb, Solumedrol, Epi 0.15 subq. O2 sat increased to 98% with NRB but no change in mental status and pt denies SOB getting better. EMS reports pt took 1st dose of Amoxicillin at 1030 for ear pain that pt has been having for 2 days. Pt has taken PCN before with no problems.

## 2019-05-10 NOTE — Progress Notes (Signed)
ANTICOAGULATION CONSULT NOTE - Initial Consult  Pharmacy Consult for heparin dosing Indication: ACS/STEMI  Allergies  Allergen Reactions  . Crestor [Rosuvastatin Calcium] Other (See Comments)    Cramping/pain in lower extremeties  . Niaspan [Niacin Er] Other (See Comments)    Cramping/pain in lower extremeties    Patient Measurements: Height: 4\' 11"  (149.9 cm) Weight: 126 lb 1.7 oz (57.2 kg) IBW/kg (Calculated) : 43.2 Heparin Dosing Weight:  HEPARIN DW (KG): 55  Vital Signs: BP: 125/57 (09/25 1930) Pulse Rate: 92 (09/25 1915)  Labs: Recent Labs    05/10/19 1303 05/10/19 1527 05/10/19 1906  HGB 13.9  --   --   HCT 46.3*  --   --   PLT 320  --   --   CREATININE 0.78  --   --   TROPONINIHS 44* 499* 975*    Estimated Creatinine Clearance: 36.7 mL/min (by C-G formula based on SCr of 0.78 mg/dL).   Medical History: Past Medical History:  Diagnosis Date  . Cataract   . COPD (chronic obstructive pulmonary disease) ([place])   . GERD (gastroesophageal reflux disease)   . Hypertension   . Osteopenia   . Thyroid disease       Assessment: Pharmacy consulted to dose heparin infusion for this 85 yo female with elevated high-sensitivity Troponins.   Patient wasn't on any anti-coagulation prior to admission.  Baseline CBC is WNL.   Goal of Therapy:  Heparin level 0.3-0.7 units/ml Monitor platelets by anticoagulation protocol: Yes   Plan:  Give 3300 units bolus x 1 Start heparin infusion at 660 units/hr Check anti-Xa level in 6-8 hours and daily while on heparin Continue to monitor H&H and platelets  Amy Chambers 05/10/2019,9:21 PM

## 2019-05-10 NOTE — Telephone Encounter (Signed)
augmentin rx sent to pharmacy 

## 2019-05-10 NOTE — ED Notes (Signed)
Patient on bedpan

## 2019-05-10 NOTE — Progress Notes (Signed)
Patient ID: Amy Chambers, female   DOB: 09/25/1927, 85 y.o.   MRN: [id number]  Rpt troponin >900. Probable NSTEMI. EKG - flattened T waves in V4, V5, otherwise minimal change. Will start heparin and transfer to [facility]. Discussed with cardiology, who will consult in AM.  Orders for echo in AM.  Patient and her daughter updated regarding results and recommendations.  Truett Mainland, DO 05/10/2019 9:02 PM

## 2019-05-10 NOTE — ED Notes (Signed)
Date and time results received: 05/10/19 1640 (use smartphrase ".now" to insert current time)  Test: troponin Critical Value: 499  Name of Provider Notified: Dr. Roderic Palau  Orders Received? Or Actions Taken?: n/a

## 2019-05-10 NOTE — ED Notes (Addendum)
Date and time results received: 05/10/19 8:52 PM   (use smartphrase ".now" to insert current time)  Test: trop Critical Value: 975  Name of Provider Notified: stinson  Orders Received? Or Actions Taken?:  See orders

## 2019-05-10 NOTE — ED Provider Notes (Signed)
[facility] EMERGENCY DEPARTMENT Provider Note   CSN: [id number] Arrival date & time: 05/10/19  1246     History   Chief Complaint Chief Complaint  Patient presents with  . Shortness of Breath    HPI Amy Chambers is a 85 y.o. female.     Patient presents with history of COPD, reflux, high blood pressure presents with respiratory distress.  Patient was seen the day prior and given Augmentin for possible ear infection.  No details known of specific respiratory symptoms earlier today.  Unable to get details from patient due to presentation including confusion breathing difficulty. Clarified with family and pt is DNR.       Past Medical History:  Diagnosis Date  . Cataract   . COPD (chronic obstructive pulmonary disease) (HCC)   . GERD (gastroesophageal reflux disease)   . Hypertension   . Osteopenia   . Thyroid disease     Patient Active Problem List   Diagnosis Date Noted  . Simple chronic bronchitis (HCC) 05/24/2018  . HTN (hypertension) 12/01/2010  . Hyperlipemia 12/01/2010  . Hypothyroidism 12/01/2010  . Osteopenia 12/01/2010  . GERD (gastroesophageal reflux disease) 12/01/2010    Past Surgical History:  Procedure Laterality Date  . CATARACT EXTRACTION, BILATERAL       OB History   No obstetric history on file.      Home Medications    Prior to Admission medications   Medication Sig Start Date End Date Taking? Authorizing Provider  albuterol (VENTOLIN HFA) 108 (90 Base) MCG/ACT inhaler INHALE 2 PUFFS INTO LUNGS EVERY 6 HOURS AS NEEDED FOR WHEEZING Patient taking differently: Inhale 2 puffs into the lungs every 6 (six) hours as needed for wheezing or shortness of breath.  12/04/18  Yes [name], [name], [name]  amoxicillin-clavulanate (AUGMENTIN) 875-125 MG tablet Take 1 tablet by mouth 2 (two) times daily. 05/10/19  Yes [name], [name], [name]  aspirin 81 MG tablet Take 81 mg by mouth daily.   Yes [provider]  benazepril (LOTENSIN) 20  MG tablet Take 1 tablet (20 mg total) by mouth daily. 03/04/19  Yes [name], [name], [name]  budesonide-formoterol (SYMBICORT) 160-4.5 MCG/ACT inhaler Inhale 2 puffs into the lungs 2 (two) times daily. 03/04/19  Yes [name], [name], [name]  calcium-vitamin D 250-100 MG-UNIT per tablet Take 1 tablet by mouth 2 (two) times daily.    Yes [provider]  fluticasone (FLONASE) 50 MCG/ACT nasal spray Place 2 sprays into both nostrils daily. 05/09/19  Yes [name], [name], [name]  levothyroxine (SYNTHROID) 75 MCG tablet Take 1 tablet (75 mcg total) by mouth daily. 03/04/19  Yes [name], [name], [name]  polyethylene glycol (MIRALAX / GLYCOLAX) packet Take 17 g by mouth daily.   Yes [provider]  VITAMIN D PO Take 1 capsule by mouth daily.   Yes [provider]    Family History Family History  Problem Relation Age of Onset  . Diabetes Mother   . Cancer Brother   . Tuberculosis Father   . Early death Sister   . Early death Brother   . COPD Brother   . Emphysema Brother   . Early death Sister   . COPD Sister   . Emphysema Sister   . Aneurysm Sister   . Stroke Sister   . Cancer Sister   . Hypertension Son   . Hypertension Daughter   . Fibromyalgia Daughter   . Hypertension Daughter     Social History Social History   Tobacco Use  . Smoking  status: Former Smoker    Quit date: 08/15/1994    Years since quitting: 24.7  . Smokeless tobacco: Former Neurosurgeon    Types: Chew    Quit date: 08/15/1998  Substance Use Topics  . Alcohol use: No  . Drug use: No     Allergies   Crestor [rosuvastatin calcium] and Niaspan [niacin er]   Review of Systems Review of Systems  Unable to perform ROS: Mental status change     Physical Exam Updated Vital Signs BP (!) 166/106   Pulse 93   Resp 20   Ht 4\' 11"  (1.499 m)   Wt 57.2 kg   SpO2 98%   BMI 25.47 kg/m   Physical Exam Vitals signs and nursing note reviewed.  Constitutional:      Appearance: She is  well-developed.  HENT:     Head: Normocephalic and atraumatic.     Comments: No angioedema Eyes:     General:        Right eye: No discharge.        Left eye: No discharge.     Conjunctiva/sclera: Conjunctivae normal.  Neck:     Musculoskeletal: Normal range of motion and neck supple.     Trachea: No tracheal deviation.  Cardiovascular:     Rate and Rhythm: Normal rate and regular rhythm.  Pulmonary:     Effort: Accessory muscle usage and respiratory distress present.     Breath sounds: Examination of the right-middle field reveals decreased breath sounds. Examination of the left-middle field reveals decreased breath sounds. Examination of the right-lower field reveals decreased breath sounds. Examination of the left-lower field reveals decreased breath sounds. Decreased breath sounds and wheezing present.  Abdominal:     General: There is no distension.     Palpations: Abdomen is soft.     Tenderness: There is no abdominal tenderness. There is no guarding.  Skin:    General: Skin is warm.     Findings: Rash present.     Comments: Erythema posterior and medial thighs and Knees   Neurological:     Mental Status: She is alert. She is disoriented.     GCS: GCS eye subscore is 4. GCS verbal subscore is 3. GCS motor subscore is 5.     Comments: Minimal verbal discernible.  Patient generally confused not following commands.  Pupils equal bilateral.  Moving all extremities with general weakness equal bilateral.      ED Treatments / Results  Labs (all labs ordered are listed, but only abnormal results are displayed) Labs Reviewed  COMPREHENSIVE METABOLIC PANEL - Abnormal; Notable for the following components:      Result Value   Glucose, Bld 195 (*)    BUN 27 (*)    Calcium 8.6 (*)    All other components within normal limits  CBC WITH DIFFERENTIAL/PLATELET - Abnormal; Notable for the following components:   WBC 22.2 (*)    HCT 46.3 (*)    Neutro Abs 12.6 (*)    Lymphs Abs 7.3  (*)    Monocytes Absolute 1.7 (*)    Abs Immature Granulocytes 0.24 (*)    All other components within normal limits  BRAIN NATRIURETIC PEPTIDE - Abnormal; Notable for the following components:   B Natriuretic Peptide 211.0 (*)    All other components within normal limits  TROPONIN I (HIGH SENSITIVITY) - Abnormal; Notable for the following components:   Troponin I (High Sensitivity) 44 (*)    All other components within normal limits  SARS CORONAVIRUS 2 (HOSPITAL ORDER, PERFORMED IN [facility])  CULTURE, BLOOD (ROUTINE X 2)  CULTURE, BLOOD (ROUTINE X 2)  PATHOLOGIST SMEAR REVIEW  LACTIC ACID, PLASMA  LACTIC ACID, PLASMA  TROPONIN I (HIGH SENSITIVITY)    EKG None  Radiology Dg Chest Portable 1 View  Result Date: 05/10/2019 CLINICAL DATA:  Pt brought in by [facility] with c/o SOB that started suddenly around noon. EMS reports no air movement, O2 sat 61% on RA, diaphoretic. EMS reports pt has become less lethargic. EMS gave 2 Albuterol neb, Solumedrol, Epi 0.15 subq. O2 sat increased to 98% with NRB but no change in mental status and pt denies SOB getting better. EMS reports pt took 1st dose of Amoxicillin at 1030 for ear pain that pt has been having for 2 days. Pt has taken PCN before with no problems.HISTORY OF HTN, COPD EXAM: PORTABLE CHEST 1 VIEW COMPARISON:  03/04/2019 and older exams. FINDINGS: Cardiac silhouette is top-normal in size. No mediastinal hilar masses. Prominent bronchovascular markings, stable. Lungs hyperexpanded but otherwise clear with no evidence of pneumonia or edema. No convincing pleural effusion and no pneumothorax. Skeletal structures are demineralized but grossly intact. IMPRESSION: 1. No acute cardiopulmonary disease. Electronically Signed   By: Amie Portlandavid  Ormond M.D.   On: 05/10/2019 13:38    Procedures .Critical Care Performed by: Blane OharaZavitz, Timber Lucarelli, MD Authorized by: Blane OharaZavitz, Connar Keating, MD   Critical care provider statement:    Critical care time  (minutes):  40   Critical care start time:  05/10/2019 2:00 PM   Critical care end time:  05/10/2019 2:40 PM   Critical care time was exclusive of:  Separately billable procedures and treating other patients and teaching time   Critical care was necessary to treat or prevent imminent or life-threatening deterioration of the following conditions:  Respiratory failure   Critical care was time spent personally by me on the following activities:  Evaluation of patient's response to treatment, examination of patient, ordering and performing treatments and interventions, ordering and review of laboratory studies, ordering and review of radiographic studies, pulse oximetry, re-evaluation of patient's condition, obtaining history from patient or surrogate and review of old charts   (including critical care time)  Medications Ordered in ED Medications  cefTRIAXone (ROCEPHIN) 1 g in sodium chloride 0.9 % 100 mL IVPB (has no administration in time range)  albuterol (PROVENTIL) (2.5 MG/3ML) 0.083% nebulizer solution 5 mg (has no administration in time range)  EPINEPHrine (EPI-PEN) injection 0.3 mg (0.3 mg Intramuscular Given 05/10/19 1305)     Initial Impression / Assessment and Plan / ED Course  I have reviewed the triage vital signs and the nursing notes.  Pertinent labs & imaging results that were available during my care of the patient were reviewed by me and considered in my medical decision making (see chart for details).       Patient presents with respiratory distress.  Symptoms did start shortly after taking Augmentin so concern possibly for anaphylaxis however patient also has wheezing COPD history.  Patient received nebulizer and steroids IV on route from EMS.  Patient received half a dose of epinephrine and we repeated the dose of epinephrine to look for improvement.  Clarified patient is DNR.  Plan for continued aggressive care with nonrebreather, nebulizer once/if Covid test is negative.   Chest x-ray ordered  Patient does have leukocytosis possibly from the stress, steroids versus infection.  With respiratory distress cough and clinically COPD exacerbation Rocephin and cultures ordered.  Lactate pending.  Nebulizer ordered since COVID test negative.  Patient currently signed out to continue to monitor, follow-up urine results and reassess for admission.  Karlei Waldo Calabrese was evaluated in Emergency Department on 05/10/2019 for the symptoms described in the history of present illness. She was evaluated in the context of the global COVID-19 pandemic, which necessitated consideration that the patient might be at risk for infection with the SARS-CoV-2 virus that causes COVID-19. Institutional protocols and algorithms that pertain to the evaluation of patients at risk for COVID-19 are in a state of rapid change based on information released by regulatory bodies including the CDC and federal and state organizations. These policies and algorithms were followed during the patient's care in the ED.  The patients results and plan were reviewed and discussed.   Any x-rays performed were independently reviewed by myself.   Differential diagnosis were considered with the presenting HPI.  Medications  cefTRIAXone (ROCEPHIN) 1 g in sodium chloride 0.9 % 100 mL IVPB (has no administration in time range)  albuterol (PROVENTIL) (2.5 MG/3ML) 0.083% nebulizer solution 5 mg (has no administration in time range)  EPINEPHrine (EPI-PEN) injection 0.3 mg (0.3 mg Intramuscular Given 05/10/19 1305)    Vitals:   05/10/19 1255 05/10/19 1256 05/10/19 1300 05/10/19 1315  BP: (!) 173/105  (!) 166/106   Pulse: 94  88 93  Resp: 20  (!) 29 20  SpO2: 98%  99% 98%  Weight:  57.2 kg    Height:  4\' 11"  (1.499 m)      Final diagnoses:  Acute respiratory failure with hypoxia (HCC)  Acute encephalopathy  Leukocytosis, unspecified type  Troponin I above reference range  COPD with acute exacerbation (HCC)     Admission/ observation were discussed with the admitting physician, patient and/or family and they are comfortable with the plan.    Final Clinical Impressions(s) / ED Diagnoses   Final diagnoses:  Acute respiratory failure with hypoxia ([place])  Acute encephalopathy  Leukocytosis, unspecified type  Troponin I above reference range  COPD with acute exacerbation [facility])    ED Discharge Orders    None       Elnora Morrison, MD 05/10/19 1517

## 2019-05-11 ENCOUNTER — Inpatient Hospital Stay (HOSPITAL_COMMUNITY): Payer: Medicare Other

## 2019-05-11 DIAGNOSIS — J9602 Acute respiratory failure with hypercapnia: Secondary | ICD-10-CM

## 2019-05-11 DIAGNOSIS — J9601 Acute respiratory failure with hypoxia: Secondary | ICD-10-CM

## 2019-05-11 DIAGNOSIS — I361 Nonrheumatic tricuspid (valve) insufficiency: Secondary | ICD-10-CM

## 2019-05-11 DIAGNOSIS — I371 Nonrheumatic pulmonary valve insufficiency: Secondary | ICD-10-CM

## 2019-05-11 LAB — CBC
HCT: 41.5 % (ref 36.0–46.0)
Hemoglobin: 13.4 g/dL (ref 12.0–15.0)
MCH: 30.5 pg (ref 26.0–34.0)
MCHC: 32.3 g/dL (ref 30.0–36.0)
MCV: 94.3 fL (ref 80.0–100.0)
Platelets: 169 10*3/uL (ref 150–400)
RBC: 4.4 MIL/uL (ref 3.87–5.11)
RDW: 13.5 % (ref 11.5–15.5)
WBC: 10.3 10*3/uL (ref 4.0–10.5)
nRBC: 0 % (ref 0.0–0.2)

## 2019-05-11 LAB — BRAIN NATRIURETIC PEPTIDE: B Natriuretic Peptide: 1643.2 pg/mL — ABNORMAL HIGH (ref 0.0–100.0)

## 2019-05-11 LAB — BASIC METABOLIC PANEL
Anion gap: 12 (ref 5–15)
BUN: 24 mg/dL — ABNORMAL HIGH (ref 8–23)
CO2: 25 mmol/L (ref 22–32)
Calcium: 9.1 mg/dL (ref 8.9–10.3)
Chloride: 104 mmol/L (ref 98–111)
Creatinine, Ser: 0.89 mg/dL (ref 0.44–1.00)
GFR calc Af Amer: >60 mL/min (ref >60)
GFR calc non Af Amer: 57 mL/min — ABNORMAL LOW (ref >60)
Glucose, Bld: 141 mg/dL — ABNORMAL HIGH (ref 70–99)
Potassium: 4.7 mmol/L (ref 3.5–5.1)
Sodium: 141 mmol/L (ref 135–145)

## 2019-05-11 LAB — HEPARIN LEVEL (UNFRACTIONATED)
Heparin Unfractionated: 0.53 IU/mL (ref 0.30–0.70)
Heparin Unfractionated: 0.29 IU/mL — ABNORMAL LOW (ref 0.30–0.70)

## 2019-05-11 MED ORDER — SODIUM CHLORIDE 0.9 % IV SOLN
1.0000 g | INTRAVENOUS | Status: DC
  Administered 2019-05-11 – 2019-05-15 (×4): 1 g via INTRAVENOUS
  Filled 2019-05-11: qty 1
  Filled 2019-05-11: qty 10
  Filled 2019-05-11: qty 1
  Filled 2019-05-11: qty 10
  Filled 2019-05-11: qty 1

## 2019-05-11 MED ORDER — TRAZODONE HCL 50 MG PO TABS
50.0000 mg | ORAL_TABLET | Freq: Once | ORAL | Status: AC
  Administered 2019-05-11: 23:00:00 50 mg via ORAL
  Filled 2019-05-11: qty 1

## 2019-05-11 MED ORDER — HEPARIN BOLUS VIA INFUSION
1500.0000 [IU] | Freq: Once | INTRAVENOUS | Status: AC
  Administered 2019-05-11: 1500 [IU] via INTRAVENOUS
  Filled 2019-05-11: qty 1500

## 2019-05-11 NOTE — Progress Notes (Signed)
[place] for Heparin  Indication: NSTEMI  Allergies  Allergen Reactions  . Crestor [Rosuvastatin Calcium] Other (See Comments)    Cramping/pain in lower extremeties  . Niaspan [Niacin Er] Other (See Comments)    Cramping/pain in lower extremeties    Patient Measurements: Height: 5\' 1"  (154.9 cm) Weight: 116 lb 2.9 oz (52.7 kg) IBW/kg (Calculated) : 47.8 Heparin Dosing Weight:  HEPARIN DW (KG): 52.7  Vital Signs: Temp: 98.3 F (36.8 C) (09/26 0517) Temp Source: Oral (09/26 0517) BP: 117/58 (09/26 0517) Pulse Rate: 74 (09/26 0517)  Labs: Recent Labs    05/10/19 1303 05/10/19 1527 05/10/19 1906 05/10/19 2133 05/11/19 0458  HGB 13.9  --   --   --  13.4  HCT 46.3*  --   --   --  41.5  PLT 320  --   --   --  169  APTT  --   --   --  28  --   HEPARINUNFRC  --   --   --  <0.10* 0.53  CREATININE 0.78  --   --   --  0.89  TROPONINIHS 44* 499* 975*  --   --     Estimated Creatinine Clearance: 32.3 mL/min (by C-G formula based on SCr of 0.89 mg/dL).   Medical History: Past Medical History:  Diagnosis Date  . Cataract   . COPD (chronic obstructive pulmonary disease) ([place])   . GERD (gastroesophageal reflux disease)   . Hypertension   . Osteopenia   . Thyroid disease       Assessment: Pharmacy consulted to dose heparin infusion for this 85 yo female with elevated high-sensitivity Troponins.   Patient wasn't on any anti-coagulation prior to admission.  Baseline CBC is WNL.  9/26 AM update: Heparin level therapeutic   Goal of Therapy:  Heparin level 0.3-0.7 units/ml Monitor platelets by anticoagulation protocol: Yes   Plan:  Cont heparin at 650 units/hr Confirmatory heparin level at [place], PharmD, [place] Pharmacist Phone: [phone]

## 2019-05-11 NOTE — Progress Notes (Signed)
[facility] for Heparin  Indication: NSTEMI vs demand ischemia   Allergies  Allergen Reactions  . Crestor [Rosuvastatin Calcium] Other (See Comments)    Cramping/pain in lower extremeties  . Niaspan [Niacin Er] Other (See Comments)    Cramping/pain in lower extremeties    Patient Measurements: Height: 5\' 1"  (154.9 cm) Weight: 116 lb 2.9 oz (52.7 kg) IBW/kg (Calculated) : 47.8 Heparin Dosing Weight:  HEPARIN DW (KG): 52.7  Vital Signs: Temp: 98.3 F (36.8 C) (09/26 0517) Temp Source: Oral (09/26 0517) BP: 117/58 (09/26 0517) Pulse Rate: 74 (09/26 0517)  Labs: Recent Labs    05/10/19 1303 05/10/19 1527 05/10/19 1906 05/10/19 2133 05/11/19 0458  HGB 13.9  --   --   --  13.4  HCT 46.3*  --   --   --  41.5  PLT 320  --   --   --  169  APTT  --   --   --  28  --   HEPARINUNFRC  --   --   --  <0.10* 0.53  CREATININE 0.78  --   --   --  0.89  TROPONINIHS 44* 499* 975*  --   --     Estimated Creatinine Clearance: 32.3 mL/min (by C-G formula based on SCr of 0.89 mg/dL).   Medical History: Past Medical History:  Diagnosis Date  . Cataract   . COPD (chronic obstructive pulmonary disease) ([place])   . GERD (gastroesophageal reflux disease)   . Hypertension   . Osteopenia   . Thyroid disease       Assessment: Pharmacy consulted to dose heparin infusion for this 85 yo female with elevated high-sensitivity Troponins.   Patient wasn't on any anti-coagulation prior to admission.    Confirmatory heparin level is slightly sub-therapeutic at 0.29 on 650 units/hr. H&H is stable at 13.4/41.5 and plts have dropped from 320-169 but are wnl.    Goal of Therapy:  Heparin level 0.3-0.7 units/ml Monitor platelets by anticoagulation protocol: Yes   Plan:  Bolus heparin 1500 units (30 units/kg) increase heparin infusion to 800 units/hour  Check heparin level in 8 hours Check daily heparin level, cbc, and signs of bleeding     Thank you,    Eddie Candle, PharmD PGY-1 Pharmacy Resident   Please check amion for clinical pharmacist contact number

## 2019-05-11 NOTE — Progress Notes (Signed)
PROGRESS NOTE    Patient: Amy Chambers                            PCP: [name], [name], [name]                    DOB: August 04, 1928            DOA: 05/10/2019 [id number]             DOS: 05/11/2019, 11:53 AM   LOS: 1 day   Date of Service: The patient was seen and examined on 05/11/2019  Subjective:   The patient was seen and examined this morning, denies any chest pain, continues to have mild to moderate respiratory stress, currently satting 98% on 4 L of nasal cannula. She confirms that she is not O2 dependent at home. Denies of any fever chills at this time.  Brief Narrative:   Amy Chambers is a 85 y.o. female with a history of COPD, GERD, hypertension, osteopenia, thyroid disease.  Patient seen due to acute onset of shortness of breath with hypoxia.  Patient had been prescribed Augmentin due to right ear pain after a tele-visit with her PCP.  She took a dose and shortly afterwards began to develop shortness of breath and hypoxia.   EMS was called and found her oxygen saturation in 60s.  She was given steroids, epinephrine and transported to the hospital.  She received more epinephrine here and her breathing began to improve.  No other palliating or provoking factors.  Emergency Department Course: CTA of the chest normal.  CT of the neck shows no abnormalities.  Chest x-ray normal.  White count slightly elevated initial troponin 40 with a repeat of 500.  Lactic acid normal. SARS-CoV-2 negative  Assessment & Plan:   Principal Problem:   Acute respiratory failure with hypoxia and hypercapnia (HCC) Active Problems:   HTN (hypertension)   Hyperlipemia   Hypothyroidism   GERD (gastroesophageal reflux disease)   NSTEMI (non-ST elevated myocardial infarction) (HCC)   Non-STEMI - ?  Demand ischemia -due to respiratory distress -Currently stable denies any chest pain only shortness of breath -Elevated troponins   44>> 975 -N.P.O -Continue IV heparin drip,  -We will continue  supportive therapy, PRN nitroglycerin, morphine, aspirin, -Cardiology consulted appreciate input -Pending 2D Echo;   Acute on chronic respiratory failure with hypoxemia -CTA chest was normal -CT of the neck revealed no acute abnormalities -Chest x-ray normal -Afebrile, normotensive, mildly elevated WBC, lactic acid normal (1.9) -Possibly adverse reaction to antibiotics Augmentin -We will continue to monitor cannula, with supportive therapy -DuoNeb bronchodilators -IV steroids -Initiating empiric antibiotics already received a dose of Rocephin in ED will be continued for leukocytosis Remains afebrile, normotensive with normal lactic acid, anticipating DC antibiotics in next 2 to 3 days  Hypertension -Occluding Benzapril  Lipidemia -Not on any statins  Hypothyroid -To new home dose Synthroid  GERD -PPI  Cultures; 05/11/2019 blood cultures x2  Antimicrobials: 05/11/2019 Rocephin 1 g daily >>  DVT prophylaxis: SCD/Compression stockings and Heparin SQ Code Status:   Code Status: Full Code (H&P states patient is DNR will confirm with the patient) Family Communication: No family member present at bedside- attempt will be made to update daily The above findings and plan of care has been discussed with patient and family in detail,  they expressed understanding and agreement of above. Disposition Plan:   Anticipated 1-2 days Admission status:  NPATIEN -  Consultants: Cardiology  Procedures:   No admission procedures for hospital encounter.     Antimicrobials:  Anti-infectives (From admission, onward)   Start     Dose/Rate Route Frequency Ordered Stop   05/10/19 1515  cefTRIAXone (ROCEPHIN) 1 g in sodium chloride 0.9 % 100 mL IVPB     1 g 200 mL/hr over 30 Minutes Intravenous  Once 05/10/19 1513 05/10/19 1708       Medication:  . aspirin  81 mg Oral Daily  . benazepril  20 mg Oral Daily  . levothyroxine  75 mcg Oral Daily  . loratadine  10 mg Oral Daily  .  methylPREDNISolone (SOLU-MEDROL) injection  60 mg Intravenous Q12H  . mometasone-formoterol  2 puff Inhalation BID  . polyethylene glycol  17 g Oral Daily    albuterol, ibuprofen, ondansetron **OR** ondansetron (ZOFRAN) IV   Objective:   Vitals:   05/11/19 0300 05/11/19 0315 05/11/19 0417 05/11/19 0517  BP: (!) 107/57 123/75  (!) 117/58  Pulse: 89 90  74  Resp:  (!) 21  (!) 21  Temp:    98.3 F (36.8 C)  TempSrc:    Oral  SpO2: 95% 97%  100%  Weight:   52.7 kg 52.7 kg  Height:   5\' 1"  (1.549 m)     Intake/Output Summary (Last 24 hours) at 05/11/2019 1153 Last data filed at 05/11/2019 0423 Gross per 24 hour  Intake 120 ml  Output 200 ml  Net -80 ml   Filed Weights   05/10/19 1256 05/11/19 0417 05/11/19 0517  Weight: 57.2 kg 52.7 kg 52.7 kg     Examination:   Physical Exam  Constitution:  Alert, cooperative, no distress,  Appears calm and comfortable  Psychiatric: Normal and stable mood and affect, cognition intact,   HEENT: Normocephalic, PERRL, otherwise with in Normal limits  Chest:Chest symmetric Cardio vascular:  S1/S2, RRR, No murmure, No Rubs or Gallops  pulmonary: Clear to auscultation bilaterally, respirations unlabored, mild wheezing, and rhonchi, negative any crackles  Abdomen: Soft, non-tender, non-distended, bowel sounds,no masses, no organomegaly Muscular skeletal: Limited exam - in bed, able to move all 4 extremities, Normal strength,  Neuro: CNII-XII intact. , normal motor and sensation, reflexes intact  Extremities: No pitting edema lower extremities, +2 pulses  Skin: Dry, warm to touch, negative for any Rashes, No open wounds Wounds: per nursing documentation  LABs:  CBC Latest Ref Rng & Units 05/11/2019 05/10/2019 03/04/2019  WBC 4.0 - 10.5 K/uL 10.3 22.2(H) 10.2  Hemoglobin 12.0 - 15.0 g/dL 03/06/2019 62.1 30.8  Hematocrit 36.0 - 46.0 % 41.5 46.3(H) 43.3  Platelets 150 - 400 K/uL 169 320 270   CMP Latest Ref Rng & Units 05/11/2019 05/10/2019 03/04/2019   Glucose 70 - 99 mg/dL 03/06/2019) 846(N) 629(B)  BUN 8 - 23 mg/dL 284(X) 32(G) 40(N)  Creatinine 0.44 - 1.00 mg/dL 02(V 2.53 6.64)  Sodium 135 - 145 mmol/L 141 138 140  Potassium 3.5 - 5.1 mmol/L 4.7 4.3 4.5  Chloride 98 - 111 mmol/L 104 104 107  CO2 22 - 32 mmol/L 25 23 25   Calcium 8.9 - 10.3 mg/dL 9.1 4.03(K) 9.2  Total Protein 6.5 - 8.1 g/dL - 7.3 -  Total Bilirubin 0.3 - 1.2 mg/dL - 0.5 -  Alkaline Phos 38 - 126 U/L - 65 -  AST 15 - 41 U/L - 38 -  ALT 0 - 44 U/L - 32 -     SIGNED: , MD, FACP, FHM. [facility],  Pager [phone]  If 7PM-7AM, please contact night-coverage [email or web address] [email or web address] [facility] 05/11/2019, 11:53 AM

## 2019-05-11 NOTE — ED Notes (Signed)
[facility] depart with pt

## 2019-05-11 NOTE — Consult Note (Addendum)
Cardiology Consultation:   Patient ID: Amy Chambers MRN: [id number]; DOB: [birth year]  Admit date: 05/10/2019 Date of Consult: 05/11/2019  Primary Care Provider: Bennie Pierini, [name] Primary Cardiologist: New Primary Electrophysiologist:  None    Patient Profile:   Amy Chambers is a 85 y.o. female with a hx of COPD who is being seen today for the evaluation of SOB and elevated Troponin at the request of Dr Adrian Blackwater.  History of Present Illness:   Amy Chambers is a pleasant 85 year old female from [place] who is followed by [facility].  She has no prior cardiology evaluation although I did see that at one point she had a stress echo ordered in 2009, it does not look like this was ever done.  Patient lives in her own home though her family lives just across the street.  She does not use oxygen at home.  She tells me she has had intermittent chest pain in the past but not for the past several weeks.  Recently she had right ear pain and was started on amoxicillin after virtual visit with her PCP.  She tells me she took 1 dose of the amoxicillin and then went to lay down on the couch.  She then became significantly short of breath and EMS was called.  Her O2 sat was in the 60s.  She was given steroids and epinephrine and admitted.  CT scan was negative although there was an incidental finding of coronary artery disease by calcification.  Her troponins came back elevated, her BNP is 211.  Her EKG shows no acute changes.  Cardiology has been asked to evaluate.  The patient tells me she did not have any chest pain prior to this episode.  She said last week she was doing her usual housework without any problems.  On exam today she has significantly decreased breath sounds and still appears to be short of breath with conversation.  Heart Pathway Score:     Past Medical History:  Diagnosis Date   Cataract    COPD (chronic obstructive pulmonary disease) (HCC)     GERD (gastroesophageal reflux disease)    Hypertension    Osteopenia    Thyroid disease     Past Surgical History:  Procedure Laterality Date   CATARACT EXTRACTION, BILATERAL       Home Medications:  Prior to Admission medications   Medication Sig Start Date End Date Taking? Authorizing Provider  albuterol (VENTOLIN HFA) 108 (90 Base) MCG/ACT inhaler INHALE 2 PUFFS INTO LUNGS EVERY 6 HOURS AS NEEDED FOR WHEEZING Patient taking differently: Inhale 2 puffs into the lungs every 6 (six) hours as needed for wheezing or shortness of breath.  12/04/18  Yes [name], [name], [name]  amoxicillin-clavulanate (AUGMENTIN) 875-125 MG tablet Take 1 tablet by mouth 2 (two) times daily. 05/10/19  Yes [name], [name], [name]  aspirin 81 MG tablet Take 81 mg by mouth daily.   Yes [provider]  benazepril (LOTENSIN) 20 MG tablet Take 1 tablet (20 mg total) by mouth daily. 03/04/19  Yes [name], [name], [name]  budesonide-formoterol (SYMBICORT) 160-4.5 MCG/ACT inhaler Inhale 2 puffs into the lungs 2 (two) times daily. 03/04/19  Yes [name], [name], [name]  calcium-vitamin D 250-100 MG-UNIT per tablet Take 1 tablet by mouth 2 (two) times daily.    Yes [provider]  fluticasone (FLONASE) 50 MCG/ACT nasal spray Place 2 sprays into both nostrils daily. 05/09/19  Yes [name], [name], [name]  levothyroxine (SYNTHROID) 75 MCG tablet  Take 1 tablet (75 mcg total) by mouth daily. 03/04/19  Yes [name], [name], [name]  polyethylene glycol (MIRALAX / GLYCOLAX) packet Take 17 g by mouth daily.   Yes [provider]  VITAMIN D PO Take 1 capsule by mouth daily.   Yes [provider]    Inpatient Medications: Scheduled Meds:  aspirin  81 mg Oral Daily   benazepril  20 mg Oral Daily   levothyroxine  75 mcg Oral Daily   loratadine  10 mg Oral Daily   methylPREDNISolone (SOLU-MEDROL) injection  60 mg Intravenous Q12H   mometasone-formoterol  2 puff Inhalation  BID   polyethylene glycol  17 g Oral Daily   Continuous Infusions:  heparin 650 Units/hr (05/10/19 2202)   PRN Meds: albuterol, ibuprofen, ondansetron **OR** ondansetron (ZOFRAN) IV  Allergies:    Allergies  Allergen Reactions   Crestor [Rosuvastatin Calcium] Other (See Comments)    Cramping/pain in lower extremeties   Niaspan [Niacin Er] Other (See Comments)    Cramping/pain in lower extremeties    Social History:   Social History   Socioeconomic History   Marital status: Married    Spouse name: Not on file   Number of children: Not on file   Years of education: Not on file   Highest education level: Not on file  Occupational History   Not on file  Social Needs   Financial resource strain: Not on file   Food insecurity    Worry: Not on file    Inability: Not on file   Transportation needs    Medical: Not on file    Non-medical: Not on file  Tobacco Use   Smoking status: Former Smoker    Quit date: 08/15/1994    Years since quitting: 24.7   Smokeless tobacco: Former Neurosurgeon    Types: Chew    Quit date: 08/15/1998  Substance and Sexual Activity   Alcohol use: No   Drug use: No   Sexual activity: Not on file  Lifestyle   Physical activity    Days per week: Not on file    Minutes per session: Not on file   Stress: Not on file  Relationships   Social connections    Talks on phone: Not on file    Gets together: Not on file    Attends religious service: Not on file    Active member of club or organization: Not on file    Attends meetings of clubs or organizations: Not on file    Relationship status: Not on file   Intimate partner violence    Fear of current or ex partner: Not on file    Emotionally abused: Not on file    Physically abused: Not on file    Forced sexual activity: Not on file  Other Topics Concern   Not on file  Social History Narrative   Not on file    Family History:    Family History  Problem Relation Age of Onset     Diabetes Mother    Cancer Brother    Tuberculosis Father    Early death Sister    Early death Brother    COPD Brother    Emphysema Brother    Early death Sister    COPD Sister    Emphysema Sister    Aneurysm Sister    Stroke Sister    Cancer Sister    Hypertension Son    Hypertension Daughter    Fibromyalgia Daughter    Hypertension  Daughter      ROS:  Please see the history of present illness.  All other ROS reviewed and negative.     Physical Exam/Data:   Vitals:   05/11/19 0300 05/11/19 0315 05/11/19 0417 05/11/19 0517  BP: (!) 107/57 123/75  (!) 117/58  Pulse: 89 90  74  Resp:  (!) 21  (!) 21  Temp:    98.3 F (36.8 C)  TempSrc:    Oral  SpO2: 95% 97%  100%  Weight:   52.7 kg 52.7 kg  Height:    (1.549 m)     Intake/Output Summary (Last 24 hours) at 05/11/2019 0832 Last data filed at 05/11/2019 0423 Gross per 24 hour  Intake 120 ml  Output 200 ml  Net -80 ml   Last 3 Weights 05/11/2019 05/11/2019 05/10/2019  Weight (lbs) 116 lb 2.9 oz 116 lb 2.9 oz 126 lb 1.7 oz  Weight (kg) 52.7 kg 52.7 kg 57.2 kg     Body mass index is 21.95 kg/m.  General:  Elderly Caucasian female-appears stated age, well developed, on O2, mild SOB with conversation HEENT: normal Lymph: no adenopathy Neck: no JVD Endocrine:  No thryomegaly Vascular: No carotid bruits; DP 2+ bilateraly Cardiac:  normal S1, S2; RRR; no murmur decreased heart sounds overall Lungs: decreased breath sounds throughout  Abd: soft, nontender, no hepatomegaly  Ext: no edema Musculoskeletal:  No deformities, BUE and BLE strength normal and equal Skin: warm and dry  Neuro:  CNs 2-12 intact, no focal abnormalities noted Psych:  Normal affect   EKG:  The EKG was personally reviewed and demonstrates:  NSR, Q V2 (old) Telemetry:  Telemetry was personally reviewed and demonstrates:  NSR  Relevant CV Studies: Echo ordered  Laboratory Data:  High Sensitivity Troponin:   Recent Labs   Lab 05/10/19 1303 05/10/19 1527 05/10/19 1906  TROPONINIHS 44* 499* 975*     Chemistry Recent Labs  Lab 05/10/19 1303 05/11/19 0458  NA 138 141  K 4.3 4.7  CL 104 104  CO2 23 25  GLUCOSE 195* 141*  BUN 27* 24*  CREATININE 0.78 0.89  CALCIUM 8.6* 9.1  GFRNONAA >60 57*  GFRAA >60 >60  ANIONGAP 11 12    Recent Labs  Lab 05/10/19 1303  PROT 7.3  ALBUMIN 4.0  AST 38  ALT 32  ALKPHOS 65  BILITOT 0.5   Hematology Recent Labs  Lab 05/10/19 1303 05/11/19 0458  WBC 22.2* 10.3  RBC 4.69 4.40  HGB 13.9 13.4  HCT 46.3* 41.5  MCV 98.7 94.3  MCH 29.6 30.5  MCHC 30.0 32.3  RDW 13.7 13.5  PLT 320 169   BNP Recent Labs  Lab 05/10/19 1303  BNP 211.0*    DDimer No results for input(s): DDIMER in the last 168 hours.   Radiology/Studies:  Ct Soft Tissue Neck Wo Contrast  Result Date: 05/10/2019 CLINICAL DATA:  Arterial stricture/occlusion head neck. COPD with short of breath and respiratory distress. EXAM: CT NECK WITHOUT CONTRAST TECHNIQUE: Multidetector CT imaging of the neck was performed following the standard protocol without intravenous contrast. COMPARISON:  None. FINDINGS: Pharynx and larynx: Normal. No mass or swelling. Normal airway. No pharyngeal edema. Salivary glands: No inflammation, mass, or stone. Thyroid: Negative Lymph nodes: No pathologic lymph nodes in the neck. Vascular: Limited vascular evaluation without intravenous contrast. Arterial stricture or stenosis not evaluated on the study. There is atherosclerotic calcification in the carotid bulb bilaterally. Atherosclerotic calcification also in the cavernous carotid bilaterally. Limited  intracranial: Negative Visualized orbits: Negative Mastoids and visualized paranasal sinuses: Negative Skeleton: Cervical spondylosis.  No acute skeletal abnormality. Upper chest: Mild apical emphysema. No acute abnormality in the lung apices. Other: None IMPRESSION: No acute abnormality Atherosclerotic calcification of the  carotid arteries bilaterally. Arterial stricture occlusion not excluded on this study without intravenous contrast Airway intact. Electronically Signed   By: Franchot Gallo M.D.   On: 05/10/2019 17:13   Ct Angio Chest Pe W And/or Wo Contrast  Result Date: 05/10/2019 CLINICAL DATA:  COPD, hypertension, respiratory distress, high pretest probability for PE EXAM: CT ANGIOGRAPHY CHEST WITH CONTRAST TECHNIQUE: Multidetector CT imaging of the chest was performed using the standard protocol during bolus administration of intravenous contrast. Multiplanar CT image reconstructions and MIPs were obtained to evaluate the vascular anatomy. CONTRAST:  61mL OMNIPAQUE IOHEXOL 350 MG/ML SOLN COMPARISON:  None. FINDINGS: Cardiovascular: Heart size upper limits normal. Some reflux of contrast from the right atrium into hepatic veins. The RV is nondilated. Satisfactory opacification of pulmonary arteries noted, and there is no evidence of pulmonary emboli. Moderate coronary calcifications. Fair contrast opacification of the thoracic aorta with no suggestion of dissection, aneurysm, or stenosis. There is classic 3-vessel brachiocephalic arch anatomy without proximal stenosis. Calcified plaque in the arch and descending thoracic segment. Visualized proximal abdominal aorta is atheromatous without aneurysm. Mediastinum/Nodes:  no hilar or mediastinal adenopathy. Lungs/Pleura: No pleural effusion. No pneumothorax. Pulmonary emphysema. Upper Abdomen: 2.9 cm probable cyst in hepatic segment 4A. Smaller low-attenuation lesions in the right hepatic lobe are incompletely visualized and characterized. No acute findings. Musculoskeletal: Anterior vertebral endplate spurring at multiple levels in the mid and lower thoracic spine. No fracture or worrisome bone lesion. Review of the MIP images confirms the above findings. IMPRESSION: 1. Negative for acute PE or thoracic aortic dissection. 2. Coronary and Aortic Atherosclerosis (ICD10-170.0).  Electronically Signed   By: Lucrezia Europe M.D.   On: 05/10/2019 17:07   Dg Chest Portable 1 View  Result Date: 05/10/2019 CLINICAL DATA:  Pt brought in by [facility] with c/o SOB that started suddenly around noon. EMS reports no air movement, O2 sat 61% on RA, diaphoretic. EMS reports pt has become less lethargic. EMS gave 2 Albuterol neb, Solumedrol, Epi 0.15 subq. O2 sat increased to 98% with NRB but no change in mental status and pt denies SOB getting better. EMS reports pt took 1st dose of Amoxicillin at 1030 for ear pain that pt has been having for 2 days. Pt has taken PCN before with no problems.HISTORY OF HTN, COPD EXAM: PORTABLE CHEST 1 VIEW COMPARISON:  03/04/2019 and older exams. FINDINGS: Cardiac silhouette is top-normal in size. No mediastinal hilar masses. Prominent bronchovascular markings, stable. Lungs hyperexpanded but otherwise clear with no evidence of pneumonia or edema. No convincing pleural effusion and no pneumothorax. Skeletal structures are demineralized but grossly intact. IMPRESSION: 1. No acute cardiopulmonary disease. Electronically Signed   By: Lajean Manes M.D.   On: 05/10/2019 13:38    Assessment and Plan:   Acute on chronic respiratory failure- Not clear if this was some type of allergic reaction to PCN (no rash, no pruritis) or coronary ischemia mediated respiratory failure, vs COPD exacerbation.  Onset seemed to be fairly sudden favoring the later two possibilities.   Elevated Troponin- Demand ischemia vs true NSTEMI- continue to cycle, awaiting echo  COPD- Not on O2 at home, not followed by a pulmonologist. No recent admissions for COPD exacerbation.   HTN- On benazepril prior to admission  HLD- Untreated  LDL 130  Plan: MD to see. She is on Heparin.  Awaiting echo, continue to cycle troponin.  OK to eat this am.      For questions or updates, please contact [facility] Please consult [email or web address] for contact info under     Signed, Corine ShelterLuke Kilroy, PA-C    05/11/2019 8:32 AM   Attending note  Patient seen and discussed with PA Diona FantiKilroy, I agree with his documentation. 85 yo female history of COPD, GERD, HTN, admitted with severe SOB and hypoxia. Symptoms started very shortly after taking augmentin at home for ear pain. EMS called and found her hypoxic to 60s, given steroids, epi and brought to ER.    K 4.3 Cr 0.78 WBC 22.2 Hgb 13.9 BNP 211 Lactic acid 1.7  hstrop 44-->499-->975--> COVID neg CT PE no PE, +coronary atherosclerosis CXR no acute process EKG SR, anteroseptal Qwaves, no clear acute ischemic changes Echo pending   Probable demand ischemia in setting of possible allergic reaction to augmentin with acute COPD exacerbation. ACS is less likely. Follow trop trend, f/u echo. At this time do not plan for immediate ischemic testing. Follow echo results and trop trend for now.  She does report some chest pains prior to this event, did have some coronary calcifications on CT. If ischemic testing pursued: she cannot run on treadmill, avoid dobutamine with elevated trop, avoid lexiscan due to active bronchospasm. Essentially we would need to consider a CTA. Would have to be improved from medical standpoint to consider.   Dominga FerryJ Wiliam Cauthorn MD

## 2019-05-12 ENCOUNTER — Inpatient Hospital Stay (HOSPITAL_COMMUNITY): Payer: Medicare Other

## 2019-05-12 DIAGNOSIS — R7989 Other specified abnormal findings of blood chemistry: Secondary | ICD-10-CM

## 2019-05-12 LAB — CBC
HCT: 37.7 % (ref 36.0–46.0)
Hemoglobin: 12.1 g/dL (ref 12.0–15.0)
MCH: 30.8 pg (ref 26.0–34.0)
MCHC: 32.1 g/dL (ref 30.0–36.0)
MCV: 95.9 fL (ref 80.0–100.0)
Platelets: 152 10*3/uL (ref 150–400)
RBC: 3.93 MIL/uL (ref 3.87–5.11)
RDW: 13.4 % (ref 11.5–15.5)
WBC: 11.4 10*3/uL — ABNORMAL HIGH (ref 4.0–10.5)
nRBC: 0 % (ref 0.0–0.2)

## 2019-05-12 LAB — HEPARIN LEVEL (UNFRACTIONATED)
Heparin Unfractionated: 0.62 IU/mL (ref 0.30–0.70)
Heparin Unfractionated: 0.68 IU/mL (ref 0.30–0.70)

## 2019-05-12 LAB — TROPONIN I (HIGH SENSITIVITY)
Troponin I (High Sensitivity): 1563 ng/L (ref <18)
Troponin I (High Sensitivity): 1288 ng/L (ref <18)

## 2019-05-12 MED ORDER — ISOSORBIDE MONONITRATE ER 30 MG PO TB24
15.0000 mg | ORAL_TABLET | Freq: Every day | ORAL | Status: DC
  Administered 2019-05-12 – 2019-05-14 (×3): 15 mg via ORAL
  Filled 2019-05-12 (×3): qty 1

## 2019-05-12 MED ORDER — FUROSEMIDE 10 MG/ML IJ SOLN
20.0000 mg | Freq: Once | INTRAMUSCULAR | Status: AC
  Administered 2019-05-12: 20 mg via INTRAVENOUS
  Filled 2019-05-12: qty 2

## 2019-05-12 MED ORDER — TRAZODONE HCL 50 MG PO TABS
50.0000 mg | ORAL_TABLET | Freq: Every evening | ORAL | Status: DC | PRN
  Administered 2019-05-12 – 2019-05-29 (×5): 50 mg via ORAL
  Filled 2019-05-12 (×6): qty 1

## 2019-05-12 MED ORDER — ATORVASTATIN CALCIUM 10 MG PO TABS
10.0000 mg | ORAL_TABLET | Freq: Every day | ORAL | Status: DC
  Administered 2019-05-12 – 2019-05-13 (×2): 10 mg via ORAL
  Filled 2019-05-12 (×2): qty 1

## 2019-05-12 MED ORDER — NITROGLYCERIN 0.4 MG SL SUBL: 0.4000 mg | SUBLINGUAL_TABLET | SUBLINGUAL | Status: DC | PRN

## 2019-05-12 MED ORDER — PANTOPRAZOLE SODIUM 40 MG PO TBEC
40.0000 mg | DELAYED_RELEASE_TABLET | Freq: Every day | ORAL | Status: DC
  Administered 2019-05-12 – 2019-05-28 (×14): 40 mg via ORAL
  Filled 2019-05-12: qty 1
  Filled 2019-05-12: qty 2
  Filled 2019-05-12 (×7): qty 1
  Filled 2019-05-12: qty 2
  Filled 2019-05-12 (×5): qty 1

## 2019-05-12 NOTE — Progress Notes (Addendum)
Progress Note  Patient Name: Amy Chambers Date of Encounter: 05/12/2019  Primary Cardiologist: Dr Wyline MoodBranch (new)  Subjective   She tells me she is less SOB than yesterday.  She did tell me she had some chest pain yesterday but there is no documentation of this.   Inpatient Medications    Scheduled Meds:  aspirin  81 mg Oral Daily   benazepril  20 mg Oral Daily   levothyroxine  75 mcg Oral Daily   loratadine  10 mg Oral Daily   methylPREDNISolone (SOLU-MEDROL) injection  60 mg Intravenous Q12H   mometasone-formoterol  2 puff Inhalation BID   polyethylene glycol  17 g Oral Daily   Continuous Infusions:  cefTRIAXone (ROCEPHIN)  IV 1 g (05/11/19 1535)   heparin 800 Units/hr (05/12/19 0338)   PRN Meds: albuterol, ibuprofen, ondansetron **OR** ondansetron (ZOFRAN) IV   Vital Signs    Vitals:   05/11/19 2250 05/12/19 0608 05/12/19 0733 05/12/19 0748  BP: 123/67 (!) 103/54 136/77   Pulse:  67 80   Resp:  (!) 23 19   Temp:  97.6 F (36.4 C)    TempSrc:  Oral    SpO2:  99% 96% 97%  Weight:  53.5 kg    Height:        Intake/Output Summary (Last 24 hours) at 05/12/2019 0843 Last data filed at 05/11/2019 2000 Gross per 24 hour  Intake 440 ml  Output --  Net 440 ml   Last 3 Weights 05/12/2019 05/11/2019 05/11/2019  Weight (lbs) 117 lb 14.4 oz 116 lb 2.9 oz 116 lb 2.9 oz  Weight (kg) 53.479 kg 52.7 kg 52.7 kg      Telemetry    NSR - Personally Reviewed  ECG    pending - Personally Reviewed  Physical Exam   GEN: Elderly thin female, appears staged age, No acute distress.  On O2 Neck: No JVD Cardiac: RRR, no murmurs, rubs, or gallops.  Respiratory: decreased breath sounds, faint expiratory wheezing GI: Soft, nontender, non-distended  MS: No edema; No deformity. Neuro:  Nonfocal  Psych: Normal affect   Labs    High Sensitivity Troponin:   Recent Labs  Lab 05/10/19 1303 05/10/19 1527 05/10/19 1906 05/12/19 0653  TROPONINIHS 44* 499* 975* 1,563*       Chemistry Recent Labs  Lab 05/10/19 1303 05/11/19 0458  NA 138 141  K 4.3 4.7  CL 104 104  CO2 23 25  GLUCOSE 195* 141*  BUN 27* 24*  CREATININE 0.78 0.89  CALCIUM 8.6* 9.1  PROT 7.3  --   ALBUMIN 4.0  --   AST 38  --   ALT 32  --   ALKPHOS 65  --   BILITOT 0.5  --   GFRNONAA >60 57*  GFRAA >60 >60  ANIONGAP 11 12     Hematology Recent Labs  Lab 05/10/19 1303 05/11/19 0458 05/12/19 0444  WBC 22.2* 10.3 11.4*  RBC 4.69 4.40 3.93  HGB 13.9 13.4 12.1  HCT 46.3* 41.5 37.7  MCV 98.7 94.3 95.9  MCH 29.6 30.5 30.8  MCHC 30.0 32.3 32.1  RDW 13.7 13.5 13.4  PLT 320 169 152    BNP Recent Labs  Lab 05/10/19 1303 05/11/19 1437  BNP 211.0* 1,643.2*     DDimer No results for input(s): DDIMER in the last 168 hours.   Radiology    Ct Soft Tissue Neck Wo Contrast  Result Date: 05/10/2019 CLINICAL DATA:  Arterial stricture/occlusion head neck. COPD with short  of breath and respiratory distress. EXAM: CT NECK WITHOUT CONTRAST TECHNIQUE: Multidetector CT imaging of the neck was performed following the standard protocol without intravenous contrast. COMPARISON:  None. FINDINGS: Pharynx and larynx: Normal. No mass or swelling. Normal airway. No pharyngeal edema. Salivary glands: No inflammation, mass, or stone. Thyroid: Negative Lymph nodes: No pathologic lymph nodes in the neck. Vascular: Limited vascular evaluation without intravenous contrast. Arterial stricture or stenosis not evaluated on the study. There is atherosclerotic calcification in the carotid bulb bilaterally. Atherosclerotic calcification also in the cavernous carotid bilaterally. Limited intracranial: Negative Visualized orbits: Negative Mastoids and visualized paranasal sinuses: Negative Skeleton: Cervical spondylosis.  No acute skeletal abnormality. Upper chest: Mild apical emphysema. No acute abnormality in the lung apices. Other: None IMPRESSION: No acute abnormality Atherosclerotic calcification of the  carotid arteries bilaterally. Arterial stricture occlusion not excluded on this study without intravenous contrast Airway intact. Electronically Signed   By: Marlan Palau M.D.   On: 05/10/2019 17:13   Ct Angio Chest Pe W And/or Wo Contrast  Result Date: 05/10/2019 CLINICAL DATA:  COPD, hypertension, respiratory distress, high pretest probability for PE EXAM: CT ANGIOGRAPHY CHEST WITH CONTRAST TECHNIQUE: Multidetector CT imaging of the chest was performed using the standard protocol during bolus administration of intravenous contrast. Multiplanar CT image reconstructions and MIPs were obtained to evaluate the vascular anatomy. CONTRAST:  67mL OMNIPAQUE IOHEXOL 350 MG/ML SOLN COMPARISON:  None. FINDINGS: Cardiovascular: Heart size upper limits normal. Some reflux of contrast from the right atrium into hepatic veins. The RV is nondilated. Satisfactory opacification of pulmonary arteries noted, and there is no evidence of pulmonary emboli. Moderate coronary calcifications. Fair contrast opacification of the thoracic aorta with no suggestion of dissection, aneurysm, or stenosis. There is classic 3-vessel brachiocephalic arch anatomy without proximal stenosis. Calcified plaque in the arch and descending thoracic segment. Visualized proximal abdominal aorta is atheromatous without aneurysm. Mediastinum/Nodes:  no hilar or mediastinal adenopathy. Lungs/Pleura: No pleural effusion. No pneumothorax. Pulmonary emphysema. Upper Abdomen: 2.9 cm probable cyst in hepatic segment 4A. Smaller low-attenuation lesions in the right hepatic lobe are incompletely visualized and characterized. No acute findings. Musculoskeletal: Anterior vertebral endplate spurring at multiple levels in the mid and lower thoracic spine. No fracture or worrisome bone lesion. Review of the MIP images confirms the above findings. IMPRESSION: 1. Negative for acute PE or thoracic aortic dissection. 2. Coronary and Aortic Atherosclerosis (ICD10-170.0).  Electronically Signed   By: Corlis Leak M.D.   On: 05/10/2019 17:07   Dg Chest Portable 1 View  Result Date: 05/10/2019 CLINICAL DATA:  Pt brought in by [facility] with c/o SOB that started suddenly around noon. EMS reports no air movement, O2 sat 61% on RA, diaphoretic. EMS reports pt has become less lethargic. EMS gave 2 Albuterol neb, Solumedrol, Epi 0.15 subq. O2 sat increased to 98% with NRB but no change in mental status and pt denies SOB getting better. EMS reports pt took 1st dose of Amoxicillin at 1030 for ear pain that pt has been having for 2 days. Pt has taken PCN before with no problems.HISTORY OF HTN, COPD EXAM: PORTABLE CHEST 1 VIEW COMPARISON:  03/04/2019 and older exams. FINDINGS: Cardiac silhouette is top-normal in size. No mediastinal hilar masses. Prominent bronchovascular markings, stable. Lungs hyperexpanded but otherwise clear with no evidence of pneumonia or edema. No convincing pleural effusion and no pneumothorax. Skeletal structures are demineralized but grossly intact. IMPRESSION: 1. No acute cardiopulmonary disease. Electronically Signed   By: Amie Portland M.D.   On:  05/10/2019 13:38    Cardiac Studies   Echo pending  Patient Profile     85 y.o. female admitted 05/10/2019 with acute respiratory failure, ruling in for NSTEMI.   Assessment & Plan    Acute on chronic respiratory failure- Not clear if this was some type of allergic reaction to PCN (no rash, no pruritis) or coronary ischemia mediated respiratory failure, vs COPD exacerbation.   Elevated Troponin- Demand ischemia vs true NSTEMI- Troponin trend now favors NSTEMI with suspected CHF as well based on BNP.  awaiting echo  COPD- Not on O2 at home, not followed by a pulmonologist. No recent admissions for COPD exacerbation.  On exam she appears to have significant COPD. She is getting inhalers, IV steroids, and ABs.   HTN- On benazepril prior to admission  HLD- Untreated LDL 130- H/O myalgia with  Crestor  Plan:  Add low dose Lipitor and low dose nitrate.  She is not a candidate for beta blocker with wheezing.  Will give one dose of Lasix 20 mg IV.  Add PPI since she is on steroids, D/C NSAIDs. Continue heparin.  Consider changing ACE to ARB with COPD.  MD to see.      For questions or updates, please contact [place] Please consult [email or web address] for contact info under        Signed, Kerin Ransom, PA-C  05/12/2019, 8:43 AM    Attending note  Patient seen and discussed with PA Rosalyn Gess, I agree with his documentation above. 85 yo female admitted with severe hypoxia and SOB shortly after taking augmentin. History of COPD with signs of active exacerbation. In this setting elevated troponin trending up to 1563 without a clear peak, BNP elevated at 1600. Echo is pending. Clearly the initial event appeared to be a reaction to augmentin with severe hypoxia and exacerbation of COPD with severe demand, she may have some component of chronic obstructive CAD playing role or less likely ACS. Other possibilties include a stress induced CM.  No chest pain, breathing is improving today.   Follow trop trend as it has not peaked, follow echo. She will need an ischemic evaluation once her respiratory status has improved, pending trop trend and echo would determine CTA vs cath. Would not use lexi with her COPD, no dobutamine with her elevated trop, cannot run on treadmill. Based on trends I think we are likely heading toward a cath.  Continue medical therapy with ASA, atorva 10, benazepril 20, hep gtt. No beta blocker given acute bronchospasm.   Elevated BNP, initial imaging CT and CXR without pulm edema. Given 20mg  IV lasix today. Obtain PCXR   Carlyle Dolly MD

## 2019-05-12 NOTE — Progress Notes (Signed)
[facility] for Heparin  Indication: NSTEMI  Allergies  Allergen Reactions  . Crestor [Rosuvastatin Calcium] Other (See Comments)    Cramping/pain in lower extremeties  . Niaspan [Niacin Er] Other (See Comments)    Cramping/pain in lower extremeties    Patient Measurements: Height: 5\' 1"  (154.9 cm) Weight: 117 lb 14.4 oz (53.5 kg) IBW/kg (Calculated) : 47.8 Heparin Dosing Weight:  HEPARIN DW (KG): 52.7  Vital Signs: Temp: 97.6 F (36.4 C) (09/27 0608) Temp Source: Oral (09/27 0608) BP: 103/54 (09/27 0608) Pulse Rate: 67 (09/27 0608)  Labs: Recent Labs    05/10/19 1303 05/10/19 1527 05/10/19 1906  05/10/19 2133 05/11/19 0458 05/11/19 1437 05/11/19 2338 05/12/19 0444  HGB 13.9  --   --   --   --  13.4  --   --  12.1  HCT 46.3*  --   --   --   --  41.5  --   --  37.7  PLT 320  --   --   --   --  169  --   --  152  APTT  --   --   --   --  28  --   --   --   --   HEPARINUNFRC  --   --   --    < > <0.10* 0.53 0.29* 0.68 0.62  CREATININE 0.78  --   --   --   --  0.89  --   --   --   TROPONINIHS 44* 499* 975*  --   --   --   --   --   --    < > = values in this interval not displayed.    Estimated Creatinine Clearance: 32.3 mL/min (by C-G formula based on SCr of 0.89 mg/dL).   Medical History: Past Medical History:  Diagnosis Date  . Cataract   . COPD (chronic obstructive pulmonary disease) ([place])   . GERD (gastroesophageal reflux disease)   . Hypertension   . Osteopenia   . Thyroid disease       Assessment: Pharmacy consulted to dose heparin infusion for this 85 yo female with elevated high-sensitivity Troponins. Patient wasn't on any anti-coagulation prior to admission.  CBC remains WNL with notable platelet drop (320>152); will cont to monitor. Low probability of HIT. T Score of 3-4 (5-14% risk) Confirmatory heparin level remains therapeutic. No changes necessary at this time.  Goal of Therapy:  Heparin level 0.3-0.7  units/ml Monitor platelets by anticoagulation protocol: Yes   Plan:  Cont heparin at 800 units/hr Monitor daily heparin level and CBC Watch for s/sx of bleeding Watch platelets    Kennon Holter, PharmD PGY1 Ambulatory Care Pharmacy Resident [employer] Phone: [phone]

## 2019-05-12 NOTE — Progress Notes (Signed)
CRITICAL VALUE ALERT  Critical Value:  Troponin 1563  Date & Time Notied:  05-12-19 at 0805  Provider Notified:Brittany Rosita Fire, [place]  Orders Received/Actions taken: awaiting return of call

## 2019-05-12 NOTE — Progress Notes (Signed)
[facility] for Heparin  Indication: NSTEMI  Allergies  Allergen Reactions  . Crestor [Rosuvastatin Calcium] Other (See Comments)    Cramping/pain in lower extremeties  . Niaspan [Niacin Er] Other (See Comments)    Cramping/pain in lower extremeties    Patient Measurements: Height: 5\' 1"  (154.9 cm) Weight: 116 lb 2.9 oz (52.7 kg) IBW/kg (Calculated) : 47.8 Heparin Dosing Weight:  HEPARIN DW (KG): 52.7  Vital Signs: Temp: 97.6 F (36.4 C) (09/26 1946) Temp Source: Oral (09/26 1946) BP: 123/67 (09/26 2250) Pulse Rate: 82 (09/26 2019)  Labs: Recent Labs    05/10/19 1303 05/10/19 1527 05/10/19 1906  05/10/19 2133 05/11/19 0458 05/11/19 1437 05/11/19 2338  HGB 13.9  --   --   --   --  13.4  --   --   HCT 46.3*  --   --   --   --  41.5  --   --   PLT 320  --   --   --   --  169  --   --   APTT  --   --   --   --  28  --   --   --   HEPARINUNFRC  --   --   --    < > <0.10* 0.53 0.29* 0.68  CREATININE 0.78  --   --   --   --  0.89  --   --   TROPONINIHS 44* 499* 975*  --   --   --   --   --    < > = values in this interval not displayed.    Estimated Creatinine Clearance: 32.3 mL/min (by C-G formula based on SCr of 0.89 mg/dL).   Medical History: Past Medical History:  Diagnosis Date  . Cataract   . COPD (chronic obstructive pulmonary disease) ([place])   . GERD (gastroesophageal reflux disease)   . Hypertension   . Osteopenia   . Thyroid disease       Assessment: Pharmacy consulted to dose heparin infusion for this 85 yo female with elevated high-sensitivity Troponins.   Patient wasn't on any anti-coagulation prior to admission.  Baseline CBC is WNL.  9/27 AM update: Heparin level therapeutic x 1 after rate increase  Goal of Therapy:  Heparin level 0.3-0.7 units/ml Monitor platelets by anticoagulation protocol: Yes   Plan:  Cont heparin at 800 units/hr Confirmatory heparin level with AM labs  Narda Bonds, PharmD,  [facility] Pharmacist Phone: [phone]

## 2019-05-12 NOTE — Progress Notes (Signed)
PROGRESS NOTEDeyanira Feslerncy M Chambers BJY:[id number] DOB: 05-Feb-1928 DOA: 05/10/2019 PCP: Bennie Pierini, [name]  HPI/Recap of past 24 hours: Amy Chambers is a 85 y.o. female with a history of COPD, GERD, hypertension, osteopenia, thyroid disease.  Patient seen due to acute onset of shortness of breath with hypoxia.  Patient had been prescribed Augmentin due to right ear pain after a tele-visit with her PCP.  She took a dose this morning and shortly afterwards began to develop shortness of breath and hypoxia.  EMS was called and found her oxygen saturation in 60s.  She was given steroids, epinephrine and transported to the hospital.  She received more epinephrine here and her breathing began to improve.  No other palliating or provoking factors.  Emergency Department Course: CTA of the chest normal.  CT of the neck shows no abnormalities.  Chest x-ray normal.  White count slightly elevated initial troponin 40 with a repeat of 500.  Lactic acid normal.  05/12/19: Patient was seen and examined at bedside.  She reports intermittent nonproductive cough not on oxygen supplementation at baseline.  Appears short winded on exam with use of accessory muscles.  Assessment/Plan: Principal Problem:   Acute respiratory failure with hypoxia and hypercapnia (HCC) Active Problems:   HTN (hypertension)   Hyperlipemia   Hypothyroidism   GERD (gastroesophageal reflux disease)   NSTEMI (non-ST elevated myocardial infarction) (HCC)  Acute hypoxic respiratory failure suspect multifactorial secondary to acute COPD exacerbation versus unclear allergic reaction to Augmentin versus other Not on oxygen supplementation at baseline Currently requiring 2 L to maintain oxygen saturation greater than 92% On Rocephin, started on 05/11/19 for COPD exacerbation Continue Dulera Continue IV Solu-Medrol 60 mg twice daily Continue p.o. Protonix for GI prophylaxis while on steroids   Elevated troponin Peaked at 1563 and trended  down Seen by cardiology, awaiting 2D echo results Currently on heparin drip Denies anginal symptoms at this time T wave abnormalities in inferior and lateral leads  Hypothyroidism Continue levothyroxine  Elevated BNP Presented with BNP greater than 1643 2D echo obtained and pending  Essential hypertension Blood pressure is at goal Continue benazepril   DVT prophylaxis:  Heparin drip Code Status:   Code Status: Full Code   Family Communication:  None at bedside.  Disposition Plan:    Patient is currently not appropriate for discharge due to ongoing evaluation of elevated troponin.  2D echo pending.  Will need an ischemic evaluation once her respiratory status has improved per cardiology.  Patient will require at least 2 midnights for further evaluation and treatment of present condition.   Consultants: Cardiology   Objective: Vitals:   05/12/19 0608 05/12/19 0733 05/12/19 0748 05/12/19 1146  BP: (!) 103/54 136/77  (!) 153/74  Pulse: 67 80  86  Resp: (!) 23 19  (!) 29  Temp: 97.6 F (36.4 C)     TempSrc: Oral     SpO2: 99% 96% 97% 90%  Weight: 53.5 kg     Height:        Intake/Output Summary (Last 24 hours) at 05/12/2019 1252 Last data filed at 05/12/2019 1040 Gross per 24 hour  Intake 680 ml  Output 400 ml  Net 280 ml   Filed Weights   05/11/19 0417 05/11/19 0517 05/12/19 0865  Weight: 52.7 kg 52.7 kg 53.5 kg    Exam:  . General: 85 y.o. year-old female well developed well nourished in no acute distress.  Alert and oriented x3. . Cardiovascular: Regular rate and rhythm  with no rubs or gallops.  No thyromegaly or JVD noted.   Marland Kitchen. Respiratory: Mild diffuse wheezing bilaterally.  No rales noted. Good inspiratory effort. . Abdomen: Soft nontender nondistended with normal bowel sounds x4 quadrants. . Musculoskeletal: No lower extremity edema. 2/4 pulses in all 4 extremities. Marland Kitchen. Psychiatry: Mood is appropriate for condition and setting   Data Reviewed: CBC:  Recent Labs  Lab 05/10/19 1303 05/11/19 0458 05/12/19 0444  WBC 22.2* 10.3 11.4*  NEUTROABS 12.6*  --   --   HGB 13.9 13.4 12.1  HCT 46.3* 41.5 37.7  MCV 98.7 94.3 95.9  PLT 320 169 152   Basic Metabolic Panel: Recent Labs  Lab 05/10/19 1303 05/11/19 0458  NA 138 141  K 4.3 4.7  CL 104 104  CO2 23 25  GLUCOSE 195* 141*  BUN 27* 24*  CREATININE 0.78 0.89  CALCIUM 8.6* 9.1   GFR: Estimated Creatinine Clearance: 32.3 mL/min (by C-G formula based on SCr of 0.89 mg/dL). Liver Function Tests: Recent Labs  Lab 05/10/19 1303  AST 38  ALT 32  ALKPHOS 65  BILITOT 0.5  PROT 7.3  ALBUMIN 4.0   No results for input(s): LIPASE, AMYLASE in the last 168 hours. No results for input(s): AMMONIA in the last 168 hours. Coagulation Profile: No results for input(s): INR, PROTIME in the last 168 hours. Cardiac Enzymes: No results for input(s): CKTOTAL, CKMB, CKMBINDEX, TROPONINI in the last 168 hours. BNP (last 3 results) No results for input(s): PROBNP in the last 8760 hours. HbA1C: No results for input(s): HGBA1C in the last 72 hours. CBG: No results for input(s): GLUCAP in the last 168 hours. Lipid Profile: No results for input(s): CHOL, HDL, LDLCALC, TRIG, CHOLHDL, LDLDIRECT in the last 72 hours. Thyroid Function Tests: No results for input(s): TSH, T4TOTAL, FREET4, T3FREE, THYROIDAB in the last 72 hours. Anemia Panel: No results for input(s): VITAMINB12, FOLATE, FERRITIN, TIBC, IRON, RETICCTPCT in the last 72 hours. Urine analysis:    Component Value Date/Time   COLORURINE YELLOW 05/10/2019 1741   APPEARANCEUR CLEAR 05/10/2019 1741   APPEARANCEUR Clear 02/22/2019 1546   LABSPEC >1.046 (H) 05/10/2019 1741   PHURINE 5.0 05/10/2019 1741   GLUCOSEU NEGATIVE 05/10/2019 1741   HGBUR SMALL (A) 05/10/2019 1741   BILIRUBINUR NEGATIVE 05/10/2019 1741   BILIRUBINUR Negative 02/22/2019 1546   KETONESUR 20 (A) 05/10/2019 1741   PROTEINUR 100 (A) 05/10/2019 1741   UROBILINOGEN  negative 01/05/2015 1225   NITRITE NEGATIVE 05/10/2019 1741   LEUKOCYTESUR NEGATIVE 05/10/2019 1741   Sepsis Labs: @LABRCNTIP (procalcitonin:4,lacticidven:4)  ) Recent Results (from the past 240 hour(s))  SARS Coronavirus 2 [facility](Hospital order, [facility]) Nasopharyngeal Nasopharyngeal Swab     Status: None   Collection Time: 05/10/19  1:04 PM   Specimen: Nasopharyngeal Swab  Result Value Ref Range Status   SARS Coronavirus 2 NEGATIVE NEGATIVE Final    Comment: (NOTE) If result is NEGATIVE SARS-CoV-2 target nucleic acids are NOT DETECTED. The SARS-CoV-2 RNA is generally detectable in upper and lower  respiratory specimens during the acute phase of infection. The lowest  concentration of SARS-CoV-2 viral copies this assay can detect is 250  copies / mL. A negative result does not preclude SARS-CoV-2 infection  and should not be used as the sole basis for treatment or other  patient management decisions.  A negative result may occur with  improper specimen collection / handling, submission of specimen other  than nasopharyngeal swab, presence of viral mutation(s) within the  areas targeted by this assay, and inadequate number of viral copies  (<250 copies / mL). A negative result must be combined with clinical  observations, patient history, and epidemiological information. If result is POSITIVE SARS-CoV-2 target nucleic acids are DETECTED. The SARS-CoV-2 RNA is generally detectable in upper and lower  respiratory specimens dur ing the acute phase of infection.  Positive  results are indicative of active infection with SARS-CoV-2.  Clinical  correlation with patient history and other diagnostic information is  necessary to determine patient infection status.  Positive results do  not rule out bacterial infection or co-infection with other viruses. If result is PRESUMPTIVE POSTIVE SARS-CoV-2 nucleic acids MAY BE PRESENT.   A presumptive positive result was  obtained on the submitted specimen  and confirmed on repeat testing.  While 2019 novel coronavirus  (SARS-CoV-2) nucleic acids may be present in the submitted sample  additional confirmatory testing may be necessary for epidemiological  and / or clinical management purposes  to differentiate between  SARS-CoV-2 and other Sarbecovirus currently known to infect humans.  If clinically indicated additional testing with an alternate test  methodology [phone]) is advised. The SARS-CoV-2 RNA is generally  detectable in upper and lower respiratory sp ecimens during the acute  phase of infection. The expected result is Negative. Fact Sheet for Patients:  BoilerBrush.com.cy Fact Sheet for Healthcare Providers: https://pope.com/ This test is not yet approved or cleared by the [place] FDA and has been authorized for detection and/or diagnosis of SARS-CoV-2 by FDA under an Emergency Use Authorization (EUA).  This EUA will remain in effect (meaning this test can be used) for the duration of the COVID-19 declaration under Section 564(b)(1) of the Act, 21 U.S.C. section 360bbb-3(b)(1), unless the authorization is terminated or revoked sooner. Performed at [facility], [address] [place], [place] [address]   Blood culture (routine x 2)     Status: None (Preliminary result)   Collection Time: 05/10/19  3:34 PM   Specimen: Right Antecubital; Blood  Result Value Ref Range Status   Specimen Description RIGHT ANTECUBITAL  Final   Special Requests   Final    BOTTLES DRAWN AEROBIC AND ANAEROBIC Blood Culture adequate volume   Culture   Final    NO GROWTH 2 DAYS Performed at [facility], [address], [place], [place] [address]    Report Status PENDING  Incomplete  Blood culture (routine x 2)     Status: None (Preliminary result)   Collection Time: 05/10/19  3:41 PM   Specimen: BLOOD RIGHT HAND  Result Value Ref Range Status   Specimen  Description BLOOD RIGHT HAND  Final   Special Requests   Final    BOTTLES DRAWN AEROBIC ONLY Blood Culture results may not be optimal due to an inadequate volume of blood received in culture bottles   Culture   Final    NO GROWTH 2 DAYS Performed at [facility], [address] [place], [place] [address]    Report Status PENDING  Incomplete      Studies: No results found.  Scheduled Meds: . aspirin  81 mg Oral Daily  . atorvastatin  10 mg Oral q1800  . benazepril  20 mg Oral Daily  . isosorbide mononitrate  15 mg Oral Daily  . levothyroxine  75 mcg Oral Daily  . loratadine  10 mg Oral Daily  . methylPREDNISolone (SOLU-MEDROL) injection  60 mg Intravenous Q12H  . mometasone-formoterol  2 puff Inhalation BID  . pantoprazole  40  mg Oral Q0600  . polyethylene glycol  17 g Oral Daily    Continuous Infusions: . cefTRIAXone (ROCEPHIN)  IV 1 g (05/11/19 1535)  . heparin 800 Units/hr (05/12/19 0338)     LOS: 2 days     Kayleen Memos, MD [facility] Pager [phone]  If 7PM-7AM, please contact night-coverage [email or web address] Password [facility] 05/12/2019, 12:52 PM

## 2019-05-13 LAB — BASIC METABOLIC PANEL
Anion gap: 10 (ref 5–15)
BUN: 38 mg/dL — ABNORMAL HIGH (ref 8–23)
CO2: 27 mmol/L (ref 22–32)
Calcium: 8.8 mg/dL — ABNORMAL LOW (ref 8.9–10.3)
Chloride: 103 mmol/L (ref 98–111)
Creatinine, Ser: 0.76 mg/dL (ref 0.44–1.00)
GFR calc Af Amer: >60 mL/min (ref >60)
GFR calc non Af Amer: >60 mL/min (ref >60)
Glucose, Bld: 137 mg/dL — ABNORMAL HIGH (ref 70–99)
Potassium: 4.2 mmol/L (ref 3.5–5.1)
Sodium: 140 mmol/L (ref 135–145)

## 2019-05-13 LAB — CBC
HCT: 37.4 % (ref 36.0–46.0)
Hemoglobin: 11.7 g/dL — ABNORMAL LOW (ref 12.0–15.0)
MCH: 29.8 pg (ref 26.0–34.0)
MCHC: 31.3 g/dL (ref 30.0–36.0)
MCV: 95.2 fL (ref 80.0–100.0)
Platelets: 153 10*3/uL (ref 150–400)
RBC: 3.93 MIL/uL (ref 3.87–5.11)
RDW: 13.3 % (ref 11.5–15.5)
WBC: 6.7 10*3/uL (ref 4.0–10.5)
nRBC: 0 % (ref 0.0–0.2)

## 2019-05-13 LAB — HEPARIN LEVEL (UNFRACTIONATED): Heparin Unfractionated: 0.48 IU/mL (ref 0.30–0.70)

## 2019-05-13 LAB — PATHOLOGIST SMEAR REVIEW

## 2019-05-13 MED ORDER — SODIUM CHLORIDE 0.9% FLUSH: 3.0000 mL | INTRAVENOUS | Status: DC | PRN

## 2019-05-13 MED ORDER — SODIUM CHLORIDE 0.9% FLUSH
3.0000 mL | Freq: Two times a day (BID) | INTRAVENOUS | Status: DC
  Administered 2019-05-13 – 2019-05-31 (×25): 3 mL via INTRAVENOUS

## 2019-05-13 MED ORDER — SODIUM CHLORIDE 0.9 % IV SOLN
INTRAVENOUS | Status: DC
  Administered 2019-05-14: 06:00:00 via INTRAVENOUS

## 2019-05-13 MED ORDER — SODIUM CHLORIDE 0.9 % IV SOLN: 250.0000 mL | INTRAVENOUS | Status: DC | PRN

## 2019-05-13 MED ORDER — GUAIFENESIN ER 600 MG PO TB12
1200.0000 mg | ORAL_TABLET | Freq: Two times a day (BID) | ORAL | Status: DC
  Administered 2019-05-13 – 2019-05-14 (×3): 1200 mg via ORAL
  Filled 2019-05-13 (×3): qty 2

## 2019-05-13 MED ORDER — ACETAMINOPHEN 325 MG PO TABS
650.0000 mg | ORAL_TABLET | Freq: Four times a day (QID) | ORAL | Status: DC | PRN
  Administered 2019-05-13 – 2019-05-26 (×10): 650 mg via ORAL
  Filled 2019-05-13 (×9): qty 2

## 2019-05-13 NOTE — Progress Notes (Signed)
   Plan is for left cardiac catheterization tomorrow. Discussed this with patient and daughter. The patient understands that risks include but are not limited to stroke (1 in 1000), death (1 in 63), kidney failure [usually temporary] (1 in 500), bleeding (1 in 200), allergic reaction [possibly serious] (1 in 200), and agrees to proceed.   Procedure is scheduled for 05/14/2019 at 12:00pm with Dr. Angelena Form. Pre-cath orders have been placed and patient will be NPO at midnight.  Darreld Mclean, PA-C 05/13/2019 11:56 AM

## 2019-05-13 NOTE — Progress Notes (Signed)
[facility] for Heparin  Indication: NSTEMI  Allergies  Allergen Reactions  . Augmentin [Amoxicillin-Pot Clavulanate]   . Crestor [Rosuvastatin Calcium] Other (See Comments)    Cramping/pain in lower extremeties  . Niaspan [Niacin Er] Other (See Comments)    Cramping/pain in lower extremeties    Patient Measurements: Height: 5\' 1"  (154.9 cm) Weight: 118 lb 6.4 oz (53.7 kg) IBW/kg (Calculated) : 47.8 Heparin Dosing Weight:  HEPARIN DW (KG): 52.7  Vital Signs: Temp: 98.1 F (36.7 C) (09/28 0341) Temp Source: Oral (09/28 0341) BP: 157/85 (09/28 0907) Pulse Rate: 65 (09/28 0907)  Labs: Recent Labs    05/10/19 1303  05/10/19 1906  05/10/19 2133 05/11/19 0458  05/11/19 2338 05/12/19 0444 05/12/19 0653 05/12/19 0908 05/13/19 0853  HGB 13.9  --   --   --   --  13.4  --   --  12.1  --   --  11.7*  HCT 46.3*  --   --   --   --  41.5  --   --  37.7  --   --  37.4  PLT 320  --   --   --   --  169  --   --  152  --   --  153  APTT  --   --   --   --  28  --   --   --   --   --   --   --   HEPARINUNFRC  --   --   --    < > <0.10* 0.53   < > 0.68 0.62  --   --  0.48  CREATININE 0.78  --   --   --   --  0.89  --   --   --   --   --  0.76  TROPONINIHS 44*   < > 975*  --   --   --   --   --   --  1,563* 1,288*  --    < > = values in this interval not displayed.    Estimated Creatinine Clearance: 36 mL/min (by C-G formula based on SCr of 0.76 mg/dL).   Medical History: Past Medical History:  Diagnosis Date  . Cataract   . COPD (chronic obstructive pulmonary disease) ([place])   . GERD (gastroesophageal reflux disease)   . Hypertension   . Osteopenia   . Thyroid disease       Assessment: Pharmacy consulted to dose heparin infusion for this 85 yo female with elevated high-sensitivity Troponins. Patient wasn't on any anti-coagulation prior to admission.  Heparin level therapeutic this AM  Goal of Therapy:  Heparin level 0.3-0.7  units/ml Monitor platelets by anticoagulation protocol: Yes   Plan:  Cont heparin at 800 units/hr Monitor daily heparin level and CBC Watch for s/sx of bleeding Watch platelets   Thank you Anette Guarneri, PharmD [phone]

## 2019-05-13 NOTE — Progress Notes (Signed)
Progress Note  Patient Name: Amy Chambers M Ohanian Date of Encounter: 05/13/2019  Primary Cardiologist: New  Subjective   No acute overnight events. Patient states breathing is close to baseline but does states it is slightly worse than yesterday. She is still on 2L of O2 via nasal cannula (not on any O2 at home). There are reports of chest pain in rounding note yesterday. Patient report very brief episodes of pain 2 days ago but none since.  Inpatient Medications    Scheduled Meds: . aspirin  81 mg Oral Daily  . atorvastatin  10 mg Oral q1800  . benazepril  20 mg Oral Daily  . isosorbide mononitrate  15 mg Oral Daily  . levothyroxine  75 mcg Oral Daily  . loratadine  10 mg Oral Daily  . methylPREDNISolone (SOLU-MEDROL) injection  60 mg Intravenous Q12H  . mometasone-formoterol  2 puff Inhalation BID  . pantoprazole  40 mg Oral Q0600  . polyethylene glycol  17 g Oral Daily   Continuous Infusions: . cefTRIAXone (ROCEPHIN)  IV 1 g (05/12/19 1528)  . heparin 800 Units/hr (05/12/19 0338)   PRN Meds: albuterol, nitroGLYCERIN, ondansetron **OR** ondansetron (ZOFRAN) IV, traZODone   Vital Signs    Vitals:   05/12/19 1525 05/12/19 1940 05/12/19 2034 05/13/19 0341  BP: 131/79 128/76  127/77  Pulse: 70 79  68  Resp: 20 (!) 22  18  Temp: 98 F (36.7 C) 98.6 F (37 C)  98.1 F (36.7 C)  TempSrc: Oral Oral  Oral  SpO2: 96% 98% 99% 98%  Weight:    53.7 kg  Height:        Intake/Output Summary (Last 24 hours) at 05/13/2019 0813 Last data filed at 05/12/2019 2000 Gross per 24 hour  Intake 660 ml  Output 950 ml  Net -290 ml   Last 3 Weights 05/13/2019 05/12/2019 05/11/2019  Weight (lbs) 118 lb 6.4 oz 117 lb 14.4 oz 116 lb 2.9 oz  Weight (kg) 53.706 kg 53.479 kg 52.7 kg      Telemetry    Normal sinus rhythm with rates mostly in the 60's to 90's and occasional PACs. - Personally Reviewed  ECG    No new ECG tracing today. - Personally Reviewed  Physical Exam   GEN: Elderly  female resting comfortably in no acute distress.   Neck: Supple. Cardiac: RRR. No murmurs, gallops, or rubs. Radial pulses 2+ and equal bilaterally.  Respiratory: On 2L of O2 via nasal cannula. No increased work of breathing. Decreased breath sounds throughout but lungs clear. No wheezes, rhonchi, or rales appreciated. GI: Soft, non-distended, and non-tender.  MS: No lower extremity edema. No deformities. Skin: Warm and dry. Neuro:  No focal deficits. Psych: Normal affect. Responds appropriately.  Labs    High Sensitivity Troponin:   Recent Labs  Lab 05/10/19 1303 05/10/19 1527 05/10/19 1906 05/12/19 0653 05/12/19 0908  TROPONINIHS 44* 499* 975* 1,563* 1,288*      Chemistry Recent Labs  Lab 05/10/19 1303 05/11/19 0458  NA 138 141  K 4.3 4.7  CL 104 104  CO2 23 25  GLUCOSE 195* 141*  BUN 27* 24*  CREATININE 0.78 0.89  CALCIUM 8.6* 9.1  PROT 7.3  --   ALBUMIN 4.0  --   AST 38  --   ALT 32  --   ALKPHOS 65  --   BILITOT 0.5  --   GFRNONAA >60 57*  GFRAA >60 >60  ANIONGAP 11 12     Hematology  Recent Labs  Lab 05/10/19 1303 05/11/19 0458 05/12/19 0444  WBC 22.2* 10.3 11.4*  RBC 4.69 4.40 3.93  HGB 13.9 13.4 12.1  HCT 46.3* 41.5 37.7  MCV 98.7 94.3 95.9  MCH 29.6 30.5 30.8  MCHC 30.0 32.3 32.1  RDW 13.7 13.5 13.4  PLT 320 169 152    BNP Recent Labs  Lab 05/10/19 1303 05/11/19 1437  BNP 211.0* 1,643.2*     DDimer No results for input(s): DDIMER in the last 168 hours.   Radiology    Dg Chest Port 1 View  Result Date: 05/12/2019 CLINICAL DATA:  Shortness of breath and lethargy EXAM: PORTABLE CHEST 1 VIEW COMPARISON:  05/10/2019, 03/04/2019 FINDINGS: Hyperinflated lungs with emphysematous disease. Mild scarring at the bases. No consolidation or effusion. Stable cardiomediastinal silhouette with aortic atherosclerosis. Probable skin fold artifact over the right upper and lower chest. IMPRESSION: 1. Hyperinflated lungs with emphysematous disease. No  focal pulmonary infiltrate. 2. Suspected skin fold artifact over the right upper lung, no definitive pneumothorax seen. Electronically Signed   By: Jasmine Pang M.D.   On: 05/12/2019 16:12    Cardiac Studies   Chest CTA 05/10/2019:  Impression:  1. Negative for acute PE or thoracic aortic dissection.  2. Coronary and Aortic Atherosclerosis (ICD10-170.0).  _______________  Echocardiogram 05/11/2019: Results pending.   Patient Profile     Amy Chambers is a 85 year old female with a history of hypertension, COPD, and GERD who was admitted on 05/10/2019 for acute respiratory failure with hypoxia after presenting with sudden onset of shortness of breath with hypoxia after taking dose of Augmentin. Cardiology was consulted for further evaluation of shortness of breath and elevated troponin at the request of Dr. Adrian Blackwater.   Assessment & Plan    Acute on Chronic Respiratory Failure  - Possible allergic reaction to Augmentin. Differential diagnosis also includes coronary ischemia mediated respiratory failure and COPD exacerbation. Breathing improving but still on 2L of O2 via nasal cannula. - Repeat chest x-ray yesterday showed hyperinflated lungs with emphysematous disease but no focal pulmonary infiltrate.  - BNP elevated at 1,643.2. Patient receives one dose of IV Lasix 20mg  yesterday with 950 mL of urine output. Appears euvolemic on exam so will hold off on any additional diuretics.  - Troponin elevated as below.  - Chest CTA negative for PE.  - COVID-19 negative.  - Currently being treated with empiric antibiotics, IV steroids, and DuoNeb bronchodilators.  - Management per primary team.    Demand Ischemia vs true NSTEMI  - High-sensitivity troponin elevated at 44 >> 499 >> 975 >> 1,563 >> 1,288.  - EKG yesterday showed normal sinus rhythm with new T wave inversion in inferior leads and leads V3-V6 compared to tracing from the day before. - Echo has been done but reading pending. -  Possibly demand ischemia in setting of acute hypoxic respiratory failure. However, may be some component of chronic obstructive CAD playing role or less likely ACS. Other possibilties include a stress induced CM. May need an ischemic evaluation with coronary CT or cath once respiratory status has improved  - Currently chest pain free.  - Will repeat EKG today.  - Currently on IV Heparin. Continue for now. - Continue aspirin and statin. No beta-blocker given acute bronchospasm.    COPD  - Not on any O2 at home. Does not follow with a Pulmonologist.  - Management per primary team.    Hypertension  - BP well controlled. Most recent BP 127/77. -  Continue Benazepril 20mg  daily.   Hyperlipidemia  - Lipid panel from 08/2018: Total Cholesterol 204, Triglycerides 140, HDL 46, LDL 130. - Continue Lipitor 10mg  daily (started this admission).  - Will need repeat lipid panel and LFTs in 6 weeks.  Hypothyroidism - Continue Synthroid per primary team.  For questions or updates, please contact [place] Please consult [email or web address] for contact info under        Signed, Darreld Mclean, PA-C  05/13/2019, 8:13 AM

## 2019-05-13 NOTE — Care Management Important Message (Signed)
Important Message  Patient Details  Name: Amy Chambers MRN: [id number] Date of Birth: Jan 10, 1928   Medicare Important Message Given:  Yes     Orbie Pyo 05/13/2019, 4:05 PM

## 2019-05-13 NOTE — Progress Notes (Signed)
PROGRESS NOTE  Amy Chambers TML:[id number] DOB: [birth year] DOA: 05/10/2019 PCP: Bennie Pierini, [name]  HPI/Recap of past 24 hours: Amy Chambers is a 85 y.o. female with a history of COPD, GERD, hypertension, osteopenia, thyroid disease.  Patient seen due to acute onset of shortness of breath with hypoxia.  Patient had been prescribed Augmentin due to right ear pain after a tele-visit with her PCP.  She took a dose this morning and shortly afterwards began to develop shortness of breath and hypoxia.  EMS was called and found her oxygen saturation in 60s.  She was given steroids, epinephrine and transported to the hospital.  She received more epinephrine here and her breathing began to improve.  No other palliating or provoking factors.  Emergency Department Course: CTA of the chest normal.  CT of the neck shows no abnormalities.  Chest x-ray normal.  White count slightly elevated initial troponin 40 with a repeat of 500.  Lactic acid normal.  05/13/19: Patient was seen and examined at bedside this morning.  No acute events overnight.  She reports usual cough.  Denies chest pain at this time.  Conversational dyspnea noted on exam.  She is on heparin drip.  Management per cardiology.  Assessment/Plan: Principal Problem:   Acute respiratory failure with hypoxia and hypercapnia (HCC) Active Problems:   HTN (hypertension)   Hyperlipemia   Hypothyroidism   GERD (gastroesophageal reflux disease)   NSTEMI (non-ST elevated myocardial infarction) (HCC)  Persistent acute hypoxic respiratory failure suspect multifactorial secondary to acute COPD exacerbation versus unclear allergic reaction to Augmentin versus other Not on oxygen supplementation at baseline Currently requiring 2 L to maintain oxygen saturation greater than 92% On Rocephin, day #2, started on 05/11/19 for COPD exacerbation Continue Dulera Continue IV Solu-Medrol 60 mg twice daily Continue p.o. Protonix for GI prophylaxis while on  steroids  Continue to maintain O2 saturation greater than 92%  Acute COPD exacerbation Management as stated above Add flutter valve  Chronic cough/bronchitis Management per above Continue to treat symptomatically  Elevated troponin Peaked at 1563 and trended down Seen by cardiology, awaiting 2D echo results Currently on heparin drip Denies anginal symptoms at this time. T wave abnormalities in inferior and lateral leads personally reviewed twelve-lead EKG from 05/12/2019.  Physical debility PT OT to assess Fall precautions  Hypothyroidism Continue levothyroxine  Elevated BNP Presented with BNP greater than 1643 2D echo obtained and pending  Essential hypertension Blood pressure is at goal Continue benazepril Obtain BMP in the morning   DVT prophylaxis:  Heparin drip Code Status:   Code Status: Full Code   Family Communication:  None at bedside.  Disposition Plan:    Patient is currently not appropriate for discharge due to ongoing evaluation of elevated troponin.  2D echo pending.  Will need an ischemic evaluation once her respiratory status has improved per cardiology.     Consultants: Cardiology   Objective: Vitals:   05/12/19 1525 05/12/19 1940 05/12/19 2034 05/13/19 0341  BP: 131/79 128/76  127/77  Pulse: 70 79  68  Resp: 20 (!) 22  18  Temp: 98 F (36.7 C) 98.6 F (37 C)  98.1 F (36.7 C)  TempSrc: Oral Oral  Oral  SpO2: 96% 98% 99% 98%  Weight:    53.7 kg  Height:        Intake/Output Summary (Last 24 hours) at 05/13/2019 0815 Last data filed at 05/12/2019 2000 Gross per 24 hour  Intake 660 ml  Output 950 ml  Net -290 ml   Filed Weights   05/11/19 0517 05/12/19 0608 05/13/19 0341  Weight: 52.7 kg 53.5 kg 53.7 kg    Exam:  . General: 85 y.o. year-old female well-developed well-nourished in no acute distress.  Alert oriented x3.   . Cardiovascular: Regular rate and rhythm no rubs or gallops no JVD or thyromegaly. Marland Kitchen. Respiratory: No  wheezing no rales.  Poor inspiratory effort.   . Abdomen: Soft nontender nondistended.  Normal bowel sounds present. . Musculoskeletal: No lower extremity edema.  2 out of 4 pulses in all 4 extremities. Marland Kitchen. Psychiatry: Mood is appropriate for condition and setting.   Data Reviewed: CBC: Recent Labs  Lab 05/10/19 1303 05/11/19 0458 05/12/19 0444  WBC 22.2* 10.3 11.4*  NEUTROABS 12.6*  --   --   HGB 13.9 13.4 12.1  HCT 46.3* 41.5 37.7  MCV 98.7 94.3 95.9  PLT 320 169 152   Basic Metabolic Panel: Recent Labs  Lab 05/10/19 1303 05/11/19 0458  NA 138 141  K 4.3 4.7  CL 104 104  CO2 23 25  GLUCOSE 195* 141*  BUN 27* 24*  CREATININE 0.78 0.89  CALCIUM 8.6* 9.1   GFR: Estimated Creatinine Clearance: 32.3 mL/min (by C-G formula based on SCr of 0.89 mg/dL). Liver Function Tests: Recent Labs  Lab 05/10/19 1303  AST 38  ALT 32  ALKPHOS 65  BILITOT 0.5  PROT 7.3  ALBUMIN 4.0   No results for input(s): LIPASE, AMYLASE in the last 168 hours. No results for input(s): AMMONIA in the last 168 hours. Coagulation Profile: No results for input(s): INR, PROTIME in the last 168 hours. Cardiac Enzymes: No results for input(s): CKTOTAL, CKMB, CKMBINDEX, TROPONINI in the last 168 hours. BNP (last 3 results) No results for input(s): PROBNP in the last 8760 hours. HbA1C: No results for input(s): HGBA1C in the last 72 hours. CBG: No results for input(s): GLUCAP in the last 168 hours. Lipid Profile: No results for input(s): CHOL, HDL, LDLCALC, TRIG, CHOLHDL, LDLDIRECT in the last 72 hours. Thyroid Function Tests: No results for input(s): TSH, T4TOTAL, FREET4, T3FREE, THYROIDAB in the last 72 hours. Anemia Panel: No results for input(s): VITAMINB12, FOLATE, FERRITIN, TIBC, IRON, RETICCTPCT in the last 72 hours. Urine analysis:    Component Value Date/Time   COLORURINE YELLOW 05/10/2019 1741   APPEARANCEUR CLEAR 05/10/2019 1741   APPEARANCEUR Clear 02/22/2019 1546   LABSPEC  >1.046 (H) 05/10/2019 1741   PHURINE 5.0 05/10/2019 1741   GLUCOSEU NEGATIVE 05/10/2019 1741   HGBUR SMALL (A) 05/10/2019 1741   BILIRUBINUR NEGATIVE 05/10/2019 1741   BILIRUBINUR Negative 02/22/2019 1546   KETONESUR 20 (A) 05/10/2019 1741   PROTEINUR 100 (A) 05/10/2019 1741   UROBILINOGEN negative 01/05/2015 1225   NITRITE NEGATIVE 05/10/2019 1741   LEUKOCYTESUR NEGATIVE 05/10/2019 1741   Sepsis Labs: @LABRCNTIP (procalcitonin:4,lacticidven:4)  ) Recent Results (from the past 240 hour(s))  SARS Coronavirus 2 [facility](Hospital order, Performed in [facility] hospital lab) Nasopharyngeal Nasopharyngeal Swab     Status: None   Collection Time: 05/10/19  1:04 PM   Specimen: Nasopharyngeal Swab  Result Value Ref Range Status   SARS Coronavirus 2 NEGATIVE NEGATIVE Final    Comment: (NOTE) If result is NEGATIVE SARS-CoV-2 target nucleic acids are NOT DETECTED. The SARS-CoV-2 RNA is generally detectable in upper and lower  respiratory specimens during the acute phase of infection. The lowest  concentration of SARS-CoV-2 viral copies this assay can detect is 250  copies / mL. A negative result does  not preclude SARS-CoV-2 infection  and should not be used as the sole basis for treatment or other  patient management decisions.  A negative result may occur with  improper specimen collection / handling, submission of specimen other  than nasopharyngeal swab, presence of viral mutation(s) within the  areas targeted by this assay, and inadequate number of viral copies  (<250 copies / mL). A negative result must be combined with clinical  observations, patient history, and epidemiological information. If result is POSITIVE SARS-CoV-2 target nucleic acids are DETECTED. The SARS-CoV-2 RNA is generally detectable in upper and lower  respiratory specimens dur ing the acute phase of infection.  Positive  results are indicative of active infection with SARS-CoV-2.  Clinical  correlation with patient  history and other diagnostic information is  necessary to determine patient infection status.  Positive results do  not rule out bacterial infection or co-infection with other viruses. If result is PRESUMPTIVE POSTIVE SARS-CoV-2 nucleic acids MAY BE PRESENT.   A presumptive positive result was obtained on the submitted specimen  and confirmed on repeat testing.  While 2019 novel coronavirus  (SARS-CoV-2) nucleic acids may be present in the submitted sample  additional confirmatory testing may be necessary for epidemiological  and / or clinical management purposes  to differentiate between  SARS-CoV-2 and other Sarbecovirus currently known to infect humans.  If clinically indicated additional testing with an alternate test  methodology [phone]) is advised. The SARS-CoV-2 RNA is generally  detectable in upper and lower respiratory sp ecimens during the acute  phase of infection. The expected result is Negative. Fact Sheet for Patients:  BoilerBrush.com.cy Fact Sheet for Healthcare Providers: https://pope.com/ This test is not yet approved or cleared by the [place] FDA and has been authorized for detection and/or diagnosis of SARS-CoV-2 by FDA under an Emergency Use Authorization (EUA).  This EUA will remain in effect (meaning this test can be used) for the duration of the COVID-19 declaration under Section 564(b)(1) of the Act, 21 U.S.C. section 360bbb-3(b)(1), unless the authorization is terminated or revoked sooner. Performed at [facility], [address] [place], [place] [address]   Blood culture (routine x 2)     Status: None (Preliminary result)   Collection Time: 05/10/19  3:34 PM   Specimen: Right Antecubital; Blood  Result Value Ref Range Status   Specimen Description RIGHT ANTECUBITAL  Final   Special Requests   Final    BOTTLES DRAWN AEROBIC AND ANAEROBIC Blood Culture adequate volume   Culture   Final    NO GROWTH 3  DAYS Performed at [facility], [address] [place], [place] [address]    Report Status PENDING  Incomplete  Blood culture (routine x 2)     Status: None (Preliminary result)   Collection Time: 05/10/19  3:41 PM   Specimen: BLOOD RIGHT HAND  Result Value Ref Range Status   Specimen Description BLOOD RIGHT HAND  Final   Special Requests   Final    BOTTLES DRAWN AEROBIC ONLY Blood Culture results may not be optimal due to an inadequate volume of blood received in culture bottles   Culture   Final    NO GROWTH 3 DAYS Performed at [facility], [address], [place], [place] [address]    Report Status PENDING  Incomplete      Studies: Dg Chest Port 1 View  Result Date: 05/12/2019 CLINICAL DATA:  Shortness of breath and lethargy EXAM: PORTABLE CHEST 1 VIEW COMPARISON:  05/10/2019, 03/04/2019 FINDINGS:  Hyperinflated lungs with emphysematous disease. Mild scarring at the bases. No consolidation or effusion. Stable cardiomediastinal silhouette with aortic atherosclerosis. Probable skin fold artifact over the right upper and lower chest. IMPRESSION: 1. Hyperinflated lungs with emphysematous disease. No focal pulmonary infiltrate. 2. Suspected skin fold artifact over the right upper lung, no definitive pneumothorax seen. Electronically Signed   By: Donavan Foil M.D.   On: 05/12/2019 16:12    Scheduled Meds: . aspirin  81 mg Oral Daily  . atorvastatin  10 mg Oral q1800  . benazepril  20 mg Oral Daily  . isosorbide mononitrate  15 mg Oral Daily  . levothyroxine  75 mcg Oral Daily  . loratadine  10 mg Oral Daily  . methylPREDNISolone (SOLU-MEDROL) injection  60 mg Intravenous Q12H  . mometasone-formoterol  2 puff Inhalation BID  . pantoprazole  40 mg Oral Q0600  . polyethylene glycol  17 g Oral Daily    Continuous Infusions: . cefTRIAXone (ROCEPHIN)  IV 1 g (05/12/19 1528)  . heparin 800 Units/hr (05/12/19 0338)     LOS: 3 days     Kayleen Memos, MD [facility] Pager  [phone]  If 7PM-7AM, please contact night-coverage [email or web address] Password [facility] 05/13/2019, 8:15 AM

## 2019-05-13 NOTE — Evaluation (Signed)
Physical Therapy Evaluation Patient Details Name: Amy Chambers MRN: [id number] DOB: 1928-05-20 Today's Date: 05/13/2019   History of Present Illness  85 yo female with onset of hypoxia, hypercapnia and acute respiratory failure was admitted, may have been a medication reaction.  Cleared for PE, Covid (-).  PMHx:  thyroid disease, COPD, HTN, atherosclerosis carotids, bronchitis  Clinical Impression  Pt was seen for PT evaluation and note her control of O2 with gait but replaced O2 as pt is fluctuating with cannula off.  Her cardiologist was in to let pt and daughter know catheterization is tomorrow.  Worked on posture in sitting to get comfortable in chair, and replaced cannula, talked with pt about having home therapy to increase her strength and control of balance to avoid a fall and undue stress on her system.  Follow acutely for these needs as well.    Follow Up Recommendations Home health PT;Supervision for mobility/OOB    Equipment Recommendations  Rolling walker with 5" wheels(if pt does not have a comfortable walker in good repair)    Recommendations for Other Services       Precautions / Restrictions Precautions Precautions: Fall Precaution Comments: monitor for O2 sats and pulses Restrictions Weight Bearing Restrictions: No      Mobility  Bed Mobility Overal bed mobility: Needs Assistance Bed Mobility: Supine to Sit     Supine to sit: Min guard     General bed mobility comments: min guard for safety and to manage all her lines  Transfers Overall transfer level: Needs assistance Equipment used: Rolling walker (2 wheeled);1 person hand held assist Transfers: Sit to/from Stand Sit to Stand: Min guard         General transfer comment: cues for hand placement to avoid stressing her lines  Ambulation/Gait Ambulation/Gait assistance: Min guard Gait Distance (Feet): 50 Feet(30+20) Assistive device: Rolling walker (2 wheeled);1 person hand held assist Gait  Pattern/deviations: Step-through pattern;Decreased stride length;Wide base of support Gait velocity: reduced Gait velocity interpretation: <1.31 ft/sec, indicative of household ambulator General Gait Details: pt used RW to door then used HHA to chair with O2 sats being controlled, not below 89% but quick recovery, fluctuating so replaced cannula and notified nsg  Stairs            Wheelchair Mobility    Modified Rankin (Stroke Patients Only)       Balance Overall balance assessment: Needs assistance Sitting-balance support: Feet supported Sitting balance-Leahy Scale: Good     Standing balance support: Bilateral upper extremity supported;During functional activity Standing balance-Leahy Scale: Fair                               Pertinent Vitals/Pain Pain Assessment: No/denies pain    Home Living Family/patient expects to be discharged to:: Private residence Living Arrangements: Alone Available Help at Discharge: Family;Available PRN/intermittently   Home Access: Level entry     Home Layout: One level Home Equipment: Walker - 2 wheels Additional Comments: has been living alone, able to walk with no assist and no recent falls    Prior Function Level of Independence: Independent with assistive device(s)         Comments: mild SOB with longer walks     Hand Dominance   Dominant Hand: Right    Extremity/Trunk Assessment   Upper Extremity Assessment Upper Extremity Assessment: Overall WFL for tasks assessed    Lower Extremity Assessment Lower Extremity Assessment: Generalized weakness  Cervical / Trunk Assessment Cervical / Trunk Assessment: Kyphotic  Communication   Communication: No difficulties  Cognition Arousal/Alertness: Awake/alert Behavior During Therapy: WFL for tasks assessed/performed Overall Cognitive Status: Within Functional Limits for tasks assessed                                        General  Comments General comments (skin integrity, edema, etc.): pt was able to walk with RW and with HHA but both were on room air, maintained O2 sats at 89% or better    Exercises     Assessment/Plan    PT Assessment Patient needs continued PT services  PT Problem List Decreased strength;Decreased range of motion;Decreased activity tolerance;Decreased balance;Decreased mobility;Decreased coordination;Cardiopulmonary status limiting activity       PT Treatment Interventions DME instruction;Gait training;Functional mobility training;Therapeutic activities;Therapeutic exercise;Balance training;Neuromuscular re-education;Patient/family education    PT Goals (Current goals can be found in the Care Plan section)  Acute Rehab PT Goals Patient Stated Goal: to get home and feel stronger PT Goal Formulation: With patient/family Time For Goal Achievement: 05/20/19 Potential to Achieve Goals: Good    Frequency Min 3X/week   Barriers to discharge Decreased caregiver support home alone with new use of O2 at hosp    Co-evaluation               AM-PAC PT "6 Clicks" Mobility  Outcome Measure Help needed turning from your back to your side while in a flat bed without using bedrails?: None Help needed moving from lying on your back to sitting on the side of a flat bed without using bedrails?: A Little Help needed moving to and from a bed to a chair (including a wheelchair)?: A Little Help needed standing up from a chair using your arms (e.g., wheelchair or bedside chair)?: A Little Help needed to walk in hospital room?: A Little Help needed climbing 3-5 steps with a railing? : A Lot 6 Click Score: 18    End of Session Equipment Utilized During Treatment: Gait belt;Oxygen Activity Tolerance: Patient tolerated treatment well;Treatment limited secondary to medical complications (Comment) Patient left: in chair;with call bell/phone within reach;with chair alarm set;with family/visitor  present Nurse Communication: Mobility status PT Visit Diagnosis: Unsteadiness on feet (R26.81);Muscle weakness (generalized) (M62.81)    Time: 7096-2836 PT Time Calculation (min) (ACUTE ONLY): 23 min   Charges:   PT Evaluation $PT Eval Moderate Complexity: 1 Mod PT Treatments $Gait Training: 8-22 mins       Ivar Drape 05/13/2019, 4:50 PM   Samul Dada, PT MS Acute Rehab Dept. Number: [facility] [id number] and [facility] [phone]

## 2019-05-14 ENCOUNTER — Inpatient Hospital Stay (HOSPITAL_COMMUNITY): Payer: Medicare Other | Admitting: Anesthesiology

## 2019-05-14 ENCOUNTER — Encounter (HOSPITAL_COMMUNITY): Payer: Self-pay

## 2019-05-14 ENCOUNTER — Encounter (HOSPITAL_COMMUNITY)
Admission: EM | Disposition: A | Discharge status: Skilled Nursing Facility | Payer: Self-pay | Source: Home / Self Care | Attending: Cardiovascular Disease

## 2019-05-14 ENCOUNTER — Inpatient Hospital Stay (HOSPITAL_COMMUNITY): Payer: Medicare Other

## 2019-05-14 ENCOUNTER — Other Ambulatory Visit: Payer: Self-pay

## 2019-05-14 DIAGNOSIS — Z9861 Coronary angioplasty status: Secondary | ICD-10-CM

## 2019-05-14 DIAGNOSIS — I313 Pericardial effusion (noninflammatory): Secondary | ICD-10-CM

## 2019-05-14 DIAGNOSIS — I312 Hemopericardium, not elsewhere classified: Secondary | ICD-10-CM

## 2019-05-14 DIAGNOSIS — I2510 Atherosclerotic heart disease of native coronary artery without angina pectoris: Secondary | ICD-10-CM

## 2019-05-14 DIAGNOSIS — I314 Cardiac tamponade: Secondary | ICD-10-CM | POA: Diagnosis not present

## 2019-05-14 DIAGNOSIS — I230 Hemopericardium as current complication following acute myocardial infarction: Secondary | ICD-10-CM | POA: Diagnosis not present

## 2019-05-14 DIAGNOSIS — Z955 Presence of coronary angioplasty implant and graft: Secondary | ICD-10-CM

## 2019-05-14 HISTORY — PX: MEDIASTINAL EXPLORATION: SHX5065

## 2019-05-14 HISTORY — PX: LEFT HEART CATH AND CORONARY ANGIOGRAPHY: CATH118249

## 2019-05-14 HISTORY — PX: CORONARY STENT INTERVENTION: CATH118234

## 2019-05-14 LAB — BASIC METABOLIC PANEL
Anion gap: 7 (ref 5–15)
Anion gap: 8 (ref 5–15)
BUN: 37 mg/dL — ABNORMAL HIGH (ref 8–23)
BUN: 28 mg/dL — ABNORMAL HIGH (ref 8–23)
CO2: 27 mmol/L (ref 22–32)
CO2: 21 mmol/L — ABNORMAL LOW (ref 22–32)
Calcium: 8.2 mg/dL — ABNORMAL LOW (ref 8.9–10.3)
Calcium: 7.7 mg/dL — ABNORMAL LOW (ref 8.9–10.3)
Chloride: 105 mmol/L (ref 98–111)
Chloride: 106 mmol/L (ref 98–111)
Creatinine, Ser: 0.7 mg/dL (ref 0.44–1.00)
Creatinine, Ser: 0.66 mg/dL (ref 0.44–1.00)
GFR calc Af Amer: >60 mL/min (ref >60)
GFR calc Af Amer: >60 mL/min (ref >60)
GFR calc non Af Amer: >60 mL/min (ref >60)
GFR calc non Af Amer: >60 mL/min (ref >60)
Glucose, Bld: 152 mg/dL — ABNORMAL HIGH (ref 70–99)
Glucose, Bld: 275 mg/dL — ABNORMAL HIGH (ref 70–99)
Potassium: 4.2 mmol/L (ref 3.5–5.1)
Potassium: 4.5 mmol/L (ref 3.5–5.1)
Sodium: 139 mmol/L (ref 135–145)
Sodium: 135 mmol/L (ref 135–145)

## 2019-05-14 LAB — MRSA PCR SCREENING: MRSA by PCR: NEGATIVE

## 2019-05-14 LAB — HEPARIN LEVEL (UNFRACTIONATED): Heparin Unfractionated: 0.7 IU/mL (ref 0.30–0.70)

## 2019-05-14 LAB — POCT I-STAT 7, (LYTES, BLD GAS, ICA,H+H)
Acid-base deficit: 11 mmol/L — ABNORMAL HIGH (ref 0.0–2.0)
Acid-base deficit: 12 mmol/L — ABNORMAL HIGH (ref 0.0–2.0)
Bicarbonate: 13.9 mmol/L — ABNORMAL LOW (ref 20.0–28.0)
Bicarbonate: 14.3 mmol/L — ABNORMAL LOW (ref 20.0–28.0)
Calcium, Ion: 0.98 mmol/L — ABNORMAL LOW (ref 1.15–1.40)
Calcium, Ion: 1.03 mmol/L — ABNORMAL LOW (ref 1.15–1.40)
HCT: 25 % — ABNORMAL LOW (ref 36.0–46.0)
HCT: 25 % — ABNORMAL LOW (ref 36.0–46.0)
Hemoglobin: 8.5 g/dL — ABNORMAL LOW (ref 12.0–15.0)
Hemoglobin: 8.5 g/dL — ABNORMAL LOW (ref 12.0–15.0)
O2 Saturation: 96 %
O2 Saturation: 98 %
Patient temperature: 34.9
Patient temperature: 36.3
Potassium: 4.6 mmol/L (ref 3.5–5.1)
Potassium: 4.8 mmol/L (ref 3.5–5.1)
Sodium: 139 mmol/L (ref 135–145)
Sodium: 142 mmol/L (ref 135–145)
TCO2: 15 mmol/L — ABNORMAL LOW (ref 22–32)
TCO2: 15 mmol/L — ABNORMAL LOW (ref 22–32)
pCO2 arterial: 27.6 mmHg — ABNORMAL LOW (ref 32.0–48.0)
pCO2 arterial: 29.8 mmHg — ABNORMAL LOW (ref 32.0–48.0)
pH, Arterial: 7.286 — ABNORMAL LOW (ref 7.350–7.450)
pH, Arterial: 7.297 — ABNORMAL LOW (ref 7.350–7.450)
pO2, Arterial: 108 mmHg (ref 83.0–108.0)
pO2, Arterial: 81 mmHg — ABNORMAL LOW (ref 83.0–108.0)

## 2019-05-14 LAB — POCT I-STAT EG7
Acid-base deficit: 6 mmol/L — ABNORMAL HIGH (ref 0.0–2.0)
Bicarbonate: 21.9 mmol/L (ref 20.0–28.0)
Calcium, Ion: 1.09 mmol/L — ABNORMAL LOW (ref 1.15–1.40)
HCT: 29 % — ABNORMAL LOW (ref 36.0–46.0)
Hemoglobin: 9.9 g/dL — ABNORMAL LOW (ref 12.0–15.0)
O2 Saturation: 46 %
Potassium: 4.8 mmol/L (ref 3.5–5.1)
Sodium: 138 mmol/L (ref 135–145)
TCO2: 23 mmol/L (ref 22–32)
pCO2, Ven: 51.5 mmHg (ref 44.0–60.0)
pH, Ven: 7.237 — ABNORMAL LOW (ref 7.250–7.430)
pO2, Ven: 30 mmHg — CL (ref 32.0–45.0)

## 2019-05-14 LAB — CBC
HCT: 35.2 % — ABNORMAL LOW (ref 36.0–46.0)
HCT: 32.3 % — ABNORMAL LOW (ref 36.0–46.0)
Hemoglobin: 10.9 g/dL — ABNORMAL LOW (ref 12.0–15.0)
Hemoglobin: 10.5 g/dL — ABNORMAL LOW (ref 12.0–15.0)
MCH: 29.7 pg (ref 26.0–34.0)
MCH: 30.7 pg (ref 26.0–34.0)
MCHC: 31 g/dL (ref 30.0–36.0)
MCHC: 32.5 g/dL (ref 30.0–36.0)
MCV: 95.9 fL (ref 80.0–100.0)
MCV: 94.4 fL (ref 80.0–100.0)
Platelets: 137 10*3/uL — ABNORMAL LOW (ref 150–400)
Platelets: 306 10*3/uL (ref 150–400)
RBC: 3.67 MIL/uL — ABNORMAL LOW (ref 3.87–5.11)
RBC: 3.42 MIL/uL — ABNORMAL LOW (ref 3.87–5.11)
RDW: 13.4 % (ref 11.5–15.5)
RDW: 13.5 % (ref 11.5–15.5)
WBC: 6.8 10*3/uL (ref 4.0–10.5)
WBC: 12.2 10*3/uL — ABNORMAL HIGH (ref 4.0–10.5)
nRBC: 0 % (ref 0.0–0.2)
nRBC: 0 % (ref 0.0–0.2)

## 2019-05-14 LAB — POCT I-STAT, CHEM 8
BUN: 30 mg/dL — ABNORMAL HIGH (ref 8–23)
Calcium, Ion: 1.14 mmol/L — ABNORMAL LOW (ref 1.15–1.40)
Chloride: 105 mmol/L (ref 98–111)
Creatinine, Ser: 0.6 mg/dL (ref 0.44–1.00)
Glucose, Bld: 271 mg/dL — ABNORMAL HIGH (ref 70–99)
HCT: 31 % — ABNORMAL LOW (ref 36.0–46.0)
Hemoglobin: 10.5 g/dL — ABNORMAL LOW (ref 12.0–15.0)
Potassium: 4.5 mmol/L (ref 3.5–5.1)
Sodium: 137 mmol/L (ref 135–145)
TCO2: 24 mmol/L (ref 22–32)

## 2019-05-14 LAB — ECHOCARDIOGRAM COMPLETE
Height: 61 in
Weight: 1858.92 oz

## 2019-05-14 LAB — PROTIME-INR
INR: 2.2 — ABNORMAL HIGH (ref 0.8–1.2)
Prothrombin Time: 24.4 seconds — ABNORMAL HIGH (ref 11.4–15.2)

## 2019-05-14 LAB — ABO/RH: ABO/RH(D): A POS

## 2019-05-14 LAB — PREPARE RBC (CROSSMATCH)

## 2019-05-14 LAB — POCT ACTIVATED CLOTTING TIME: Activated Clotting Time: 428 seconds

## 2019-05-14 SURGERY — LEFT HEART CATH AND CORONARY ANGIOGRAPHY
Anesthesia: LOCAL

## 2019-05-14 SURGERY — EXPLORATION, MEDIASTINUM
Anesthesia: General | Site: Chest

## 2019-05-14 MED ORDER — HEPARIN (PORCINE) IN NACL 1000-0.9 UT/500ML-% IV SOLN
INTRAVENOUS | Status: DC | PRN
  Administered 2019-05-14 (×3): 500 mL

## 2019-05-14 MED ORDER — SODIUM BICARBONATE 8.4 % IV SOLN
INTRAVENOUS | Status: DC | PRN
  Administered 2019-05-14: 50 meq via INTRAVENOUS

## 2019-05-14 MED ORDER — LIDOCAINE HCL (PF) 1 % IJ SOLN
INTRAMUSCULAR | Status: AC
  Filled 2019-05-14: qty 30

## 2019-05-14 MED ORDER — SODIUM CHLORIDE 0.9 % IV SOLN: INTRAVENOUS | Status: DC

## 2019-05-14 MED ORDER — NITROGLYCERIN IN D5W 200-5 MCG/ML-% IV SOLN
2.0000 ug/min | INTRAVENOUS | Status: DC
  Filled 2019-05-14: qty 250

## 2019-05-14 MED ORDER — FENTANYL CITRATE (PF) 100 MCG/2ML IJ SOLN
INTRAMUSCULAR | Status: AC
  Filled 2019-05-14: qty 2

## 2019-05-14 MED ORDER — DOPAMINE-DEXTROSE 3.2-5 MG/ML-% IV SOLN
0.0000 ug/kg/min | INTRAVENOUS | Status: DC
  Filled 2019-05-14: qty 250

## 2019-05-14 MED ORDER — SODIUM CHLORIDE 0.9% FLUSH
10.0000 mL | Freq: Two times a day (BID) | INTRAVENOUS | Status: DC
  Administered 2019-05-15 – 2019-05-21 (×10): 10 mL

## 2019-05-14 MED ORDER — NITROGLYCERIN 1 MG/10 ML FOR IR/CATH LAB
INTRA_ARTERIAL | Status: AC
  Filled 2019-05-14: qty 10

## 2019-05-14 MED ORDER — HEPARIN SODIUM (PORCINE) 1000 UNIT/ML IJ SOLN
INTRAMUSCULAR | Status: AC
  Filled 2019-05-14: qty 1

## 2019-05-14 MED ORDER — SODIUM CHLORIDE 0.9 % IV SOLN
1.5000 g | INTRAVENOUS | Status: DC
  Filled 2019-05-14 (×2): qty 1.5

## 2019-05-14 MED ORDER — TRANEXAMIC ACID (OHS) BOLUS VIA INFUSION
15.0000 mg/kg | INTRAVENOUS | Status: DC
  Filled 2019-05-14: qty 858

## 2019-05-14 MED ORDER — SODIUM CHLORIDE 0.9 % IV SOLN
750.0000 mg | INTRAVENOUS | Status: DC
  Filled 2019-05-14 (×2): qty 750

## 2019-05-14 MED ORDER — FENTANYL CITRATE (PF) 100 MCG/2ML IJ SOLN: 25.0000 ug | Freq: Once | INTRAMUSCULAR | Status: DC

## 2019-05-14 MED ORDER — ACETAMINOPHEN 325 MG PO TABS
ORAL_TABLET | ORAL | Status: AC
  Filled 2019-05-14: qty 2

## 2019-05-14 MED ORDER — SODIUM CHLORIDE 0.9 % IV SOLN: 250.0000 mL | INTRAVENOUS | Status: DC | PRN

## 2019-05-14 MED ORDER — BIVALIRUDIN TRIFLUOROACETATE 250 MG IV SOLR
INTRAVENOUS | Status: AC
  Filled 2019-05-14: qty 250

## 2019-05-14 MED ORDER — ASPIRIN 81 MG PO CHEW: 81.0000 mg | CHEWABLE_TABLET | Freq: Every day | ORAL | Status: DC

## 2019-05-14 MED ORDER — TICAGRELOR 90 MG PO TABS
ORAL_TABLET | ORAL | Status: DC | PRN
  Administered 2019-05-14: 180 mg via ORAL

## 2019-05-14 MED ORDER — INSULIN REGULAR(HUMAN) IN NACL 100-0.9 UT/100ML-% IV SOLN
INTRAVENOUS | Status: DC
  Filled 2019-05-14 (×2): qty 100

## 2019-05-14 MED ORDER — FENTANYL CITRATE (PF) 100 MCG/2ML IJ SOLN
INTRAMUSCULAR | Status: DC | PRN
  Administered 2019-05-14 (×2): 25 ug via INTRAVENOUS

## 2019-05-14 MED ORDER — POTASSIUM CHLORIDE 2 MEQ/ML IV SOLN
80.0000 meq | INTRAVENOUS | Status: DC
  Filled 2019-05-14 (×2): qty 40

## 2019-05-14 MED ORDER — EPINEPHRINE HCL 5 MG/250ML IV SOLN IN NS
0.0000 ug/min | INTRAVENOUS | Status: DC
  Filled 2019-05-14: qty 250

## 2019-05-14 MED ORDER — ONDANSETRON HCL 4 MG/2ML IJ SOLN
4.0000 mg | Freq: Four times a day (QID) | INTRAMUSCULAR | Status: DC | PRN
  Administered 2019-05-14: 18:00:00 4 mg via INTRAVENOUS

## 2019-05-14 MED ORDER — FENTANYL CITRATE (PF) 100 MCG/2ML IJ SOLN
INTRAMUSCULAR | Status: DC | PRN
  Administered 2019-05-14 (×4): 25 ug via INTRAVENOUS

## 2019-05-14 MED ORDER — ATORVASTATIN CALCIUM 40 MG PO TABS
40.0000 mg | ORAL_TABLET | Freq: Every day | ORAL | Status: DC
  Administered 2019-05-15 – 2019-05-30 (×13): 40 mg via ORAL
  Filled 2019-05-14 (×14): qty 1

## 2019-05-14 MED ORDER — TICAGRELOR 90 MG PO TABS: 90.0000 mg | ORAL_TABLET | Freq: Two times a day (BID) | ORAL | Status: DC

## 2019-05-14 MED ORDER — SODIUM BICARBONATE 8.4 % IV SOLN
INTRAVENOUS | Status: AC
  Filled 2019-05-14: qty 50

## 2019-05-14 MED ORDER — BIVALIRUDIN BOLUS VIA INFUSION - CUPID
INTRAVENOUS | Status: DC | PRN
  Administered 2019-05-14: 42.9 mg via INTRAVENOUS

## 2019-05-14 MED ORDER — HYDRALAZINE HCL 20 MG/ML IJ SOLN: 10.0000 mg | INTRAMUSCULAR | Status: DC | PRN

## 2019-05-14 MED ORDER — IOHEXOL 350 MG/ML SOLN
INTRAVENOUS | Status: DC | PRN
  Administered 2019-05-14: 70 mL via INTRA_ARTERIAL

## 2019-05-14 MED ORDER — TRANEXAMIC ACID 1000 MG/10ML IV SOLN
1.5000 mg/kg/h | INTRAVENOUS | Status: DC
  Filled 2019-05-14 (×2): qty 25

## 2019-05-14 MED ORDER — SODIUM CHLORIDE 0.9 % IV BOLUS: 1000.0000 mL | Freq: Once | INTRAVENOUS | Status: DC

## 2019-05-14 MED ORDER — VERAPAMIL HCL 2.5 MG/ML IV SOLN
INTRAVENOUS | Status: AC
  Filled 2019-05-14: qty 2

## 2019-05-14 MED ORDER — MANNITOL 20 % IV SOLN
Freq: Once | INTRAVENOUS | Status: DC
  Filled 2019-05-14 (×2): qty 13

## 2019-05-14 MED ORDER — HEPARIN (PORCINE) IN NACL 1000-0.9 UT/500ML-% IV SOLN
INTRAVENOUS | Status: AC
  Filled 2019-05-14: qty 1500

## 2019-05-14 MED ORDER — PREDNISONE 20 MG PO TABS: 40.0000 mg | ORAL_TABLET | Freq: Every day | ORAL | Status: DC

## 2019-05-14 MED ORDER — VANCOMYCIN HCL 10 G IV SOLR
1250.0000 mg | INTRAVENOUS | Status: DC
  Filled 2019-05-14 (×2): qty 1250

## 2019-05-14 MED ORDER — ONDANSETRON HCL 4 MG/2ML IJ SOLN
INTRAMUSCULAR | Status: AC
  Filled 2019-05-14: qty 2

## 2019-05-14 MED ORDER — PLASMA-LYTE 148 IV SOLN
INTRAVENOUS | Status: DC
  Filled 2019-05-14 (×2): qty 2.5

## 2019-05-14 MED ORDER — SODIUM CHLORIDE 0.9% FLUSH: 10.0000 mL | INTRAVENOUS | Status: DC | PRN

## 2019-05-14 MED ORDER — CEFAZOLIN SODIUM-DEXTROSE 2-4 GM/100ML-% IV SOLN
2.0000 g | Freq: Once | INTRAVENOUS | Status: AC
  Administered 2019-05-14: 22:00:00 2 g via INTRAVENOUS
  Filled 2019-05-14: qty 100

## 2019-05-14 MED ORDER — SODIUM CHLORIDE 0.9% FLUSH: 3.0000 mL | INTRAVENOUS | Status: DC | PRN

## 2019-05-14 MED ORDER — PHENYLEPHRINE HCL-NACL 20-0.9 MG/250ML-% IV SOLN
30.0000 ug/min | INTRAVENOUS | Status: DC
  Filled 2019-05-14 (×2): qty 250

## 2019-05-14 MED ORDER — ATORVASTATIN CALCIUM 40 MG PO TABS: 40.0000 mg | ORAL_TABLET | Freq: Every day | ORAL | Status: DC

## 2019-05-14 MED ORDER — IOHEXOL 350 MG/ML SOLN
100.0000 mL | Freq: Once | INTRAVENOUS | Status: AC | PRN
  Administered 2019-05-14: 21:00:00 100 mL via INTRAVENOUS

## 2019-05-14 MED ORDER — DEXMEDETOMIDINE HCL IN NACL 400 MCG/100ML IV SOLN
0.1000 ug/kg/h | INTRAVENOUS | Status: DC
  Filled 2019-05-14 (×2): qty 100

## 2019-05-14 MED ORDER — IOHEXOL 350 MG/ML SOLN
INTRAVENOUS | Status: DC | PRN
  Administered 2019-05-14: 235 mL

## 2019-05-14 MED ORDER — MIDAZOLAM HCL 2 MG/2ML IJ SOLN
INTRAMUSCULAR | Status: AC
  Filled 2019-05-14: qty 2

## 2019-05-14 MED ORDER — DIAZEPAM 2 MG PO TABS: 2.0000 mg | ORAL_TABLET | ORAL | Status: DC | PRN

## 2019-05-14 MED ORDER — PROPOFOL 10 MG/ML IV BOLUS
INTRAVENOUS | Status: AC
  Filled 2019-05-14: qty 20

## 2019-05-14 MED ORDER — NOREPINEPHRINE 4 MG/250ML-% IV SOLN
4.0000 ug/min | INTRAVENOUS | Status: DC
  Administered 2019-05-15: 05:00:00 4 ug/min via INTRAVENOUS
  Administered 2019-05-15: 12:00:00 8 ug/min via INTRAVENOUS
  Administered 2019-05-16: 08:00:00 4 ug/min via INTRAVENOUS
  Filled 2019-05-14 (×3): qty 250

## 2019-05-14 MED ORDER — LIDOCAINE HCL (PF) 1 % IJ SOLN
INTRAMUSCULAR | Status: DC | PRN
  Administered 2019-05-14: 18 mL

## 2019-05-14 MED ORDER — SODIUM CHLORIDE 0.9% IV SOLUTION: Freq: Once | INTRAVENOUS | Status: DC

## 2019-05-14 MED ORDER — SODIUM CHLORIDE 0.9% FLUSH: 3.0000 mL | Freq: Two times a day (BID) | INTRAVENOUS | Status: DC

## 2019-05-14 MED ORDER — HEPARIN (PORCINE) IN NACL 1000-0.9 UT/500ML-% IV SOLN
INTRAVENOUS | Status: AC
  Filled 2019-05-14: qty 1000

## 2019-05-14 MED ORDER — MILRINONE LACTATE IN DEXTROSE 20-5 MG/100ML-% IV SOLN
0.3000 ug/kg/min | INTRAVENOUS | Status: DC
  Filled 2019-05-14 (×2): qty 100

## 2019-05-14 MED ORDER — NOREPINEPHRINE 4 MG/250ML-% IV SOLN
INTRAVENOUS | Status: AC
  Filled 2019-05-14: qty 250

## 2019-05-14 MED ORDER — NITROGLYCERIN 1 MG/10 ML FOR IR/CATH LAB
INTRA_ARTERIAL | Status: DC | PRN
  Administered 2019-05-14: 150 ug via INTRACORONARY
  Administered 2019-05-14 (×4): 200 ug via INTRACORONARY

## 2019-05-14 MED ORDER — TRANEXAMIC ACID (OHS) PUMP PRIME SOLUTION
2.0000 mg/kg | INTRAVENOUS | Status: DC
  Filled 2019-05-14: qty 1.14

## 2019-05-14 MED ORDER — SODIUM CHLORIDE 0.9 % IV SOLN
INTRAVENOUS | Status: DC | PRN
  Administered 2019-05-14 (×2): 1.75 mg/kg/h via INTRAVENOUS

## 2019-05-14 MED ORDER — ACETAMINOPHEN 325 MG PO TABS: 650.0000 mg | ORAL_TABLET | ORAL | Status: DC | PRN

## 2019-05-14 MED ORDER — LIDOCAINE HCL (PF) 1 % IJ SOLN
INTRAMUSCULAR | Status: DC | PRN
  Administered 2019-05-14: 15 mL via INTRADERMAL
  Administered 2019-05-14: 2 mL via INTRADERMAL

## 2019-05-14 MED ORDER — TICAGRELOR 90 MG PO TABS
ORAL_TABLET | ORAL | Status: AC
  Filled 2019-05-14: qty 2

## 2019-05-14 MED ORDER — FENTANYL CITRATE (PF) 250 MCG/5ML IJ SOLN
INTRAMUSCULAR | Status: AC
  Filled 2019-05-14: qty 5

## 2019-05-14 MED ORDER — SODIUM CHLORIDE 0.9 % IV SOLN
INTRAVENOUS | Status: DC
  Filled 2019-05-14 (×2): qty 30

## 2019-05-14 MED ORDER — CHLORHEXIDINE GLUCONATE CLOTH 2 % EX PADS
6.0000 | MEDICATED_PAD | Freq: Every day | CUTANEOUS | Status: DC
  Administered 2019-05-15 – 2019-05-21 (×6): 6 via TOPICAL

## 2019-05-14 MED ORDER — ONDANSETRON HCL 4 MG/2ML IJ SOLN
INTRAMUSCULAR | Status: DC | PRN
  Administered 2019-05-14: 4 mg via INTRAVENOUS

## 2019-05-14 MED ORDER — MIDAZOLAM HCL 2 MG/2ML IJ SOLN
INTRAMUSCULAR | Status: DC | PRN
  Administered 2019-05-14 (×2): 0.5 mg via INTRAVENOUS

## 2019-05-14 MED ORDER — LABETALOL HCL 5 MG/ML IV SOLN: 10.0000 mg | INTRAVENOUS | Status: DC | PRN

## 2019-05-14 SURGICAL SUPPLY — 28 items
BALLN SAPPHIRE 2.0X10 (BALLOONS) ×2
BALLN SAPPHIRE 2.5X15 (BALLOONS) ×2
BALLN SAPPHIRE 3.0X12 (BALLOONS) ×2
BALLN SAPPHIRE ~~LOC~~ 3.5X15 (BALLOONS) ×1 IMPLANT
BALLOON SAPPHIRE 2.0X10 (BALLOONS) IMPLANT
BALLOON SAPPHIRE 2.5X15 (BALLOONS) IMPLANT
BALLOON SAPPHIRE 3.0X12 (BALLOONS) IMPLANT
CATH INFINITI 5FR MULTPACK ANG (CATHETERS) ×1 IMPLANT
CATH LAUNCHER 6FR JR4 (CATHETERS) ×1 IMPLANT
CATH VISTA GUIDE 6FR JR3.5 (CATHETERS) ×1 IMPLANT
DEVICE RAD COMP TR BAND LRG (VASCULAR PRODUCTS) ×1 IMPLANT
GLIDESHEATH SLEND SS 6F .021 (SHEATH) ×1 IMPLANT
GUIDELINER 6F (CATHETERS) ×1 IMPLANT
GUIDEWIRE INQWIRE 1.5J.035X260 (WIRE) IMPLANT
INQWIRE 1.5J .035X260CM (WIRE) ×2
KIT ENCORE 26 ADVANTAGE (KITS) ×1 IMPLANT
KIT HEART LEFT (KITS) ×2 IMPLANT
PACK CARDIAC CATHETERIZATION (CUSTOM PROCEDURE TRAY) ×2 IMPLANT
SHEATH PINNACLE 5F 10CM (SHEATH) ×1 IMPLANT
SHEATH PINNACLE 6F 10CM (SHEATH) ×1 IMPLANT
SHEATH PROBE COVER 6X72 (BAG) ×1 IMPLANT
STENT SYNERGY DES 3.5X20 (Permanent Stent) ×1 IMPLANT
STENT SYNERGY DES 3X12 (Permanent Stent) ×1 IMPLANT
TRANSDUCER W/STOPCOCK (MISCELLANEOUS) ×2 IMPLANT
TUBING CIL FLEX 10 FLL-RA (TUBING) ×2 IMPLANT
WIRE ASAHI PROWATER 180CM (WIRE) ×1 IMPLANT
WIRE EMERALD 3MM-J .035X150CM (WIRE) ×1 IMPLANT
WIRE PT2 MS 185 (WIRE) ×1 IMPLANT

## 2019-05-14 SURGICAL SUPPLY — 82 items
BAG DECANTER FOR FLEXI CONT (MISCELLANEOUS) ×2 IMPLANT
BASKET HEART  (ORDER IN 25'S) (MISCELLANEOUS)
BASKET HEART (ORDER IN 25'S) (MISCELLANEOUS)
BASKET HEART (ORDER IN 25S) (MISCELLANEOUS) ×1 IMPLANT
BLADE CLIPPER SURG (BLADE) ×1 IMPLANT
BLADE STERNUM SYSTEM 6 (BLADE) ×3 IMPLANT
BNDG ELASTIC 4X5.8 VLCR STR LF (GAUZE/BANDAGES/DRESSINGS) ×1 IMPLANT
BNDG ELASTIC 6X5.8 VLCR STR LF (GAUZE/BANDAGES/DRESSINGS) ×1 IMPLANT
BNDG GAUZE ELAST 4 BULKY (GAUZE/BANDAGES/DRESSINGS) ×1 IMPLANT
CANISTER SUCT 3000ML PPV (MISCELLANEOUS) ×3 IMPLANT
CANNULA EZ GLIDE 8.0 24FR (CANNULA) ×1 IMPLANT
CATH CPB KIT HENDRICKSON (MISCELLANEOUS) ×1 IMPLANT
CLIP RETRACTION 3.0MM CORONARY (MISCELLANEOUS) ×1 IMPLANT
CLIP VESOCCLUDE MED 24/CT (CLIP) IMPLANT
CLIP VESOCCLUDE SM WIDE 24/CT (CLIP) IMPLANT
CONN ST 1/2X1/2  BEN (MISCELLANEOUS) ×2
CONN ST 1/2X1/2 BEN (MISCELLANEOUS) ×1 IMPLANT
COVER WAND RF STERILE (DRAPES) ×3 IMPLANT
DRAIN CHANNEL 19F RND (DRAIN) ×4 IMPLANT
DRAPE CARDIOVASCULAR INCISE (DRAPES) ×3
DRAPE INCISE IOBAN 66X45 STRL (DRAPES) ×3 IMPLANT
DRAPE SLUSH/WARMER DISC (DRAPES) ×3 IMPLANT
DRAPE SRG 135X102X78XABS (DRAPES) ×1 IMPLANT
DRSG AQUACEL AG ADV 3.5X14 (GAUZE/BANDAGES/DRESSINGS) ×2 IMPLANT
DRSG COVADERM 4X14 (GAUZE/BANDAGES/DRESSINGS) ×1 IMPLANT
ELECT REM PT RETURN 9FT ADLT (ELECTROSURGICAL) ×6
ELECTRODE REM PT RTRN 9FT ADLT (ELECTROSURGICAL) ×2 IMPLANT
FELT TEFLON 1X6 (MISCELLANEOUS) ×6 IMPLANT
GAUZE SPONGE 4X4 12PLY STRL (GAUZE/BANDAGES/DRESSINGS) ×4 IMPLANT
GLOVE BIO SURGEON STRL SZ 6.5 (GLOVE) ×4 IMPLANT
GLOVE BIO SURGEON STRL SZ7 (GLOVE) ×8 IMPLANT
GLOVE BIO SURGEONS STRL SZ 6.5 (GLOVE) ×4
GLOVE BIOGEL PI IND STRL 7.5 (GLOVE) ×2 IMPLANT
GLOVE BIOGEL PI INDICATOR 7.5 (GLOVE) ×4
GOWN STRL REUS W/ TWL LRG LVL3 (GOWN DISPOSABLE) ×4 IMPLANT
GOWN STRL REUS W/ TWL XL LVL3 (GOWN DISPOSABLE) ×2 IMPLANT
GOWN STRL REUS W/TWL LRG LVL3 (GOWN DISPOSABLE) ×12
GOWN STRL REUS W/TWL XL LVL3 (GOWN DISPOSABLE) ×12
HEMOSTAT POWDER SURGIFOAM 1G (HEMOSTASIS) ×7 IMPLANT
KIT BASIN OR (CUSTOM PROCEDURE TRAY) ×3 IMPLANT
KIT SUCTION CATH 14FR (SUCTIONS) ×9 IMPLANT
KIT TURNOVER KIT B (KITS) ×3 IMPLANT
KIT VASOVIEW HEMOPRO 2 VH 4000 (KITS) ×1 IMPLANT
LEAD PACING MYOCARDI (MISCELLANEOUS) ×3 IMPLANT
MARKER GRAFT CORONARY BYPASS (MISCELLANEOUS) ×9 IMPLANT
NS IRRIG 1000ML POUR BTL (IV SOLUTION) ×13 IMPLANT
PACK E OPEN HEART (SUTURE) ×3 IMPLANT
PACK OPEN HEART (CUSTOM PROCEDURE TRAY) ×3 IMPLANT
PAD ARMBOARD 7.5X6 YLW CONV (MISCELLANEOUS) ×6 IMPLANT
PAD ELECT DEFIB RADIOL ZOLL (MISCELLANEOUS) ×3 IMPLANT
PENCIL BUTTON HOLSTER BLD 10FT (ELECTRODE) ×1 IMPLANT
POSITIONER HEAD DONUT 9IN (MISCELLANEOUS) ×3 IMPLANT
PUNCH AORTIC ROTATE 4.0MM (MISCELLANEOUS) IMPLANT
PUNCH AORTIC ROTATE 4.5MM 8IN (MISCELLANEOUS) IMPLANT
PUNCH AORTIC ROTATE 5MM 8IN (MISCELLANEOUS) IMPLANT
SPONGE LAP 18X18 RF (DISPOSABLE) IMPLANT
SPONGE LAP 4X18 RFD (DISPOSABLE) IMPLANT
SUT BONE WAX W31G (SUTURE) ×1 IMPLANT
SUT MNCRL AB 3-0 PS2 18 (SUTURE) ×6 IMPLANT
SUT PDS AB 1 CTX 36 (SUTURE) ×6 IMPLANT
SUT PROLENE 2 0 SH DA (SUTURE) IMPLANT
SUT PROLENE 3 0 SH DA (SUTURE) ×3 IMPLANT
SUT PROLENE 3 0 SH1 36 (SUTURE) IMPLANT
SUT PROLENE 4 0 RB 1 (SUTURE)
SUT PROLENE 4 0 SH DA (SUTURE) IMPLANT
SUT PROLENE 4-0 RB1 .5 CRCL 36 (SUTURE) IMPLANT
SUT PROLENE 5 0 C 1 36 (SUTURE) ×9 IMPLANT
SUT PROLENE 6 0 C 1 30 (SUTURE) IMPLANT
SUT PROLENE 8 0 BV175 6 (SUTURE) IMPLANT
SUT PROLENE BLUE 7 0 (SUTURE) ×1 IMPLANT
SUT PROLENE POLY MONO (SUTURE) IMPLANT
SUT STEEL 6MS V (SUTURE) ×10 IMPLANT
SYSTEM SAHARA CHEST DRAIN ATS (WOUND CARE) ×3 IMPLANT
TAPE CLOTH SURG 4X10 WHT LF (GAUZE/BANDAGES/DRESSINGS) ×2 IMPLANT
TOWEL GREEN STERILE (TOWEL DISPOSABLE) ×3 IMPLANT
TOWEL GREEN STERILE FF (TOWEL DISPOSABLE) ×3 IMPLANT
TRAY FOLEY SLVR 16FR TEMP STAT (SET/KITS/TRAYS/PACK) ×3 IMPLANT
TUBE CONNECTING 20'X1/4 (TUBING) ×1
TUBE CONNECTING 20X1/4 (TUBING) ×1 IMPLANT
TUBING LAP HI FLOW INSUFFLATIO (TUBING) ×1 IMPLANT
UNDERPAD 30X30 (UNDERPADS AND DIAPERS) ×3 IMPLANT
WATER STERILE IRR 1000ML POUR (IV SOLUTION) ×6 IMPLANT

## 2019-05-14 SURGICAL SUPPLY — 11 items
CATH INFINITI JR4 5F (CATHETERS) ×1 IMPLANT
CATH LAUNCHER 6FR AR1 (CATHETERS) ×1 IMPLANT
CATH VISTA GUIDE 6FR 3DRC (CATHETERS) ×1 IMPLANT
CATH VISTA GUIDE 6FR JR4 (CATHETERS) ×1 IMPLANT
KIT HEART LEFT (KITS) ×2 IMPLANT
PACK CARDIAC CATHETERIZATION (CUSTOM PROCEDURE TRAY) ×2 IMPLANT
SHEATH PINNACLE 7F 10CM (SHEATH) ×1 IMPLANT
SYR MEDRAD MARK 7 150ML (SYRINGE) ×2 IMPLANT
TRANSDUCER W/STOPCOCK (MISCELLANEOUS) ×2 IMPLANT
TUBING CIL FLEX 10 FLL-RA (TUBING) ×2 IMPLANT
WIRE EMERALD 3MM-J .035X150CM (WIRE) ×1 IMPLANT

## 2019-05-14 NOTE — Anesthesia Preprocedure Evaluation (Addendum)
Anesthesia Evaluation  Patient identified by MRN, date of birth, ID band Patient awake    Reviewed: Allergy & Precautions, NPO status , Patient's Chart, lab work & pertinent test results  History of Anesthesia Complications Negative for: history of anesthetic complications  Airway Mallampati: II  TM Distance: >3 FB Neck ROM: Full    Dental  (+) Edentulous Upper, Edentulous Lower   Pulmonary COPD, former smoker,  05/10/2019 SARS coronavirus NEG   breath sounds clear to auscultation       Cardiovascular hypertension, Pt. on medications + angina + CAD, + Past MI and + Cardiac Stents  PERIPHERAL VASCULAR DISEASE: RCA stent x2 today.   Rhythm:Irregular Rate:Tachycardia  Cath: single vessel RCA disease, stent x2, EF 50% with inferior hypokinesis Now with hemopericardium with evidence of tamponade/RV compression: for sternotomy evacuation   Neuro/Psych negative neurological ROS     GI/Hepatic Neg liver ROS, GERD  Controlled,  Endo/Other  Hypothyroidism   Renal/GU negative Renal ROS     Musculoskeletal   Abdominal   Peds  Hematology  (+) Blood dyscrasia (Hb 8.5), anemia , Brilinta INR 2.2   Anesthesia Other Findings   Reproductive/Obstetrics                           Anesthesia Physical Anesthesia Plan  ASA: IV and emergent  Anesthesia Plan: General   Post-op Pain Management:    Induction: Intravenous  PONV Risk Score and Plan: 2 and Treatment may vary due to age or medical condition  Airway Management Planned: Oral ETT  Additional Equipment: Arterial line  Intra-op Plan:   Post-operative Plan: Post-operative intubation/ventilation  Informed Consent: I have reviewed the patients History and Physical, chart, labs and discussed the procedure including the risks, benefits and alternatives for the proposed anesthesia with the patient or authorized representative who has indicated his/her  understanding and acceptance.     Only emergency history available  Plan Discussed with: CRNA and Surgeon  Anesthesia Plan Comments:        Anesthesia Quick Evaluation

## 2019-05-14 NOTE — Interval H&P Note (Signed)
History and Physical Interval Note:  05/14/2019 9:58 PM  Amy Chambers  has presented today for surgery, with the diagnosis of Pericardial Tamponade - Hemopericardium.  The various methods of treatment have been discussed with the patient and family. After consideration of risks, benefits and other options for treatment, the patient has consented to  Procedure(s): LEFT HEART CATH AND CORONARY ANGIOGRAPHY (N/A) as a surgical intervention.  The patient's history has been reviewed, patient examined, no change in status, stable for surgery.  I have reviewed the patient's chart and labs.  Questions were answered to the patient's satisfaction.     Glenetta Hew

## 2019-05-14 NOTE — Progress Notes (Signed)
Physical Therapy Treatment Patient Details Name: Amy Chambers MRN: [id number] DOB: [birth year] Today's Date: 05/14/2019    History of Present Illness 85 yo female with onset of hypoxia, hypercapnia and acute respiratory failure was admitted, may have been a medication reaction.  Cleared for PE, Covid (-).  PMHx:  thyroid disease, COPD, HTN, atherosclerosis carotids, bronchitis    PT Comments    Continuing work on functional mobility and activity tolerance;  Opted to walk on supplemental O2 as she is going to Cath Lab later; Overall, notable progress with amb distance, and she is motivated to get better and return home   Follow Up Recommendations  Home health PT;Supervision for mobility/OOB     Equipment Recommendations  Rolling walker with 5" wheels;Other (comment)(We can also consider Rollator RW)    Recommendations for Other Services       Precautions / Restrictions Precautions Precautions: Fall Precaution Comments: monitor for O2 sats and pulses Restrictions Weight Bearing Restrictions: No    Mobility  Bed Mobility Overal bed mobility: Needs Assistance Bed Mobility: Supine to Sit     Supine to sit: Min guard     General bed mobility comments: min guard for safety and to manage all her lines  Transfers Overall transfer level: Needs assistance Equipment used: Rolling walker (2 wheeled) Transfers: Sit to/from Stand Sit to Stand: Min guard         General transfer comment: cues for hand placement   Ambulation/Gait Ambulation/Gait assistance: Min guard Gait Distance (Feet): 60 Feet Assistive device: Rolling walker (2 wheeled) Gait Pattern/deviations: Step-through pattern;Decreased step length - right;Decreased step length - left Gait velocity: reduced   General Gait Details: Opted to support her on 2 L supplemental O2 because she is going to Cath lab later today; cues to self-monitor for activity tolerance   Stairs             Wheelchair Mobility     Modified Rankin (Stroke Patients Only)       Balance Overall balance assessment: Needs assistance Sitting-balance support: Feet supported Sitting balance-Leahy Scale: Good     Standing balance support: During functional activity;No upper extremity supported Standing balance-Leahy Scale: Fair Standing balance comment: pt able to static stand without support, demonstrated preference for intermittent single UE support and during mobility required BUE                            Cognition Arousal/Alertness: Awake/alert Behavior During Therapy: WFL for tasks assessed/performed Overall Cognitive Status: Within Functional Limits for tasks assessed                                        Exercises      General Comments        Pertinent Vitals/Pain Pain Assessment: Faces Pain Score: 3  Faces Pain Scale: Hurts a little bit Pain Location: head Pain Descriptors / Indicators: Constant;Tender Pain Intervention(s): Monitored during session    Home Living Family/patient expects to be discharged to:: Private residence Living Arrangements: Alone Available Help at Discharge: Family;Available PRN/intermittently Type of Home: House Home Access: Level entry   Home Layout: One level Home Equipment: Walker - 2 wheels Additional Comments: has been living alone, able to walk with no assist and no recent falls    Prior Function Level of Independence: Independent with assistive device(s)      Comments:  mild SOB with longer walks   PT Goals (current goals can now be found in the care plan section) Acute Rehab PT Goals Patient Stated Goal: to get home and feel stronger PT Goal Formulation: With patient/family Time For Goal Achievement: 05/20/19 Potential to Achieve Goals: Good Progress towards PT goals: Progressing toward goals    Frequency    Min 3X/week      PT Plan Current plan remains appropriate    Co-evaluation              AM-PAC PT  "6 Clicks" Mobility   Outcome Measure  Help needed turning from your back to your side while in a flat bed without using bedrails?: None Help needed moving from lying on your back to sitting on the side of a flat bed without using bedrails?: None Help needed moving to and from a bed to a chair (including a wheelchair)?: A Little Help needed standing up from a chair using your arms (e.g., wheelchair or bedside chair)?: A Little Help needed to walk in hospital room?: A Little Help needed climbing 3-5 steps with a railing? : A Lot 6 Click Score: 19    End of Session Equipment Utilized During Treatment: Gait belt;Oxygen Activity Tolerance: Patient tolerated treatment well Patient left: Other (comment)(Initiating session with OT at the sink) Nurse Communication: Mobility status PT Visit Diagnosis: Unsteadiness on feet (R26.81);Muscle weakness (generalized) (M62.81)     Time: 1694-5038 PT Time Calculation (min) (ACUTE ONLY): 21 min  Charges:  $Gait Training: 8-22 mins                     Roney Marion, [place]  Acute Rehabilitation Services Pager [phone] Office [phone]    Amy Chambers 05/14/2019, 12:03 PM

## 2019-05-14 NOTE — Consult Note (Signed)
[address]       [place] [address]             [phone]        Amy Chambers [facility] Medical Record #[id number] Date of Birth: June 15, 1928  Referring: No ref. provider found Primary Care: Bennie Pierini, [name] Primary Cardiologist:No primary care provider on file.  Chief Complaint:    Chief Complaint  Patient presents with  . Shortness of Breath    History of Present Illness:     85 year old female was admitted to the cardiology service following the PCI to the right coronary artery who is noted to have developed abdominal pain as well as chest pain.  She underwent a CT scan which noted a new pericardial effusion.  Echocardiogram showed evidence of tamponade as well as clot within the pericardium.  There was concern for right coronary perforation.  CTS has been consulted to assist in management.    Past Medical History:  Diagnosis Date  . Cataract   . COPD (chronic obstructive pulmonary disease) (HCC)   . GERD (gastroesophageal reflux disease)   . Hypertension   . Osteopenia   . Thyroid disease     Past Surgical History:  Procedure Laterality Date  . CATARACT EXTRACTION, BILATERAL       Social History   Tobacco Use  Smoking Status Former Smoker  . Quit date: 08/15/1994  . Years since quitting: 24.7  Smokeless Tobacco Former Neurosurgeon  . Types: Chew  . Quit date: 08/15/1998    Social History   Substance and Sexual Activity  Alcohol Use No     Allergies  Allergen Reactions  . Crestor [Rosuvastatin Calcium] Other (See Comments)    Cramping/pain in lower extremeties  . Niaspan [Niacin Er] Other (See Comments)    Cramping/pain in lower extremeties      Current Facility-Administered Medications  Medication Dose Route Frequency Provider Last Rate Last Dose  . 0.9 %  sodium chloride infusion (Manually program via Guardrails IV Fluids)   Intravenous Once Dunn, Dayna N, PA-C      . 0.9 %  sodium chloride infusion (Manually program  via Guardrails IV Fluids)   Intravenous Once Dunn, Dayna N, PA-C      . 0.9 %  sodium chloride infusion   Intravenous Continuous Lennette Bihari, MD 75 mL/hr at 05/14/19 1708 75 mL/hr at 05/14/19 1708  . 0.9 %  sodium chloride infusion  250 mL Intravenous PRN Lennette Bihari, MD      . acetaminophen (TYLENOL) tablet 650 mg  650 mg Oral Q6H PRN Leda Gauze, NP   650 mg at 05/14/19 1712  . albuterol (PROVENTIL) (2.5 MG/3ML) 0.083% nebulizer solution 2.5 mg  2.5 mg Nebulization Q2H PRN Levie Heritage, DO   2.5 mg at 05/11/19 2006  . aspirin chewable tablet 81 mg  81 mg Oral Daily Levie Heritage, DO   81 mg at 05/14/19 0542  . atorvastatin (LIPITOR) tablet 40 mg  40 mg Oral q1800 O'Neal, Ronnald Ramp, MD      . ceFAZolin (ANCEF) IVPB 2g/100 mL premix  2 g Intravenous Once Marykay Lex, MD      . cefTRIAXone (ROCEPHIN) 1 g in sodium chloride 0.9 % 100 mL IVPB  1 g Intravenous Q24H Shahmehdi, Seyed A, MD 200 mL/hr at 05/13/19 1628 1 g at 05/13/19 1628  . Chlorhexidine Gluconate Cloth 2 % PADS 6 each  6 each Topical Daily  Lennette BihariKelly, Thomas A, MD      . diazepam (VALIUM) tablet 2 mg  2 mg Oral Q4H PRN Lennette BihariKelly, Thomas A, MD      . guaiFENesin (MUCINEX) 12 hr tablet 1,200 mg  1,200 mg Oral BID Dow AdolphHall, Carole N, DO   1,200 mg at 05/14/19 1039  . levothyroxine (SYNTHROID) tablet 75 mcg  75 mcg Oral Daily Levie HeritageStinson, Jacob J, DO   75 mcg at 05/14/19 0542  . loratadine (CLARITIN) tablet 10 mg  10 mg Oral Daily Levie HeritageStinson, Jacob J, DO   10 mg at 05/14/19 1039  . mometasone-formoterol (DULERA) 200-5 MCG/ACT inhaler 2 puff  2 puff Inhalation BID Levie HeritageStinson, Jacob J, DO   2 puff at 05/14/19 0816  . norepinephrine (LEVOPHED) 4mg  in 250mL premix infusion  4 mcg/min Intravenous Titrated Dunn, Dayna N, PA-C      . ondansetron (ZOFRAN) tablet 4 mg  4 mg Oral Q6H PRN Levie HeritageStinson, Jacob J, DO       Or  . ondansetron [facility](ZOFRAN) injection 4 mg  4 mg Intravenous Q6H PRN Levie HeritageStinson, Jacob J, DO   4 mg at 05/11/19 1529  .  pantoprazole (PROTONIX) EC tablet 40 mg  40 mg Oral Q0600 Abelino DerrickKilroy, Luke K, PA-C   40 mg at 05/14/19 0542  . polyethylene glycol (MIRALAX / GLYCOLAX) packet 17 g  17 g Oral Daily Levie HeritageStinson, Jacob J, DO   17 g at 05/13/19 0913  . [START ON 05/15/2019] predniSONE (DELTASONE) tablet 40 mg  40 mg Oral Q breakfast Hall, Carole N, DO      . sodium chloride 0.9 % bolus 1,000 mL  1,000 mL Intravenous Once Dunn, Dayna N, PA-C      . sodium chloride flush (NS) 0.9 % injection 10-40 mL  10-40 mL Intracatheter Q12H Lennette BihariKelly, Thomas A, MD      . sodium chloride flush (NS) 0.9 % injection 10-40 mL  10-40 mL Intracatheter PRN Lennette BihariKelly, Thomas A, MD      . sodium chloride flush (NS) 0.9 % injection 3 mL  3 mL Intravenous Q12H Marjie SkiffGoodrich, Callie E, PA-C   3 mL at 05/13/19 2021  . sodium chloride flush (NS) 0.9 % injection 3 mL  3 mL Intravenous Q12H Nicki GuadalajaraKelly, Thomas A, MD      . sodium chloride flush (NS) 0.9 % injection 3 mL  3 mL Intravenous PRN Lennette BihariKelly, Thomas A, MD      . ticagrelor [facility](BRILINTA) tablet 90 mg  90 mg Oral BID Lennette BihariKelly, Thomas A, MD      . traZODone (DESYREL) tablet 50 mg  50 mg Oral QHS PRN Macon LargeBodenheimer, Charles A, NP   50 mg at 05/12/19 2108    Medications Prior to Admission  Medication Sig Dispense Refill Last Dose  . albuterol (VENTOLIN HFA) 108 (90 Base) MCG/ACT inhaler INHALE 2 PUFFS INTO LUNGS EVERY 6 HOURS AS NEEDED FOR WHEEZING (Patient taking differently: Inhale 2 puffs into the lungs every 6 (six) hours as needed for wheezing or shortness of breath. ) 9 g 2 05/10/2019 at Unknown time  . amoxicillin-clavulanate (AUGMENTIN) 875-125 MG tablet Take 1 tablet by mouth 2 (two) times daily. 14 tablet 0 05/10/2019 at 1030  . aspirin 81 MG tablet Take 81 mg by mouth daily.   05/10/2019 at 0600  . benazepril (LOTENSIN) 20 MG tablet Take 1 tablet (20 mg total) by mouth daily. 90 tablet 1 05/10/2019 at 0600  . budesonide-formoterol (SYMBICORT) 160-4.5 MCG/ACT inhaler Inhale 2 puffs into the lungs 2 (two)  times daily. 1  Inhaler 3 05/10/2019 at Unknown time  . calcium-vitamin D 250-100 MG-UNIT per tablet Take 1 tablet by mouth 2 (two) times daily.    05/10/2019 at Unknown time  . fluticasone (FLONASE) 50 MCG/ACT nasal spray Place 2 sprays into both nostrils daily. 16 g 6 05/10/2019 at Unknown time  . levothyroxine (SYNTHROID) 75 MCG tablet Take 1 tablet (75 mcg total) by mouth daily. 90 tablet 1 05/10/2019 at 0600  . polyethylene glycol (MIRALAX / GLYCOLAX) packet Take 17 g by mouth daily.   05/10/2019 at Unknown time  . VITAMIN D PO Take 1 capsule by mouth daily.   05/10/2019 at Unknown time    Family History  Problem Relation Age of Onset  . Diabetes Mother   . Cancer Brother   . Tuberculosis Father   . Early death Sister   . Early death Brother   . COPD Brother   . Emphysema Brother   . Early death Sister   . COPD Sister   . Emphysema Sister   . Aneurysm Sister   . Stroke Sister   . Cancer Sister   . Hypertension Son   . Hypertension Daughter   . Fibromyalgia Daughter   . Hypertension Daughter      Review of Systems:   Review of Systems  Unable to perform ROS: Critical illness      Physical Exam: BP (!) 82/64   Pulse (!) 118   Temp (!) 97.3 F (36.3 C) (Rectal)   Resp (!) 33   Ht 5\' 1"  (1.549 m)   Wt 57.2 kg   SpO2 100%   BMI 23.83 kg/m  Physical Exam  Constitutional: She appears distressed.  Frail   Cardiovascular:  Tachycardic   Pulmonary/Chest: She is in respiratory distress.  Skin: She is diaphoretic.      Diagnostic Studies & Laboratory data:       I have independently reviewed the above radiologic studies and discussed with the patient   Recent Lab Findings: Lab Results  Component Value Date   WBC 12.2 (H) 05/14/2019   HGB 8.5 (L) 05/14/2019   HCT 25.0 (L) 05/14/2019   PLT 306 05/14/2019   GLUCOSE 271 (H) 05/14/2019   CHOL 204 (H) 08/30/2018   TRIG 140 08/30/2018   HDL 46 08/30/2018   LDLCALC 130 (H) 08/30/2018   ALT 32 05/10/2019   AST 38 05/10/2019    NA 139 05/14/2019   K 4.6 05/14/2019   CL 105 05/14/2019   CREATININE 0.60 05/14/2019   BUN 30 (H) 05/14/2019   CO2 21 (L) 05/14/2019   TSH 1.720 08/30/2018   INR 2.2 (H) 05/14/2019      Assessment / Plan:   85 year old female with pericardial effusion following PCI to the right coronary.  The pericardial effusion has a combination of blood and clot within the sac and there is evidence of cardiac tamponade.  She is currently on Brilinta following her PCI, and due to the new stent there is concern that if this is a perforation she will require coronary artery bypass grafting as well as evacuation of her hematoma.  She will go to the Cath Lab with cardiology visualize her coronary anatomy and following that she will go to the operating room for a sternotomy and evacuation of the hematoma.  Chosen to proceed with sternotomy given the strong likelihood that she will require bypass graft to the right coronary.     I  spent 40 minutes counseling the patient  face to face.   Lajuana Matte 05/14/2019 9:34 PM

## 2019-05-14 NOTE — Progress Notes (Signed)
Echocardiogram 2D Echocardiogram has been performed.  Oneal Deputy Malaky Tetrault 05/14/2019, 9:20 PM

## 2019-05-14 NOTE — H&P (View-Only) (Signed)
Brief interventional cardiology consultation note NAME:  Amy Chambers   MRN: [id number] DOB:  1928/01/09   ADMIT DATE: 05/10/2019   05/14/2019 9:35 PM    HPI: Amy Chambers is a 85 y.o. female with history of COPD and GERD who was admitted as a direct transfer for acute hypoxic [place] failure secondary COP exacerbation versus pneumonia.  She is found to have elevated troponins consistent with non-STEMI and was referred for cardiac catheterization with possible PCI today performed with Dr. Nicki Guadalajara.  Cardiac cath showed proximal to mid RCA 80% stenosis treated with difficult PCI and 2 overlapping Synergy DES stents 3.5 mm x 20 mm and 3.0 mm 12 mm postdilated to 3.6-3.5 mm (requiring guideliner).  Patient did relatively well initially post cath, however Dr. Lennie Odor was called to the bedside for acute decompensation with hypotension blood pressures ranging in the 80s over 40s.  She was resuscitated with IV fluids.  She has stat central line placed.  Initial CT of the abdomen pelvis was performed look for retroperitoneal bleed given femoral access which did not show any evidence of bleed.  She was found to have a 3 unit drop in hemoglobin and was given at least 2 units of stat PRBC.  Follow-up evaluation with a bedside echocardiogram suggested pericardial effusion.  After discussions with Dr. Cliffton Asters from CT surgery, first a CT angiogram of the chest was performed to exclude dissection followed by a bedside echo confirming a relatively large pericardial effusion with what appears to be coagulating blood suggestive of hemopericardium.  The patient is on Levophed with blood pressures in the 110s and heart rate in the 120s.  There is clear evidence by echocardiogram for tamponade physiology.  After discussion with myself and Dr. Gillis Santa along with Dr. Cliffton Asters, we felt like the patient would likely and eventually require a cardiac window, but we wanted to perform diagnostic coronary angiography  to exclude ongoing bleeding from the RCA which was stented.  The feeling was that while pericardiocentesis may be a rapid approach, given the amount of thrombus apparent, would likely not be the definitive treatment.  Past Medical History:  Diagnosis Date  . Cataract   . COPD (chronic obstructive pulmonary disease) (HCC)   . GERD (gastroesophageal reflux disease)   . Hypertension   . Osteopenia   . Thyroid disease    Past Surgical History:  Procedure Laterality Date  . CATARACT EXTRACTION, BILATERAL      FAMHx: Family History  Problem Relation Age of Onset  . Diabetes Mother   . Cancer Brother   . Tuberculosis Father   . Early death Sister   . Early death Brother   . COPD Brother   . Emphysema Brother   . Early death Sister   . COPD Sister   . Emphysema Sister   . Aneurysm Sister   . Stroke Sister   . Cancer Sister   . Hypertension Son   . Hypertension Daughter   . Fibromyalgia Daughter   . Hypertension Daughter     SOCHx:  reports that she quit smoking about 24 years ago. She quit smokeless tobacco use about 20 years ago.  Her smokeless tobacco use included chew. She reports that she does not drink alcohol or use drugs.  ALLERGIES: Allergies  Allergen Reactions  . Crestor [Rosuvastatin Calcium] Other (See Comments)    Cramping/pain in lower extremeties  . Niaspan [Niacin Er] Other (See Comments)    Cramping/pain in lower extremeties    HOME  MEDICATIONS: Medications Prior to Admission  Medication Sig Dispense Refill Last Dose  . albuterol (VENTOLIN HFA) 108 (90 Base) MCG/ACT inhaler INHALE 2 PUFFS INTO LUNGS EVERY 6 HOURS AS NEEDED FOR WHEEZING (Patient taking differently: Inhale 2 puffs into the lungs every 6 (six) hours as needed for wheezing or shortness of breath. ) 9 g 2 05/10/2019 at Unknown time  . amoxicillin-clavulanate (AUGMENTIN) 875-125 MG tablet Take 1 tablet by mouth 2 (two) times daily. 14 tablet 0 05/10/2019 at 1030  . aspirin 81 MG tablet Take  81 mg by mouth daily.   05/10/2019 at 0600  . benazepril (LOTENSIN) 20 MG tablet Take 1 tablet (20 mg total) by mouth daily. 90 tablet 1 05/10/2019 at 0600  . budesonide-formoterol (SYMBICORT) 160-4.5 MCG/ACT inhaler Inhale 2 puffs into the lungs 2 (two) times daily. 1 Inhaler 3 05/10/2019 at Unknown time  . calcium-vitamin D 250-100 MG-UNIT per tablet Take 1 tablet by mouth 2 (two) times daily.    05/10/2019 at Unknown time  . fluticasone (FLONASE) 50 MCG/ACT nasal spray Place 2 sprays into both nostrils daily. 16 g 6 05/10/2019 at Unknown time  . levothyroxine (SYNTHROID) 75 MCG tablet Take 1 tablet (75 mcg total) by mouth daily. 90 tablet 1 05/10/2019 at 0600  . polyethylene glycol (MIRALAX / GLYCOLAX) packet Take 17 g by mouth daily.   05/10/2019 at Unknown time  . VITAMIN D PO Take 1 capsule by mouth daily.   05/10/2019 at Unknown time    ROS mostly notes back pain, some dyspnea but no obvious chest pain or pressure.  PHYSICAL EXAM:Blood pressure (!) 82/64, pulse (!) 118, temperature (!) 97.3 F (36.3 C), temperature source Rectal, resp. rate (!) 33, height 5\' 1"  (1.549 m), weight 57.2 kg, SpO2 100 %. General appearance: She is awake and alert.  Oriented and 3, but little bit confused.  Mostly complaining of back pain Neck: There is evidence of mild JVD, difficult to assess due to how she is lying Heart: Tachycardia, unable to truly auscultate any pericardial friction rub but no gallops noted.  S1 and S2 seem normal. Abdomen: Mild tenderness palpation but otherwise soft with mildly decreased but bowel sounds Extremities: Cool lower extremities but with palpable pedal pulses.  Right femoral arterial line and left femoral venous line in place. Pulses: Palpable, but diminished pedal pulses Neurologic: Mental status: Alert, oriented, thought content appropriate, Mildly confused  Telemetry shows sinus tachycardia with rates in the 120s  IMPRESSION & PLAN The patients' history has been reviewed,  patient examined, no change in status from most recent note, stable for surgery. I have reviewed the patients' chart and labs. Questions were answered to the patient's satisfaction.    Marcy Panning Knepp has presented today for Cardiac Catheterization, with the diagnosis of pericardial tamponade status post recent DES PCI to the RCA The various methods of treatment have been discussed with the patient and family.   Risks / Complications include, but not limited to: Death, MI, CVA/TIA, VF/VT (with defibrillation), Bradycardia (need for temporary pacer placement), contrast induced nephropathy, bleeding / bruising / hematoma / pseudoaneurysm, vascular or coronary injury (with possible emergent CT or Vascular Surgery), adverse medication reactions, infection.     After consideration of risks, benefits and other options for treatment, the patient has consented to Procedure(s):  LEFT HEART CATHETERIZATION AND CORONARY ANGIOGRAPHY +/- AD [place]  as a surgical intervention.   We will proceed with the planned procedure.   [facility] [address] [place], [place]  [id number]  [phone]  05/14/2019 9:35 PM

## 2019-05-14 NOTE — Progress Notes (Signed)
Occupational Therapy Evaluation Patient Details Name: Amy Chambers MRN: [id number] DOB: [birth year] Today's Date: 05/14/2019    History of Present Illness 85 yo female with onset of hypoxia, hypercapnia and acute respiratory failure was admitted, may have been a medication reaction.  Cleared for PE, Covid (-).  PMHx:  thyroid disease, COPD, HTN, atherosclerosis carotids, bronchitis   Clinical Impression   PTA, pt was living at home alone, and reports she was independent with ADL/IADL and functional mobility with RW. Pt reports family is available to assist prn. Pt currently requires minguard for all ADL and functional mobility at RW level. Pt on 2lnc maintain SpO2 >95% throughout session. HR within normal range during session. Due to decline in current level of function, pt would benefit from acute OT to address established goals to facilitate safe D/C to venue listed below. At this time, recommend HHOT follow-up. Will continue to follow acutely.     Follow Up Recommendations  Home health OT;Supervision - Intermittent    Equipment Recommendations  None recommended by OT    Recommendations for Other Services       Precautions / Restrictions Precautions Precautions: Fall Precaution Comments: monitor for O2 sats and pulses Restrictions Weight Bearing Restrictions: No      Mobility Bed Mobility               General bed mobility comments: pt arriving back to room with PT upon arrival  Transfers Overall transfer level: Needs assistance Equipment used: Rolling walker (2 wheeled);1 person hand held assist Transfers: Sit to/from Stand Sit to Stand: Min guard         General transfer comment: cues for hand placement     Balance Overall balance assessment: Needs assistance Sitting-balance support: Feet supported Sitting balance-Leahy Scale: Good     Standing balance support: During functional activity;No upper extremity supported Standing balance-Leahy Scale:  Fair Standing balance comment: pt able to static stand without support, demonstrated preference for intermittent single UE support and during mobility required BUE                           ADL either performed or assessed with clinical judgement   ADL Overall ADL's : Needs assistance/impaired Eating/Feeding: Set up;Sitting   Grooming: Min guard;Standing Grooming Details (indicate cue type and reason): completed at sink level Upper Body Bathing: Set up;Sitting   Lower Body Bathing: Min guard;Sit to/from stand   Upper Body Dressing : Set up;Sitting   Lower Body Dressing: Min guard;Sit to/from stand   Toilet Transfer: Min guard;Ambulation;RW Toilet Transfer Details (indicate cue type and reason): pt used BSC Toileting- Clothing Manipulation and Hygiene: Min guard;Sit to/from stand Toileting - Clothing Manipulation Details (indicate cue type and reason): following urination     Functional mobility during ADLs: Min guard;Rolling walker General ADL Comments: pt on 2lnc throughout session, SpO2 >95%. Began education on energy conservation strategies during ADL/IADL     Vision Patient Visual Report: No change from baseline       Perception     Praxis      Pertinent Vitals/Pain Pain Assessment: 0-10 Pain Score: 3  Pain Location: head Pain Descriptors / Indicators: Constant;Tender Pain Intervention(s): Limited activity within patient's tolerance;Monitored during session     Hand Dominance Right   Extremity/Trunk Assessment Upper Extremity Assessment Upper Extremity Assessment: Generalized weakness   Lower Extremity Assessment Lower Extremity Assessment: Defer to PT evaluation   Cervical / Trunk Assessment Cervical / Trunk Assessment:  Kyphotic   Communication Communication Communication: No difficulties   Cognition Arousal/Alertness: Awake/alert Behavior During Therapy: WFL for tasks assessed/performed Overall Cognitive Status: Within Functional Limits  for tasks assessed                                     General Comments       Exercises     Shoulder Instructions      Home Living Family/patient expects to be discharged to:: Private residence Living Arrangements: Alone Available Help at Discharge: Family;Available PRN/intermittently Type of Home: House Home Access: Level entry     Home Layout: One level     Bathroom Shower/Tub: Teacher, early years/pre: Standard     Home Equipment: Environmental consultant - 2 wheels   Additional Comments: has been living alone, able to walk with no assist and no recent falls      Prior Functioning/Environment Level of Independence: Independent with assistive device(s)        Comments: mild SOB with longer walks        OT Problem List: Decreased activity tolerance;Impaired balance (sitting and/or standing);Decreased safety awareness;Decreased knowledge of use of DME or AE;Decreased knowledge of precautions;Cardiopulmonary status limiting activity      OT Treatment/Interventions: Self-care/ADL training;Therapeutic exercise;Energy conservation;DME and/or AE instruction;Therapeutic activities;Patient/family education;Balance training    OT Goals(Current goals can be found in the care plan section) Acute Rehab OT Goals Patient Stated Goal: to get home and feel stronger OT Goal Formulation: With patient Time For Goal Achievement: 05/28/19 Potential to Achieve Goals: Good ADL Goals Pt Will Perform Grooming: Independently Pt Will Perform Upper Body Dressing: Independently Pt Will Perform Lower Body Dressing: Independently Pt Will Transfer to Toilet: Independently Pt Will Perform Toileting - Clothing Manipulation and hygiene: Independently Pt Will Perform Tub/Shower Transfer: Independently Additional ADL Goal #1: Pt will demonstrate independence with 3 energy conservation strategies during ADL/IADL completion.  OT Frequency: Min 2X/week   Barriers to D/C: Decreased  caregiver support  pt lives alone       Co-evaluation              AM-PAC OT "6 Clicks" Daily Activity     Outcome Measure Help from another person eating meals?: A Little Help from another person taking care of personal grooming?: A Little Help from another person toileting, which includes using toliet, bedpan, or urinal?: A Little Help from another person bathing (including washing, rinsing, drying)?: A Little Help from another person to put on and taking off regular upper body clothing?: A Little Help from another person to put on and taking off regular lower body clothing?: A Little 6 Click Score: 18   End of Session Equipment Utilized During Treatment: Rolling walker;Oxygen Nurse Communication: Mobility status  Activity Tolerance: Patient tolerated treatment well Patient left: in chair;with call bell/phone within reach;with chair alarm set;with nursing/sitter in room  OT Visit Diagnosis: Other abnormalities of gait and mobility (R26.89);Muscle weakness (generalized) (M62.81);Pain Pain - part of body: (head)                Time: 2440-1027 OT Time Calculation (min): 15 min Charges:  OT General Charges $OT Visit: 1 Visit OT Evaluation $OT Eval Moderate Complexity: [place] OTR/L Acute Rehabilitation Services Office: [place] 05/14/2019, 11:03 AM

## 2019-05-14 NOTE — H&P (View-Only) (Signed)
Cardiology Progress Note  Patient ID: Amy Chambers MRN: [id number] DOB: 09/02/1927 Date of Encounter: 05/14/2019  Primary Cardiologist: No primary care provider on file.  Subjective  N.p.o. for left heart cath today.  Reports no chest pain or trouble breathing.  She reports pain in her head.  ROS:  All other ROS reviewed and negative. Pertinent positives noted in the HPI.     Inpatient Medications  Scheduled Meds: . aspirin  81 mg Oral Daily  . atorvastatin  40 mg Oral q1800  . benazepril  20 mg Oral Daily  . guaiFENesin  1,200 mg Oral BID  . isosorbide mononitrate  15 mg Oral Daily  . levothyroxine  75 mcg Oral Daily  . loratadine  10 mg Oral Daily  . methylPREDNISolone (SOLU-MEDROL) injection  60 mg Intravenous Q12H  . mometasone-formoterol  2 puff Inhalation BID  . pantoprazole  40 mg Oral Q0600  . polyethylene glycol  17 g Oral Daily  . sodium chloride flush  3 mL Intravenous Q12H   Continuous Infusions: . sodium chloride    . sodium chloride Stopped (05/14/19 0605)  . sodium chloride 75 mL/hr at 05/14/19 0605  . cefTRIAXone (ROCEPHIN)  IV 1 g (05/13/19 1628)  . heparin 700 Units/hr (05/14/19 0741)   PRN Meds: sodium chloride, acetaminophen, albuterol, nitroGLYCERIN, ondansetron **OR** ondansetron (ZOFRAN) IV, sodium chloride flush, traZODone   Vital Signs   Vitals:   05/14/19 0127 05/14/19 0658 05/14/19 0749 05/14/19 0816  BP:  (!) 158/79 (!) 160/78   Pulse:  72 72   Resp:   20   Temp:  97.7 F (36.5 C)    TempSrc:  Oral    SpO2:  100% 99% 100%  Weight: 57.2 kg     Height:        Intake/Output Summary (Last 24 hours) at 05/14/2019 0909 Last data filed at 05/14/2019 0600 Gross per 24 hour  Intake 1488.4 ml  Output 350 ml  Net 1138.4 ml   Last 3 Weights 05/14/2019 05/13/2019 05/12/2019  Weight (lbs) 126 lb 1.7 oz 118 lb 6.4 oz 117 lb 14.4 oz  Weight (kg) 57.2 kg 53.706 kg 53.479 kg      Telemetry  Overnight telemetry shows normal sinus rhythm with  frequent PACs heart rate in the 60s, which I personally reviewed.   Physical Exam   Vitals:   05/14/19 0127 05/14/19 0658 05/14/19 0749 05/14/19 0816  BP:  (!) 158/79 (!) 160/78   Pulse:  72 72   Resp:   20   Temp:  97.7 F (36.5 C)    TempSrc:  Oral    SpO2:  100% 99% 100%  Weight: 57.2 kg     Height:         Intake/Output Summary (Last 24 hours) at 05/14/2019 0909 Last data filed at 05/14/2019 0600 Gross per 24 hour  Intake 1488.4 ml  Output 350 ml  Net 1138.4 ml    Last 3 Weights 05/14/2019 05/13/2019 05/12/2019  Weight (lbs) 126 lb 1.7 oz 118 lb 6.4 oz 117 lb 14.4 oz  Weight (kg) 57.2 kg 53.706 kg 53.479 kg    Body mass index is 23.83 kg/m.  General: Well nourished, well developed, in no acute distress Head: Atraumatic, normal size  Eyes: PEERLA, EOMI  Neck: Supple, no JVD Endocrine: No thryomegaly Cardiac: Normal S1, S2; RRR; no murmurs, rubs, or gallops Lungs: Clear to auscultation bilaterally, no wheezing, rhonchi or rales  Abd: Soft, nontender, no hepatomegaly  Ext: No edema,  pulses 2+ Musculoskeletal: No deformities, BUE and BLE strength normal and equal Skin: Warm and dry, no rashes   Neuro: Alert and oriented to person, place, time, and situation, CNII-XII grossly intact, no focal deficits  Psych: Normal mood and affect   Labs  High Sensitivity Troponin:   Recent Labs  Lab 05/10/19 1303 05/10/19 1527 05/10/19 1906 05/12/19 0653 05/12/19 0908  TROPONINIHS 44* 499* 975* 1,563* 1,288*     Cardiac EnzymesNo results for input(s): TROPONINI in the last 168 hours. No results for input(s): TROPIPOC in the last 168 hours.  Chemistry Recent Labs  Lab 05/10/19 1303 05/11/19 0458 05/13/19 0853 05/14/19 0328  NA 138 141 140 139  K 4.3 4.7 4.2 4.2  CL 104 104 103 105  CO2 23 25 27 27   GLUCOSE 195* 141* 137* 152*  BUN 27* 24* 38* 37*  CREATININE 0.78 0.89 0.76 0.70  CALCIUM 8.6* 9.1 8.8* 8.2*  PROT 7.3  --   --   --   ALBUMIN 4.0  --   --   --   AST 38   --   --   --   ALT 32  --   --   --   ALKPHOS 65  --   --   --   BILITOT 0.5  --   --   --   GFRNONAA >60 57* >60 >60  GFRAA >60 >60 >60 >60  ANIONGAP 11 12 10 7     Hematology Recent Labs  Lab 05/12/19 0444 05/13/19 0853 05/14/19 0328  WBC 11.4* 6.7 6.8  RBC 3.93 3.93 3.67*  HGB 12.1 11.7* 10.9*  HCT 37.7 37.4 35.2*  MCV 95.9 95.2 95.9  MCH 30.8 29.8 29.7  MCHC 32.1 31.3 31.0  RDW 13.4 13.3 13.4  PLT 152 153 137*   BNP Recent Labs  Lab 05/10/19 1303 05/11/19 1437  BNP 211.0* 1,643.2*    DDimer No results for input(s): DDIMER in the last 168 hours.   Radiology  Dg Chest Port 1 View  Result Date: 05/12/2019 CLINICAL DATA:  Shortness of breath and lethargy EXAM: PORTABLE CHEST 1 VIEW COMPARISON:  05/10/2019, 03/04/2019 FINDINGS: Hyperinflated lungs with emphysematous disease. Mild scarring at the bases. No consolidation or effusion. Stable cardiomediastinal silhouette with aortic atherosclerosis. Probable skin fold artifact over the right upper and lower chest. IMPRESSION: 1. Hyperinflated lungs with emphysematous disease. No focal pulmonary infiltrate. 2. Suspected skin fold artifact over the right upper lung, no definitive pneumothorax seen. Electronically Signed   By: Donavan Foil M.D.   On: 05/12/2019 16:12   Patient Profile  Amy Chambers is a 85 y.o. female with history of COPD, hypertension, GERD who was admitted as a direct transfer on 9/25 for acute hypoxic respiratory failure secondary to COPD exacerbation versus pneumonia.  She was subsequently found to have elevated cardiac enzymes concerning for non-STEMI.  EKG with lateral wall T wave inversions.  Assessment & Plan   1.  Elevated troponin/shortness of breath/hypoxic respiratory failure/systolic heart failure -Her echocardiogram has not been formally read, we will get this done today -On my personal review she has an ejection fraction around 45% with hypokinesis of the septum -We will continue heparin and  aspirin therapy -Increased her Lipitor to 40 mg daily -She will undergo left heart catheterization today -She is euvolemic on my examination -She is on an ACE inhibitor -We will need to think about re-challenging her with a beta-blocker -She remains chest pain-free at this time  2.  Acute  on chronic hypoxic respiratory failure -She has been treated for COPD exacerbation, and I will leave this up to the hospital medicine team -Would be beneficial to get her off IV steroids but will defer this to the primary team  -I suspect there was an element of volume but this is improved with Lasix we will continue to hold diuresis for now  3.  Hypertension -Continue ACE inhibitor for now -We will likely plan to rechallenge beta-blocker  For questions or updates, please contact [facility] Please consult [email or web address] for contact info under    Signed, Gerri Spore T. Flora Lipps, MD [facility]  [facility]  05/14/2019 9:09 AM

## 2019-05-14 NOTE — Significant Event (Signed)
Called by staff to assess Amy Chambers for hypotension post femoral cath. BP ~ 80/40. Concerns for RP bleed. She was given several boluses of IVF without improvement. She was started on a levophed drip. A stat central line was placed in the femoral vein on the left side. Her femoral sheath remains in the R femoral artery. We will proceed with stat CT A/P to look for RP bleed. Vascular surgery on call notified and will await scan results. Will pursue supportive care with IVF. I have ordered 2 units of stat pRBCs as well. I notified her daughter of her condition and plan moving forward.   Evalina Field, MD

## 2019-05-14 NOTE — Progress Notes (Addendum)
Pt begin to have 10 out 10 left back and flank pain.  Inform Dr Claiborne Billings.  Pt became hypotensive and diaphoretic and pale.  Pt was put in reverse [place] and IVF bolus.  Sharrell Ku PA came to assess pt alone with Dr. Audie Box.  Levophed started per MD order, blood drawn and sent to lab, O2 via NRB face mask .Central line inserted left femoral by Dr Audie Box.  Pt to CT scan via bed and then to ICU.  Pt remain talkative and alert X4. Report given to [facility]. Radial site level 1 from a bruise no hematoma noted. Right groin with 6 french sheath intact with small ooze, level 0.   Pt had 2 large dark stools.

## 2019-05-14 NOTE — Progress Notes (Signed)
Cardiology Progress Note  Patient ID: Amy Chambers MRN: [id number] DOB: 12/26/1927 Date of Encounter: 05/14/2019  Primary Cardiologist: No primary care provider on file.  Subjective  N.p.o. for left heart cath today.  Reports no chest pain or trouble breathing.  She reports pain in her head.  ROS:  All other ROS reviewed and negative. Pertinent positives noted in the HPI.     Inpatient Medications  Scheduled Meds: . aspirin  81 mg Oral Daily  . atorvastatin  40 mg Oral q1800  . benazepril  20 mg Oral Daily  . guaiFENesin  1,200 mg Oral BID  . isosorbide mononitrate  15 mg Oral Daily  . levothyroxine  75 mcg Oral Daily  . loratadine  10 mg Oral Daily  . methylPREDNISolone (SOLU-MEDROL) injection  60 mg Intravenous Q12H  . mometasone-formoterol  2 puff Inhalation BID  . pantoprazole  40 mg Oral Q0600  . polyethylene glycol  17 g Oral Daily  . sodium chloride flush  3 mL Intravenous Q12H   Continuous Infusions: . sodium chloride    . sodium chloride Stopped (05/14/19 0605)  . sodium chloride 75 mL/hr at 05/14/19 0605  . cefTRIAXone (ROCEPHIN)  IV 1 g (05/13/19 1628)  . heparin 700 Units/hr (05/14/19 0741)   PRN Meds: sodium chloride, acetaminophen, albuterol, nitroGLYCERIN, ondansetron **OR** ondansetron (ZOFRAN) IV, sodium chloride flush, traZODone   Vital Signs   Vitals:   05/14/19 0127 05/14/19 0658 05/14/19 0749 05/14/19 0816  BP:  (!) 158/79 (!) 160/78   Pulse:  72 72   Resp:   20   Temp:  97.7 F (36.5 C)    TempSrc:  Oral    SpO2:  100% 99% 100%  Weight: 57.2 kg     Height:        Intake/Output Summary (Last 24 hours) at 05/14/2019 0909 Last data filed at 05/14/2019 0600 Gross per 24 hour  Intake 1488.4 ml  Output 350 ml  Net 1138.4 ml   Last 3 Weights 05/14/2019 05/13/2019 05/12/2019  Weight (lbs) 126 lb 1.7 oz 118 lb 6.4 oz 117 lb 14.4 oz  Weight (kg) 57.2 kg 53.706 kg 53.479 kg      Telemetry  Overnight telemetry shows normal sinus rhythm with  frequent PACs heart rate in the 60s, which I personally reviewed.   Physical Exam   Vitals:   05/14/19 0127 05/14/19 0658 05/14/19 0749 05/14/19 0816  BP:  (!) 158/79 (!) 160/78   Pulse:  72 72   Resp:   20   Temp:  97.7 F (36.5 C)    TempSrc:  Oral    SpO2:  100% 99% 100%  Weight: 57.2 kg     Height:         Intake/Output Summary (Last 24 hours) at 05/14/2019 0909 Last data filed at 05/14/2019 0600 Gross per 24 hour  Intake 1488.4 ml  Output 350 ml  Net 1138.4 ml    Last 3 Weights 05/14/2019 05/13/2019 05/12/2019  Weight (lbs) 126 lb 1.7 oz 118 lb 6.4 oz 117 lb 14.4 oz  Weight (kg) 57.2 kg 53.706 kg 53.479 kg    Body mass index is 23.83 kg/m.  General: Well nourished, well developed, in no acute distress Head: Atraumatic, normal size  Eyes: PEERLA, EOMI  Neck: Supple, no JVD Endocrine: No thryomegaly Cardiac: Normal S1, S2; RRR; no murmurs, rubs, or gallops Lungs: Clear to auscultation bilaterally, no wheezing, rhonchi or rales  Abd: Soft, nontender, no hepatomegaly  Ext: No edema,   pulses 2+ Musculoskeletal: No deformities, BUE and BLE strength normal and equal Skin: Warm and dry, no rashes   Neuro: Alert and oriented to person, place, time, and situation, CNII-XII grossly intact, no focal deficits  Psych: Normal mood and affect   Labs  High Sensitivity Troponin:   Recent Labs  Lab 05/10/19 1303 05/10/19 1527 05/10/19 1906 05/12/19 0653 05/12/19 0908  TROPONINIHS 44* 499* 975* 1,563* 1,288*     Cardiac EnzymesNo results for input(s): TROPONINI in the last 168 hours. No results for input(s): TROPIPOC in the last 168 hours.  Chemistry Recent Labs  Lab 05/10/19 1303 05/11/19 0458 05/13/19 0853 05/14/19 0328  NA 138 141 140 139  K 4.3 4.7 4.2 4.2  CL 104 104 103 105  CO2 23 25 27 27   GLUCOSE 195* 141* 137* 152*  BUN 27* 24* 38* 37*  CREATININE 0.78 0.89 0.76 0.70  CALCIUM 8.6* 9.1 8.8* 8.2*  PROT 7.3  --   --   --   ALBUMIN 4.0  --   --   --   AST 38   --   --   --   ALT 32  --   --   --   ALKPHOS 65  --   --   --   BILITOT 0.5  --   --   --   GFRNONAA >60 57* >60 >60  GFRAA >60 >60 >60 >60  ANIONGAP 11 12 10 7     Hematology Recent Labs  Lab 05/12/19 0444 05/13/19 0853 05/14/19 0328  WBC 11.4* 6.7 6.8  RBC 3.93 3.93 3.67*  HGB 12.1 11.7* 10.9*  HCT 37.7 37.4 35.2*  MCV 95.9 95.2 95.9  MCH 30.8 29.8 29.7  MCHC 32.1 31.3 31.0  RDW 13.4 13.3 13.4  PLT 152 153 137*   BNP Recent Labs  Lab 05/10/19 1303 05/11/19 1437  BNP 211.0* 1,643.2*    DDimer No results for input(s): DDIMER in the last 168 hours.   Radiology  Dg Chest Port 1 View  Result Date: 05/12/2019 CLINICAL DATA:  Shortness of breath and lethargy EXAM: PORTABLE CHEST 1 VIEW COMPARISON:  05/10/2019, 03/04/2019 FINDINGS: Hyperinflated lungs with emphysematous disease. Mild scarring at the bases. No consolidation or effusion. Stable cardiomediastinal silhouette with aortic atherosclerosis. Probable skin fold artifact over the right upper and lower chest. IMPRESSION: 1. Hyperinflated lungs with emphysematous disease. No focal pulmonary infiltrate. 2. Suspected skin fold artifact over the right upper lung, no definitive pneumothorax seen. Electronically Signed   By: Donavan Foil M.D.   On: 05/12/2019 16:12   Patient Profile  Amy Chambers is a 85 y.o. female with history of COPD, hypertension, GERD who was admitted as a direct transfer on 9/25 for acute hypoxic respiratory failure secondary to COPD exacerbation versus pneumonia.  She was subsequently found to have elevated cardiac enzymes concerning for non-STEMI.  EKG with lateral wall T wave inversions.  Assessment & Plan   1.  Elevated troponin/shortness of breath/hypoxic respiratory failure/systolic heart failure -Her echocardiogram has not been formally read, we will get this done today -On my personal review she has an ejection fraction around 45% with hypokinesis of the septum -We will continue heparin and  aspirin therapy -Increased her Lipitor to 40 mg daily -She will undergo left heart catheterization today -She is euvolemic on my examination -She is on an ACE inhibitor -We will need to think about re-challenging her with a beta-blocker -She remains chest pain-free at this time  2.  Acute  on chronic hypoxic respiratory failure -She has been treated for COPD exacerbation, and I will leave this up to the hospital medicine team -Would be beneficial to get her off IV steroids but will defer this to the primary team  -I suspect there was an element of volume but this is improved with Lasix we will continue to hold diuresis for now  3.  Hypertension -Continue ACE inhibitor for now -We will likely plan to rechallenge beta-blocker  For questions or updates, please contact [facility] Please consult [email or web address] for contact info under    Signed, Gerri Spore T. Flora Lipps, MD [facility]  [facility]  05/14/2019 9:09 AM

## 2019-05-14 NOTE — Progress Notes (Signed)
Spoke with Santiago Glad in cath lab. She stated to take pt's daughter Madlyn Frankel to [facility] waiting area for MD to come speak with daughter and update her on plan of care. RN took daughter to [facility] waiting area and explained to her that the MD will come speak with her shortly.

## 2019-05-14 NOTE — Progress Notes (Signed)
PROGRESS NOTE  Amy Chambers WUJ:[id number] DOB: Apr 16, 1928 DOA: 05/10/2019 PCP: [name], [name], [name]  HPI/Recap of past 24 hours: Amy Chambers is a 85 y.o. female with a history of COPD, GERD, hypertension, osteopenia, thyroid disease.  Patient seen due to acute onset of shortness of breath with hypoxia.  Patient had been prescribed Augmentin due to right ear pain after a tele-visit with her PCP.  She took a dose this morning and shortly afterwards began to develop shortness of breath and hypoxia.  EMS was called and found her oxygen saturation in 60s.  She was given steroids, epinephrine and transported to the hospital.  She received more epinephrine here and her breathing began to improve.  No other palliating or provoking factors.  Emergency Department Course: CTA of the chest normal.  CT of the neck shows no abnormalities.  Chest x-ray normal.  White count slightly elevated initial troponin 40 with a repeat of 500.  Lactic acid normal.  05/14/19: Patient seen and examined at bedside this morning.  No acute events overnight.  She reports no chest pain, cough is intermittent and nonproductive.  Cardiac cath planned today.   Assessment/Plan: Principal Problem:   Acute respiratory failure with hypoxia and hypercapnia (HCC) Active Problems:   HTN (hypertension)   Hyperlipemia   Hypothyroidism   GERD (gastroesophageal reflux disease)   NSTEMI (non-ST elevated myocardial infarction) (HCC)  Persistent acute hypoxic respiratory failure suspect multifactorial secondary to acute COPD exacerbation versus unclear allergic reaction to Augmentin versus other Not on oxygen supplementation at baseline Currently requiring 2 L to maintain oxygen saturation greater than 92% On Rocephin, day #3, started on 05/11/19 for COPD exacerbation Continue Dulera Continue IV Solu-Medrol 60 mg twice daily, wean off IV steroids and start prednisone 40 mg daily x5 days Continue p.o. Protonix for GI prophylaxis  while on steroids  Continue to maintain O2 saturation greater than 92%  Acute COPD exacerbation Management as stated above C/w flutter valve Wean off Solu-Medrol and start prednisone 40 mg daily x5 days  Chronic cough/bronchitis Management per above Continue to treat symptomatically  Elevated troponin Peaked at 1563 and trended down Seen by cardiology, awaiting 2D echo results Currently on heparin drip Denies anginal symptoms at this time. T wave abnormalities in inferior and lateral leads personally reviewed twelve-lead EKG from 05/12/2019. Left heart cath planned today. Normal saline at 75 cc/h x 1 day ordered peri-cath. Monitor renal function post-cath  Physical debility PT OT recommended home health PT OT Also recommended DME rolling walker 5 inches wheels Continue physical therapy Continue fall precautions  Hypothyroidism Continue levothyroxine  Elevated BNP Presented with BNP greater than 1643 2D echo done on 05/11/2019 pending results  Essential hypertension Continue benazepril and Imdur Obtain BMP in the morning   DVT prophylaxis:  Heparin drip Code Status:   Code Status: Full Code   Family Communication:  None at bedside.  Disposition Plan:    Patient is currently not appropriate for discharge due to ongoing evaluation of elevated troponin.  2D echo pending.  Left heart cath planned today.  Will DC home once cardiology signs off.  Consultants: Cardiology   Objective: Vitals:   05/14/19 0749 05/14/19 0816 05/14/19 0942 05/14/19 1219  BP: (!) 160/78  (!) 113/94   Pulse: 72  72   Resp: 20  19   Temp:   97.6 F (36.4 C)   TempSrc:   Oral   SpO2: 99% 100% 100% 99%  Weight:  Height:        Intake/Output Summary (Last 24 hours) at 05/14/2019 1307 Last data filed at 05/14/2019 1100 Gross per 24 hour  Intake 1128.4 ml  Output 475 ml  Net 653.4 ml   Filed Weights   05/12/19 0608 05/13/19 0341 05/14/19 0127  Weight: 53.5 kg 53.7 kg 57.2 kg     Exam:  . General: 85 y.o. year-old female well-developed well-nourished no acute distress.  Alert and oriented x3.   . Cardiovascular: Regular rate and rhythm no rubs or gallops no JVD or thyromegaly noted.   Marland Kitchen Respiratory: No rales.  No wheezing.  Poor respiratory effort. .   Abdomen: Soft nontender nondistended normal bowel sounds present.   . Musculoskeletal: No lower extremity edema.  Peripheral pulses in all 4 extremities. Psychiatry: Mood is appropriate for condition and setting.  Data Reviewed: CBC: Recent Labs  Lab 05/10/19 1303 05/11/19 0458 05/12/19 0444 05/13/19 0853 05/14/19 0328  WBC 22.2* 10.3 11.4* 6.7 6.8  NEUTROABS 12.6*  --   --   --   --   HGB 13.9 13.4 12.1 11.7* 10.9*  HCT 46.3* 41.5 37.7 37.4 35.2*  MCV 98.7 94.3 95.9 95.2 95.9  PLT 320 169 152 153 137*   Basic Metabolic Panel: Recent Labs  Lab 05/10/19 1303 05/11/19 0458 05/13/19 0853 05/14/19 0328  NA 138 141 140 139  K 4.3 4.7 4.2 4.2  CL 104 104 103 105  CO2 23 25 27 27   GLUCOSE 195* 141* 137* 152*  BUN 27* 24* 38* 37*  CREATININE 0.78 0.89 0.76 0.70  CALCIUM 8.6* 9.1 8.8* 8.2*   GFR: Estimated Creatinine Clearance: 36 mL/min (by C-G formula based on SCr of 0.7 mg/dL). Liver Function Tests: Recent Labs  Lab 05/10/19 1303  AST 38  ALT 32  ALKPHOS 65  BILITOT 0.5  PROT 7.3  ALBUMIN 4.0   No results for input(s): LIPASE, AMYLASE in the last 168 hours. No results for input(s): AMMONIA in the last 168 hours. Coagulation Profile: No results for input(s): INR, PROTIME in the last 168 hours. Cardiac Enzymes: No results for input(s): CKTOTAL, CKMB, CKMBINDEX, TROPONINI in the last 168 hours. BNP (last 3 results) No results for input(s): PROBNP in the last 8760 hours. HbA1C: No results for input(s): HGBA1C in the last 72 hours. CBG: No results for input(s): GLUCAP in the last 168 hours. Lipid Profile: No results for input(s): CHOL, HDL, LDLCALC, TRIG, CHOLHDL, LDLDIRECT in the last  72 hours. Thyroid Function Tests: No results for input(s): TSH, T4TOTAL, FREET4, T3FREE, THYROIDAB in the last 72 hours. Anemia Panel: No results for input(s): VITAMINB12, FOLATE, FERRITIN, TIBC, IRON, RETICCTPCT in the last 72 hours. Urine analysis:    Component Value Date/Time   COLORURINE YELLOW 05/10/2019 1741   APPEARANCEUR CLEAR 05/10/2019 1741   APPEARANCEUR Clear 02/22/2019 1546   LABSPEC >1.046 (H) 05/10/2019 1741   PHURINE 5.0 05/10/2019 1741   GLUCOSEU NEGATIVE 05/10/2019 1741   HGBUR SMALL (A) 05/10/2019 1741   BILIRUBINUR NEGATIVE 05/10/2019 1741   BILIRUBINUR Negative 02/22/2019 1546   KETONESUR 20 (A) 05/10/2019 1741   PROTEINUR 100 (A) 05/10/2019 1741   UROBILINOGEN negative 01/05/2015 1225   NITRITE NEGATIVE 05/10/2019 1741   LEUKOCYTESUR NEGATIVE 05/10/2019 1741   Sepsis Labs: @LABRCNTIP (procalcitonin:4,lacticidven:4)  ) Recent Results (from the past 240 hour(s))  SARS Coronavirus 2 [facility] order, Performed in [facility] hospital lab) Nasopharyngeal Nasopharyngeal Swab     Status: None   Collection Time: 05/10/19  1:04 PM  Specimen: Nasopharyngeal Swab  Result Value Ref Range Status   SARS Coronavirus 2 NEGATIVE NEGATIVE Final    Comment: (NOTE) If result is NEGATIVE SARS-CoV-2 target nucleic acids are NOT DETECTED. The SARS-CoV-2 RNA is generally detectable in upper and lower  respiratory specimens during the acute phase of infection. The lowest  concentration of SARS-CoV-2 viral copies this assay can detect is 250  copies / mL. A negative result does not preclude SARS-CoV-2 infection  and should not be used as the sole basis for treatment or other  patient management decisions.  A negative result may occur with  improper specimen collection / handling, submission of specimen other  than nasopharyngeal swab, presence of viral mutation(s) within the  areas targeted by this assay, and inadequate number of viral copies  (<250 copies / mL). A negative  result must be combined with clinical  observations, patient history, and epidemiological information. If result is POSITIVE SARS-CoV-2 target nucleic acids are DETECTED. The SARS-CoV-2 RNA is generally detectable in upper and lower  respiratory specimens dur ing the acute phase of infection.  Positive  results are indicative of active infection with SARS-CoV-2.  Clinical  correlation with patient history and other diagnostic information is  necessary to determine patient infection status.  Positive results do  not rule out bacterial infection or co-infection with other viruses. If result is PRESUMPTIVE POSTIVE SARS-CoV-2 nucleic acids MAY BE PRESENT.   A presumptive positive result was obtained on the submitted specimen  and confirmed on repeat testing.  While 2019 novel coronavirus  (SARS-CoV-2) nucleic acids may be present in the submitted sample  additional confirmatory testing may be necessary for epidemiological  and / or clinical management purposes  to differentiate between  SARS-CoV-2 and other Sarbecovirus currently known to infect humans.  If clinically indicated additional testing with an alternate test  methodology [phone]) is advised. The SARS-CoV-2 RNA is generally  detectable in upper and lower respiratory sp ecimens during the acute  phase of infection. The expected result is Negative. Fact Sheet for Patients:  BoilerBrush.com.cy Fact Sheet for Healthcare Providers: https://pope.com/ This test is not yet approved or cleared by the [place] FDA and has been authorized for detection and/or diagnosis of SARS-CoV-2 by FDA under an Emergency Use Authorization (EUA).  This EUA will remain in effect (meaning this test can be used) for the duration of the COVID-19 declaration under Section 564(b)(1) of the Act, 21 U.S.C. section 360bbb-3(b)(1), unless the authorization is terminated or revoked sooner. Performed at [facility], [address] [place], [place] [address]   Blood culture (routine x 2)     Status: None (Preliminary result)   Collection Time: 05/10/19  3:34 PM   Specimen: Right Antecubital; Blood  Result Value Ref Range Status   Specimen Description RIGHT ANTECUBITAL  Final   Special Requests   Final    BOTTLES DRAWN AEROBIC AND ANAEROBIC Blood Culture adequate volume   Culture   Final    NO GROWTH 4 DAYS Performed at [facility], [address], [place], [place] [address]    Report Status PENDING  Incomplete  Blood culture (routine x 2)     Status: None (Preliminary result)   Collection Time: 05/10/19  3:41 PM   Specimen: BLOOD RIGHT HAND  Result Value Ref Range Status   Specimen Description BLOOD RIGHT HAND  Final   Special Requests   Final    BOTTLES DRAWN AEROBIC ONLY Blood Culture results may not be optimal due to  an inadequate volume of blood received in culture bottles   Culture   Final    NO GROWTH 4 DAYS Performed at [facility], [address], [place], [place] [address]    Report Status PENDING  Incomplete      Studies: No results found.  Scheduled Meds: . [MAR Hold] aspirin  81 mg Oral Daily  . [MAR Hold] atorvastatin  40 mg Oral q1800  . [MAR Hold] benazepril  20 mg Oral Daily  . [MAR Hold] guaiFENesin  1,200 mg Oral BID  . [MAR Hold] isosorbide mononitrate  15 mg Oral Daily  . [MAR Hold] levothyroxine  75 mcg Oral Daily  . [MAR Hold] loratadine  10 mg Oral Daily  . [MAR Hold] methylPREDNISolone (SOLU-MEDROL) injection  60 mg Intravenous Q12H  . [MAR Hold] mometasone-formoterol  2 puff Inhalation BID  . [MAR Hold] pantoprazole  40 mg Oral Q0600  . [MAR Hold] polyethylene glycol  17 g Oral Daily  . [MAR Hold] sodium chloride flush  3 mL Intravenous Q12H    Continuous Infusions: . sodium chloride    . sodium chloride Stopped (05/14/19 0605)  . sodium chloride 75 mL/hr at 05/14/19 0605  . [MAR Hold] cefTRIAXone (ROCEPHIN)  IV 1 g (05/13/19 1628)  . heparin  Stopped (05/14/19 1145)     LOS: 4 days     Kayleen Memos, MD [facility] Pager [phone]  If 7PM-7AM, please contact night-coverage [email or web address] Password [email or web address] 05/14/2019, 1:07 PM

## 2019-05-14 NOTE — Significant Event (Signed)
Discussed recent events with Amy Chambers's daughter. CT A/P without acute RP bleed, but noted to have hemopericardium. CT chest dissection performed that did not reveal a Type A dissection. TTE performed that showed evidence of pericardial effusion with likely thrombus and signs of cardiac tamponade (RV collapse, >30% variation in mitral inflow, plethoric IVC, absent hepatic flow in diastole).   Due to clinical state of cardiogenic shock with tamponade, I asked interventional cardiology (Dr. Ellyn Hack) and cardiac surgery (Dr. Kipp Brood) to evaluate Mrs. Mayo. We discussed the need to re-evaluate the coronaries to look for any source of bleeding. We also discussed the need to evacuate the pericardium. It was felt that a repeat coronary angiogram will need to be performed to determine if a coronary etiology for the bleed is present. She will then need evacuation of the pericardium. We will hold her antiplatelet agents in anticipation for pericardial evacuation. Dr. Kipp Brood will consider an off pump CABG x 1 as she will likely develop acute stent thrombosis of her newly placed stent with DAPT held.   The above plan was discussed with her Daughter Rosalene Billings [phone]), and she was updated on her mother currently critically ill state.   For now, she will remain on levophed and we will trend her CBCs. She has received 2 units of pRBCs. She is typed and crossed. She will received platelets in the operating room per recommendations by CT surgery.   Evalina Field, MD

## 2019-05-14 NOTE — Consult Note (Signed)
Brief interventional cardiology consultation note NAME:  Amy Chambers   MRN: [id number] DOB:  1928/01/09   ADMIT DATE: 05/10/2019   05/14/2019 9:35 PM    HPI: Amy Chambers is a 85 y.o. female with history of COPD and GERD who was admitted as a direct transfer for acute hypoxic [place] failure secondary COP exacerbation versus pneumonia.  She is found to have elevated troponins consistent with non-STEMI and was referred for cardiac catheterization with possible PCI today performed with Dr. Nicki Guadalajara.  Cardiac cath showed proximal to mid RCA 80% stenosis treated with difficult PCI and 2 overlapping Synergy DES stents 3.5 mm x 20 mm and 3.0 mm 12 mm postdilated to 3.6-3.5 mm (requiring guideliner).  Patient did relatively well initially post cath, however Dr. Lennie Odor was called to the bedside for acute decompensation with hypotension blood pressures ranging in the 80s over 40s.  She was resuscitated with IV fluids.  She has stat central line placed.  Initial CT of the abdomen pelvis was performed look for retroperitoneal bleed given femoral access which did not show any evidence of bleed.  She was found to have a 3 unit drop in hemoglobin and was given at least 2 units of stat PRBC.  Follow-up evaluation with a bedside echocardiogram suggested pericardial effusion.  After discussions with Dr. Cliffton Asters from CT surgery, first a CT angiogram of the chest was performed to exclude dissection followed by a bedside echo confirming a relatively large pericardial effusion with what appears to be coagulating blood suggestive of hemopericardium.  The patient is on Levophed with blood pressures in the 110s and heart rate in the 120s.  There is clear evidence by echocardiogram for tamponade physiology.  After discussion with myself and Dr. Gillis Santa along with Dr. Cliffton Asters, we felt like the patient would likely and eventually require a cardiac window, but we wanted to perform diagnostic coronary angiography  to exclude ongoing bleeding from the RCA which was stented.  The feeling was that while pericardiocentesis may be a rapid approach, given the amount of thrombus apparent, would likely not be the definitive treatment.  Past Medical History:  Diagnosis Date  . Cataract   . COPD (chronic obstructive pulmonary disease) (HCC)   . GERD (gastroesophageal reflux disease)   . Hypertension   . Osteopenia   . Thyroid disease    Past Surgical History:  Procedure Laterality Date  . CATARACT EXTRACTION, BILATERAL      FAMHx: Family History  Problem Relation Age of Onset  . Diabetes Mother   . Cancer Brother   . Tuberculosis Father   . Early death Sister   . Early death Brother   . COPD Brother   . Emphysema Brother   . Early death Sister   . COPD Sister   . Emphysema Sister   . Aneurysm Sister   . Stroke Sister   . Cancer Sister   . Hypertension Son   . Hypertension Daughter   . Fibromyalgia Daughter   . Hypertension Daughter     SOCHx:  reports that she quit smoking about 24 years ago. She quit smokeless tobacco use about 20 years ago.  Her smokeless tobacco use included chew. She reports that she does not drink alcohol or use drugs.  ALLERGIES: Allergies  Allergen Reactions  . Crestor [Rosuvastatin Calcium] Other (See Comments)    Cramping/pain in lower extremeties  . Niaspan [Niacin Er] Other (See Comments)    Cramping/pain in lower extremeties    HOME  MEDICATIONS: Medications Prior to Admission  Medication Sig Dispense Refill Last Dose  . albuterol (VENTOLIN HFA) 108 (90 Base) MCG/ACT inhaler INHALE 2 PUFFS INTO LUNGS EVERY 6 HOURS AS NEEDED FOR WHEEZING (Patient taking differently: Inhale 2 puffs into the lungs every 6 (six) hours as needed for wheezing or shortness of breath. ) 9 g 2 05/10/2019 at Unknown time  . amoxicillin-clavulanate (AUGMENTIN) 875-125 MG tablet Take 1 tablet by mouth 2 (two) times daily. 14 tablet 0 05/10/2019 at 1030  . aspirin 81 MG tablet Take  81 mg by mouth daily.   05/10/2019 at 0600  . benazepril (LOTENSIN) 20 MG tablet Take 1 tablet (20 mg total) by mouth daily. 90 tablet 1 05/10/2019 at 0600  . budesonide-formoterol (SYMBICORT) 160-4.5 MCG/ACT inhaler Inhale 2 puffs into the lungs 2 (two) times daily. 1 Inhaler 3 05/10/2019 at Unknown time  . calcium-vitamin D 250-100 MG-UNIT per tablet Take 1 tablet by mouth 2 (two) times daily.    05/10/2019 at Unknown time  . fluticasone (FLONASE) 50 MCG/ACT nasal spray Place 2 sprays into both nostrils daily. 16 g 6 05/10/2019 at Unknown time  . levothyroxine (SYNTHROID) 75 MCG tablet Take 1 tablet (75 mcg total) by mouth daily. 90 tablet 1 05/10/2019 at 0600  . polyethylene glycol (MIRALAX / GLYCOLAX) packet Take 17 g by mouth daily.   05/10/2019 at Unknown time  . VITAMIN D PO Take 1 capsule by mouth daily.   05/10/2019 at Unknown time    ROS mostly notes back pain, some dyspnea but no obvious chest pain or pressure.  PHYSICAL EXAM:Blood pressure (!) 82/64, pulse (!) 118, temperature (!) 97.3 F (36.3 C), temperature source Rectal, resp. rate (!) 33, height 5\' 1"  (1.549 m), weight 57.2 kg, SpO2 100 %. General appearance: She is awake and alert.  Oriented and 3, but little bit confused.  Mostly complaining of back pain Neck: There is evidence of mild JVD, difficult to assess due to how she is lying Heart: Tachycardia, unable to truly auscultate any pericardial friction rub but no gallops noted.  S1 and S2 seem normal. Abdomen: Mild tenderness palpation but otherwise soft with mildly decreased but bowel sounds Extremities: Cool lower extremities but with palpable pedal pulses.  Right femoral arterial line and left femoral venous line in place. Pulses: Palpable, but diminished pedal pulses Neurologic: Mental status: Alert, oriented, thought content appropriate, Mildly confused  Telemetry shows sinus tachycardia with rates in the 120s  IMPRESSION & PLAN The patients' history has been reviewed,  patient examined, no change in status from most recent note, stable for surgery. I have reviewed the patients' chart and labs. Questions were answered to the patient's satisfaction.    Marcy Panning Knepp has presented today for Cardiac Catheterization, with the diagnosis of pericardial tamponade status post recent DES PCI to the RCA The various methods of treatment have been discussed with the patient and family.   Risks / Complications include, but not limited to: Death, MI, CVA/TIA, VF/VT (with defibrillation), Bradycardia (need for temporary pacer placement), contrast induced nephropathy, bleeding / bruising / hematoma / pseudoaneurysm, vascular or coronary injury (with possible emergent CT or Vascular Surgery), adverse medication reactions, infection.     After consideration of risks, benefits and other options for treatment, the patient has consented to Procedure(s):  LEFT HEART CATHETERIZATION AND CORONARY ANGIOGRAPHY +/- AD [place]  as a surgical intervention.   We will proceed with the planned procedure.   [place]  [facility] [address] [place], [place]  [address]  [phone]  05/14/2019 9:35 PM  

## 2019-05-14 NOTE — Interval H&P Note (Signed)
Cath Lab Visit (complete for each Cath Lab visit)  Clinical Evaluation Leading to the Procedure:   ACS: Yes.    Non-ACS:    Anginal Classification: CCS IV  Anti-ischemic medical therapy: Minimal Therapy (1 class of medications)  Non-Invasive Test Results: No non-invasive testing performed  Prior CABG: No previous CABG      History and Physical Interval Note:  05/14/2019 12:32 PM  Amy Chambers  has presented today for surgery, with the diagnosis of N STEMI.  The various methods of treatment have been discussed with the patient and family. After consideration of risks, benefits and other options for treatment, the patient has consented to  Procedure(s): LEFT HEART CATH AND CORONARY ANGIOGRAPHY (N/A) as a surgical intervention.  The patient's history has been reviewed, patient examined, no change in status, stable for surgery.  I have reviewed the patient's chart and labs.  Questions were answered to the patient's satisfaction.     Shelva Majestic

## 2019-05-14 NOTE — Procedures (Signed)
Central Venous Catheter Insertion Procedure Note Amy Chambers [id number] 08-Jun-1928  Procedure: Insertion of Central Venous Catheter Indications: Drug and/or fluid administration  Procedure Details Consent: Unable to obtain consent because of emergent medical necessity. Time Out: Verified patient identification, verified procedure, site/side was marked, verified correct patient position, special equipment/implants available, medications/allergies/relevent history reviewed, required imaging and test results available.  Performed  Maximum sterile technique was used including antiseptics, cap, gloves, gown, hand hygiene, mask and sheet. Skin prep: Chlorhexidine; local anesthetic administered A antimicrobial bonded/coated triple lumen catheter was placed in the left femoral vein due to emergent situation using the Seldinger technique.  Evaluation Blood flow good Complications: No apparent complications Patient did tolerate procedure well. Chest X-ray ordered to verify placement.  CXR: CXR not needed due to femoral vein; confirmed by ABG. Amy Chambers 05/14/2019, 7:18 PM

## 2019-05-14 NOTE — Progress Notes (Signed)
Pt's daughter, Madlyn Frankel sitting in patient's room waiting for her mom to return from cath lab. Daughter concerned about her mom not returning and asking questions. RN called cath lab to obtain information. Santiago Glad in cath lab stated she would have MD come speak with daughter ASAP.

## 2019-05-14 NOTE — TOC Initial Note (Addendum)
Transition of Care [facility]) - Initial/Assessment Note    Patient Details  Name: Amy Chambers MRN: [id number] Date of Birth: 1928-05-29  Transition of Care [facility] CM/SW Contact:    Bethena Roys, RN Phone Number: 05/14/2019, 1:08 PM  Clinical Narrative: Pt presented for Acute Respiratory Failure. PTA from home alone with support of daughters. Pt is agreeable to [facility]. Patient has used [facility] in the past and wants to use again. Referral sent to [facility] with [facility] and SOC to begin within 24-48 hours post transition home. CM will follow for DME 02 needs.             05-15-19 Pt has DME RW in the home that she does not use.   Expected Discharge Plan: [place] Barriers to Discharge: No Barriers Identified   Patient Goals and CMS Choice Patient states their goals for this hospitalization and ongoing recovery are:: "to return home" CMS Medicare.gov Compare Post Acute Care list provided to:: Patient Choice offered to / list presented to : Patient  Expected Discharge Plan and Services Expected Discharge Plan: [place] In-house Referral: NA Discharge Planning Services: CM Consult Post Acute Care Choice: [place] arrangements for the past 2 months: [place]: PT HH Agency: [place] ([place]) Date HH Agency Contacted: 05/14/19 Time [place]: 1301 Representative spoke with at [place]: [place] Arrangements/Services Living arrangements for the past 2 months: [place] with:: Self(family lives nearby and can assist.) Patient language and need for interpreter reviewed:: Yes Do you feel safe going back to the place where you live?: Yes      Need for Family Participation in Patient Care: Yes (Comment) Care giver support system in place?: Yes (comment)   Criminal Activity/Legal Involvement Pertinent to Current Situation/Hospitalization: No -  Comment as needed  Activities of Daily Living Home Assistive Devices/Equipment: Environmental consultant (specify type), Cane (specify quad or straight) ADL Screening (condition at time of admission) Patient's cognitive ability adequate to safely complete daily activities?: Yes Is the patient deaf or have difficulty hearing?: No Does the patient have difficulty seeing, even when wearing glasses/contacts?: No Does the patient have difficulty concentrating, remembering, or making decisions?: No Patient able to express need for assistance with ADLs?: Yes Does the patient have difficulty dressing or bathing?: Yes(pt states she "sponges off" at home independently) Independently performs ADLs?: Yes (appropriate for developmental age) Does the patient have difficulty walking or climbing stairs?: Yes Weakness of Legs: Both Weakness of Arms/Hands: None  Permission Sought/Granted Permission sought to share information with : Family Supports, Chartered certified accountant granted to share information with : Yes, Verbal Permission Granted     Permission granted to share info w AGENCY: [facility]        Emotional Assessment Appearance:: Appears stated age Attitude/Demeanor/Rapport: Gracious Affect (typically observed): Accepting Orientation: : Oriented to Self, Oriented to Place, Oriented to  Time, Oriented to Situation Alcohol / Substance Use: Not Applicable Psych Involvement: No (comment)  Admission diagnosis:  SOB (shortness of breath) [R06.02] Acute respiratory failure with hypoxia (HCC) [J96.01] Troponin I above reference range [R79.89] Acute encephalopathy [G93.40] COPD with acute exacerbation (HCC) [J44.1] Leukocytosis, unspecified type [D72.829] NSTEMI (non-ST elevated myocardial infarction) [facility] [I21.4] Patient Active Problem List   Diagnosis Date Noted  . Acute respiratory failure with  hypoxia and hypercapnia (HCC) 05/10/2019  . NSTEMI (non-ST elevated myocardial  infarction) (HCC) 05/10/2019  . Simple chronic bronchitis (HCC) 05/24/2018  . HTN (hypertension) 12/01/2010  . Hyperlipemia 12/01/2010  . Hypothyroidism 12/01/2010  . Osteopenia 12/01/2010  . GERD (gastroesophageal reflux disease) 12/01/2010   PCP:  Bennie Pierini, [name] Pharmacy:   [phone] - [place], [place] - [address] [place] [address] Phone: [phone] Fax: [phone]  [facility] [address], [place] - [address] [place] [address] Phone: [phone] Fax: [phone]     Social Determinants of Health (SDOH) Interventions    Readmission Risk Interventions Readmission Risk Prevention Plan 05/14/2019  Transportation Screening Complete  Home Care Screening Complete  Medication Review (RN CM) Complete  Some recent data might be hidden

## 2019-05-14 NOTE — Progress Notes (Signed)
[place] for Heparin  Indication: NSTEMI  Allergies  Allergen Reactions  . Augmentin [Amoxicillin-Pot Clavulanate]   . Crestor [Rosuvastatin Calcium] Other (See Comments)    Cramping/pain in lower extremeties  . Niaspan [Niacin Er] Other (See Comments)    Cramping/pain in lower extremeties    Patient Measurements: Height: 5\' 1"  (154.9 cm) Weight: 126 lb 1.7 oz (57.2 kg) IBW/kg (Calculated) : 47.8 Heparin Dosing Weight:  HEPARIN DW (KG): 52.7  Vital Signs: Temp: 97.7 F (36.5 C) (09/29 0658) Temp Source: Oral (09/29 0658) BP: 158/79 (09/29 0658) Pulse Rate: 72 (09/29 0658)  Labs: Recent Labs    05/12/19 0444 05/12/19 0653 05/12/19 0908 05/13/19 0853 05/14/19 0328  HGB 12.1  --   --  11.7* 10.9*  HCT 37.7  --   --  37.4 35.2*  PLT 152  --   --  153 137*  HEPARINUNFRC 0.62  --   --  0.48 0.70  CREATININE  --   --   --  0.76 0.70  TROPONINIHS  --  1,563* 1,288*  --   --     Estimated Creatinine Clearance: 36 mL/min (by C-G formula based on SCr of 0.7 mg/dL).   Medical History: Past Medical History:  Diagnosis Date  . Cataract   . COPD (chronic obstructive pulmonary disease) ([place])   . GERD (gastroesophageal reflux disease)   . Hypertension   . Osteopenia   . Thyroid disease       Assessment: Pharmacy consulted to dose heparin infusion for this 85 yo female with elevated high-sensitivity Troponins. Patient wasn't on any anti-coagulation prior to admission.  Heparin level at upper end of goal this morning at 0.7. Hgb trending down to 10.9, plt ok at 137. No bleeding noted.   Goal of Therapy:  Heparin level 0.3-0.7 units/ml Monitor platelets by anticoagulation protocol: Yes   Plan:  Reduce heparin to 700 units/hr Plan for cath today  Thank you, Erin Hearing PharmD., BCPS Clinical Pharmacist 05/14/2019 7:23 AM

## 2019-05-15 ENCOUNTER — Inpatient Hospital Stay (HOSPITAL_COMMUNITY): Payer: Medicare Other

## 2019-05-15 ENCOUNTER — Encounter (HOSPITAL_COMMUNITY): Payer: Self-pay | Admitting: Cardiology

## 2019-05-15 ENCOUNTER — Other Ambulatory Visit: Payer: Self-pay

## 2019-05-15 DIAGNOSIS — J9589 Other postprocedural complications and disorders of respiratory system, not elsewhere classified: Secondary | ICD-10-CM

## 2019-05-15 DIAGNOSIS — J441 Chronic obstructive pulmonary disease with (acute) exacerbation: Secondary | ICD-10-CM

## 2019-05-15 LAB — POCT I-STAT 7, (LYTES, BLD GAS, ICA,H+H)
Acid-base deficit: 8 mmol/L — ABNORMAL HIGH (ref 0.0–2.0)
Acid-base deficit: 8 mmol/L — ABNORMAL HIGH (ref 0.0–2.0)
Acid-base deficit: 3 mmol/L — ABNORMAL HIGH (ref 0.0–2.0)
Acid-base deficit: 11 mmol/L — ABNORMAL HIGH (ref 0.0–2.0)
Acid-base deficit: 6 mmol/L — ABNORMAL HIGH (ref 0.0–2.0)
Bicarbonate: 17.8 mmol/L — ABNORMAL LOW (ref 20.0–28.0)
Bicarbonate: 19.5 mmol/L — ABNORMAL LOW (ref 20.0–28.0)
Bicarbonate: 23.2 mmol/L (ref 20.0–28.0)
Bicarbonate: 16.6 mmol/L — ABNORMAL LOW (ref 20.0–28.0)
Bicarbonate: 21.7 mmol/L (ref 20.0–28.0)
Calcium, Ion: 0.96 mmol/L — ABNORMAL LOW (ref 1.15–1.40)
Calcium, Ion: 0.97 mmol/L — ABNORMAL LOW (ref 1.15–1.40)
Calcium, Ion: 0.97 mmol/L — ABNORMAL LOW (ref 1.15–1.40)
Calcium, Ion: 0.94 mmol/L — ABNORMAL LOW (ref 1.15–1.40)
Calcium, Ion: 1.04 mmol/L — ABNORMAL LOW (ref 1.15–1.40)
HCT: 26 % — ABNORMAL LOW (ref 36.0–46.0)
HCT: 32 % — ABNORMAL LOW (ref 36.0–46.0)
HCT: 24 % — ABNORMAL LOW (ref 36.0–46.0)
HCT: 32 % — ABNORMAL LOW (ref 36.0–46.0)
HCT: 35 % — ABNORMAL LOW (ref 36.0–46.0)
Hemoglobin: 10.9 g/dL — ABNORMAL LOW (ref 12.0–15.0)
Hemoglobin: 8.8 g/dL — ABNORMAL LOW (ref 12.0–15.0)
Hemoglobin: 8.2 g/dL — ABNORMAL LOW (ref 12.0–15.0)
Hemoglobin: 10.9 g/dL — ABNORMAL LOW (ref 12.0–15.0)
Hemoglobin: 11.9 g/dL — ABNORMAL LOW (ref 12.0–15.0)
O2 Saturation: 100 %
O2 Saturation: 100 %
O2 Saturation: 100 %
O2 Saturation: 100 %
O2 Saturation: 100 %
Patient temperature: 34
Potassium: 3.9 mmol/L (ref 3.5–5.1)
Potassium: 4.8 mmol/L (ref 3.5–5.1)
Potassium: 3.5 mmol/L (ref 3.5–5.1)
Potassium: 4.2 mmol/L (ref 3.5–5.1)
Potassium: 4.4 mmol/L (ref 3.5–5.1)
Sodium: 140 mmol/L (ref 135–145)
Sodium: 142 mmol/L (ref 135–145)
Sodium: 145 mmol/L (ref 135–145)
Sodium: 139 mmol/L (ref 135–145)
Sodium: 141 mmol/L (ref 135–145)
TCO2: 19 mmol/L — ABNORMAL LOW (ref 22–32)
TCO2: 21 mmol/L — ABNORMAL LOW (ref 22–32)
TCO2: 24 mmol/L (ref 22–32)
TCO2: 18 mmol/L — ABNORMAL LOW (ref 22–32)
TCO2: 23 mmol/L (ref 22–32)
pCO2 arterial: 37.1 mmHg (ref 32.0–48.0)
pCO2 arterial: 46.4 mmHg (ref 32.0–48.0)
pCO2 arterial: 37.9 mmHg (ref 32.0–48.0)
pCO2 arterial: 41 mmHg (ref 32.0–48.0)
pCO2 arterial: 49 mmHg — ABNORMAL HIGH (ref 32.0–48.0)
pH, Arterial: 7.232 — ABNORMAL LOW (ref 7.350–7.450)
pH, Arterial: 7.289 — ABNORMAL LOW (ref 7.350–7.450)
pH, Arterial: 7.38 (ref 7.350–7.450)
pH, Arterial: 7.215 — ABNORMAL LOW (ref 7.350–7.450)
pH, Arterial: 7.254 — ABNORMAL LOW (ref 7.350–7.450)
pO2, Arterial: 358 mmHg — ABNORMAL HIGH (ref 83.0–108.0)
pO2, Arterial: 573 mmHg — ABNORMAL HIGH (ref 83.0–108.0)
pO2, Arterial: 240 mmHg — ABNORMAL HIGH (ref 83.0–108.0)
pO2, Arterial: 246 mmHg — ABNORMAL HIGH (ref 83.0–108.0)
pO2, Arterial: 320 mmHg — ABNORMAL HIGH (ref 83.0–108.0)

## 2019-05-15 LAB — TYPE AND SCREEN
ABO/RH(D): A POS
Antibody Screen: NEGATIVE
Unit division: 0
Unit division: 0
Unit division: 0
Unit division: 0

## 2019-05-15 LAB — CBC
HCT: 28.1 % — ABNORMAL LOW (ref 36.0–46.0)
HCT: 37.6 % (ref 36.0–46.0)
Hemoglobin: 8.7 g/dL — ABNORMAL LOW (ref 12.0–15.0)
Hemoglobin: 12.2 g/dL (ref 12.0–15.0)
MCH: 29.2 pg (ref 26.0–34.0)
MCH: 29.3 pg (ref 26.0–34.0)
MCHC: 31 g/dL (ref 30.0–36.0)
MCHC: 32.4 g/dL (ref 30.0–36.0)
MCV: 94.3 fL (ref 80.0–100.0)
MCV: 90.2 fL (ref 80.0–100.0)
Platelets: 52 10*3/uL — ABNORMAL LOW (ref 150–400)
Platelets: 81 10*3/uL — ABNORMAL LOW (ref 150–400)
RBC: 2.98 MIL/uL — ABNORMAL LOW (ref 3.87–5.11)
RBC: 4.17 MIL/uL (ref 3.87–5.11)
RDW: 16.1 % — ABNORMAL HIGH (ref 11.5–15.5)
RDW: 16.6 % — ABNORMAL HIGH (ref 11.5–15.5)
WBC: 18.7 10*3/uL — ABNORMAL HIGH (ref 4.0–10.5)
WBC: 20.1 10*3/uL — ABNORMAL HIGH (ref 4.0–10.5)
nRBC: 0.1 % (ref 0.0–0.2)
nRBC: 0.2 % (ref 0.0–0.2)

## 2019-05-15 LAB — CULTURE, BLOOD (ROUTINE X 2)
Culture: NO GROWTH
Culture: NO GROWTH
Special Requests: ADEQUATE

## 2019-05-15 LAB — BPAM RBC
Blood Product Expiration Date: 202010212359
Blood Product Expiration Date: 202010222359
Blood Product Expiration Date: 202011012359
Blood Product Expiration Date: 202011012359
ISSUE DATE / TIME: 202009291904
ISSUE DATE / TIME: 202009291948
ISSUE DATE / TIME: 202009291948
ISSUE DATE / TIME: 202009300044
Unit Type and Rh: 5100
Unit Type and Rh: 5100
Unit Type and Rh: 6200
Unit Type and Rh: 6200

## 2019-05-15 LAB — POCT I-STAT, CHEM 8
BUN: 29 mg/dL — ABNORMAL HIGH (ref 8–23)
BUN: 25 mg/dL — ABNORMAL HIGH (ref 8–23)
Calcium, Ion: 0.97 mmol/L — ABNORMAL LOW (ref 1.15–1.40)
Calcium, Ion: 0.89 mmol/L — CL (ref 1.15–1.40)
Chloride: 109 mmol/L (ref 98–111)
Chloride: 108 mmol/L (ref 98–111)
Creatinine, Ser: 0.5 mg/dL (ref 0.44–1.00)
Creatinine, Ser: 0.6 mg/dL (ref 0.44–1.00)
Glucose, Bld: 163 mg/dL — ABNORMAL HIGH (ref 70–99)
Glucose, Bld: 142 mg/dL — ABNORMAL HIGH (ref 70–99)
HCT: 34 % — ABNORMAL LOW (ref 36.0–46.0)
HCT: 24 % — ABNORMAL LOW (ref 36.0–46.0)
Hemoglobin: 11.6 g/dL — ABNORMAL LOW (ref 12.0–15.0)
Hemoglobin: 8.2 g/dL — ABNORMAL LOW (ref 12.0–15.0)
Potassium: 4.8 mmol/L (ref 3.5–5.1)
Potassium: 3.3 mmol/L — ABNORMAL LOW (ref 3.5–5.1)
Sodium: 141 mmol/L (ref 135–145)
Sodium: 145 mmol/L (ref 135–145)
TCO2: 19 mmol/L — ABNORMAL LOW (ref 22–32)
TCO2: 22 mmol/L (ref 22–32)

## 2019-05-15 LAB — ECHOCARDIOGRAM LIMITED
Height: 61 in
Weight: 2017.65 oz

## 2019-05-15 LAB — BASIC METABOLIC PANEL
Anion gap: 8 (ref 5–15)
BUN: 28 mg/dL — ABNORMAL HIGH (ref 8–23)
CO2: 20 mmol/L — ABNORMAL LOW (ref 22–32)
Calcium: 6.5 mg/dL — ABNORMAL LOW (ref 8.9–10.3)
Chloride: 112 mmol/L — ABNORMAL HIGH (ref 98–111)
Creatinine, Ser: 0.89 mg/dL (ref 0.44–1.00)
GFR calc Af Amer: >60 mL/min (ref >60)
GFR calc non Af Amer: 57 mL/min — ABNORMAL LOW (ref >60)
Glucose, Bld: 175 mg/dL — ABNORMAL HIGH (ref 70–99)
Potassium: 4.5 mmol/L (ref 3.5–5.1)
Sodium: 140 mmol/L (ref 135–145)

## 2019-05-15 LAB — LACTIC ACID, PLASMA: Lactic Acid, Venous: 1.9 mmol/L (ref 0.5–1.9)

## 2019-05-15 LAB — POCT ACTIVATED CLOTTING TIME: Activated Clotting Time: 125 seconds

## 2019-05-15 LAB — HEPARIN LEVEL (UNFRACTIONATED): Heparin Unfractionated: <0.1 IU/mL — ABNORMAL LOW (ref 0.30–0.70)

## 2019-05-15 MED ORDER — 0.9 % SODIUM CHLORIDE (POUR BTL) OPTIME
TOPICAL | Status: DC | PRN
  Administered 2019-05-15: 1000 mL

## 2019-05-15 MED ORDER — ETOMIDATE 2 MG/ML IV SOLN
INTRAVENOUS | Status: DC | PRN
  Administered 2019-05-15: 14 mg via INTRAVENOUS

## 2019-05-15 MED ORDER — SODIUM CHLORIDE 0.9 % IV SOLN: 250.0000 mL | INTRAVENOUS | Status: DC | PRN

## 2019-05-15 MED ORDER — TICAGRELOR 90 MG PO TABS
90.0000 mg | ORAL_TABLET | Freq: Two times a day (BID) | ORAL | Status: AC
  Administered 2019-05-16 – 2019-05-21 (×12): 90 mg via ORAL
  Filled 2019-05-15 (×14): qty 1

## 2019-05-15 MED ORDER — SODIUM CHLORIDE (PF) 0.9 % IJ SOLN
OROMUCOSAL | Status: DC | PRN
  Administered 2019-05-15: 4 mL via TOPICAL

## 2019-05-15 MED ORDER — TICAGRELOR 90 MG PO TABS: 90.0000 mg | ORAL_TABLET | Freq: Two times a day (BID) | ORAL | Status: DC

## 2019-05-15 MED ORDER — SODIUM BICARBONATE 8.4 % IV SOLN
INTRAVENOUS | Status: DC | PRN
  Administered 2019-05-15: 50 meq via INTRAVENOUS

## 2019-05-15 MED ORDER — TICAGRELOR 90 MG PO TABS
90.0000 mg | ORAL_TABLET | Freq: Once | ORAL | Status: AC
  Administered 2019-05-15: 17:00:00 90 mg via ORAL
  Filled 2019-05-15: qty 1

## 2019-05-15 MED ORDER — SODIUM CHLORIDE (PF) 0.9 % IJ SOLN
INTRAMUSCULAR | Status: AC
  Filled 2019-05-15: qty 30

## 2019-05-15 MED ORDER — SODIUM CHLORIDE 0.9% FLUSH
3.0000 mL | Freq: Two times a day (BID) | INTRAVENOUS | Status: DC
  Administered 2019-05-15 – 2019-05-20 (×7): 3 mL via INTRAVENOUS

## 2019-05-15 MED ORDER — FENTANYL CITRATE (PF) 250 MCG/5ML IJ SOLN
INTRAMUSCULAR | Status: AC
  Filled 2019-05-15: qty 5

## 2019-05-15 MED ORDER — GUAIFENESIN ER 600 MG PO TB12
600.0000 mg | ORAL_TABLET | Freq: Two times a day (BID) | ORAL | Status: DC
  Administered 2019-05-16 – 2019-05-31 (×31): 600 mg via ORAL
  Filled 2019-05-15 (×32): qty 1

## 2019-05-15 MED ORDER — PHENYLEPHRINE HCL (PRESSORS) 10 MG/ML IV SOLN
INTRAVENOUS | Status: DC | PRN
  Administered 2019-05-15 (×2): 120 ug via INTRAVENOUS

## 2019-05-15 MED ORDER — LACTATED RINGERS IV SOLN
INTRAVENOUS | Status: DC | PRN
  Administered 2019-05-15: via INTRAVENOUS

## 2019-05-15 MED ORDER — CHLORHEXIDINE GLUCONATE 0.12% ORAL RINSE (MEDLINE KIT)
15.0000 mL | Freq: Two times a day (BID) | OROMUCOSAL | Status: DC
  Administered 2019-05-15 – 2019-05-16 (×3): 15 mL via OROMUCOSAL

## 2019-05-15 MED ORDER — FENTANYL CITRATE (PF) 100 MCG/2ML IJ SOLN
INTRAMUSCULAR | Status: AC
  Filled 2019-05-15: qty 2

## 2019-05-15 MED ORDER — FENTANYL CITRATE (PF) 250 MCG/5ML IJ SOLN
INTRAMUSCULAR | Status: DC | PRN
  Administered 2019-05-15: 100 ug via INTRAVENOUS
  Administered 2019-05-15: 50 ug via INTRAVENOUS
  Administered 2019-05-15: 150 ug via INTRAVENOUS
  Administered 2019-05-15 (×2): 100 ug via INTRAVENOUS

## 2019-05-15 MED ORDER — ROCURONIUM 10MG/ML (10ML) SYRINGE FOR MEDFUSION PUMP - OPTIME
INTRAVENOUS | Status: DC | PRN
  Administered 2019-05-15: 50 mg via INTRAVENOUS

## 2019-05-15 MED ORDER — POTASSIUM CHLORIDE 10 MEQ/50ML IV SOLN
10.0000 meq | INTRAVENOUS | Status: AC
  Administered 2019-05-15 (×3): 10 meq via INTRAVENOUS

## 2019-05-15 MED ORDER — LACTATED RINGERS IV SOLN
INTRAVENOUS | Status: DC | PRN
  Administered 2019-05-14: via INTRAVENOUS

## 2019-05-15 MED ORDER — SUCCINYLCHOLINE CHLORIDE 200 MG/10ML IV SOSY
PREFILLED_SYRINGE | INTRAVENOUS | Status: DC | PRN
  Administered 2019-05-15: 100 mg via INTRAVENOUS

## 2019-05-15 MED ORDER — NOREPINEPHRINE BITARTRATE 1 MG/ML IV SOLN
INTRAVENOUS | Status: DC | PRN
  Administered 2019-05-14: 5 ug/min via INTRAVENOUS
  Administered 2019-05-14: 8 ug/min

## 2019-05-15 MED ORDER — MIDAZOLAM HCL 2 MG/2ML IJ SOLN
INTRAMUSCULAR | Status: AC
  Filled 2019-05-15: qty 2

## 2019-05-15 MED ORDER — PNEUMOCOCCAL VAC POLYVALENT 25 MCG/0.5ML IJ INJ
0.5000 mL | INJECTION | INTRAMUSCULAR | Status: AC
  Administered 2019-05-17: 10:00:00 0.5 mL via INTRAMUSCULAR
  Filled 2019-05-15: qty 0.5

## 2019-05-15 MED ORDER — SODIUM CHLORIDE 0.9 % IV SOLN
INTRAVENOUS | Status: AC
  Administered 2019-05-15: 03:00:00 via INTRAVENOUS

## 2019-05-15 MED ORDER — SODIUM CHLORIDE 0.9 % IV SOLN
INTRAVENOUS | Status: DC | PRN
  Administered 2019-05-14: via INTRAVENOUS

## 2019-05-15 MED ORDER — ORAL CARE MOUTH RINSE
15.0000 mL | OROMUCOSAL | Status: DC
  Administered 2019-05-15 – 2019-05-16 (×15): 15 mL via OROMUCOSAL

## 2019-05-15 MED ORDER — CEFAZOLIN SODIUM-DEXTROSE 2-3 GM-%(50ML) IV SOLR
INTRAVENOUS | Status: DC | PRN
  Administered 2019-05-15: 2 g via INTRAVENOUS

## 2019-05-15 MED ORDER — MIDAZOLAM HCL 2 MG/2ML IJ SOLN
INTRAMUSCULAR | Status: DC | PRN
  Administered 2019-05-15 (×2): 1 mg via INTRAVENOUS

## 2019-05-15 MED ORDER — AMIODARONE HCL IN DEXTROSE 360-4.14 MG/200ML-% IV SOLN
60.0000 mg/h | INTRAVENOUS | Status: AC
  Administered 2019-05-15 (×2): 60 mg/h via INTRAVENOUS

## 2019-05-15 MED ORDER — TRAMADOL HCL 50 MG PO TABS
50.0000 mg | ORAL_TABLET | Freq: Four times a day (QID) | ORAL | Status: DC | PRN
  Administered 2019-05-17 – 2019-05-29 (×8): 50 mg via ORAL
  Filled 2019-05-15 (×8): qty 1

## 2019-05-15 MED ORDER — DEXMEDETOMIDINE HCL IN NACL 400 MCG/100ML IV SOLN
0.1000 ug/kg/h | INTRAVENOUS | Status: DC
  Administered 2019-05-15: 12:00:00 0.4 ug/kg/h via INTRAVENOUS
  Administered 2019-05-15: 05:00:00 0.6 ug/kg/h via INTRAVENOUS
  Filled 2019-05-15 (×3): qty 100

## 2019-05-15 MED ORDER — LIDOCAINE 2% (20 MG/ML) 5 ML SYRINGE
INTRAMUSCULAR | Status: DC | PRN
  Administered 2019-05-15: 40 mg via INTRAVENOUS

## 2019-05-15 MED ORDER — ASPIRIN 81 MG PO CHEW
81.0000 mg | CHEWABLE_TABLET | Freq: Once | ORAL | Status: DC
  Filled 2019-05-15: qty 1

## 2019-05-15 MED ORDER — ALBUMIN HUMAN 5 % IV SOLN
INTRAVENOUS | Status: DC | PRN
  Administered 2019-05-15 (×2): via INTRAVENOUS

## 2019-05-15 MED ORDER — SODIUM CHLORIDE 0.9 % IV SOLN: INTRAVENOUS | Status: DC

## 2019-05-15 MED ORDER — SODIUM CHLORIDE 0.9% FLUSH
3.0000 mL | INTRAVENOUS | Status: DC | PRN
  Administered 2019-05-26: 09:00:00 3 mL via INTRAVENOUS
  Filled 2019-05-15: qty 3

## 2019-05-15 MED ORDER — ATROPINE SULFATE 1 MG/10ML IJ SOSY
PREFILLED_SYRINGE | INTRAMUSCULAR | Status: AC
  Filled 2019-05-15: qty 10

## 2019-05-15 MED ORDER — SODIUM CHLORIDE 0.9% FLUSH: 3.0000 mL | INTRAVENOUS | Status: DC | PRN

## 2019-05-15 MED ORDER — INFLUENZA VAC A&B SA ADJ QUAD 0.5 ML IM PRSY
0.5000 mL | PREFILLED_SYRINGE | INTRAMUSCULAR | Status: AC
  Administered 2019-05-17: 18:00:00 0.5 mL via INTRAMUSCULAR
  Filled 2019-05-15 (×3): qty 0.5

## 2019-05-15 MED ORDER — MORPHINE SULFATE (PF) 2 MG/ML IV SOLN
1.0000 mg | INTRAVENOUS | Status: DC | PRN
  Administered 2019-05-15 – 2019-05-24 (×4): 1 mg via INTRAVENOUS
  Filled 2019-05-15 (×4): qty 1

## 2019-05-15 MED ORDER — HEPARIN (PORCINE) 25000 UT/250ML-% IV SOLN
500.0000 [IU]/h | INTRAVENOUS | Status: DC
  Administered 2019-05-15: 02:00:00 500 [IU]/h via INTRAVENOUS
  Filled 2019-05-15: qty 250

## 2019-05-15 MED ORDER — SODIUM CHLORIDE 0.9% FLUSH
3.0000 mL | Freq: Two times a day (BID) | INTRAVENOUS | Status: DC
  Administered 2019-05-15 – 2019-05-21 (×8): 3 mL via INTRAVENOUS

## 2019-05-15 MED ORDER — AMIODARONE HCL IN DEXTROSE 360-4.14 MG/200ML-% IV SOLN
30.0000 mg/h | INTRAVENOUS | Status: DC
  Administered 2019-05-15 – 2019-05-17 (×3): 30 mg/h via INTRAVENOUS
  Filled 2019-05-15 (×7): qty 200

## 2019-05-15 MED ORDER — NITROGLYCERIN IN D5W 200-5 MCG/ML-% IV SOLN
INTRAVENOUS | Status: DC | PRN
  Administered 2019-05-15: 5 ug/min via INTRAVENOUS

## 2019-05-15 MED FILL — Verapamil HCl IV Soln 2.5 MG/ML: INTRAVENOUS | Qty: 2 | Status: AC

## 2019-05-15 MED FILL — Ondansetron HCl Inj 4 MG/2ML (2 MG/ML): INTRAMUSCULAR | Qty: 2 | Status: AC

## 2019-05-15 NOTE — Progress Notes (Signed)
PT Cancellation Note  Patient Details Name: Amy Chambers MRN: [id number] DOB: 1927-10-15   Cancelled Treatment:    Reason Eval/Treat Not Completed: Medical issues which prohibited therapy; intubated s/p emergent procedure overnight.  Will check back another day.   Reginia Naas 05/15/2019, [place] [phone] 05/15/2019

## 2019-05-15 NOTE — Progress Notes (Signed)
      [address]       [place] [address]             [phone]      POD # 1 sternotomy for pericardial hematoma  On CPAP currently  BP (!) 97/48   Pulse 64   Temp (!) 100.4 F (38 C)   Resp (!) 27   Ht 5\' 1"  (1.549 m)   Wt 57.2 kg   SpO2 100%   BMI 23.83 kg/m   Intake/Output Summary (Last 24 hours) at 05/15/2019 1805 Last data filed at 05/15/2019 1700 Gross per 24 hour  Intake 3550.84 ml  Output 1450 ml  Net 2100.84 ml  on levophed at 7 mcg/min Vent per CCM  Tarae Wooden C. Roxan Hockey, MD [facility] [phone]

## 2019-05-15 NOTE — Transfer of Care (Signed)
Immediate Anesthesia Transfer of Care Note  Patient: Amy Chambers  Procedure(s) Performed: Mediastinal Exploration with Washout (N/A Chest)  Patient Location: PACU  Anesthesia Type:General  Level of Consciousness: sedated and Patient remains intubated per anesthesia plan  Airway & Oxygen Therapy: Patient remains intubated per anesthesia plan and Patient placed on Ventilator (see vital sign flow sheet for setting)  Post-op Assessment: Report given to RN and Post -op Vital signs reviewed and stable  Post vital signs: Reviewed and stable  Last Vitals:  Vitals Value Taken Time  BP    Temp 34.1 C 05/15/19 0152  Pulse    Resp 12 05/15/19 0152  SpO2    Vitals shown include unvalidated device data.  Last Pain:  Vitals:   05/14/19 2343  TempSrc:   PainSc: 2       Patients Stated Pain Goal: 2 (23/76/28 3151)  Complications: No apparent anesthesia complications

## 2019-05-15 NOTE — Op Note (Signed)
      [address]       [place] [address]             [phone]                                          05/15/2019 Patient:  Amy Chambers Pre-Op Dx: hemopericardium   Post-op Dx: Same Procedure: Sternotomy Mediastinal washout  Surgeon and Role:      * Nikolai Wilczak, Lucile Crater, MD - Primary    Josie Saunders, PAC - assisting   EBL:  Minimal Blood Administration: None   Drains: 19 F blake drainX2 , mediastinal  Wires: None Counts: correct   Indications: 85 year old female status post PCI to the right coronary artery who developed hemopericardium and evidence of tamponade.  Due to concern for coronary dissection after she underwent another left heart cath which showed no extravasation she was brought to the operating theater for mediastinal washout and exploration with possible CABG x1 to the right coronary artery.  Findings: 250 mls of old blood and clots were evacuated from the pericardium.  There was a perforation noted along the posterior wall in the distribution of the posterior descending artery.  There was no active bleeding coming from this perforation  Operative Technique: All invasive lines were placed in pre-op holding.  After the risks, benefits and alternatives were thoroughly discussed, the patient was brought to the operative theatre.  Anesthesia was induced, and the patient was prepped and draped in normal sterile fashion.  An appropriate surgical pause was performed, and pre-operative antibiotics were dosed accordingly.  We began with an incision  the chest for the sternotomy.  In regards to the sternotomy, this was carried down with bovie cautery, and the sternum was divided with a reciprocating saw.  Meticulous hemostasis was obtained.     The sternal retractor was placed.  The pericardium was divided in the midline.  There was release of old blood and her blood pressure improved significantly.  There was clot along the posterior wall.  With  further inspection there was noted to be a small tear in the epicardium in the distribution of the posterior descending artery.  There is no active bleeding coming from that site but this likely was the source of her hemopericardium.  The pericardium was then copiously irrigated and meticulous hemostasis was obtained.    Chest tubes and wires were placed, and the sternum was re-approximated with with sternal wires.  The soft tissue and skin were re-approximated wth absorbable suture.    The patient tolerated the procedure without any immediate complications, and was transferred to the ICU in guarded condition.  Kassem Kibbe Bary Leriche

## 2019-05-15 NOTE — Procedures (Signed)
Central Venous Catheter Insertion Procedure Note  Line type:  Left intrajugular central catheter  Indications:  Medication administration  Procedure Details:  Informed consent was obtained after explanation of the risks and benefits of the procedure, refer to the consent documentation.  Time-out was performed immediately prior to the procedure.  The left internal jugular vein was identified using bedside ultrasound. This area was prepped and draped in the usual sterile fashion. Maximum sterile technique was used including antiseptics, cap, gloves, gown, hand hygiene, mask, and sterile sheet.  The patient was placed in Trendelenburgs position. Local anesthesia with 1% lidocaine was applied subcutaneously then deep to the skin. The needle was then inserted into the internal jugular vein using ultrasound guidance.  Using the Seldinger Technique a triple lumen catheter was placed with each port easily flushed and freely drawing venous blood.  The catheter was secured with sutures. A sterile bandage with antimicrobial patch was placed over the site.  Condition: The patient tolerated the procedure well and remains in the same condition as pre-procedure.  Complications: None; patient tolerated the procedure well.  Plan: CXR was ordered to verify placement. No pneumothorax visible up on CXR. Central line placement appeared adequate.

## 2019-05-15 NOTE — Progress Notes (Addendum)
Cardiology Progress Note  Patient ID: Amy Chambers MRN: [id number] DOB: 1927/09/10 Date of Encounter: 05/15/2019  Primary Cardiologist: No primary care provider on file.  Subjective  Found to have pericardial tamponade secondary to perforated posterior descending artery.  Taken emergently to the operating room last night, with evacuation of pericardial space.  She remains on pressors this morning.  Chest tube with 100 cc output overnight.  She was intubated on the ventilator.  She developed atrial fibrillation overnight and was placed on amiodarone drip, she has converted back to normal sinus rhythm.  There was some concerns for ST elevations intraoperatively, and she was started on heparin drip.  This has resolved and we have stopped heparin drip.  ROS:  All other ROS reviewed and negative. Pertinent positives noted in the HPI.     Inpatient Medications  Scheduled Meds:  aspirin  81 mg Oral Once   atorvastatin  40 mg Oral q1800   chlorhexidine gluconate (MEDLINE KIT)  15 mL Mouth Rinse BID   Chlorhexidine Gluconate Cloth  6 each Topical Daily   guaiFENesin  600 mg Oral BID   levothyroxine  75 mcg Oral Daily   loratadine  10 mg Oral Daily   mouth rinse  15 mL Mouth Rinse 10 times per day   mometasone-formoterol  2 puff Inhalation BID   pantoprazole  40 mg Oral Q0600   polyethylene glycol  17 g Oral Daily   sodium chloride flush  10-40 mL Intracatheter Q12H   sodium chloride flush  3 mL Intravenous Q12H   sodium chloride flush  3 mL Intravenous Q12H   sodium chloride flush  3 mL Intravenous Q12H   ticagrelor  90 mg Oral BID   Continuous Infusions:  sodium chloride Stopped (05/15/19 0600)   amiodarone 60 mg/hr (05/15/19 0700)   amiodarone     cefTRIAXone (ROCEPHIN)  IV Stopped (05/13/19 1711)   dexmedetomidine (PRECEDEX) IV infusion 0.6 mcg/kg/hr (05/15/19 0700)   heparin Stopped (05/15/19 0353)   norepinephrine (LEVOPHED) Adult infusion 4 mcg/min (05/15/19  0700)   PRN Meds: sodium chloride, acetaminophen, albuterol, morphine injection, ondansetron **OR** ondansetron (ZOFRAN) IV, sodium chloride flush, sodium chloride flush, traMADol, traZODone   Vital Signs   Vitals:   05/15/19 0500 05/15/19 0530 05/15/19 0600 05/15/19 0630  BP: (!) 92/52     Pulse: 61 63 62 (!) 58  Resp: 12 12 16 17   Temp: (!) 95 F (35 C) (!) 95.5 F (35.3 C) (!) 96.1 F (35.6 C) (!) 96.8 F (36 C)  TempSrc:      SpO2: 100% 100% 100% 100%  Weight:      Height:        Intake/Output Summary (Last 24 hours) at 05/15/2019 0754 Last data filed at 05/15/2019 0700 Gross per 24 hour  Intake 2910.09 ml  Output 1390 ml  Net 1520.09 ml   Last 3 Weights 05/14/2019 05/13/2019 05/12/2019  Weight (lbs) 126 lb 1.7 oz 118 lb 6.4 oz 117 lb 14.4 oz  Weight (kg) 57.2 kg 53.706 kg 53.479 kg      Telemetry  Overnight telemetry shows atrial fibrillation that is now resolved and she is in normal sinus rhythm with heart rate in the 50s, which I personally reviewed.   ECG  The most recent ECG shows atrial fibrillation with heart rate 104, nonspecific ST-T changes noted, which I personally reviewed.   Physical Exam   Vitals:   05/15/19 0500 05/15/19 0530 05/15/19 0600 05/15/19 0630  BP: (!) 92/52  Pulse: 61 63 62 (!) 58  Resp: 12 12 16 17   Temp: (!) 95 F (35 C) (!) 95.5 F (35.3 C) (!) 96.1 F (35.6 C) (!) 96.8 F (36 C)  TempSrc:      SpO2: 100% 100% 100% 100%  Weight:      Height:         Intake/Output Summary (Last 24 hours) at 05/15/2019 0754 Last data filed at 05/15/2019 0700 Gross per 24 hour  Intake 2910.09 ml  Output 1390 ml  Net 1520.09 ml    Last 3 Weights 05/14/2019 05/13/2019 05/12/2019  Weight (lbs) 126 lb 1.7 oz 118 lb 6.4 oz 117 lb 14.4 oz  Weight (kg) 57.2 kg 53.706 kg 53.479 kg    Body mass index is 23.83 kg/m.  General: Intubated, sedated on vent Head: Atraumatic, normal size  Eyes: PEERLA, EOMI  Neck: Supple, no JVD Endocrine: No  thryomegaly Cardiac: S1, S2, pericardial rub noted Lungs: Diminished breath sounds at the bases Abd: Soft, nontender, no hepatomegaly  Ext: No edema, pulses 2+ Musculoskeletal: No deformitiesl Skin: Sternotomy incision noted midline, clean and dry Neuro: Intubated sedated on vent  Labs  High Sensitivity Troponin:   Recent Labs  Lab 05/10/19 1303 05/10/19 1527 05/10/19 1906 05/12/19 0653 05/12/19 0908  TROPONINIHS 44* 499* 975* 1,563* 1,288*     Cardiac EnzymesNo results for input(s): TROPONINI in the last 168 hours. No results for input(s): TROPIPOC in the last 168 hours.  Chemistry Recent Labs  Lab 05/10/19 1303  05/14/19 0328 05/14/19 1758  05/15/19 0026  05/15/19 0202 05/15/19 0220 05/15/19 0643  NA 138   < > 139 135   < > 141   < > 145 145 140  K 4.3   < > 4.2 4.5   < > 4.8   < > 3.5 3.3* 4.5  CL 104   < > 105 106   < > 109  --   --  108 112*  CO2 23   < > 27 21*  --   --   --   --   --  20*  GLUCOSE 195*   < > 152* 275*   < > 163*  --   --  142* 175*  BUN 27*   < > 37* 28*   < > 29*  --   --  25* 28*  CREATININE 0.78   < > 0.70 0.66   < > 0.50  --   --  0.60 0.89  CALCIUM 8.6*   < > 8.2* 7.7*  --   --   --   --   --  6.5*  PROT 7.3  --   --   --   --   --   --   --   --   --   ALBUMIN 4.0  --   --   --   --   --   --   --   --   --   AST 38  --   --   --   --   --   --   --   --   --   ALT 32  --   --   --   --   --   --   --   --   --   ALKPHOS 65  --   --   --   --   --   --   --   --   --  BILITOT 0.5  --   --   --   --   --   --   --   --   --   GFRNONAA >60   < > >60 >60  --   --   --   --   --  57*  GFRAA >60   < > >60 >60  --   --   --   --   --  >60  ANIONGAP 11   < > 7 8  --   --   --   --   --  8   < > = values in this interval not displayed.    Hematology Recent Labs  Lab 05/14/19 1758  05/15/19 0200 05/15/19 0202 05/15/19 0220 05/15/19 0643  WBC 12.2*  --  18.7*  --   --  20.1*  RBC 3.42*  --  2.98*  --   --  4.17  HGB 10.5*   < > 8.7*  8.2* 8.2* 12.2  HCT 32.3*   < > 28.1* 24.0* 24.0* 37.6  MCV 94.4  --  94.3  --   --  90.2  MCH 30.7  --  29.2  --   --  29.3  MCHC 32.5  --  31.0  --   --  32.4  RDW 13.5  --  16.1*  --   --  16.6*  PLT 306  --  52*  --   --  81*   < > = values in this interval not displayed.   BNP Recent Labs  Lab 05/10/19 1303 05/11/19 1437  BNP 211.0* 1,643.2*    DDimer No results for input(s): DDIMER in the last 168 hours.   Radiology  Ct Abdomen Pelvis Wo Contrast  Result Date: 05/14/2019 CLINICAL DATA:  85 year old female with back pain and hypertension. Status post catheterization. Rule out retroperitoneal hematoma. EXAM: CT ABDOMEN AND PELVIS WITHOUT CONTRAST TECHNIQUE: Multidetector CT imaging of the abdomen and pelvis was performed following the standard protocol without IV contrast. COMPARISON:  Chest CT dated 05/10/2019 FINDINGS: Evaluation of this exam is limited in the absence of intravenous contrast. Lower chest: Mild emphysematous changes of the visualized lung bases. Several punctate calcified granuloma at the left lung base. Probable trace right pleural effusion. Partially visualized high attenuating pericardial effusion measuring up to 8 mm in thickness most consistent with hemopericardium. This is new since the prior CT. Clinical correlation is recommended. There is coronary vascular calcification. No intra-abdominal free air or free fluid. No intraperitoneal or retroperitoneal hematoma. Hepatobiliary: Several hepatic hypodense lesions which are not completely characterized this non-contrast CT and measure up to approximately 3 cm. These demonstrate fluid attenuation most consistent with cysts. High attenuating content within the gallbladder likely represents vicarious excretion of injected contrast from recent catheterization. Small gallstones are not excluded. No pericholecystic fluid. Pancreas: Unremarkable. No pancreatic ductal dilatation or surrounding inflammatory changes. Spleen:  Normal in size without focal abnormality. Adrenals/Urinary Tract: The adrenal glands are unremarkable. Enhancing renal parenchyma secondary to recent contrast administration. Multiple bilateral renal cysts measure up to 3.3 cm on the left. Additional smaller hypodensities are too small to characterize. There is no hydronephrosis on either side. Excreted contrast noted the partially distended urinary bladder. Stomach/Bowel: There is sigmoid diverticulosis without active inflammatory changes. There is no bowel obstruction or active inflammation. The appendix is normal. There is a 4 cm duodenal diverticula. Vascular/Lymphatic: Advanced aortoiliac atherosclerotic disease. Left femoral venous line with tip in the left  common femoral vein. Right common iliac artery catheterization with tip in the right external iliac artery. No hematoma. The IVC is grossly unremarkable. Small pocket of air in the upper IVC, likely introduced via catheterization. No portal venous gas. There is no adenopathy. Reproductive: The uterus and ovaries are grossly unremarkable. Other: None Musculoskeletal: Osteopenia with degenerative changes of the spine. No acute osseous pathology. IMPRESSION: 1. No acute intra-abdominal or pelvic pathology. No intraperitoneal or retroperitoneal hematoma. 2. Partially visualized hemopericardium. Clinical correlation is recommended. 3. Small pocket of air within the upper IVC, iatrogenic. 4. Sigmoid diverticulosis. 5. Advanced aortic Atherosclerosis (ICD10-I70.0). These results were called by telephone at the time of interpretation on 05/14/2019 at 7:52 pm to provider O'Neal, who verbally acknowledged these results. Electronically Signed   By: Anner Crete M.D.   On: 05/14/2019 19:56   Ct Angio Chest/abd/pel For Dissection W And/or W/wo  Result Date: 05/14/2019 CLINICAL DATA:  85 year old female status post cardiac catheterization. Concern for dissection. EXAM: CT ANGIOGRAPHY CHEST, ABDOMEN AND PELVIS  TECHNIQUE: Multidetector CT imaging through the chest, abdomen and pelvis was performed using the standard protocol during bolus administration of intravenous contrast. Multiplanar reconstructed images and MIPs were obtained and reviewed to evaluate the vascular anatomy. CONTRAST:  197m OMNIPAQUE IOHEXOL 350 MG/ML SOLN COMPARISON:  Earlier CT of the abdomen pelvis dated 05/14/2019 FINDINGS: CTA CHEST FINDINGS Cardiovascular: There is no cardiomegaly. High attenuating pericardial effusion measures up to 11 mm in thickness. This is most consistent with hemopericardium. There is multi vessel coronary vascular calcification. Evaluation of the heart is limited due to cardiac motion. There is moderate calcified and noncalcified plaque of the thoracic aorta. No aneurysmal dilatation or dissection. The visualized origins of the great vessels of the aortic arch appear patent. There is a diminutive left vertebral artery. The central pulmonary arteries appear patent. Mediastinum/Nodes: No hilar or mediastinal adenopathy. The esophagus is grossly unremarkable. No mediastinal fluid collection. Lungs/Pleura: There is emphysematous changes of the lungs. Trace bilateral pleural effusions. No focal consolidation or pneumothorax. The central airways are patent. Musculoskeletal: Degenerative changes of the spine. No acute osseous pathology. Review of the MIP images confirms the above findings. CTA ABDOMEN AND PELVIS FINDINGS VASCULAR Aorta: Advanced atherosclerotic calcification. No aneurysmal dilatation or dissection. Celiac: Atherosclerotic calcification and narrowing of the origin of the celiac axis. The celiac axis remains patent. SMA: Atherosclerotic calcification and narrowing of the origin of the SMA. The SMA is patent. Renals: Atherosclerotic calcification of the origins of the renal arteries. The renal arteries remain patent although appear narrowed. Accessory arteries noted to the lower pole of the kidneys bilaterally. IMA:  The IMA is patent. Inflow: Advanced atherosclerotic calcification of the iliac arteries. There is irregularity of the common iliac arteries with focal ectasia of the right common iliac artery measuring up to 13 mm. The iliac arteries remain patent. Right femoral artery catheter with tip in the right external iliac artery noted. Veins: No obvious venous abnormality within the limitations of this arterial phase study. A small pocket of air in the upper IVC, iatrogenic. Left common femoral vein catheter with tip left common iliac vein. No fluid collection or hematoma. Review of the MIP images confirms the above findings. NON-VASCULAR Hepatobiliary: Multiple hepatic hypodense lesions, likely cysts. Mild irregularity of the liver contour likely cirrhosis. There appears to be partial cannulization of the paraumbilical vein indicative of portal hypertension. No intrahepatic biliary ductal dilatation. Excreted contrast within the gallbladder. Pancreas: Unremarkable. No pancreatic ductal dilatation or surrounding inflammatory changes. Spleen:  Normal in size without focal abnormality. Adrenals/Urinary Tract: The adrenal glands are unremarkable. Bilateral renal cysts measuring up to 4 cm and smaller hypodense lesions which are not characterized. Hyperenhancement of the renal parenchyma concerning for a degree of renal dysfunction. Clinical correlation is recommended. No hydronephrosis. The urinary bladder is filled with excreted contrast. Stomach/Bowel: There is a 4 cm duodenal diverticulum. There is sigmoid diverticulosis without active inflammatory changes. A rectal tube is in place. There is no bowel obstruction. The appendix is normal. Lymphatic: No adenopathy. Reproductive: The uterus and ovaries are grossly unremarkable. Other: No intra-abdominal fluid or free air. No hematoma. Musculoskeletal: Osteopenia with degenerative changes of the spine. No acute osseous pathology. Review of the MIP images confirms the above  findings. IMPRESSION: 1. No CT evidence of aortic dissection or aneurysm.  No hematoma. 2. High attenuating pericardial effusion most consistent with hemopericardium. 3. Coronary vascular calcification. 4. Colonic diverticulosis. No bowel obstruction or active inflammation. Normal appendix. 5. Hyperenhancement of the renal parenchyma concerning for a degree of renal dysfunction. Clinical correlation is recommended. 6. Aortic Atherosclerosis (ICD10-I70.0) and Emphysema (ICD10-J43.9). Electronically Signed   By: Anner Crete M.D.   On: 05/14/2019 21:03    Cardiac Studies  TTE 9/29  1. Left ventricular ejection fraction, by visual estimation, is 55%. The left ventricle has normal function. Normal left ventricular size. There is mildly increased left ventricular hypertrophy. The left ventricle was not fully evaluated, unable to  comment on wall motion pattern.  2. Left ventricular diastolic function could not be evaluated pattern of LV diastolic filling.  3. Global right ventricle has normal systolic function.The right ventricular size is normal. No increase in right ventricular wall thickness.  4. Left atrial size was normal.  5. Right atrial size was normal.  6. Mild mitral annular calcification.  7. The mitral valve is normal in structure. No evidence of mitral valve regurgitation. No evidence of mitral stenosis.  8. The tricuspid valve is not assessed. Tricuspid valve regurgitation was not assessed by color flow Doppler.  9. The aortic valve was not assessed Aortic valve regurgitation was not assessed by color flow Doppler. Mild aortic valve sclerosis without stenosis. 10. The pulmonic valve was not assessed. Pulmonic valve regurgitation was not assessed by color flow Doppler. 11. The interatrial septum was not assessed. 12. Moderate pericardial effusion with extensive fibrinous material suggestive of hematoma. There was > 25% respirophasic variation of mitral E inflow velocity. The IVC measured  1.8 cm but collapsed < 50%. There was RV diastolic indentation noted.  Findings suggestive of tamponade.  Patient Profile  EMILLIA WEATHERLY is a 85 y.o. female with YAZLEEMAR STRASSNER is a 85 y.o. female with history of COPD, hypertension, GERD who was admitted as a direct transfer on 9/25 for acute hypoxic respiratory failure secondary to COPD exacerbation versus pneumonia.  She was subsequently found to have elevated cardiac enzymes concerning for non-STEMI.    She was taken to the cardiac catheterization laboratory on 9/29 and had intervention to her proximal right coronary artery.  Course has been complicated by coronary artery perforation, cardiac tamponade/hemopericardium.  She was taken to the operating room on 9/29 for pericardial washout.  Assessment & Plan   1.  Cardiogenic shock secondary to pericardial tamponade -Cardiac cath on 9/29 with PCI to the proximal right RCA.  Postprocedure she developed acute hypotension with abdominal pain.  There was concerns for a retroperitoneal bleed.  CT abdomen pelvis did not demonstrate RP bleed, but did demonstrate hemopericardium.  CT chest dissection protocol did not show an acute aortic dissection.  She was taken back to the cardiac catheterization laboratory and repeat coronary angiogram did not demonstrate any active coronary perforation.  Echocardiogram overnight demonstrated acute pericardial thrombus with cardiac tamponade, setting her clinical picture.  She was taken to the operating room for pericardial washout. -She remains on levo fed this morning.  We will continue this with a map goal of 60 -We will plan to put a central line in her internal jugular vein and remove the left femoral central line.  I do not want to keep a femoral line in her as there is a high risk of infection with this location.  Critical care medicine colleagues will assist [place] with this. -Her sheath remains in her right femoral artery, and we will have a radial arterial line placed  today.  We will then remove her sheath.  We will check an ACT as she was briefly on heparin overnight and as long as this is less than 180 we will proceed with pulling the sheath. -She has received a total of 3 units of blood and hemoglobin is stable at 12.2 this morning -We will attempt extubation today, she was only intubated for procedure  2.  Acute systolic heart failure, ejection fraction 45 to 50% with wall motion abnormalities in the RCA distribution -She was admitted with likely mixed non-STEMI as well as acute systolic heart failure -She was effectively diuresed, but she has been complicated by the above course -For now we will hold on any guideline directed medical therapy and initiate as we can when she is out of the ICU and more stable  3.  Acute blood loss anemia in the setting of hemopericardium -Hemoglobin is stable at 12.2 this morning we will transfuse as needed  4.  Thrombocytopenia -Platelets this morning 81 but down to 52 overnight.  Likely related to recent surgery.  I do not suspect heparin-induced number cytopenia due to acute drop.  We will continue to monitor for now.  5.  Atrial fibrillation with rapid ventricular response -She developed A. fib after her procedure.  She has since converted on IV amiodarone therapy to normal sinus rhythm. -This is all in the setting of acute illness and thoracic surgery -We will continue IV amiodarone and plan for a short course of this postoperatively -We will not pursue any heparin therapy or anticoagulation given bleeding risk and recent sternotomy  6.  Non-STEMI status post PCI to proximal RCA -She was admitted to transfer from an outside hospital with elevated troponin (high-sensitivity around 1500) -Echocardiogram demonstrated wall motion abnormalities in the RCA territory with mildly reduced ejection fraction 45 to 50%. -She is status post PCI to the proximal RCA yesterday -Per CT surgery, we are okay to rechallenge her with  her aspirin Brilinta and will do so today.  The drains were maintained as we see how she tolerates this. -We will continue her on high intensity statin  7.  Hypertension -Hold all home antihypertensives in setting of acute loss anemia, cardiogenic shock, likely vasogenic shock related to recent procedure  8.  COPD exacerbation versus pneumonia -Was originally admitted for this purpose -She has completed 5 days of IV steroid therapy and we will stop this -We will complete a 5-day course of antibiotics and then stop this -We will initiate DuoNebs in the next day or 2  9.  Hypothyroidism -Home Synthroid  FEN -Maintenance fluids 75 cc/h -N.p.o. on vent.  Will hold on  tube feeds expect she can be extubated today. -Lines: Right femoral artery sheath in place, left femoral central venous catheter -Code: Full -DVT: SCDs   For questions or updates, please contact [place] Please consult [email or web address] for contact info under   CRITICAL CARE Performed by: [name]  Total critical care time: 60 minutes. Critical care time was exclusive of separately billable procedures and treating other patients. Critical care was necessary to treat or prevent imminent or life-threatening deterioration. Critical care was time spent personally by me on the following activities: development of treatment plan with patient and/or surrogate as well as nursing, discussions with consultants, evaluation of patient's response to treatment, examination of patient, obtaining history from patient or surrogate, ordering and performing treatments and interventions, ordering and review of laboratory studies, ordering and review of radiographic studies, pulse oximetry and re-evaluation of patient's condition.  Signed, Addison Naegeli. Audie Box, [place]  05/15/2019 8:18 AM

## 2019-05-15 NOTE — Procedures (Signed)
Arterial Catheter Insertion Procedure Note Amy Chambers [id number] 03-16-1928  Procedure: Insertion of Arterial Catheter  Indications: Blood pressure monitoring  Procedure Details Consent: Unable to obtain consent because of emergent medical necessity. Time Out: Verified patient identification, verified procedure, site/side was marked, verified correct patient position, special equipment/implants available, medications/allergies/relevent history reviewed, required imaging and test results available.  Performed  Maximum sterile technique was used including antiseptics, cap, gloves, gown, hand hygiene, mask and sheet. Skin prep: Chlorhexidine; local anesthetic administered 20 gauge catheter was inserted into left radial artery using the Seldinger technique. ULTRASOUND GUIDANCE USED: NO Evaluation Blood flow good; BP tracing good. Complications: No apparent complications.   Amy Chambers 05/15/2019

## 2019-05-15 NOTE — Progress Notes (Signed)
      [address]       [place] [address]             [phone]                 1 Day Post-Op Procedure(s) (LRB): Mediastinal Exploration with Washout (N/A)   Events: afib overnight _______________________________________________________________ Vitals: BP (!) 98/44   Pulse 60   Temp 98.4 F (36.9 C)   Resp (!) 22   Ht 5\' 1"  (1.549 m)   Wt 57.2 kg   SpO2 100%   BMI 23.83 kg/m   - Neuro: arousable, follows command  - Cardiovascular: sinus  Drips:  Levo 4, amio and hep gtt    - Pulm: stable on vent Vent Mode: SIMV;PRVC FiO2 (%):  [50 %-100 %] 50 % Set Rate:  [12 bmp] 12 bmp Vt Set:  [470 mL] 470 mL PEEP:  [5 cmH20] 5 cmH20 Pressure Support:  [10 cmH20] 10 cmH20 Plateau Pressure:  [21 cmH20] 21 cmH20  ABG    Component Value Date/Time   PHART 7.380 05/15/2019 0202   PCO2ART 37.9 05/15/2019 0202   PO2ART 240.0 (H) 05/15/2019 0202   HCO3 23.2 05/15/2019 0202   TCO2 22 05/15/2019 0220   ACIDBASEDEF 3.0 (H) 05/15/2019 0202   O2SAT 100.0 05/15/2019 0202    - Abd: soft/ND - Extremity: warm  .Intake/Output      09/29 0701 - 09/30 0700 09/30 0701 - 10/01 0700   P.O.     I.V. (mL/kg) 2157.2 (37.7)    IV Piggyback 752.9    Total Intake(mL/kg) 2910.1 (50.9)    Urine (mL/kg/hr) 1190 (0.9)    Stool 0    Blood 100    Chest Tube 100    Total Output 1390    Net +1520.1         Urine Occurrence 1 x    Stool Occurrence 1 x       _______________________________________________________________ Labs: CBC Latest Ref Rng & Units 05/15/2019 05/15/2019 05/15/2019  WBC 4.0 - 10.5 K/uL 20.1(H) - -  Hemoglobin 12.0 - 15.0 g/dL 12.2 8.2(L) 8.2(L)  Hematocrit 36.0 - 46.0 % 37.6 24.0(L) 24.0(L)  Platelets 150 - 400 K/uL 81(L) - -   CMP Latest Ref Rng & Units 05/15/2019 05/15/2019 05/15/2019  Glucose 70 - 99 mg/dL 175(H) 142(H) -  BUN 8 - 23 mg/dL 28(H) 25(H) -  Creatinine 0.44 - 1.00 mg/dL 0.89 0.60 -  Sodium 135 - 145 mmol/L 140 145 145  Potassium  3.5 - 5.1 mmol/L 4.5 3.3(L) 3.5  Chloride 98 - 111 mmol/L 112(H) 108 -  CO2 22 - 32 mmol/L 20(L) - -  Calcium 8.9 - 10.3 mg/dL 6.5(L) - -  Total Protein 6.5 - 8.1 g/dL - - -  Total Bilirubin 0.3 - 1.2 mg/dL - - -  Alkaline Phos 38 - 126 U/L - - -  AST 15 - 41 U/L - - -  ALT 0 - 44 U/L - - -    CXR: clear  _______________________________________________________________  Assessment and Plan: POD 0 s/p sternotomy, and evacuation of pericardial effusion  Neuro: wean sedation CV: wean levo.  Ok to start anticoagulation since chest tubes are in Pulm: wean to extubate Renal: creat stable GI: npo for now Heme: hgb stable.   ID: afebrile, WBC of 20.  Will follow Dispo: continue ICU care per primary  Melodie Bouillon, MD 05/15/2019 8:12 AM

## 2019-05-15 NOTE — Brief Op Note (Signed)
05/10/2019 - 05/15/2019  12:51 AM  PATIENT:  Marcy Panning Kings  85 y.o. female  PRE-OPERATIVE DIAGNOSIS:  1. S/p NSTEMI 2. S/p PCI with Synergy DES stents x 2 to RCA 3.Hemopericardium  POST-OPERATIVE DIAGNOSIS:  1. S/p NSTEMI 2. S/p PCI with Synergy DES stents x 2 to RCA 3.Hemopericardium  PROCEDURE:  EMERGENT MEDIAN STERNOTOMY FINDINGS: Clot found along right side of heart   SURGEON:  Surgeon(s) and Role:    Lightfoot, Lucile Crater, MD - Primary  PHYSICIAN ASSISTANT: Lars Pinks PA-C  ASSISTANTS: Alcide Evener RNFA  ANESTHESIA:   general  EBL:  100 ml  DRAINS: (Two) Blake drain(s) in the mediastinal and pleural spaces   COUNTS CORRECT:  YES  DICTATION: .Dragon Dictation  PLAN OF CARE: Admit to inpatient   PATIENT DISPOSITION:  ICU - intubated and hemodynamically stable.   Delay start of Pharmacological VTE agent (>24hrs) due to surgical blood loss or risk of bleeding: yes

## 2019-05-15 NOTE — Progress Notes (Signed)
Order given to not extubate Per MD Lightfoot Order given to keep sheath Per MD Ellyn Hack MD Aundra Dubin notified of a-fib amio infusion started. MD is aware of platelet count. Will recheck in the AM.

## 2019-05-15 NOTE — Progress Notes (Signed)
Initial Nutrition Assessment  DOCUMENTATION CODES:   Not applicable  INTERVENTION:   If unable to extubate, recommend intiation of tube feeds: - Vital AF 1.2 @ 45 ml/hr (1080 ml/day)  Tube feeding regimen provides 1296 kcal, 81 grams of protein, and 876 ml of H2O.   NUTRITION DIAGNOSIS:   Inadequate oral intake related to inability to eat as evidenced by NPO status.  GOAL:   Patient will meet greater than or equal to 90% of their needs  MONITOR:   Vent status, Labs, Weight trends, Skin, I & O's  REASON FOR ASSESSMENT:   Ventilator    ASSESSMENT:   85 year old female who presented to the ED on 9/25 with SOB. PMH of COPD, HTN, HLD, GERD. Pt admitted with NSTEMI.   9/29 - left heart cath, CT scan showing new pericardial effusion, s/p pericardial window  Discussed pt with RN and during ICU rounds. Pt remains intubated post-op.  Unsure of EDW as weight on admission was 126 lbs but lowest weight since admit was 116 lbs. Will monitor trends.  Patient is currently intubated on ventilator support MV: 6.6 L/min Temp (24hrs), Avg:96.7 F (35.9 C), Min:92.8 F (33.8 C), Max:100.6 F (38.1 C) BP (a-line): 98/49 MAP (a-line): 66   Drips: Precedex: 5.7 ml/hr Amiodarone: 16.7 ml/hr Levophed: 30 ml/hr  Meal Completion: 500-100% prior to intubation  Medications reviewed and include: Protonix, Miralax, IV abx  Labs reviewed.  UOP: 1190 ml x 24 hours CT: 100 ml x 24 hours I/O's: +2.9 L since admit  NUTRITION - FOCUSED PHYSICAL EXAM:  Unable to complete at this time. Pt undergoing bedside procedure at time of RD visit.  Diet Order:   Diet Order            Diet NPO time specified  Diet effective now        Diet NPO time specified Except for: Ice Chips, Other (See Comments)  Diet effective now              EDUCATION NEEDS:   No education needs have been identified at this time  Skin:  Skin Assessment: Skin Integrity Issues: Skin Integrity  Issues: Incisions: closed incision to chest  Last BM:  05/14/19  Height:   Ht Readings from Last 1 Encounters:  05/11/19 5\' 1"  (1.549 m)    Weight:   Wt Readings from Last 1 Encounters:  05/14/19 57.2 kg    Ideal Body Weight:  47.7 kg  BMI:  Body mass index is 23.83 kg/m.  Estimated Nutritional Needs:   Kcal:  1258  Protein:  70-85 grams  Fluid:  >/= 1.3 L    Gaynell Face, MS, RD, LDN Inpatient Clinical Dietitian Pager: [phone] Weekend/After Hours: [phone]

## 2019-05-15 NOTE — Anesthesia Procedure Notes (Signed)
Procedure Name: Intubation Date/Time: 05/15/2019 12:04 AM Performed by: Claris Che, CRNA Pre-anesthesia Checklist: Patient identified, Emergency Drugs available, Suction available and Patient being monitored Patient Re-evaluated:Patient Re-evaluated prior to induction Oxygen Delivery Method: Circle System Utilized Preoxygenation: Pre-oxygenation with 100% oxygen Induction Type: IV induction, Rapid sequence and Cricoid Pressure applied Laryngoscope Size: Mac and 3 Grade View: Grade III Tube type: Oral Tube size: 8.0 mm Number of attempts: 1 Airway Equipment and Method: Stylet Placement Confirmation: ETT inserted through vocal cords under direct vision,  positive ETCO2 and breath sounds checked- equal and bilateral Secured at: 22 cm Tube secured with: Tape Dental Injury: Teeth and Oropharynx as per pre-operative assessment

## 2019-05-15 NOTE — Consult Note (Signed)
NAME:  Amy Chambers, MRN:  [id number], DOB:  11-12-1927, LOS: 5 ADMISSION DATE:  05/10/2019, CONSULTATION DATE:  05/15/19 REFERRING MD: Cliffton Asters , CHIEF COMPLAINT:  Cardiac tamponade  Brief History   85 yo female admitted for acute respiratory failure and was found to have NSTEMI. PCI performed and was found to have 80% stenosis of mid RCA with DES placement. Acutely decompensated after returning to floor and was found to have cardiac tamponade requiring mediastinal washout with evacuated from pericardial space.  History of present illness   85 yo female who was transferred from [facility] long hospital for acute hypoxic respiratory failure. Pt was found to have NSTEMI and was taken to cath lab where 2 DES were placed to RCA. Patient developed acute decompensation where she was found to have hemopericardium. She was taken to OR by CT surg for mediastinal washout at which time was evacuated from the pericardial space.   Past Medical History  HTN CAD Hypothyroidism Hyperlipidemia  Consults:  PCCM Cardiology CT surgery  Procedures:  9/29 LHC 9/29 mediastinal washout for cardiac tamponade   Significant Diagnostic Tests:  9/29 LHC: 80% stenosis of prox RCA. LAD 20% stenosis. 40% stenosis of prox circumflex.   9/29 ECHO: EF 45-50%. Mild hypokinesis of inferior septum/wall. Impaired diastolic dysfunction. Pericardial effusion to right ventricle.  Micro Data:  9/25 COVID NEG 9/25 Blood cultures no growth  Antimicrobials:  Rocephin 9/26>>  Interim history/subjective:  Taken to OR last night for tamponade evacuation.   Objective   Blood pressure (!) 98/44, pulse 60, temperature 98.4 F (36.9 C), resp. rate (!) 22, height 5\' 1"  (1.549 m), weight 57.2 kg, SpO2 100 %.    Vent Mode: PRVC FiO2 (%):  [50 %-100 %] 50 % Set Rate:  [12 bmp] 12 bmp Vt Set:  [470 mL] 470 mL PEEP:  [5 cmH20] 5 cmH20 Plateau Pressure:  [21 cmH20] 21 cmH20   Intake/Output Summary (Last 24 hours)  at 05/15/2019 0805 Last data filed at 05/15/2019 0700 Gross per 24 hour  Intake 2910.09 ml  Output 1390 ml  Net 1520.09 ml   Filed Weights   05/12/19 0608 05/13/19 0341 05/14/19 0127  Weight: 53.5 kg 53.7 kg 57.2 kg    Examination: General: in no acute distress HENT: ETT in place. No secretions. Lungs: clear to auscultation. On ventilator Cardiovascular: RRR. Extremities warm Abdomen: soft. bs present. Sternotomy with dressing intact Extremities: warm. Neuro: sedation. PERRL. Moving all four extremities. Not following commands.     Assessment & Plan:   NSTEMI. Cardiac tamponade. CAD. Initially presented for acute hypoxic respiratory failure. Elevated troponins-->found to have NSTEMI. LHC revealed 80% stenosis of RCA for which 2 DES's were placed. Post procedure, pt developed hypotension and abd pain. CT chest revealed hemopericardium. Echo revealed acute pericardial thrombus with tamponade. Received mediastinotomy with tamponade evacuation--apprx 05/16/19. was some concern intraopertively regarding ST elevations and was temporarily placed on heparin drip. Was discontinued since that time. Issues with blood pressure overnight and placed on levophed which has been running between 15-44mcg. Left IJ central line 9/30. Plan:  Continue levophed titration to bp. Hold home antihypertensives. Management per primary. Will likely start brilinta and asprin. Continue statin.  Afib with RVR. Developed post-op. Converted back to SR with amio. Management per cardio/CT surg  Acute respiratory failure with hypoxia. Initial complaint. Was started COPD exac regimen. -CXR clear -was intubated prior to procedure. Will try to extubate today. VAP protocol. -continue steroids + abx for a total of  5d course  Anemia. Likely ACD + acute blood loss 2/2 hemopericardium post operatively. Hgb 12.2 this morning s/p 3U PRBCs Thrombocytopenia. Likely post opertive superimposed on chronic.  Plan: will continue to  monitor  Hypothyroidism: continue home synthroid  Best practice:  Diet: NPO Pain/Anxiety/Delirium protocol (if indicated):  VAP protocol  DVT prophylaxis: SCDs Mobility: BR. PT once extubated. Code Status: Full Family Communication: Daughter updated Disposition: ICU for now. Pressor support and ventilatory needs unfit for medical unit  Labs   CBC: Recent Labs  Lab 05/10/19 1303  05/13/19 0853 05/14/19 0328 05/14/19 1758  05/15/19 0058 05/15/19 0200 05/15/19 0202 05/15/19 0220 05/15/19 0643  WBC 22.2*   < > 6.7 6.8 12.2*  --   --  18.7*  --   --  20.1*  NEUTROABS 12.6*  --   --   --   --   --   --   --   --   --   --   HGB 13.9   < > 11.7* 10.9* 10.5*   < > 8.8* 8.7* 8.2* 8.2* 12.2  HCT 46.3*   < > 37.4 35.2* 32.3*   < > 26.0* 28.1* 24.0* 24.0* 37.6  MCV 98.7   < > 95.2 95.9 94.4  --   --  94.3  --   --  90.2  PLT 320   < > 153 137* 306  --   --  52*  --   --  81*   < > = values in this interval not displayed.    Basic Metabolic Panel: Recent Labs  Lab 05/11/19 0458 05/13/19 2536 05/14/19 0328 05/14/19 1758 05/14/19 1809  05/15/19 0026 05/15/19 0058 05/15/19 0202 05/15/19 0220 05/15/19 0643  NA 141 140 139 135 137   < > 141 142 145 145 140  K 4.7 4.2 4.2 4.5 4.5   < > 4.8 3.9 3.5 3.3* 4.5  CL 104 103 105 106 105  --  109  --   --  108 112*  CO2 25 27 27  21*  --   --   --   --   --   --  20*  GLUCOSE 141* 137* 152* 275* 271*  --  163*  --   --  142* 175*  BUN 24* 38* 37* 28* 30*  --  29*  --   --  25* 28*  CREATININE 0.89 0.76 0.70 0.66 0.60  --  0.50  --   --  0.60 0.89  CALCIUM 9.1 8.8* 8.2* 7.7*  --   --   --   --   --   --  6.5*   < > = values in this interval not displayed.   GFR: Estimated Creatinine Clearance: 32.3 mL/min (by C-G formula based on SCr of 0.89 mg/dL). Recent Labs  Lab 05/10/19 1527 05/10/19 1715  05/14/19 0328 05/14/19 1758 05/15/19 0200 05/15/19 0643  WBC  --   --    < > 6.8 12.2* 18.7* 20.1*  LATICACIDVEN 1.7 1.9  --   --    --   --  1.9   < > = values in this interval not displayed.    Liver Function Tests: Recent Labs  Lab 05/10/19 1303  AST 38  ALT 32  ALKPHOS 65  BILITOT 0.5  PROT 7.3  ALBUMIN 4.0   No results for input(s): LIPASE, AMYLASE in the last 168 hours. No results for input(s): AMMONIA in the last 168 hours.  ABG  Component Value Date/Time   PHART 7.380 05/15/2019 0202   PCO2ART 37.9 05/15/2019 0202   PO2ART 240.0 (H) 05/15/2019 0202   HCO3 23.2 05/15/2019 0202   TCO2 22 05/15/2019 0220   ACIDBASEDEF 3.0 (H) 05/15/2019 0202   O2SAT 100.0 05/15/2019 0202     Coagulation Profile: Recent Labs  Lab 05/14/19 1758  INR 2.2*    Cardiac Enzymes: No results for input(s): CKTOTAL, CKMB, CKMBINDEX, TROPONINI in the last 168 hours.  HbA1C: No results found for: HGBA1C  CBG: No results for input(s): GLUCAP in the last 168 hours.  Review of Systems:   Unable to be obtained due to sedation/intubation  Past Medical History  She,  has a past medical history of Cataract, COPD (chronic obstructive pulmonary disease) (HCC), GERD (gastroesophageal reflux disease), Hypertension, Osteopenia, and Thyroid disease.   Surgical History    Past Surgical History:  Procedure Laterality Date  . CATARACT EXTRACTION, BILATERAL    . CORONARY STENT INTERVENTION N/A 05/14/2019   Procedure: CORONARY STENT INTERVENTION;  Surgeon: Lennette BihariKelly, Thomas A, MD;  [facility];  Service: Cardiovascular;  Laterality: N/A;  . LEFT HEART CATH AND CORONARY ANGIOGRAPHY N/A 05/14/2019   Procedure: LEFT HEART CATH AND CORONARY ANGIOGRAPHY;  Surgeon: Marykay LexHarding, David W, MD;  [facility];  Service: Cardiovascular;  Laterality: N/A;  . LEFT HEART CATH AND CORONARY ANGIOGRAPHY N/A 05/14/2019   Procedure: LEFT HEART CATH AND CORONARY ANGIOGRAPHY;  Surgeon: Lennette BihariKelly, Thomas A, MD;  Location: [facility] INVASIVE CV LAB;  Service: Cardiovascular;  Laterality: N/A;     Social History   reports that she quit  smoking about 24 years ago. She quit smokeless tobacco use about 20 years ago.  Her smokeless tobacco use included chew. She reports that she does not drink alcohol or use drugs.   Family History   Her family history includes Aneurysm in her sister; COPD in her brother and sister; Cancer in her brother and sister; Diabetes in her mother; Early death in her brother, sister, and sister; Emphysema in her brother and sister; Fibromyalgia in her daughter; Hypertension in her daughter, daughter, and son; Stroke in her sister; Tuberculosis in her father.   Allergies Allergies  Allergen Reactions  . Crestor [Rosuvastatin Calcium] Other (See Comments)    Cramping/pain in lower extremeties  . Niaspan [Niacin Er] Other (See Comments)    Cramping/pain in lower extremeties     Home Medications  Prior to Admission medications   Medication Sig Start Date End Date Taking? Authorizing Provider  albuterol (VENTOLIN HFA) 108 (90 Base) MCG/ACT inhaler INHALE 2 PUFFS INTO LUNGS EVERY 6 HOURS AS NEEDED FOR WHEEZING Patient taking differently: Inhale 2 puffs into the lungs every 6 (six) hours as needed for wheezing or shortness of breath.  12/04/18  Yes Daphine DeutscherMartin, [name], [name]  amoxicillin-clavulanate (AUGMENTIN) 875-125 MG tablet Take 1 tablet by mouth 2 (two) times daily. 05/10/19  Yes Daphine DeutscherMartin, [name], [name]  aspirin 81 MG tablet Take 81 mg by mouth daily.   Yes [provider]  benazepril (LOTENSIN) 20 MG tablet Take 1 tablet (20 mg total) by mouth daily. 03/04/19  Yes [name], [name], [name]  budesonide-formoterol (SYMBICORT) 160-4.5 MCG/ACT inhaler Inhale 2 puffs into the lungs 2 (two) times daily. 03/04/19  Yes Daphine DeutscherMartin, [name], [name]  calcium-vitamin D 250-100 MG-UNIT per tablet Take 1 tablet by mouth 2 (two) times daily.    Yes [provider]  fluticasone (FLONASE) 50 MCG/ACT nasal spray Place 2 sprays into both nostrils  daily. 05/09/19  Yes [name], [name], [name]   levothyroxine (SYNTHROID) 75 MCG tablet Take 1 tablet (75 mcg total) by mouth daily. 03/04/19  Yes [name], [name], [name]  polyethylene glycol (MIRALAX / GLYCOLAX) packet Take 17 g by mouth daily.   Yes [provider]  VITAMIN D PO Take 1 capsule by mouth daily.   Yes [provider]     Elige Radon, MD INTERNAL MEDICINE RESIDENT PGY-1 05/15/19 8:05 AM

## 2019-05-16 ENCOUNTER — Inpatient Hospital Stay (HOSPITAL_COMMUNITY): Payer: Medicare Other

## 2019-05-16 DIAGNOSIS — R570 Cardiogenic shock: Secondary | ICD-10-CM

## 2019-05-16 DIAGNOSIS — I361 Nonrheumatic tricuspid (valve) insufficiency: Secondary | ICD-10-CM

## 2019-05-16 LAB — BASIC METABOLIC PANEL
Anion gap: 10 (ref 5–15)
BUN: 34 mg/dL — ABNORMAL HIGH (ref 8–23)
CO2: 18 mmol/L — ABNORMAL LOW (ref 22–32)
Calcium: 7.5 mg/dL — ABNORMAL LOW (ref 8.9–10.3)
Chloride: 116 mmol/L — ABNORMAL HIGH (ref 98–111)
Creatinine, Ser: 1.08 mg/dL — ABNORMAL HIGH (ref 0.44–1.00)
GFR calc Af Amer: 53 mL/min — ABNORMAL LOW (ref >60)
GFR calc non Af Amer: 45 mL/min — ABNORMAL LOW (ref >60)
Glucose, Bld: 108 mg/dL — ABNORMAL HIGH (ref 70–99)
Potassium: 3.6 mmol/L (ref 3.5–5.1)
Sodium: 144 mmol/L (ref 135–145)

## 2019-05-16 LAB — ECHOCARDIOGRAM LIMITED
Height: 61 in
Weight: 2017.65 oz

## 2019-05-16 LAB — CBC
HCT: 35.9 % — ABNORMAL LOW (ref 36.0–46.0)
Hemoglobin: 11.8 g/dL — ABNORMAL LOW (ref 12.0–15.0)
MCH: 29.2 pg (ref 26.0–34.0)
MCHC: 32.9 g/dL (ref 30.0–36.0)
MCV: 88.9 fL (ref 80.0–100.0)
Platelets: 64 10*3/uL — ABNORMAL LOW (ref 150–400)
RBC: 4.04 MIL/uL (ref 3.87–5.11)
RDW: 17.3 % — ABNORMAL HIGH (ref 11.5–15.5)
WBC: 17.6 10*3/uL — ABNORMAL HIGH (ref 4.0–10.5)
nRBC: 0 % (ref 0.0–0.2)

## 2019-05-16 MED ORDER — OXYCODONE HCL 5 MG PO TABS
5.0000 mg | ORAL_TABLET | Freq: Four times a day (QID) | ORAL | Status: DC | PRN
  Administered 2019-05-16 – 2019-05-27 (×6): 5 mg via ORAL
  Filled 2019-05-16 (×7): qty 1

## 2019-05-16 MED ORDER — ORAL CARE MOUTH RINSE
15.0000 mL | Freq: Two times a day (BID) | OROMUCOSAL | Status: DC
  Administered 2019-05-17 – 2019-05-31 (×20): 15 mL via OROMUCOSAL

## 2019-05-16 MED ORDER — AMIODARONE LOAD VIA INFUSION
150.0000 mg | Freq: Once | INTRAVENOUS | Status: AC
  Administered 2019-05-16: 14:00:00 150 mg via INTRAVENOUS
  Filled 2019-05-16: qty 83.34

## 2019-05-16 MED ORDER — IPRATROPIUM-ALBUTEROL 0.5-2.5 (3) MG/3ML IN SOLN
3.0000 mL | Freq: Four times a day (QID) | RESPIRATORY_TRACT | Status: DC
  Administered 2019-05-16 – 2019-05-17 (×5): 3 mL via RESPIRATORY_TRACT
  Filled 2019-05-16 (×5): qty 3

## 2019-05-16 MED ORDER — AMIODARONE IV BOLUS ONLY 150 MG/100ML: 150.0000 mg | Freq: Once | INTRAVENOUS | Status: DC

## 2019-05-16 NOTE — Progress Notes (Signed)
Pt extubated by RT per Md order. Pt tolerating 4l O2 via [place] with no complications at this time. Daughter at the bedside. Will continue to monitor.

## 2019-05-16 NOTE — Progress Notes (Signed)
PCCM Interval Note  Patient weaned off Levophed since 10 am today. Tolerating PS. Extubated this afternoon.   After discussion with patient and daughter at bedside, patient expressed wish for NO chest compressions however is ok with intubation if needed. Will change status to partial DNR.  Rodman Pickle, M.D. [facility] Pulmonary/Critical Care Medicine 05/16/2019 1:52 PM

## 2019-05-16 NOTE — Progress Notes (Signed)
      [address]       [place] [address]             [phone]                 2 Days Post-Op Procedure(s) (LRB): Mediastinal Exploration with Washout (N/A)   Events: Remains on some levo _______________________________________________________________ Vitals: BP (!) 126/96   Pulse 66   Temp 100.2 F (37.9 C)   Resp (!) 28   Ht 5\' 1"  (1.549 m)   Wt 57.2 kg   SpO2 99%   BMI 23.83 kg/m   - Neuro: awake, follows commands  - Cardiovascular: sinus  Drips: Levo 4 CVP:  [11 mmHg] 11 mmHg  - Pulm:  Vent Mode: PSV;CPAP FiO2 (%):  [40 %-50 %] 40 % Set Rate:  [12 bmp] 12 bmp Vt Set:  [470 mL] 470 mL PEEP:  [5 cmH20] 5 cmH20 Pressure Support:  [8 cmH20-10 cmH20] 8 cmH20 Plateau Pressure:  [16 cmH20-21 cmH20] 19 cmH20  ABG    Component Value Date/Time   PHART 7.380 05/15/2019 0202   PCO2ART 37.9 05/15/2019 0202   PO2ART 240.0 (H) 05/15/2019 0202   HCO3 23.2 05/15/2019 0202   TCO2 22 05/15/2019 0220   ACIDBASEDEF 3.0 (H) 05/15/2019 0202   O2SAT 100.0 05/15/2019 0202    - Abd: soft - Extremity: warm  .Intake/Output      09/30 0701 - 10/01 0700 10/01 0701 - 10/02 0700   P.O. 0    I.V. (mL/kg) 728.8 (12.7) 501.3 (8.8)   NG/GT 90    IV Piggyback 50.1 110   Total Intake(mL/kg) 868.9 (15.2) 611.4 (10.7)   Urine (mL/kg/hr) 475 (0.3)    Emesis/NG output 0    Stool     Blood     Chest Tube 50    Total Output 525    Net +343.9 +611.4           _______________________________________________________________ Labs: CBC Latest Ref Rng & Units 05/16/2019 05/15/2019 05/15/2019  WBC 4.0 - 10.5 K/uL 17.6(H) 20.1(H) -  Hemoglobin 12.0 - 15.0 g/dL 11.8(L) 12.2 8.2(L)  Hematocrit 36.0 - 46.0 % 35.9(L) 37.6 24.0(L)  Platelets 150 - 400 K/uL 64(L) 81(L) -   CMP Latest Ref Rng & Units 05/15/2019 05/15/2019 05/15/2019  Glucose 70 - 99 mg/dL 175(H) 142(H) -  BUN 8 - 23 mg/dL 28(H) 25(H) -  Creatinine 0.44 - 1.00 mg/dL 0.89 0.60 -  Sodium 135 - 145 mmol/L  140 145 145  Potassium 3.5 - 5.1 mmol/L 4.5 3.3(L) 3.5  Chloride 98 - 111 mmol/L 112(H) 108 -  CO2 22 - 32 mmol/L 20(L) - -  Calcium 8.9 - 10.3 mg/dL 6.5(L) - -  Total Protein 6.5 - 8.1 g/dL - - -  Total Bilirubin 0.3 - 1.2 mg/dL - - -  Alkaline Phos 38 - 126 U/L - - -  AST 15 - 41 U/L - - -  ALT 0 - 44 U/L - - -    CXR: clear  _______________________________________________________________  Assessment and Plan: POD 1 s/p sternotomy, mediastinal washout  Neuro: on minimal sedation CV: will keep chest tubes while anticoagulating for stent.  Will likely remove tomorrow.   Pulm: wean to extubation Renal: creat stable Dispo: per primary  Melodie Bouillon, MD 05/16/2019 10:18 AM

## 2019-05-16 NOTE — Progress Notes (Signed)
  Echocardiogram 2D Echocardiogram has been performed.  Amy Chambers 05/16/2019, 11:14 AM

## 2019-05-16 NOTE — Progress Notes (Signed)
Pt back in Afib with rate up to 130's. Pt remains on amiodarone. BP WNL at this time off of Levophed. Md notified. Will continue to monitor.

## 2019-05-16 NOTE — Progress Notes (Signed)
NAME:  Amy Chambers, MRN:  [id number], DOB:  Nov 05, 1927, LOS: 6 ADMISSION DATE:  05/10/2019, CONSULTATION DATE:  05/16/19 REFERRING MD: Kipp Brood , CHIEF COMPLAINT:  Cardiac tamponade  Brief History   85 yo female admitted for acute respiratory failure and was found to have NSTEMI. PCI performed and was found to have 80% stenosis of mid RCA with DES placement. Acutely decompensated after returning to floor and was found to have cardiac tamponade requiring mediastinal washout with 223mL evacuated from pericardial space.  History of present illness   85 yo female who was transferred from [facility] long hospital for acute hypoxic respiratory failure. Pt was found to have NSTEMI and was taken to cath lab where 2 DES were placed to RCA. Patient developed acute decompensation where she was found to have hemopericardium. She was taken to OR by CT surg for mediastinal washout at which time 268mL was evacuated from the pericardial space.   Past Medical History  HTN CAD Hypothyroidism Hyperlipidemia  Consults:  PCCM Cardiology CT surgery  Procedures:  9/29 LHC 9/29 mediastinal washout for cardiac tamponade 9/30 Remains on pressor support 10/1 DC Precedex to facilitate pressor weaning  Significant Diagnostic Tests:  9/29 LHC: 80% stenosis of prox RCA. LAD 20% stenosis. 40% stenosis of prox circumflex.   9/29 ECHO: EF 45-50%. Mild hypokinesis of inferior septum/wall. Impaired diastolic dysfunction. Pericardial effusion to right ventricle.  Micro Data:  9/25 COVID NEG 9/25 Blood cultures - Neg  Antimicrobials:  Rocephin 9/26>>  Interim history/subjective:  Remains on vasopressor support. Awake, following commands.  Objective   Blood pressure (!) 97/48, pulse 60, temperature (!) 100.4 F (38 C), resp. rate (!) 54, height 5\' 1"  (1.549 m), weight 57.2 kg, SpO2 100 %. CVP:  [11 mmHg] 11 mmHg  Vent Mode: PRVC FiO2 (%):  [40 %-50 %] 40 % Set Rate:  [12 bmp] 12 bmp Vt Set:  [470 mL] 470 mL  PEEP:  [5 cmH20] 5 cmH20 Pressure Support:  [8 cmH20-10 cmH20] 8 cmH20 Plateau Pressure:  [16 cmH20-21 cmH20] 19 cmH20   Intake/Output Summary (Last 24 hours) at 05/16/2019 0751 Last data filed at 05/16/2019 0600 Gross per 24 hour  Intake 868.9 ml  Output 525 ml  Net 343.9 ml   Filed Weights   05/12/19 0608 05/13/19 0341 05/14/19 0127  Weight: 53.5 kg 53.7 kg 57.2 kg   Physical Exam: General: Elderly, well-appearing, no acute distress HENT: [place], AT, ETT in place Eyes: EOMI, no scleral icterus Respiratory: Vented breath sounds.  No crackles, wheezing or rales Cardiovascular: RRR, -M/R/G, no JVD GI: BS+, soft, nontender Extremities:-Edema,-tenderness Neuro: Awake, CNII-XII grossly intact Skin: Sternotomy incision, no erythema GU: Foley in place  Assessment & Plan:  85 year old with HTN who was admitted for NSTEMI. Underwent cath for PCI/DES to mid-RCA x 2 stents. Post-procedure developed hypotension and acute blood loss anemia and found with hemopericardium with resultant tamponade. She was emergently taken to OR for pericardial window.   Cardiogenic shock secondary to pericardial tamponade  Wean levophed for goal MAP >65 Post-op and chest tube management per CTS and Cardiology  NSTEMI s/p DES to RCA c/b hemopericardium AFRVR Telemetry Continue DAPT per Cardiology Continue amio  Acute blood loss anemia s/p 3U PRBC Trend CBC No additional blood products needed. Goal Hg >8  Acute respiratory hypoxemic failure/insufficiency COPD exacerbation Primary team plans to extubate post-op.  Completed steroids and abx for 5 day course  Sedation on mechanical ventilation  DC precedex to facilitate weaning of  pressor and extubation  Best practice:  Diet: NPO Pain/Anxiety/Delirium protocol (if indicated):  VAP protocol  DVT prophylaxis: SCDs Mobility: PT after extubation Code Status: Full Family Communication: Daughter updated 10/1 Disposition: Remain in ICU  Labs    CBC: Recent Labs  Lab 05/10/19 1303  05/13/19 0853 05/14/19 0328 05/14/19 1758  05/15/19 0058 05/15/19 0200 05/15/19 0202 05/15/19 0220 05/15/19 0643  WBC 22.2*   < > 6.7 6.8 12.2*  --   --  18.7*  --   --  20.1*  NEUTROABS 12.6*  --   --   --   --   --   --   --   --   --   --   HGB 13.9   < > 11.7* 10.9* 10.5*   < > 8.8* 8.7* 8.2* 8.2* 12.2  HCT 46.3*   < > 37.4 35.2* 32.3*   < > 26.0* 28.1* 24.0* 24.0* 37.6  MCV 98.7   < > 95.2 95.9 94.4  --   --  94.3  --   --  90.2  PLT 320   < > 153 137* 306  --   --  52*  --   --  81*   < > = values in this interval not displayed.    Basic Metabolic Panel: Recent Labs  Lab 05/11/19 0458 05/13/19 9211 05/14/19 0328 05/14/19 1758 05/14/19 1809  05/15/19 0026 05/15/19 0058 05/15/19 0202 05/15/19 0220 05/15/19 0643  NA 141 140 139 135 137   < > 141 142 145 145 140  K 4.7 4.2 4.2 4.5 4.5   < > 4.8 3.9 3.5 3.3* 4.5  CL 104 103 105 106 105  --  109  --   --  108 112*  CO2 25 27 27  21*  --   --   --   --   --   --  20*  GLUCOSE 141* 137* 152* 275* 271*  --  163*  --   --  142* 175*  BUN 24* 38* 37* 28* 30*  --  29*  --   --  25* 28*  CREATININE 0.89 0.76 0.70 0.66 0.60  --  0.50  --   --  0.60 0.89  CALCIUM 9.1 8.8* 8.2* 7.7*  --   --   --   --   --   --  6.5*   < > = values in this interval not displayed.   GFR: Estimated Creatinine Clearance: 32.3 mL/min (by C-G formula based on SCr of 0.89 mg/dL). Recent Labs  Lab 05/10/19 1527 05/10/19 1715  05/14/19 0328 05/14/19 1758 05/15/19 0200 05/15/19 0643  WBC  --   --    < > 6.8 12.2* 18.7* 20.1*  LATICACIDVEN 1.7 1.9  --   --   --   --  1.9   < > = values in this interval not displayed.    Liver Function Tests: Recent Labs  Lab 05/10/19 1303  AST 38  ALT 32  ALKPHOS 65  BILITOT 0.5  PROT 7.3  ALBUMIN 4.0   No results for input(s): LIPASE, AMYLASE in the last 168 hours. No results for input(s): AMMONIA in the last 168 hours.  ABG    Component Value Date/Time    PHART 7.380 05/15/2019 0202   PCO2ART 37.9 05/15/2019 0202   PO2ART 240.0 (H) 05/15/2019 0202   HCO3 23.2 05/15/2019 0202   TCO2 22 05/15/2019 0220   ACIDBASEDEF 3.0 (H) 05/15/2019 0202  O2SAT 100.0 05/15/2019 0202     Coagulation Profile: Recent Labs  Lab 05/14/19 1758  INR 2.2*    Cardiac Enzymes: No results for input(s): CKTOTAL, CKMB, CKMBINDEX, TROPONINI in the last 168 hours.  HbA1C: No results found for: HGBA1C  CBG: No results for input(s): GLUCAP in the last 168 hours.  The patient is critically ill with multiple organ systems failure and requires high complexity decision making for assessment and support, frequent evaluation and titration of therapies, application of advanced monitoring technologies and extensive interpretation of multiple databases.   Critical Care Time devoted to patient care services described in this note is 35 Minutes.   Mechele CollinJane Kentrell Guettler, M.D. [facility] 05/16/2019 7:52 AM  Pager: [phone] After hours pager: [phone]

## 2019-05-16 NOTE — Procedures (Signed)
Extubation Procedure Note  Patient Details:   Name: Amy Chambers DOB: Jan 15, 1928 MRN: [id number]   Airway Documentation:    Vent end date: 05/16/19 Vent end time: 1251   Evaluation  O2 sats: stable throughout Complications: No apparent complications Patient did tolerate procedure well. Bilateral Breath Sounds: Diminished, Clear   Yes   Pt extubated per MD order. RN and RT at bedside during procedure. Cuff leak heard prior to ETT removal. Pt now on 4L [place] at 98%. Pt is able to tell me her name vocally. No apparent complications at this time.   Esperanza Sheets T 05/16/2019, 12:52 PM

## 2019-05-16 NOTE — Progress Notes (Signed)
[facility] BRIEF SICU PROGRESS NOTE  2 Days Post-Op  S/P Procedure(s) (LRB): Mediastinal Exploration with Washout (N/A)   Stable day Extubated uneventfully Chest tube output low  Plan: Per primary team  Rexene Alberts, MD 05/16/2019 6:50 PM

## 2019-05-16 NOTE — Progress Notes (Signed)
Cardiology Progress Note  Patient ID: Amy Chambers MRN: [id number] DOB: 1927-08-30 Date of Encounter: 05/16/2019  Primary Cardiologist: No primary care provider on file.  Subjective  Precedex off this morning with improvement in heart rate and blood pressure.  She remains in normal sinus rhythm with heart rate around 70.  PACs noted.  She has been restarted on DAPT, and appears to tolerate this well.  Labs pending.  She remains on Levophed, but this is being weaned per nursing.  She will follow commands, but is unable to communicate due to the fact that she is ventilated.  ROS:  All other ROS reviewed and negative. Pertinent positives noted in the HPI.     Inpatient Medications  Scheduled Meds:  aspirin  81 mg Oral Once   atorvastatin  40 mg Oral q1800   chlorhexidine gluconate (MEDLINE KIT)  15 mL Mouth Rinse BID   Chlorhexidine Gluconate Cloth  6 each Topical Daily   guaiFENesin  600 mg Oral BID   influenza vaccine adjuvanted  0.5 mL Intramuscular Tomorrow-1000   levothyroxine  75 mcg Oral Daily   loratadine  10 mg Oral Daily   mouth rinse  15 mL Mouth Rinse 10 times per day   mometasone-formoterol  2 puff Inhalation BID   pantoprazole  40 mg Oral Q0600   pneumococcal 23 valent vaccine  0.5 mL Intramuscular Tomorrow-1000   polyethylene glycol  17 g Oral Daily   sodium chloride flush  10-40 mL Intracatheter Q12H   sodium chloride flush  3 mL Intravenous Q12H   sodium chloride flush  3 mL Intravenous Q12H   sodium chloride flush  3 mL Intravenous Q12H   ticagrelor  90 mg Oral BID   Continuous Infusions:  sodium chloride Stopped (05/15/19 0600)   amiodarone 30 mg/hr (05/15/19 2100)   cefTRIAXone (ROCEPHIN)  IV 10 mL/hr at 05/15/19 2100   dexmedetomidine (PRECEDEX) IV infusion Stopped (05/16/19 0751)   norepinephrine (LEVOPHED) Adult infusion 6 mcg/min (05/16/19 0751)   PRN Meds: sodium chloride, acetaminophen, albuterol, morphine injection, ondansetron  **OR** ondansetron (ZOFRAN) IV, sodium chloride flush, sodium chloride flush, traMADol, traZODone   Vital Signs   Vitals:   05/16/19 0500 05/16/19 0600 05/16/19 0700 05/16/19 0801  BP:    (!) 126/96  Pulse: 64 (!) 52 60 68  Resp: 19 (!) 44 (!) 54 (!) 24  Temp:   (!) 100.4 F (38 C) 100.2 F (37.9 C)  TempSrc:      SpO2: 100% 100% 100% 100%  Weight:      Height:        Intake/Output Summary (Last 24 hours) at 05/16/2019 0803 Last data filed at 05/16/2019 0600 Gross per 24 hour  Intake 811.06 ml  Output 515 ml  Net 296.06 ml   Last 3 Weights 05/14/2019 05/13/2019 05/12/2019  Weight (lbs) 126 lb 1.7 oz 118 lb 6.4 oz 117 lb 14.4 oz  Weight (kg) 57.2 kg 53.706 kg 53.479 kg      Telemetry  Overnight telemetry shows normal sinus rhythm with heart rate in the 60-70 range, PACs noted, which I personally reviewed.   Physical Exam   Vitals:   05/16/19 0500 05/16/19 0600 05/16/19 0700 05/16/19 0801  BP:    (!) 126/96  Pulse: 64 (!) 52 60 68  Resp: 19 (!) 44 (!) 54 (!) 24  Temp:   (!) 100.4 F (38 C) 100.2 F (37.9 C)  TempSrc:      SpO2: 100% 100% 100% 100%  Weight:  Height:         Intake/Output Summary (Last 24 hours) at 05/16/2019 0803 Last data filed at 05/16/2019 0600 Gross per 24 hour  Intake 811.06 ml  Output 515 ml  Net 296.06 ml    Last 3 Weights 05/14/2019 05/13/2019 05/12/2019  Weight (lbs) 126 lb 1.7 oz 118 lb 6.4 oz 117 lb 14.4 oz  Weight (kg) 57.2 kg 53.706 kg 53.479 kg    Body mass index is 23.83 kg/m.  General: Intubated on vent, will follow commands Head: Atraumatic, normal size  Eyes: PEERLA, EOMI  Neck: Supple, no JVD Endocrine: No thryomegaly Cardiac: Regular rate and rhythm, pericardial rub noted Lungs: Diminished breath sounds bilaterally Abd: Soft, nontender, no hepatomegaly  Ext: Trace edema Musculoskeletal: No deformities Skin: Midline sternotomy scar noted, no evidence of infection Neuro: Intubated sedated on vent  Labs  High  Sensitivity Troponin:   Recent Labs  Lab 05/10/19 1303 05/10/19 1527 05/10/19 1906 05/12/19 0653 05/12/19 0908  TROPONINIHS 44* 499* 975* 1,563* 1,288*     Cardiac EnzymesNo results for input(s): TROPONINI in the last 168 hours. No results for input(s): TROPIPOC in the last 168 hours.  Chemistry Recent Labs  Lab 05/10/19 1303  05/14/19 0328 05/14/19 1758  05/15/19 0026  05/15/19 0202 05/15/19 0220 05/15/19 0643  NA 138   < > 139 135   < > 141   < > 145 145 140  K 4.3   < > 4.2 4.5   < > 4.8   < > 3.5 3.3* 4.5  CL 104   < > 105 106   < > 109  --   --  108 112*  CO2 23   < > 27 21*  --   --   --   --   --  20*  GLUCOSE 195*   < > 152* 275*   < > 163*  --   --  142* 175*  BUN 27*   < > 37* 28*   < > 29*  --   --  25* 28*  CREATININE 0.78   < > 0.70 0.66   < > 0.50  --   --  0.60 0.89  CALCIUM 8.6*   < > 8.2* 7.7*  --   --   --   --   --  6.5*  PROT 7.3  --   --   --   --   --   --   --   --   --   ALBUMIN 4.0  --   --   --   --   --   --   --   --   --   AST 38  --   --   --   --   --   --   --   --   --   ALT 32  --   --   --   --   --   --   --   --   --   ALKPHOS 65  --   --   --   --   --   --   --   --   --   BILITOT 0.5  --   --   --   --   --   --   --   --   --   GFRNONAA >60   < > >60 >60  --   --   --   --   --  57*  GFRAA >60   < > >60 >60  --   --   --   --   --  >60  ANIONGAP 11   < > 7 8  --   --   --   --   --  8   < > = values in this interval not displayed.    Hematology Recent Labs  Lab 05/14/19 1758  05/15/19 0200 05/15/19 0202 05/15/19 0220 05/15/19 0643  WBC 12.2*  --  18.7*  --   --  20.1*  RBC 3.42*  --  2.98*  --   --  4.17  HGB 10.5*   < > 8.7* 8.2* 8.2* 12.2  HCT 32.3*   < > 28.1* 24.0* 24.0* 37.6  MCV 94.4  --  94.3  --   --  90.2  MCH 30.7  --  29.2  --   --  29.3  MCHC 32.5  --  31.0  --   --  32.4  RDW 13.5  --  16.1*  --   --  16.6*  PLT 306  --  52*  --   --  81*   < > = values in this interval not displayed.   BNP Recent Labs    Lab 05/10/19 1303 05/11/19 1437  BNP 211.0* 1,643.2*    DDimer No results for input(s): DDIMER in the last 168 hours.   Radiology  Ct Abdomen Pelvis Wo Contrast  Result Date: 05/14/2019 CLINICAL DATA:  85 year old female with back pain and hypertension. Status post catheterization. Rule out retroperitoneal hematoma. EXAM: CT ABDOMEN AND PELVIS WITHOUT CONTRAST TECHNIQUE: Multidetector CT imaging of the abdomen and pelvis was performed following the standard protocol without IV contrast. COMPARISON:  Chest CT dated 05/10/2019 FINDINGS: Evaluation of this exam is limited in the absence of intravenous contrast. Lower chest: Mild emphysematous changes of the visualized lung bases. Several punctate calcified granuloma at the left lung base. Probable trace right pleural effusion. Partially visualized high attenuating pericardial effusion measuring up to 8 mm in thickness most consistent with hemopericardium. This is new since the prior CT. Clinical correlation is recommended. There is coronary vascular calcification. No intra-abdominal free air or free fluid. No intraperitoneal or retroperitoneal hematoma. Hepatobiliary: Several hepatic hypodense lesions which are not completely characterized this non-contrast CT and measure up to approximately 3 cm. These demonstrate fluid attenuation most consistent with cysts. High attenuating content within the gallbladder likely represents vicarious excretion of injected contrast from recent catheterization. Small gallstones are not excluded. No pericholecystic fluid. Pancreas: Unremarkable. No pancreatic ductal dilatation or surrounding inflammatory changes. Spleen: Normal in size without focal abnormality. Adrenals/Urinary Tract: The adrenal glands are unremarkable. Enhancing renal parenchyma secondary to recent contrast administration. Multiple bilateral renal cysts measure up to 3.3 cm on the left. Additional smaller hypodensities are too small to characterize. There  is no hydronephrosis on either side. Excreted contrast noted the partially distended urinary bladder. Stomach/Bowel: There is sigmoid diverticulosis without active inflammatory changes. There is no bowel obstruction or active inflammation. The appendix is normal. There is a 4 cm duodenal diverticula. Vascular/Lymphatic: Advanced aortoiliac atherosclerotic disease. Left femoral venous line with tip in the left common femoral vein. Right common iliac artery catheterization with tip in the right external iliac artery. No hematoma. The IVC is grossly unremarkable. Small pocket of air in the upper IVC, likely introduced via catheterization. No portal venous gas. There is no adenopathy. Reproductive: The uterus and ovaries are grossly unremarkable. Other:  None Musculoskeletal: Osteopenia with degenerative changes of the spine. No acute osseous pathology. IMPRESSION: 1. No acute intra-abdominal or pelvic pathology. No intraperitoneal or retroperitoneal hematoma. 2. Partially visualized hemopericardium. Clinical correlation is recommended. 3. Small pocket of air within the upper IVC, iatrogenic. 4. Sigmoid diverticulosis. 5. Advanced aortic Atherosclerosis (ICD10-I70.0). These results were called by telephone at the time of interpretation on 05/14/2019 at 7:52 pm to provider O'Neal, who verbally acknowledged these results. Electronically Signed   By: Anner Crete M.D.   On: 05/14/2019 19:56   Dg Abd 1 View  Result Date: 05/15/2019 CLINICAL DATA:  Orogastric tube placement. EXAM: ABDOMEN - 1 VIEW COMPARISON:  07/02/2018 and chest x-ray 05/15/2019 FINDINGS: Evidence of patient's nasogastric tube with tip over the mid abdomen just right of midline likely over the distal stomach. Bowel gas pattern is nonobstructive. Remainder of the exam is unchanged including 2 catheters overlying the heart. IMPRESSION: Nonobstructive bowel gas pattern. Enteric tube with tip just right of midline in the mid abdomen likely over the  distal stomach. Electronically Signed   By: Marin Olp M.D.   On: 05/15/2019 16:46   Dg Chest Port 1 View  Result Date: 05/15/2019 CLINICAL DATA:  Central line placement EXAM: PORTABLE CHEST 1 VIEW COMPARISON:  05/15/2019, 5:23 a.m. FINDINGS: Interval placement of a left neck vascular catheter, tip projecting over the midportion of the SVC. Otherwise unchanged examination with endotracheal tube projecting above the carina, chest or mediastinal drainage tubes, and no acute abnormality of the lungs and no significant pneumothorax. IMPRESSION: 1. Interval placement of a left neck vascular catheter, tip projecting over the midportion of the SVC. 2. Otherwise unchanged examination with endotracheal tube projecting above the carina, chest or mediastinal drainage tubes, and no acute abnormality of the lungs and no significant pneumothorax. Electronically Signed   By: Eddie Candle M.D.   On: 05/15/2019 11:33   Dg Chest Port 1 View  Result Date: 05/15/2019 CLINICAL DATA:  ET tube, chest tube, pneumothorax EXAM: PORTABLE CHEST 1 VIEW COMPARISON:  05/12/2019 FINDINGS: Endotracheal tube tip is 2.5 cm above the carina. Left basilar chest tube or mediastinal drain is in place. No pneumothorax. Bibasilar atelectasis. No visible effusions. IMPRESSION: Bibasilar atelectasis. No visible pneumothorax. Electronically Signed   By: Rolm Baptise M.D.   On: 05/15/2019 08:29   Ct Angio Chest/abd/pel For Dissection W And/or W/wo  Result Date: 05/14/2019 CLINICAL DATA:  85 year old female status post cardiac catheterization. Concern for dissection. EXAM: CT ANGIOGRAPHY CHEST, ABDOMEN AND PELVIS TECHNIQUE: Multidetector CT imaging through the chest, abdomen and pelvis was performed using the standard protocol during bolus administration of intravenous contrast. Multiplanar reconstructed images and MIPs were obtained and reviewed to evaluate the vascular anatomy. CONTRAST:  132m OMNIPAQUE IOHEXOL 350 MG/ML SOLN COMPARISON:   Earlier CT of the abdomen pelvis dated 05/14/2019 FINDINGS: CTA CHEST FINDINGS Cardiovascular: There is no cardiomegaly. High attenuating pericardial effusion measures up to 11 mm in thickness. This is most consistent with hemopericardium. There is multi vessel coronary vascular calcification. Evaluation of the heart is limited due to cardiac motion. There is moderate calcified and noncalcified plaque of the thoracic aorta. No aneurysmal dilatation or dissection. The visualized origins of the great vessels of the aortic arch appear patent. There is a diminutive left vertebral artery. The central pulmonary arteries appear patent. Mediastinum/Nodes: No hilar or mediastinal adenopathy. The esophagus is grossly unremarkable. No mediastinal fluid collection. Lungs/Pleura: There is emphysematous changes of the lungs. Trace bilateral pleural effusions. No focal consolidation or  pneumothorax. The central airways are patent. Musculoskeletal: Degenerative changes of the spine. No acute osseous pathology. Review of the MIP images confirms the above findings. CTA ABDOMEN AND PELVIS FINDINGS VASCULAR Aorta: Advanced atherosclerotic calcification. No aneurysmal dilatation or dissection. Celiac: Atherosclerotic calcification and narrowing of the origin of the celiac axis. The celiac axis remains patent. SMA: Atherosclerotic calcification and narrowing of the origin of the SMA. The SMA is patent. Renals: Atherosclerotic calcification of the origins of the renal arteries. The renal arteries remain patent although appear narrowed. Accessory arteries noted to the lower pole of the kidneys bilaterally. IMA: The IMA is patent. Inflow: Advanced atherosclerotic calcification of the iliac arteries. There is irregularity of the common iliac arteries with focal ectasia of the right common iliac artery measuring up to 13 mm. The iliac arteries remain patent. Right femoral artery catheter with tip in the right external iliac artery noted.  Veins: No obvious venous abnormality within the limitations of this arterial phase study. A small pocket of air in the upper IVC, iatrogenic. Left common femoral vein catheter with tip left common iliac vein. No fluid collection or hematoma. Review of the MIP images confirms the above findings. NON-VASCULAR Hepatobiliary: Multiple hepatic hypodense lesions, likely cysts. Mild irregularity of the liver contour likely cirrhosis. There appears to be partial cannulization of the paraumbilical vein indicative of portal hypertension. No intrahepatic biliary ductal dilatation. Excreted contrast within the gallbladder. Pancreas: Unremarkable. No pancreatic ductal dilatation or surrounding inflammatory changes. Spleen: Normal in size without focal abnormality. Adrenals/Urinary Tract: The adrenal glands are unremarkable. Bilateral renal cysts measuring up to 4 cm and smaller hypodense lesions which are not characterized. Hyperenhancement of the renal parenchyma concerning for a degree of renal dysfunction. Clinical correlation is recommended. No hydronephrosis. The urinary bladder is filled with excreted contrast. Stomach/Bowel: There is a 4 cm duodenal diverticulum. There is sigmoid diverticulosis without active inflammatory changes. A rectal tube is in place. There is no bowel obstruction. The appendix is normal. Lymphatic: No adenopathy. Reproductive: The uterus and ovaries are grossly unremarkable. Other: No intra-abdominal fluid or free air. No hematoma. Musculoskeletal: Osteopenia with degenerative changes of the spine. No acute osseous pathology. Review of the MIP images confirms the above findings. IMPRESSION: 1. No CT evidence of aortic dissection or aneurysm.  No hematoma. 2. High attenuating pericardial effusion most consistent with hemopericardium. 3. Coronary vascular calcification. 4. Colonic diverticulosis. No bowel obstruction or active inflammation. Normal appendix. 5. Hyperenhancement of the renal parenchyma  concerning for a degree of renal dysfunction. Clinical correlation is recommended. 6. Aortic Atherosclerosis (ICD10-I70.0) and Emphysema (ICD10-J43.9). Electronically Signed   By: Anner Crete M.D.   On: 05/14/2019 21:03   Patient Profile  Amy Chambers is a 85 y.o. female with Chestine Belknap Mabeis a 85 y.o.femalewith history of COPD, hypertension, GERD who was admitted as a direct transfer on 9/25 for acute hypoxic respiratory failure secondary to COPD exacerbation versus pneumonia. She was subsequently found to have elevated cardiac enzymes concerning for non-STEMI.   She was taken to the cardiac catheterization laboratory on 9/29 and had intervention to her proximal right coronary artery.  Course has been complicated by coronary artery perforation, cardiac tamponade/hemopericardium.  She was taken to the operating room on 9/29 for pericardial washout.  Assessment & Plan   1.  Cardiogenic shock secondary to pericardial tamponade -Cardiac cath on 9/29 with PCI to the proximal right RCA.  Postprocedure she developed acute hypotension with abdominal pain.  There were concerns for a retroperitoneal  bleed.  CT abdomen pelvis did not demonstrate RP bleed, but did demonstrate hemopericardium.  CT chest dissection protocol did not show an acute aortic dissection.  She was taken back to the cardiac catheterization laboratory and repeat coronary angiogram did not demonstrate any active coronary perforation.  Echocardiogram overnight demonstrated acute pericardial thrombus with cardiac tamponade, setting her clinical picture.  She was taken to the operating room for pericardial washout 9/29. -Chest tube management per cardiothoracic surgery -Seems to be improving -Hypotension overnight on norepinephrine, critical care medicine has plans to cut off Precedex with improvement in blood pressure noted -Hopefully, we can get her extubated today -Wean norepinephrine as able with map goal greater than 60  2.   Non-STEMI status post PCI to proximal RCA -She was admitted to transfer from an outside hospital with elevated troponin (high-sensitivity around 1500) -Echocardiogram demonstrated wall motion abnormalities in the RCA territory with mildly reduced ejection fraction 45 to 50%. -She is status post PCI to the proximal RCA 9/29 -We have rechallenged her with aspirin and Brilinta and she seems to be doing well with this without increased output from her chest tube -We will continue her on high intensity statin  3.  Acute systolic heart failure, ejection fraction 45 to 50% with wall motion abnormalities in the RCA distribution -She was admitted with likely mixed non-STEMI as well as acute systolic heart failure -She was effectively diuresed, but she has been complicated by the above course -Holding on any medications until she is off pressors and out of the ICU  4.  Acute blood loss anemia in the setting of hemopericardium -Seems to be resolved with transfusion -She has received 3 units of packed red cells  5.  Thrombocytopenia -Platelets yesterday 81, likely related to recent surgery -I do not suspect heparin-induced thrombocytopenia -Labs this morning pending  6.  Atrial fibrillation with rapid ventricular response -She developed A. fib after her procedure on 9/29.  She has since converted on IV amiodarone therapy to normal sinus rhythm. -We will continue IV amiodarone therapy for now and transition to a p.o. load when she is extubated -Given the brief duration, and likely secondary picture, I think we will hold on anticoagulation  7.  Hypertension -Hold all home antihypertensives in setting of acute loss anemia, cardiogenic shock, likely vasogenic shock related to recent procedure  8.  COPD exacerbation versus pneumonia -Was originally admitted for this purpose -She has completed 5 days of IV steroid therapy and we will stop this -We will complete a 5-day course of antibiotics and  then stop this -We will initiate DuoNebs in the next day or 2  9.  Hypothyroidism -Home Synthroid  FEN -Maintenance fluids 75 cc/h -N.p.o. on vent.  Will hold on tube feeds expect she can be extubated today. -Lines: Radial A-line, left internal jugular central venous catheter -Code: Full -DVT: SCDs  CRITICAL CARE Performed by: [name]  Total critical care time: 45 minutes. Critical care time was exclusive of separately billable procedures and treating other patients. Critical care was necessary to treat or prevent imminent or life-threatening deterioration. Critical care was time spent personally by me on the following activities: development of treatment plan with patient and/or surrogate as well as nursing, discussions with consultants, evaluation of patient's response to treatment, examination of patient, obtaining history from patient or surrogate, ordering and performing treatments and interventions, ordering and review of laboratory studies, ordering and review of radiographic studies, pulse oximetry and re-evaluation of patient's condition.  For  questions or updates, please contact [place] Please consult [email or web address] for contact info under     Signed, Lake Bells T. Audie Box, [place]  05/16/2019 8:03 AM

## 2019-05-16 NOTE — Anesthesia Postprocedure Evaluation (Signed)
Anesthesia Post Note  Patient: Amy Chambers  Procedure(s) Performed: Mediastinal Exploration with Washout (N/A Chest)     Patient location during evaluation: SICU Anesthesia Type: General Level of consciousness: awake and alert, patient cooperative and oriented Pain management: pain level controlled Vital Signs Assessment: post-procedure vital signs reviewed and stable Respiratory status: spontaneous breathing, nonlabored ventilation, respiratory function stable and patient connected to nasal cannula oxygen Cardiovascular status: blood pressure returned to baseline and stable Postop Assessment: no apparent nausea or vomiting (able to take Ice chips ) Anesthetic complications: no    Last Vitals:  Vitals:   05/16/19 1500 05/16/19 1600  BP: 131/65 128/62  Pulse: 80 79  Resp: (!) 24 (!) 27  Temp: 37.9 C (!) 38 C  SpO2: 100% 100%    Last Pain:  Vitals:   05/16/19 1600  TempSrc: Bladder  PainSc:                  Barnell Shieh,E. Shristi Scheib

## 2019-05-16 NOTE — Progress Notes (Signed)
Nutrition Follow-up  DOCUMENTATION CODES:   Not applicable  INTERVENTION:   - Once diet advanced, Ensure Enlive po BID, each supplement provides 350 kcal and 20 grams of protein  NUTRITION DIAGNOSIS:   Inadequate oral intake related to inability to eat as evidenced by NPO status.  Ongoing  GOAL:   Patient will meet greater than or equal to 90% of their needs  Unmet at this time  MONITOR:   Diet advancement, Labs, Weight trends, Skin, I & O's  REASON FOR ASSESSMENT:   Ventilator    ASSESSMENT:   85 year old female who presented to the ED on 9/25 with SOB. PMH of COPD, HTN, HLD, GERD. Pt admitted with NSTEMI.  9/29 - left heart cath, CT scan showing new pericardial effusion, s/p pericardial window 10/1 - extubated  Discussed pt with RN and during ICU rounds.  Pt remains NPO after extubation. RD will monitor for diet advancement and order oral nutrition supplements as appropriate.  Medications reviewed and include: Protonix, Miralax, amiodarone, IV abx  Labs reviewed: BUN 34, creatinine 1.08  UOP: 475 ml x 24 hours CT: 50 ml x 24 hours I/O's: +3.6 L since admit  Diet Order:   Diet Order            Diet NPO time specified  Diet effective now              EDUCATION NEEDS:   No education needs have been identified at this time  Skin:  Skin Assessment: Skin Integrity Issues: Skin Integrity Issues: Incisions: closed incision to chest  Last BM:  05/14/19  Height:   Ht Readings from Last 1 Encounters:  05/11/19 5\' 1"  (1.549 m)    Weight:   Wt Readings from Last 1 Encounters:  05/14/19 57.2 kg    Ideal Body Weight:  47.7 kg  BMI:  Body mass index is 23.83 kg/m.  Estimated Nutritional Needs:   Kcal:  1300-1500  Protein:  70-85 grams  Fluid:  >/= 1.3 L    Gaynell Face, MS, RD, LDN Inpatient Clinical Dietitian Pager: [phone] Weekend/After Hours: [phone]

## 2019-05-16 NOTE — Evaluation (Signed)
Physical Therapy Re-Evaluation Patient Details Name: Amy Chambers MRN: [id number] DOB: June 28, 1928 Today's Date: 05/16/2019   History of Present Illness  86 yo female with onset of hypoxia, hypercapnia and acute respiratory failure was admitted, may have been a medication reaction.  Cleared for PE, Covid (-).  PMHx:  thyroid disease, COPD, HTN, atherosclerosis carotids, bronchitis, underwent cath on 9/29 and developed post procedure hypotension found to have pericardial effusion with concern for tampanade due to coronary bleed.  Underwent emergent sternotomy, and evacuation of pericardial effusion.  Extubated 10/1.  Clinical Impression  Patient presents with limited mobility due to pain, decreased cardiorespiratory endurance, decreased balance and decreased strength.  Currently mod to max A for EOB activity this pm.  She is amazingly resilient and her daughter in the room reports all the family work, but will plan to assist initially upon d/c.  Feel she will benefit from continued skilled PT in the acute setting and follow up CIR level rehab prior to d/c home with family support.      Follow Up Recommendations CIR    Equipment Recommendations  Rolling walker with 5" wheels    Recommendations for Other Services       Precautions / Restrictions Precautions Precautions: Sternal;Fall Precaution Comments: chest tube Restrictions Weight Bearing Restrictions: No      Mobility  Bed Mobility Overal bed mobility: Needs Assistance Bed Mobility: Rolling;Sidelying to Sit;Sit to Sidelying Rolling: Mod assist Sidelying to sit: Max assist     Sit to sidelying: Mod assist General bed mobility comments: assist for rolling with education on keeping pillow hugged to chest during mobility, side to sit assist for legs and trunk, sit to side assist for legs and +2 to scoot to [facility]  Transfers                 General transfer comment: NT due to pain/fatigue  Ambulation/Gait                 Stairs            Wheelchair Mobility    Modified Rankin (Stroke Patients Only)       Balance Overall balance assessment: Needs assistance Sitting-balance support: Feet unsupported Sitting balance-Leahy Scale: Fair Sitting balance - Comments: sitting EOB about 12 minutes with daughter in the room, washed her back, and VSS                                     Pertinent Vitals/Pain Pain Assessment: Faces Faces Pain Scale: Hurts whole lot Pain Location: chest and some headache in sitting Pain Descriptors / Indicators: Grimacing;Moaning Pain Intervention(s): Monitored during session;Repositioned;Limited activity within patient's tolerance(splinting)    Home Living Family/patient expects to be discharged to:: Private residence Living Arrangements: Alone Available Help at Discharge: Family;Available PRN/intermittently Type of Home: House Home Access: Ramped entrance     Home Layout: One level Home Equipment: Walker - 2 wheels Additional Comments: has been living alone, able to walk with no assist and no recent falls    Prior Function Level of Independence: Independent with assistive device(s)         Comments: mild SOB with longer walks     Hand Dominance        Extremity/Trunk Assessment   Upper Extremity Assessment Upper Extremity Assessment: RUE deficits/detail;LUE deficits/detail RUE Deficits / Details: AROM WFL within sternal precautions, some edema in UE's LUE Deficits / Details: AROM  WFL within sternal precautions, some edema in UE's    Lower Extremity Assessment Lower Extremity Assessment: Generalized weakness    Cervical / Trunk Assessment Cervical / Trunk Assessment: Kyphotic  Communication   Communication: No difficulties  Cognition Arousal/Alertness: Awake/alert Behavior During Therapy: WFL for tasks assessed/performed Overall Cognitive Status: Within Functional Limits for tasks assessed                                         General Comments General comments (skin integrity, edema, etc.): on 4L O2 SpO2 not reading while sitting, but 100% in supine, HR 112, BP 146/92    Exercises     Assessment/Plan    PT Assessment Patient needs continued PT services  PT Problem List Decreased strength;Decreased activity tolerance;Decreased mobility;Cardiopulmonary status limiting activity;Decreased balance;Decreased knowledge of use of DME;Pain       PT Treatment Interventions DME instruction;Gait training;Functional mobility training;Therapeutic activities;Therapeutic exercise;Balance training;Neuromuscular re-education;Patient/family education    PT Goals (Current goals can be found in the Care Plan section)  Acute Rehab PT Goals Patient Stated Goal: to get home and feel stronger PT Goal Formulation: With patient/family Time For Goal Achievement: 05/30/19 Potential to Achieve Goals: Good    Frequency Min 3X/week   Barriers to discharge        Co-evaluation               AM-PAC PT "6 Clicks" Mobility  Outcome Measure Help needed turning from your back to your side while in a flat bed without using bedrails?: A Lot Help needed moving from lying on your back to sitting on the side of a flat bed without using bedrails?: Total Help needed moving to and from a bed to a chair (including a wheelchair)?: Total Help needed standing up from a chair using your arms (e.g., wheelchair or bedside chair)?: Total Help needed to walk in hospital room?: Total Help needed climbing 3-5 steps with a railing? : Total 6 Click Score: 7    End of Session Equipment Utilized During Treatment: Oxygen Activity Tolerance: Patient limited by fatigue Patient left: in bed;with call bell/phone within reach;with family/visitor present Nurse Communication: Mobility status PT Visit Diagnosis: Other abnormalities of gait and mobility (R26.89);Muscle weakness (generalized) (M62.81)    Time: 6834-1962 PT Time  Calculation (min) (ACUTE ONLY): 25 min   Charges:   PT Evaluation $PT Re-evaluation: 1 Re-eval PT Treatments $Therapeutic Activity: 8-22 mins        Magda Kiel, [place] [phone] 05/16/2019   Reginia Naas 05/16/2019, 5:57 PM

## 2019-05-17 LAB — CBC
HCT: 34.6 % — ABNORMAL LOW (ref 36.0–46.0)
Hemoglobin: 11.2 g/dL — ABNORMAL LOW (ref 12.0–15.0)
MCH: 29.6 pg (ref 26.0–34.0)
MCHC: 32.4 g/dL (ref 30.0–36.0)
MCV: 91.3 fL (ref 80.0–100.0)
Platelets: 78 10*3/uL — ABNORMAL LOW (ref 150–400)
RBC: 3.79 MIL/uL — ABNORMAL LOW (ref 3.87–5.11)
RDW: 17.5 % — ABNORMAL HIGH (ref 11.5–15.5)
WBC: 18.4 10*3/uL — ABNORMAL HIGH (ref 4.0–10.5)
nRBC: 0 % (ref 0.0–0.2)

## 2019-05-17 LAB — BASIC METABOLIC PANEL
Anion gap: 10 (ref 5–15)
BUN: 29 mg/dL — ABNORMAL HIGH (ref 8–23)
CO2: 22 mmol/L (ref 22–32)
Calcium: 7.5 mg/dL — ABNORMAL LOW (ref 8.9–10.3)
Chloride: 108 mmol/L (ref 98–111)
Creatinine, Ser: 0.95 mg/dL (ref 0.44–1.00)
GFR calc Af Amer: >60 mL/min (ref >60)
GFR calc non Af Amer: 53 mL/min — ABNORMAL LOW (ref >60)
Glucose, Bld: 112 mg/dL — ABNORMAL HIGH (ref 70–99)
Potassium: 3.4 mmol/L — ABNORMAL LOW (ref 3.5–5.1)
Sodium: 140 mmol/L (ref 135–145)

## 2019-05-17 MED ORDER — METOPROLOL SUCCINATE ER 25 MG PO TB24
25.0000 mg | ORAL_TABLET | Freq: Every day | ORAL | Status: DC
  Administered 2019-05-17 – 2019-05-23 (×7): 25 mg via ORAL
  Filled 2019-05-17 (×7): qty 1

## 2019-05-17 MED ORDER — AMIODARONE LOAD VIA INFUSION
150.0000 mg | Freq: Once | INTRAVENOUS | Status: AC
  Administered 2019-05-17: 10:00:00 150 mg via INTRAVENOUS
  Filled 2019-05-17: qty 83.34

## 2019-05-17 MED ORDER — AMIODARONE HCL 200 MG PO TABS
400.0000 mg | ORAL_TABLET | Freq: Two times a day (BID) | ORAL | Status: DC
  Administered 2019-05-17: 10:00:00 400 mg via ORAL
  Filled 2019-05-17: qty 2

## 2019-05-17 MED ORDER — MOMETASONE FURO-FORMOTEROL FUM 200-5 MCG/ACT IN AERO
2.0000 | INHALATION_SPRAY | Freq: Two times a day (BID) | RESPIRATORY_TRACT | Status: DC
  Administered 2019-05-17 – 2019-05-31 (×27): 2 via RESPIRATORY_TRACT
  Filled 2019-05-17 (×2): qty 8.8

## 2019-05-17 MED ORDER — AMIODARONE HCL IN DEXTROSE 360-4.14 MG/200ML-% IV SOLN
60.0000 mg/h | INTRAVENOUS | Status: AC
  Administered 2019-05-17 (×2): 60 mg/h via INTRAVENOUS
  Filled 2019-05-17: qty 200

## 2019-05-17 MED ORDER — AMIODARONE HCL 200 MG PO TABS: 200.0000 mg | ORAL_TABLET | Freq: Every day | ORAL | Status: DC

## 2019-05-17 MED ORDER — FUROSEMIDE 10 MG/ML IJ SOLN
20.0000 mg | Freq: Once | INTRAMUSCULAR | Status: AC
  Administered 2019-05-17: 20 mg via INTRAVENOUS
  Filled 2019-05-17: qty 2

## 2019-05-17 MED ORDER — POTASSIUM CHLORIDE 10 MEQ/50ML IV SOLN
10.0000 meq | INTRAVENOUS | Status: AC
  Administered 2019-05-17 (×3): 10 meq via INTRAVENOUS
  Filled 2019-05-17 (×3): qty 50

## 2019-05-17 MED ORDER — FUROSEMIDE 10 MG/ML IJ SOLN: 20.0000 mg | Freq: Once | INTRAMUSCULAR | Status: DC

## 2019-05-17 MED ORDER — AMIODARONE HCL IN DEXTROSE 360-4.14 MG/200ML-% IV SOLN
30.0000 mg/h | INTRAVENOUS | Status: DC
  Filled 2019-05-17: qty 200

## 2019-05-17 MED ORDER — POTASSIUM CHLORIDE CRYS ER 20 MEQ PO TBCR
40.0000 meq | EXTENDED_RELEASE_TABLET | Freq: Once | ORAL | Status: AC
  Administered 2019-05-17: 10:00:00 40 meq via ORAL
  Filled 2019-05-17: qty 2

## 2019-05-17 MED ORDER — AMIODARONE HCL 200 MG PO TABS
400.0000 mg | ORAL_TABLET | Freq: Two times a day (BID) | ORAL | Status: DC
  Administered 2019-05-17 – 2019-05-22 (×10): 400 mg via ORAL
  Filled 2019-05-17 (×10): qty 2

## 2019-05-17 NOTE — Progress Notes (Signed)
Cardiology Progress Note  Patient ID: Amy Chambers MRN: [id number] DOB: 09/11/1927 Date of Encounter: 05/17/2019  Primary Cardiologist: Reatha Harps, MD  Subjective  In and out of atrial fibrillation overnight.  In normal sinus rhythm this morning.  180 cc output from chest tube over the last 24 hours.  Potassium low and being repleted.  She reports chest pain when she takes a deep breath.  Reports overall she feels good.  States she is swollen.  ROS:  All other ROS reviewed and negative. Pertinent positives noted in the HPI.     Inpatient Medications  Scheduled Meds: . amiodarone  400 mg Oral BID   Followed by  . [START ON 05/24/2019] amiodarone  200 mg Oral Daily  . aspirin  81 mg Oral Once  . atorvastatin  40 mg Oral q1800  . Chlorhexidine Gluconate Cloth  6 each Topical Daily  . furosemide  20 mg Intravenous Once  . guaiFENesin  600 mg Oral BID  . influenza vaccine adjuvanted  0.5 mL Intramuscular Tomorrow-1000  . levothyroxine  75 mcg Oral Daily  . loratadine  10 mg Oral Daily  . mouth rinse  15 mL Mouth Rinse BID  . mometasone-formoterol  2 puff Inhalation BID  . pantoprazole  40 mg Oral Q0600  . pneumococcal 23 valent vaccine  0.5 mL Intramuscular Tomorrow-1000  . polyethylene glycol  17 g Oral Daily  . potassium chloride  40 mEq Oral Once  . sodium chloride flush  10-40 mL Intracatheter Q12H  . sodium chloride flush  3 mL Intravenous Q12H  . sodium chloride flush  3 mL Intravenous Q12H  . sodium chloride flush  3 mL Intravenous Q12H  . ticagrelor  90 mg Oral BID   Continuous Infusions: . sodium chloride 10 mL/hr at 05/17/19 0800  . dexmedetomidine (PRECEDEX) IV infusion Stopped (05/16/19 0746)  . potassium chloride 10 mEq (05/17/19 0814)   PRN Meds: sodium chloride, acetaminophen, albuterol, morphine injection, ondansetron **OR** ondansetron (ZOFRAN) IV, oxyCODONE, sodium chloride flush, sodium chloride flush, traMADol, traZODone   Vital Signs   Vitals:   05/17/19 0500 05/17/19 0600 05/17/19 0746 05/17/19 0800  BP: (!) 116/52 (!) 83/38  103/70  Pulse: 85 74 77   Resp:   20   Temp:    97.8 F (36.6 C)  TempSrc:    Oral  SpO2: 98% 98% 98% 97%  Weight:  64.3 kg    Height:  5\' 1"  (1.549 m)      Intake/Output Summary (Last 24 hours) at 05/17/2019 0840 Last data filed at 05/17/2019 0800 Gross per 24 hour  Intake 1151.96 ml  Output 680 ml  Net 471.96 ml   Last 3 Weights 05/17/2019 05/14/2019 05/13/2019  Weight (lbs) 141 lb 11.2 oz 126 lb 1.7 oz 118 lb 6.4 oz  Weight (kg) 64.275 kg 57.2 kg 53.706 kg      Telemetry  Overnight telemetry shows intermittent atrial fibrillation that is paroxysmal, in sinus rhythm now with heart rate in the 80 range, which I personally reviewed.    Physical Exam   Vitals:   05/17/19 0500 05/17/19 0600 05/17/19 0746 05/17/19 0800  BP: (!) 116/52 (!) 83/38  103/70  Pulse: 85 74 77   Resp:   20   Temp:    97.8 F (36.6 C)  TempSrc:    Oral  SpO2: 98% 98% 98% 97%  Weight:  64.3 kg    Height:  5\' 1"  (1.549 m)  Intake/Output Summary (Last 24 hours) at 05/17/2019 0840 Last data filed at 05/17/2019 0800 Gross per 24 hour  Intake 1151.96 ml  Output 680 ml  Net 471.96 ml    Last 3 Weights 05/17/2019 05/14/2019 05/13/2019  Weight (lbs) 141 lb 11.2 oz 126 lb 1.7 oz 118 lb 6.4 oz  Weight (kg) 64.275 kg 57.2 kg 53.706 kg    Body mass index is 26.77 kg/m.  General: Well nourished, well developed, in no acute distress Head: Atraumatic, normal size  Eyes: PEERLA, EOMI  Neck: JVD noted 10 to 12 cm Endocrine: No thryomegaly Cardiac: Normal S1, S2; RRR no murmurs, rubs, or gallops Lungs: Diminished breath sounds bilaterally Abd: Soft, nontender, no hepatomegaly  Ext: Edema noted in the upper extremities, 1+ pitting edema Musculoskeletal: No deformities, BUE and BLE strength normal and equal Skin: Sternotomy scar noted, clean and dry Neuro: Alert and oriented to person, place, time, and situation, CNII-XII  grossly intact, no focal deficits  Psych: Normal mood and affect   Labs  High Sensitivity Troponin:   Recent Labs  Lab 05/10/19 1303 05/10/19 1527 05/10/19 1906 05/12/19 0653 05/12/19 0908  TROPONINIHS 44* 499* 975* 1,563* 1,288*     Cardiac EnzymesNo results for input(s): TROPONINI in the last 168 hours. No results for input(s): TROPIPOC in the last 168 hours.  Chemistry Recent Labs  Lab 05/10/19 1303  05/15/19 0643 05/16/19 0940 05/17/19 0350  NA 138   < > 140 144 140  K 4.3   < > 4.5 3.6 3.4*  CL 104   < > 112* 116* 108  CO2 23   < > 20* 18* 22  GLUCOSE 195*   < > 175* 108* 112*  BUN 27*   < > 28* 34* 29*  CREATININE 0.78   < > 0.89 1.08* 0.95  CALCIUM 8.6*   < > 6.5* 7.5* 7.5*  PROT 7.3  --   --   --   --   ALBUMIN 4.0  --   --   --   --   AST 38  --   --   --   --   ALT 32  --   --   --   --   ALKPHOS 65  --   --   --   --   BILITOT 0.5  --   --   --   --   GFRNONAA >60   < > 57* 45* 53*  GFRAA >60   < > >60 53* >60  ANIONGAP 11   < > 8 10 10    < > = values in this interval not displayed.    Hematology Recent Labs  Lab 05/15/19 0643 05/16/19 0940 05/17/19 0350  WBC 20.1* 17.6* 18.4*  RBC 4.17 4.04 3.79*  HGB 12.2 11.8* 11.2*  HCT 37.6 35.9* 34.6*  MCV 90.2 88.9 91.3  MCH 29.3 29.2 29.6  MCHC 32.4 32.9 32.4  RDW 16.6* 17.3* 17.5*  PLT 81* 64* 78*   BNP Recent Labs  Lab 05/10/19 1303 05/11/19 1437  BNP 211.0* 1,643.2*    DDimer No results for input(s): DDIMER in the last 168 hours.   Radiology  Dg Abd 1 View  Result Date: 05/15/2019 CLINICAL DATA:  Orogastric tube placement. EXAM: ABDOMEN - 1 VIEW COMPARISON:  07/02/2018 and chest x-ray 05/15/2019 FINDINGS: Evidence of patient's nasogastric tube with tip over the mid abdomen just right of midline likely over the distal stomach. Bowel gas pattern is nonobstructive. Remainder of the  exam is unchanged including 2 catheters overlying the heart. IMPRESSION: Nonobstructive bowel gas pattern. Enteric  tube with tip just right of midline in the mid abdomen likely over the distal stomach. Electronically Signed   By: Elberta Fortis M.D.   On: 05/15/2019 16:46   Dg Chest Port 1 View  Result Date: 05/16/2019 CLINICAL DATA:  ETT placement EXAM: PORTABLE CHEST 1 VIEW COMPARISON:  05/15/2019 FINDINGS: Endotracheal tube with the tip 3 cm above the carina. Nasogastric tube coursing below the diaphragm. Left-sided chest tube in unchanged position. Left jugular central venous catheter with the tip projecting over the SVC. Mediastinal drain in unchanged position. No focal consolidation, pleural effusion or pneumothorax. Mild left basilar atelectasis. Stable cardiomediastinal silhouette. Prior median sternotomy. IMPRESSION: 1. Support lines and tubing in satisfactory position. Electronically Signed   By: Elige Ko   On: 05/16/2019 08:07   Dg Chest Port 1 View  Result Date: 05/15/2019 CLINICAL DATA:  Central line placement EXAM: PORTABLE CHEST 1 VIEW COMPARISON:  05/15/2019, 5:23 a.m. FINDINGS: Interval placement of a left neck vascular catheter, tip projecting over the midportion of the SVC. Otherwise unchanged examination with endotracheal tube projecting above the carina, chest or mediastinal drainage tubes, and no acute abnormality of the lungs and no significant pneumothorax. IMPRESSION: 1. Interval placement of a left neck vascular catheter, tip projecting over the midportion of the SVC. 2. Otherwise unchanged examination with endotracheal tube projecting above the carina, chest or mediastinal drainage tubes, and no acute abnormality of the lungs and no significant pneumothorax. Electronically Signed   By: Lauralyn Primes M.D.   On: 05/15/2019 11:33    Cardiac Studies  TTE 10/1  1. Left ventricular ejection fraction, by visual estimation, is 55 to 60%. The left ventricle has normal function. Left ventricular septal wall thickness was mildly increased. There is mildly increased left ventricular hypertrophy.   2. Inferior basal hypokinesis but overal EF preserved.  3. Global right ventricle has normal systolic function.The right ventricular size is normal. No increase in right ventricular wall thickness.  4. Left atrial size was normal.  5. Right atrial size was mildly dilated.  6. Moderate calcification of the mitral valve leaflet(s).  7. Moderate mitral annular calcification.  8. Moderate thickening of the mitral valve leaflet(s).  9. The mitral valve is normal in structure. No evidence of mitral valve regurgitation. No evidence of mitral stenosis. 10. The tricuspid valve is normal in structure. Tricuspid valve regurgitation is mild. 11. The aortic valve is tricuspid Aortic valve regurgitation was not visualized by color flow Doppler. Mild to moderate aortic valve sclerosis/calcification without any evidence of aortic stenosis. 12. The pulmonic valve was not well visualized. Pulmonic valve regurgitation was not assessed by color flow Doppler. 13. Post pericardial window with no tamponade or residual effuson. 14. The interatrial septum was not well visualized.  Patient Profile  REYLYNN VANALSTINE is a 85 y.o. female with Jackquelyn Sundberg Mabeis a 85 y.o.femalewith history of COPD, hypertension, GERD who was admitted as a direct transfer on 9/25 for acute hypoxic respiratory failure secondary to COPD exacerbation versus pneumonia. She was subsequently found to have elevated cardiac enzymes concerning for non-STEMI.   She was taken to the cardiac catheterization laboratory on 9/29 and had intervention to her proximal right coronary artery.  Course has been complicated by coronary artery perforation, cardiac tamponade/hemopericardium.  She was taken to the operating room on 9/29 for pericardial washout.  Assessment & Plan  1.  Cardiogenic shock secondary to pericardial  tamponade -Cardiac cath on 9/29 with PCI to the proximal right RCA.  Postprocedure she developed acute hypotension with abdominal pain.  There was  concerns for a retroperitoneal bleed.  CT abdomen pelvis did not demonstrate RP bleed, but did demonstrate hemopericardium.  CT chest dissection protocol did not show an acute aortic dissection.  She was taken back to the cardiac catheterization laboratory and repeat coronary angiogram did not demonstrate any active coronary perforation.  Echocardiogram overnight demonstrated acute pericardial thrombus with cardiac tamponade, setting her clinical picture.  She was taken to the operating room for pericardial washout 9/29 -She is hemodynamically stable off pressors -She received a total of 3 units of packed red cells between 9/29-9/30 -Echocardiogram yesterday shows resolution of effusion and ejection fraction now 55 to 60% -Chest tube with 180 cc output, will defer timing of removal to CT surgery -She does appear swollen we will give 20 mg of IV Lasix today  2.  Acute systolic heart failure, ejection fraction 45 to 50% with wall motion abnormalities in the RCA distribution, now with recovery of function status post PCI -Initial echocardiogram admission with EF 45 to 50% with wall motion abnormalities in RCA distribution -Most recent echocardiogram with improvement in function to 55 to 60% without any wall motion -Overall this is good news that her EF has recovered after her PCI  3.  Non-STEMI status post PCI to proximal RCA -She was admitted to transfer from an outside hospital with elevated troponin (high-sensitivity around 1500) -Echocardiogram demonstrated wall motion abnormalities in the RCA territory with mildly reduced ejection fraction 45 to 50%. -She is status post PCI to the proximal RCA yesterday -We have rechallenged her aspirin Brilinta and she is tolerating this well -We will need to initiate a beta-blocker but will hold for now until she is out of ICU -She will continue with her high intensity statin  4.  Acute blood loss anemia in the setting of hemopericardium -Status post 3  units of packed red blood cells on 9/29 -Hemoglobin is now stable  5.  Thrombocytopenia -Likely related to recent surgery and overall critical illness -Continue to monitor  6.  Atrial fibrillation with rapid ventricular response -She developed A. fib after her procedure 9/29 -She has been in and out of A. fib but remains in normal sinus rhythm this morning -I have transitioned her to a p.o. amiodarone load that will be 400 mg twice daily x7 days and then 200 mg daily x21 days and we will stop.  I do not anticipate her to be on long-term amiodarone therapy and it is related to her critical illness. -We will not pursue anticoagulation due to high bleeding risk from recent sternotomy and we have rechallenged her on DAPT  7.  Hypertension -We will hold home medicines for now and restart once able on the floor if needed  8.  COPD exacerbation versus pneumonia -Was originally admitted for this purpose -She has completed 5 days of IV steroid therapy and we will stop this -We will complete a 5-day course of antibiotics and then stop this -Critical care medicine is recommended Dulera with albuterol as needed  9.  Hypothyroidism -Home Synthroid  FEN -No intravenous fluids -Salt restricted diet -Lines: left IJ central venous catheter -Code: DNR but okay with intubation -DVT: SCDs  CRITICAL CARE Performed by: [name]  Total critical care time: 45 minutes. Critical care time was exclusive of separately billable procedures and treating other patients. Critical care was necessary to treat  or prevent imminent or life-threatening deterioration. Critical care was time spent personally by me on the following activities: development of treatment plan with patient and/or surrogate as well as nursing, discussions with consultants, evaluation of patient's response to treatment, examination of patient, obtaining history from patient or surrogate, ordering and performing treatments and  interventions, ordering and review of laboratory studies, ordering and review of radiographic studies, pulse oximetry and re-evaluation of patient's condition.  For questions or updates, please contact [facility] Please consult [email or web address] for contact info under     Signed, Gerri Spore T. Flora Lipps, MD [place]  [facility]  05/17/2019 8:40 AM

## 2019-05-17 NOTE — Significant Event (Signed)
This may went back into atrial fibrillation today, and her amiodarone drip was restarted.  She has since converted back to normal sinus rhythm.  We will plan to restart her oral amiodarone load as planned today.  Her chest tube has been removed, and she is doing much better.  Blood pressure is great.  Family at the bedside.  Moving forward, a new week attending will resume her care.  However, I would prefer that she sees me in follow-up in 1 to 2 months.  Evalina Field, MD

## 2019-05-17 NOTE — Progress Notes (Signed)
NAME:  Amy Chambers, MRN:  [id number], DOB:  1928/04/07, LOS: 7 ADMISSION DATE:  05/10/2019, CONSULTATION DATE:  05/17/19 REFERRING MD: Cliffton Asters , CHIEF COMPLAINT:  Cardiac tamponade  Brief History   85 yo female admitted for acute respiratory failure and was found to have NSTEMI. PCI performed and was found to have 80% stenosis of mid RCA with DES placement. Acutely decompensated after returning to floor and was found to have cardiac tamponade requiring mediastinal washout with evacuated from pericardial space.  History of present illness   85 yo female who was transferred from [facility] long hospital for acute hypoxic respiratory failure. Pt was found to have NSTEMI and was taken to cath lab where 2 DES were placed to RCA. Patient developed acute decompensation where she was found to have hemopericardium. She was taken to OR by CT surg for mediastinal washout at which time was evacuated from the pericardial space.   Past Medical History  HTN CAD Hypothyroidism Hyperlipidemia  Consults:  PCCM Cardiology CT surgery  Procedures:  9/29 LHC 9/29 mediastinal washout for cardiac tamponade 9/30 Remains on pressor support 10/1 DC Precedex to facilitate pressor weaning. Extubated without event  Significant Diagnostic Tests:  9/29 LHC: 80% stenosis of prox RCA. LAD 20% stenosis. 40% stenosis of prox circumflex.   9/29 ECHO: EF 45-50%. Mild hypokinesis of inferior septum/wall. Impaired diastolic dysfunction. Pericardial effusion to right ventricle.  Micro Data:  9/25 COVID NEG 9/25 Blood cultures - Neg  Antimicrobials:  Rocephin 9/26>>9/30  Interim history/subjective:  Extubated yesterday. Sitting in chair.   Objective   Blood pressure (!) 83/38, pulse 77, temperature 97.9 F (36.6 C), temperature source Axillary, resp. rate 20, height 5\' 1"  (1.549 m), weight 64.3 kg, SpO2 98 %.    Vent Mode: PSV;CPAP FiO2 (%):  [40 %] 40 % PEEP:  [5 cmH20] 5 cmH20 Pressure Support:   [8 cmH20] 8 cmH20   Intake/Output Summary (Last 24 hours) at 05/17/2019 0802 Last data filed at 05/17/2019 0700 Gross per 24 hour  Intake 1071.96 ml  Output 680 ml  Net 391.96 ml   Filed Weights   05/13/19 0341 05/14/19 0127 05/17/19 0600  Weight: 53.7 kg 57.2 kg 64.3 kg   Physical Exam: General: Elderly, well-appearing, no acute distress HENT: [place], AT, ETT in place Eyes: EOMI, no scleral icterus Respiratory: Clear to auscultation bilaterally.  No crackles, wheezing or rales Cardiovascular: RRR, -M/R/G, no JVD GI: BS+, soft, nontender Extremities:-Edema,-tenderness Neuro: AAO x4, CNII-XII grossly intact Skin: Sternotomy incision, c/d/i Psych: Normal mood, normal affect GU: Foley in place  Assessment & Plan:  85 year old female admitted for NSTEMI s/p DES to RCA. Post-procedure developed cardiogenic shock secondary to hemopericardium with resultant tamponade. S/p pericardial window.  Cardiogenic shock secondary to pericardial tamponade  Off levophed Post-op and chest tube management per CTS and Cardiology  NSTEMI s/p DES to RCA c/b hemopericardium AFRVR Telemetry Continue DAPT per Cardiology Continue amio  Acute blood loss anemia s/p 3U PRBC No further transfusion needed. Goal Hg >8  Acute respiratory hypoxemic failure/insufficiency - resolved COPD exacerbation S/p abx and steroids x 5 days Wean supplemental oxygen for goal SpO2 88-92% DC scheduled Duonebs Restart Dulera. Transition to home bronchodilators at discharge  Pulmonary will sign off. Thank you for involving our team in your patient's care.  85, M.D. [facility] Pulmonary/Critical Care Medicine 05/17/2019 8:11 AM    Labs   CBC: Recent Labs  Lab 05/10/19 1303  05/14/19 1758  05/15/19 0200 05/15/19  0202 05/15/19 0220 05/15/19 0643 05/16/19 0940 05/17/19 0350  WBC 22.2*   < > 12.2*  --  18.7*  --   --  20.1* 17.6* 18.4*  NEUTROABS 12.6*  --   --   --   --   --   --   --   --   --   HGB  13.9   < > 10.5*   < > 8.7* 8.2* 8.2* 12.2 11.8* 11.2*  HCT 46.3*   < > 32.3*   < > 28.1* 24.0* 24.0* 37.6 35.9* 34.6*  MCV 98.7   < > 94.4  --  94.3  --   --  90.2 88.9 91.3  PLT 320   < > 306  --  52*  --   --  81* 64* 78*   < > = values in this interval not displayed.    Basic Metabolic Panel: Recent Labs  Lab 05/14/19 0328 05/14/19 1758  05/15/19 0026  05/15/19 0202 05/15/19 0220 05/15/19 0643 05/16/19 0940 05/17/19 0350  NA 139 135   < > 141   < > 145 145 140 144 140  K 4.2 4.5   < > 4.8   < > 3.5 3.3* 4.5 3.6 3.4*  CL 105 106   < > 109  --   --  108 112* 116* 108  CO2 27 21*  --   --   --   --   --  20* 18* 22  GLUCOSE 152* 275*   < > 163*  --   --  142* 175* 108* 112*  BUN 37* 28*   < > 29*  --   --  25* 28* 34* 29*  CREATININE 0.70 0.66   < > 0.50  --   --  0.60 0.89 1.08* 0.95  CALCIUM 8.2* 7.7*  --   --   --   --   --  6.5* 7.5* 7.5*   < > = values in this interval not displayed.   GFR: Estimated Creatinine Clearance: 34.5 mL/min (by C-G formula based on SCr of 0.95 mg/dL). Recent Labs  Lab 05/10/19 1527 05/10/19 1715  05/15/19 0200 05/15/19 0643 05/16/19 0940 05/17/19 0350  WBC  --   --    < > 18.7* 20.1* 17.6* 18.4*  LATICACIDVEN 1.7 1.9  --   --  1.9  --   --    < > = values in this interval not displayed.    Liver Function Tests: Recent Labs  Lab 05/10/19 1303  AST 38  ALT 32  ALKPHOS 65  BILITOT 0.5  PROT 7.3  ALBUMIN 4.0   No results for input(s): LIPASE, AMYLASE in the last 168 hours. No results for input(s): AMMONIA in the last 168 hours.  ABG    Component Value Date/Time   PHART 7.380 05/15/2019 0202   PCO2ART 37.9 05/15/2019 0202   PO2ART 240.0 (H) 05/15/2019 0202   HCO3 23.2 05/15/2019 0202   TCO2 22 05/15/2019 0220   ACIDBASEDEF 3.0 (H) 05/15/2019 0202   O2SAT 100.0 05/15/2019 0202     Coagulation Profile: Recent Labs  Lab 05/14/19 1758  INR 2.2*    Cardiac Enzymes: No results for input(s): CKTOTAL, CKMB, CKMBINDEX,  TROPONINI in the last 168 hours.  HbA1C: No results found for: HGBA1C  CBG: No results for input(s): GLUCAP in the last 168 hours.

## 2019-05-17 NOTE — Progress Notes (Signed)
Physical Therapy Treatment Patient Details Name: Amy Chambers MRN: [id number] DOB: [birth year] Today's Date: 05/17/2019    History of Present Illness 85 yo female with onset of hypoxia, hypercapnia and acute respiratory failure was admitted, may have been a medication reaction.  Cleared for PE, Covid (-).  PMHx:  thyroid disease, COPD, HTN, atherosclerosis carotids, bronchitis, underwent cath on 9/29 and developed post procedure hypotension found to have pericardial effusion with concern for tampanade due to coronary bleed.  Underwent emergent sternotomy, and evacuation of pericardial effusion.  Extubated 10/1.    PT Comments    Pt admitted with above diagnosis. Pt was able to ambulate with Carley Hammed walker with min assist of 2 for safety and cues.  Pt has trouble steering eva walker.  Pt progressing well but needs mod cues at times for safety and support.  Will need CIR.   Pt currently with functional limitations due to balance and endurance deficits. Pt will benefit from skilled PT to increase their independence and safety with mobility to allow discharge to the venue listed below.     Follow Up Recommendations  CIR     Equipment Recommendations  Rolling walker with 5" wheels    Recommendations for Other Services       Precautions / Restrictions Precautions Precautions: Sternal;Fall Precaution Comments: chest tube Restrictions Weight Bearing Restrictions: Yes(sternal)    Mobility  Bed Mobility Overal bed mobility: Needs Assistance Bed Mobility: Rolling;Sit to Sidelying Rolling: Mod assist Sidelying to sit: Max assist Supine to sit: Min guard   Sit to sidelying: Mod assist;+2 for physical assistance General bed mobility comments: assist for rolling with education on keeping pillow hugged to chest during mobility, sit to side assist for legs and trunk and +2 to scoot to [facility]  Transfers Overall transfer level: Needs assistance Equipment used: None(Eva walker) Transfers: Sit to/from  Stand Sit to Stand: Min guard;+2 safety/equipment         General transfer comment: Pt needed cues for hand placement on knees but good power up  Ambulation/Gait Ambulation/Gait assistance: Min assist;+2 safety/equipment Gait Distance (Feet): 90 Feet Assistive device: (Eva walker) Gait Pattern/deviations: Step-through pattern;Decreased step length - right;Decreased step length - left;Drifts right/left Gait velocity: reduced Gait velocity interpretation: <1.31 ft/sec, indicative of household ambulator General Gait Details: Pt able to ambulate with Carley Hammed walker with min assist and mod cues for stability with assist to steer Carley Hammed walker as it drifted right and left. Cues also to stand tall and to stay close to EVa walker as she fatigued.     Stairs             Wheelchair Mobility    Modified Rankin (Stroke Patients Only)       Balance Overall balance assessment: Needs assistance Sitting-balance support: No upper extremity supported;Feet supported Sitting balance-Leahy Scale: Fair Sitting balance - Comments: sitting EOB about 12 minutes with daughter in the room, washed her back, and VSS   Standing balance support: During functional activity;Bilateral upper extremity supported Standing balance-Leahy Scale: Poor Standing balance comment: relies on UE support                            Cognition Arousal/Alertness: Awake/alert Behavior During Therapy: WFL for tasks assessed/performed Overall Cognitive Status: Within Functional Limits for tasks assessed  Exercises General Exercises - Lower Extremity Ankle Circles/Pumps: AROM;Both;10 reps;Supine Long Arc Quad: AROM;Both;10 reps;Seated    General Comments General comments (skin integrity, edema, etc.): 96% on 2L at rest, needed 4LO2 with ambulation to keep sats 92%.  Other VSS      Pertinent Vitals/Pain Pain Assessment: Faces Faces Pain Scale: Hurts  whole lot Pain Location: chest and some headache in sitting Pain Descriptors / Indicators: Grimacing;Moaning Pain Intervention(s): Limited activity within patient's tolerance;Monitored during session;Premedicated before session;Repositioned    Home Living Family/patient expects to be discharged to:: Private residence Living Arrangements: Alone Available Help at Discharge: Family;Available PRN/intermittently Type of Home: House Home Access: Ramped entrance   Home Layout: One level Home Equipment: Walker - 2 wheels Additional Comments: has been living alone, able to walk with no assist and no recent falls    Prior Function Level of Independence: Independent with assistive device(s)      Comments: mild SOB with longer walks   PT Goals (current goals can now be found in the care plan section) Acute Rehab PT Goals Patient Stated Goal: to get home and feel stronger PT Goal Formulation: With patient/family Time For Goal Achievement: 05/30/19 Potential to Achieve Goals: Good Progress towards PT goals: Progressing toward goals    Frequency    Min 3X/week      PT Plan Current plan remains appropriate    Co-evaluation PT/OT/SLP Co-Evaluation/Treatment: Yes Reason for Co-Treatment: Complexity of the patient's impairments (multi-system involvement);For patient/therapist safety PT goals addressed during session: Mobility/safety with mobility OT goals addressed during session: Strengthening/ROM      AM-PAC PT "6 Clicks" Mobility   Outcome Measure  Help needed turning from your back to your side while in a flat bed without using bedrails?: A Lot Help needed moving from lying on your back to sitting on the side of a flat bed without using bedrails?: A Lot Help needed moving to and from a bed to a chair (including a wheelchair)?: A Lot Help needed standing up from a chair using your arms (e.g., wheelchair or bedside chair)?: A Little Help needed to walk in hospital room?: A  Little Help needed climbing 3-5 steps with a railing? : Total 6 Click Score: 13    End of Session Equipment Utilized During Treatment: Oxygen;Gait belt Activity Tolerance: Patient limited by fatigue Patient left: in bed;with call bell/phone within reach Nurse Communication: Mobility status;Precautions PT Visit Diagnosis: Other abnormalities of gait and mobility (R26.89);Muscle weakness (generalized) (M62.81)     Time: 6606-3016 PT Time Calculation (min) (ACUTE ONLY): 22 min  Charges:  $Gait Training: 8-22 mins                     [place] Pager:  [phone]  Office:  [place] 05/17/2019, 11:43 AM

## 2019-05-17 NOTE — TOC Progression Note (Signed)
Transition of Care [facility]) - Progression Note    Patient Details  Name: Amy Chambers MRN: [id number] Date of Birth: [birth year]  Transition of Care [facility]) CM/SW Contact  Graves-Bigelow, Ocie Cornfield, RN Phone Number: 05/17/2019, 12:20 PM  Clinical Narrative:  POD 2 s/p sternotomy, mediastinal washout. Pt was previously set up with [facility] Services. CIR for recommendations. CM will continue to follow for transition of care needs.    Expected Discharge Plan: [place] Barriers to Discharge: No Barriers Identified  Expected Discharge Plan and Services Expected Discharge Plan: [place] In-house Referral: NA Discharge Planning Services: CM Consult Post Acute Care Choice: [place] arrangements for the past 2 months: [place]: PT [place]: [place] ([facility]) Date HH Agency Contacted: 05/14/19 Time [place]: 1301 Representative spoke with at [place]: [place] ([place]) Interventions    Readmission Risk Interventions Readmission Risk Prevention Plan 05/14/2019  Transportation Screening Complete  Home Care Screening Complete  Medication Review (RN CM) Complete  Some recent data might be hidden

## 2019-05-17 NOTE — Progress Notes (Signed)
Patient ID: Amy Chambers, female   DOB: March 23, 1928, 85 y.o.   MRN: [id number] EVENING ROUNDS NOTE :     [address]       [name]                 3 Days Post-Op Procedure(s) (LRB): Mediastinal Exploration with Washout (N/A)  Total Length of Stay:  LOS: 7 days  BP 137/78   Pulse 79   Temp 98 F (36.7 C) (Oral)   Resp (!) 23   Ht 5\' 1"  (1.549 m)   Wt 64.3 kg   SpO2 99%   BMI 26.77 kg/m   .Intake/Output      10/01 0701 - 10/02 0700 10/02 0701 - 10/03 0700   P.O. 280 300   I.V. (mL/kg) 1141.9 (17.8) 325 (5.1)   NG/GT     IV Piggyback 261.4    Total Intake(mL/kg) 1683.3 (26.2) 625 (9.7)   Urine (mL/kg/hr) 500 (0.3) 0 (0)   Emesis/NG output     Chest Tube 180 70   Total Output 680 70   Net +1003.3 +555        Urine Occurrence  1 x     . sodium chloride 10 mL/hr at 05/17/19 0800  . dexmedetomidine (PRECEDEX) IV infusion Stopped (05/16/19 0746)     Lab Results  Component Value Date   WBC 18.4 (H) 05/17/2019   HGB 11.2 (L) 05/17/2019   HCT 34.6 (L) 05/17/2019   PLT 78 (L) 05/17/2019   GLUCOSE 112 (H) 05/17/2019   CHOL 204 (H) 08/30/2018   TRIG 140 08/30/2018   HDL 46 08/30/2018   LDLCALC 130 (H) 08/30/2018   ALT 32 05/10/2019   AST 38 05/10/2019   NA 140 05/17/2019   K 3.4 (L) 05/17/2019   CL 108 05/17/2019   CREATININE 0.95 05/17/2019   BUN 29 (H) 05/17/2019   CO2 22 05/17/2019   TSH 1.720 08/30/2018   INR 2.2 (H) 05/14/2019   afib today Walked in hall short distance   Grace Isaac MD  Beeper [phone] Office [phone] 05/17/2019 5:55 PM

## 2019-05-17 NOTE — Progress Notes (Signed)
SLP Cancellation Note  Patient Details Name: Amy Chambers MRN: [id number] DOB: 28-Sep-1927   Cancelled treatment:       Reason Eval/Treat Not Completed: Other (comment) Order received for swallow evaluation but per RN, order is no longer needed. Please re-order PRN.   Amy Chambers 05/17/2019, 9:26 AM  Amy Chambers, M.A. [place] Acute Environmental education officer [phone] Office [phone]

## 2019-05-17 NOTE — Progress Notes (Signed)
      [address]       [place] [address]             [phone]                 3 Days Post-Op Procedure(s) (LRB): Mediastinal Exploration with Washout (N/A)   Events: Remains on some levo _______________________________________________________________ Vitals: BP (!) 111/55   Pulse 92   Temp 97.8 F (36.6 C) (Oral)   Resp 20   Ht 5\' 1"  (1.549 m)   Wt 64.3 kg   SpO2 97%   BMI 26.77 kg/m   - Neuro: awake, follows commands  - Cardiovascular: sinus     - Pulm:  Vent Mode: PSV;CPAP FiO2 (%):  [40 %] 40 % PEEP:  [5 cmH20] 5 cmH20 Pressure Support:  [8 cmH20] 8 cmH20  ABG    Component Value Date/Time   PHART 7.380 05/15/2019 0202   PCO2ART 37.9 05/15/2019 0202   PO2ART 240.0 (H) 05/15/2019 0202   HCO3 23.2 05/15/2019 0202   TCO2 22 05/15/2019 0220   ACIDBASEDEF 3.0 (H) 05/15/2019 0202   O2SAT 100.0 05/15/2019 0202    - Abd: soft - Extremity: warm  .Intake/Output      10/01 0701 - 10/02 0700 10/02 0701 - 10/03 0700   P.O. 280    I.V. (mL/kg) 1141.9 (17.8) 80 (1.2)   NG/GT     IV Piggyback 261.4    Total Intake(mL/kg) 1683.3 (26.2) 80 (1.2)   Urine (mL/kg/hr) 500 (0.3)    Emesis/NG output     Chest Tube 180    Total Output 680    Net +1003.3 +80           _______________________________________________________________ Labs: CBC Latest Ref Rng & Units 05/17/2019 05/16/2019 05/15/2019  WBC 4.0 - 10.5 K/uL 18.4(H) 17.6(H) 20.1(H)  Hemoglobin 12.0 - 15.0 g/dL 11.2(L) 11.8(L) 12.2  Hematocrit 36.0 - 46.0 % 34.6(L) 35.9(L) 37.6  Platelets 150 - 400 K/uL 78(L) 64(L) 81(L)   CMP Latest Ref Rng & Units 05/17/2019 05/16/2019 05/15/2019  Glucose 70 - 99 mg/dL 112(H) 108(H) 175(H)  BUN 8 - 23 mg/dL 29(H) 34(H) 28(H)  Creatinine 0.44 - 1.00 mg/dL 0.95 1.08(H) 0.89  Sodium 135 - 145 mmol/L 140 144 140  Potassium 3.5 - 5.1 mmol/L 3.4(L) 3.6 4.5  Chloride 98 - 111 mmol/L 108 116(H) 112(H)  CO2 22 - 32 mmol/L 22 18(L) 20(L)  Calcium 8.9 -  10.3 mg/dL 7.5(L) 7.5(L) 6.5(L)  Total Protein 6.5 - 8.1 g/dL - - -  Total Bilirubin 0.3 - 1.2 mg/dL - - -  Alkaline Phos 38 - 126 U/L - - -  AST 15 - 41 U/L - - -  ALT 0 - 44 U/L - - -    CXR: -  _______________________________________________________________  Assessment and Plan: POD 2 s/p sternotomy, mediastinal washout  Neuro: pain controlled CV: will remove chest tubes Pulm: pulm toilet Renal: creat stable Dispo: per primary  Melodie Bouillon, MD 05/17/2019 11:04 AM

## 2019-05-17 NOTE — Progress Notes (Signed)
Rehab Admissions Coordinator Note:  Patient was screened by Cleatrice Burke for appropriateness for an Inpatient Acute Rehab Consult per PT recs. .  At this time, we are recommending Inpatient Rehab consult if you would like pt considered for admit. Please advise.  Cleatrice Burke RN MSN 05/17/2019, 8:06 AM  I can be reached at [phone].

## 2019-05-17 NOTE — Evaluation (Signed)
Occupational Therapy Re-evaluation Patient Details Name: Amy Chambers MRN: [id number] DOB: 30-May-1928 Today's Date: 05/17/2019    History of Present Illness 85 yo female with onset of hypoxia, hypercapnia and acute respiratory failure was admitted, may have been a medication reaction.  Cleared for PE, Covid (-).  PMHx:  thyroid disease, COPD, HTN, atherosclerosis carotids, bronchitis, underwent cath on 9/29 and developed post procedure hypotension found to have pericardial effusion with concern for tampanade due to coronary bleed.  Underwent emergent sternotomy, and evacuation of pericardial effusion.  Extubated 10/1.   Clinical Impression   This 85 yo female admitted with above presents to acute with now s/p emergent sternotomy thus affecting her independence with basic ADLs due to pain and sternal precautions. She will benefit from continued OT with follow up on CIR to get to a Mod I level to return home.    Follow Up Recommendations  CIR    Equipment Recommendations  Other (comment)(TBD next venue)       Precautions / Restrictions Precautions Precautions: Sternal;Fall Precaution Comments: chest tube Restrictions Weight Bearing Restrictions: Yes(sternal)      Mobility Bed Mobility Overal bed mobility: Needs Assistance Bed Mobility: Rolling;Sit to Sidelying Rolling: Mod assist Sidelying to sit: Max assist Supine to sit: Min guard   Sit to sidelying: Mod assist;+2 for physical assistance General bed mobility comments: assist for rolling with education on keeping pillow hugged to chest during mobility, sit to side assist for legs and trunk and +2 to scoot to [facility]  Transfers Overall transfer level: Needs assistance Equipment used: None(Eva walker) Transfers: Sit to/from Stand Sit to Stand: Min guard;+2 safety/equipment         General transfer comment: Pt needed cues for hand placement on knees but good power up    Balance Overall balance assessment: Needs  assistance Sitting-balance support: No upper extremity supported;Feet supported Sitting balance-Leahy Scale: Fair Sitting balance - Comments: sitting EOB about 5 minutes washed her hair with shampoo cap, and VSS   Standing balance support: Bilateral upper extremity supported Standing balance-Leahy Scale: Poor Standing balance comment: reliant on UE support with EVA walker                           ADL either performed or assessed with clinical judgement   ADL Overall ADL's : Needs assistance/impaired Eating/Feeding: Set up;Sitting   Grooming: Minimal assistance;Sitting   Upper Body Bathing: Minimal assistance;Sitting   Lower Body Bathing: Moderate assistance Lower Body Bathing Details (indicate cue type and reason): min A sit<>stand Upper Body Dressing : Minimal assistance;Sitting   Lower Body Dressing: Maximal assistance Lower Body Dressing Details (indicate cue type and reason): min A sit<>stand Toilet Transfer: Min guard;+2 for safety/equipment Toilet Transfer Details (indicate cue type and reason): Programmer, applications- Clothing Manipulation and Hygiene: Moderate assistance Toileting - Clothing Manipulation Details (indicate cue type and reason): min A sit<>stand       General ADL Comments: Change in ADL status due to new surgery and now with pain and sternal precautions     Vision Patient Visual Report: No change from baseline              Pertinent Vitals/Pain Pain Assessment: Faces Faces Pain Scale: Hurts whole lot Pain Location: chest when she layed down and some headache in sitting Pain Descriptors / Indicators: Grimacing;Moaning Pain Intervention(s): Limited activity within patient's tolerance;Monitored during session;Premedicated before session;Repositioned     Hand Dominance Right   Extremity/Trunk Assessment  Upper Extremity Assessment Upper Extremity Assessment: Generalized weakness RUE Deficits / Details: AROM WFL within sternal  precautions, some edema in UE's (mainly distally)           Communication Communication Communication: No difficulties   Cognition Arousal/Alertness: Awake/alert Behavior During Therapy: WFL for tasks assessed/performed Overall Cognitive Status: Within Functional Limits for tasks assessed                                     General Comments  96% on 2L at rest, needed 4LO2 with ambulation to keep sats 92%.  Other VSS            Home Living Family/patient expects to be discharged to:: Private residence Living Arrangements: Alone Available Help at Discharge: Family;Available PRN/intermittently Type of Home: House Home Access: Ramped entrance     Home Layout: One level     Bathroom Shower/Tub: Tub/shower unit         Home Equipment: Environmental consultant - 2 wheels   Additional Comments: has been living alone, able to walk with no assist and no recent falls      Prior Functioning/Environment Level of Independence: Independent with assistive device(s)        Comments: mild SOB with longer walks        OT Problem List: Decreased strength;Decreased range of motion;Impaired balance (sitting and/or standing);Pain;Decreased safety awareness      OT Treatment/Interventions: Self-care/ADL training;DME and/or AE instruction;Patient/family education;Balance training    OT Goals(Current goals can be found in the care plan section) Acute Rehab OT Goals Patient Stated Goal: to get home and feel stronger OT Goal Formulation: With patient Time For Goal Achievement: 05/31/19 Potential to Achieve Goals: Good  OT Frequency: Min 2X/week   Barriers to D/C: Decreased caregiver support          Co-evaluation PT/OT/SLP Co-Evaluation/Treatment: Yes Reason for Co-Treatment: Complexity of the patient's impairments (multi-system involvement);For patient/therapist safety PT goals addressed during session: Mobility/safety with mobility OT goals addressed during session:  Strengthening/ROM      AM-PAC OT "6 Clicks" Daily Activity     Outcome Measure Help from another person eating meals?: None Help from another person taking care of personal grooming?: A Little Help from another person toileting, which includes using toliet, bedpan, or urinal?: A Lot Help from another person bathing (including washing, rinsing, drying)?: A Lot Help from another person to put on and taking off regular upper body clothing?: A Lot Help from another person to put on and taking off regular lower body clothing?: Total 6 Click Score: 14   End of Session Equipment Utilized During Treatment: Rolling walker;Oxygen Nurse Communication: Mobility status  Activity Tolerance: Patient tolerated treatment well Patient left: in bed;with call bell/phone within reach;with bed alarm set  OT Visit Diagnosis: Unsteadiness on feet (R26.81);Other abnormalities of gait and mobility (R26.89);Muscle weakness (generalized) (M62.81);Pain Pain - part of body: (sternum area and back of head (pt reports this was pta))                Time: 8469-6295 OT Time Calculation (min): 21 min Charges:  OT General Charges $OT Visit: 1 Visit OT Evaluation $OT Re-eval: 1 Re-eval  [place] Circle, OTR/L Acute [employer] Pager [phone] Office [phone]   Almon Register 05/17/2019, 12:46 PM

## 2019-05-18 DIAGNOSIS — E782 Mixed hyperlipidemia: Secondary | ICD-10-CM

## 2019-05-18 DIAGNOSIS — I10 Essential (primary) hypertension: Secondary | ICD-10-CM

## 2019-05-18 LAB — BASIC METABOLIC PANEL
Anion gap: 6 (ref 5–15)
BUN: 24 mg/dL — ABNORMAL HIGH (ref 8–23)
CO2: 25 mmol/L (ref 22–32)
Calcium: 7.4 mg/dL — ABNORMAL LOW (ref 8.9–10.3)
Chloride: 107 mmol/L (ref 98–111)
Creatinine, Ser: 0.9 mg/dL (ref 0.44–1.00)
GFR calc Af Amer: >60 mL/min (ref >60)
GFR calc non Af Amer: 57 mL/min — ABNORMAL LOW (ref >60)
Glucose, Bld: 105 mg/dL — ABNORMAL HIGH (ref 70–99)
Potassium: 4 mmol/L (ref 3.5–5.1)
Sodium: 138 mmol/L (ref 135–145)

## 2019-05-18 LAB — CBC
HCT: 33.7 % — ABNORMAL LOW (ref 36.0–46.0)
Hemoglobin: 11.1 g/dL — ABNORMAL LOW (ref 12.0–15.0)
MCH: 30 pg (ref 26.0–34.0)
MCHC: 32.9 g/dL (ref 30.0–36.0)
MCV: 91.1 fL (ref 80.0–100.0)
Platelets: 108 10*3/uL — ABNORMAL LOW (ref 150–400)
RBC: 3.7 MIL/uL — ABNORMAL LOW (ref 3.87–5.11)
RDW: 17.6 % — ABNORMAL HIGH (ref 11.5–15.5)
WBC: 16.1 10*3/uL — ABNORMAL HIGH (ref 4.0–10.5)
nRBC: 0 % (ref 0.0–0.2)

## 2019-05-18 NOTE — Plan of Care (Signed)

## 2019-05-18 NOTE — Progress Notes (Addendum)
Patient ID: Amy Chambers, female   DOB: Nov 18, 1927, 85 y.o.   MRN: [id number] TCTS DAILY ICU PROGRESS NOTE                   [address]            [place] [address]          [phone]   4 Days Post-Op Procedure(s) (LRB): Mediastinal Exploration with Washout (N/A)  Total Length of Stay:  LOS: 8 days   Subjective: Alert sitting in chair eating breakfast  Objective: Vital signs in last 24 hours: Temp:  [97.8 F (36.6 C)-98.8 F (37.1 C)] 97.8 F (36.6 C) (10/03 0744) Pulse Rate:  [63-136] 125 (10/03 0739) Cardiac Rhythm: Atrial fibrillation (10/03 0733) Resp:  [8-36] 22 (10/03 0739) BP: (88-144)/(37-86) 140/86 (10/03 0739) SpO2:  [94 %-99 %] 96 % (10/03 0739) Weight:  [64.4 kg] 64.4 kg (10/03 0500)  Filed Weights   05/14/19 0127 05/17/19 0600 05/18/19 0500  Weight: 57.2 kg 64.3 kg 64.4 kg    Weight change: 0.125 kg   Hemodynamic parameters for last 24 hours:    Intake/Output from previous day: 10/02 0701 - 10/03 0700 In: 780 [P.O.:300; I.V.:480] Out: 530 [Urine:460; Chest Tube:70]  Intake/Output this shift: No intake/output data recorded.  Current Meds: Scheduled Meds: . amiodarone  400 mg Oral BID   Followed by  . [START ON 05/25/2019] amiodarone  200 mg Oral Daily  . aspirin  81 mg Oral Once  . atorvastatin  40 mg Oral q1800  . Chlorhexidine Gluconate Cloth  6 each Topical Daily  . guaiFENesin  600 mg Oral BID  . levothyroxine  75 mcg Oral Daily  . loratadine  10 mg Oral Daily  . mouth rinse  15 mL Mouth Rinse BID  . metoprolol succinate  25 mg Oral Daily  . mometasone-formoterol  2 puff Inhalation BID  . pantoprazole  40 mg Oral Q0600  . polyethylene glycol  17 g Oral Daily  . sodium chloride flush  10-40 mL Intracatheter Q12H  . sodium chloride flush  3 mL Intravenous Q12H  . sodium chloride flush  3 mL Intravenous Q12H  . sodium chloride flush  3 mL Intravenous Q12H  . ticagrelor  90 mg Oral BID   Continuous Infusions: .  sodium chloride Stopped (05/17/19 2000)  . dexmedetomidine (PRECEDEX) IV infusion Stopped (05/16/19 0746)   PRN Meds:.sodium chloride, acetaminophen, albuterol, morphine injection, ondansetron **OR** ondansetron (ZOFRAN) IV, oxyCODONE, sodium chloride flush, sodium chloride flush, traMADol, traZODone  General appearance: alert Neurologic: intact Heart: regular rate and rhythm, S1, S2 normal, no murmur, click, rub or gallop Wound: Sternum is stable  Lab Results: CBC: Recent Labs    05/17/19 0350 05/18/19 0402  WBC 18.4* 16.1*  HGB 11.2* 11.1*  HCT 34.6* 33.7*  PLT 78* 108*   BMET:  Recent Labs    05/17/19 0350 05/18/19 0402  NA 140 138  K 3.4* 4.0  CL 108 107  CO2 22 25  GLUCOSE 112* 105*  BUN 29* 24*  CREATININE 0.95 0.90  CALCIUM 7.5* 7.4*    CMET: Lab Results  Component Value Date   WBC 16.1 (H) 05/18/2019   HGB 11.1 (L) 05/18/2019   HCT 33.7 (L) 05/18/2019   PLT 108 (L) 05/18/2019   GLUCOSE 105 (H) 05/18/2019   CHOL 204 (H) 08/30/2018   TRIG 140 08/30/2018   HDL 46 08/30/2018   LDLCALC 130 (H) 08/30/2018   ALT 32  05/10/2019   AST 38 05/10/2019   NA 138 05/18/2019   K 4.0 05/18/2019   CL 107 05/18/2019   CREATININE 0.90 05/18/2019   BUN 24 (H) 05/18/2019   CO2 25 05/18/2019   TSH 1.720 08/30/2018   INR 2.2 (H) 05/14/2019      PT/INR: No results for input(s): LABPROT, INR in the last 72 hours. Radiology: No results found.   Assessment/Plan: S/P Procedure(s) (LRB): Mediastinal Exploration with Washout (N/A) Mobilize Stable from a surgical standpoint, progression per cardiology Sinus rhythm this morning   Grace Isaac 05/18/2019 8:25 AM

## 2019-05-18 NOTE — Progress Notes (Signed)
Progress Note  Patient Name: Amy Chambers Date of Encounter: 05/18/2019  Primary Cardiologist: Reatha Harps, MD  Subjective   Went back into afib so amiodarone was restarted.  She was unaware when she was in atrial fibrillation.  Her only complaint this morning is feeling weak and tired.  Inpatient Medications    Scheduled Meds: . amiodarone  400 mg Oral BID   Followed by  . [START ON 05/25/2019] amiodarone  200 mg Oral Daily  . aspirin  81 mg Oral Once  . atorvastatin  40 mg Oral q1800  . Chlorhexidine Gluconate Cloth  6 each Topical Daily  . guaiFENesin  600 mg Oral BID  . levothyroxine  75 mcg Oral Daily  . loratadine  10 mg Oral Daily  . mouth rinse  15 mL Mouth Rinse BID  . metoprolol succinate  25 mg Oral Daily  . mometasone-formoterol  2 puff Inhalation BID  . pantoprazole  40 mg Oral Q0600  . polyethylene glycol  17 g Oral Daily  . sodium chloride flush  10-40 mL Intracatheter Q12H  . sodium chloride flush  3 mL Intravenous Q12H  . sodium chloride flush  3 mL Intravenous Q12H  . sodium chloride flush  3 mL Intravenous Q12H  . ticagrelor  90 mg Oral BID   Continuous Infusions: . sodium chloride Stopped (05/17/19 2000)  . dexmedetomidine (PRECEDEX) IV infusion Stopped (05/16/19 0746)   PRN Meds: sodium chloride, acetaminophen, albuterol, morphine injection, ondansetron **OR** ondansetron (ZOFRAN) IV, oxyCODONE, sodium chloride flush, sodium chloride flush, traMADol, traZODone   Vital Signs    Vitals:   05/18/19 0733 05/18/19 0739 05/18/19 0744 05/18/19 0800  BP: 140/86 140/86  116/86  Pulse: (!) 112 (!) 125  (!) 125  Resp: (!) 28 (!) 22  (!) 22  Temp:   97.8 F (36.6 C)   TempSrc:   Oral   SpO2: 95% 96%  96%  Weight:      Height:        Intake/Output Summary (Last 24 hours) at 05/18/2019 0841 Last data filed at 05/18/2019 0400 Gross per 24 hour  Intake 600.03 ml  Output 490 ml  Net 110.03 ml   Last 3 Weights 05/18/2019 05/17/2019 05/14/2019   Weight (lbs) 141 lb 15.6 oz 141 lb 11.2 oz 126 lb 1.7 oz  Weight (kg) 64.4 kg 64.275 kg 57.2 kg      Telemetry    Currently sinus rhythm/sinus arrhythmia in the 60s.  Previously in atrial fibrillation in the 120s.  Postconversion pauses up to 3.46 seconds.- Personally Reviewed  ECG    05/17/19: Atrial fibrillation. Rate 143 bpm.  - Personally Reviewed  Physical Exam   VS:  BP 116/86   Pulse (!) 125   Temp 97.8 F (36.6 C) (Oral)   Resp (!) 22   Ht 5\' 1"  (1.549 m)   Wt 64.4 kg   SpO2 96%   BMI 26.83 kg/m  , BMI Body mass index is 26.83 kg/m. GENERAL:  Ill-appearing.  No acute distress HEENT: Pupils equal round and reactive, fundi not visualized, oral mucosa unremarkable NECK:  No jugular venous distention, waveform within normal limits, carotid upstroke brisk and symmetric, no bruits LUNGS:  Clear to auscultation bilaterally HEART:  RRR.  PMI not displaced or sustained,S1 and S2 within normal limits, no S3, no S4, no clicks, no rubs,  no murmurs CHEST: Midline incision C/D/I.  No erythema ABD:  Flat, positive bowel sounds normal in frequency in pitch, no  bruits, no rebound, no guarding, no midline pulsatile mass, no hepatomegaly, no splenomegaly EXT:  2 plus pulses throughout, no edema, no cyanosis no clubbing SKIN:  Large L arm ecchymosis NEURO:  Cranial nerves II through XII grossly intact, motor grossly intact throughout PSYCH:  Cognitively intact, oriented to person place and time   Labs    High Sensitivity Troponin:   Recent Labs  Lab 05/10/19 1303 05/10/19 1527 05/10/19 1906 05/12/19 0653 05/12/19 0908  TROPONINIHS 44* 499* 975* 1,563* 1,288*      Chemistry Recent Labs  Lab 05/16/19 0940 05/17/19 0350 05/18/19 0402  NA 144 140 138  K 3.6 3.4* 4.0  CL 116* 108 107  CO2 18* 22 25  GLUCOSE 108* 112* 105*  BUN 34* 29* 24*  CREATININE 1.08* 0.95 0.90  CALCIUM 7.5* 7.5* 7.4*  GFRNONAA 45* 53* 57*  GFRAA 53* >60 >60  ANIONGAP 10 10 6      Hematology  Recent Labs  Lab 05/16/19 0940 05/17/19 0350 05/18/19 0402  WBC 17.6* 18.4* 16.1*  RBC 4.04 3.79* 3.70*  HGB 11.8* 11.2* 11.1*  HCT 35.9* 34.6* 33.7*  MCV 88.9 91.3 91.1  MCH 29.2 29.6 30.0  MCHC 32.9 32.4 32.9  RDW 17.3* 17.5* 17.6*  PLT 64* 78* 108*    BNP Recent Labs  Lab 05/11/19 1437  BNP 1,643.2*     DDimer No results for input(s): DDIMER in the last 168 hours.   Radiology    No results found.  Cardiac Studies   Echo 05/16/19: 1. Left ventricular ejection fraction, by visual estimation, is 55 to 60%. The left ventricle has normal function. Left ventricular septal wall thickness was mildly increased. There is mildly increased left ventricular hypertrophy. 2. Inferior basal hypokinesis but overal EF preserved. 3. Global right ventricle has normal systolic function.The right ventricular size is normal. No increase in right ventricular wall thickness. 4. Left atrial size was normal. 5. Right atrial size was mildly dilated. 6. Moderate calcification of the mitral valve leaflet(s). 7. Moderate mitral annular calcification. 8. Moderate thickening of the mitral valve leaflet(s). 9. The mitral valve is normal in structure. No evidence of mitral valve regurgitation. No evidence of mitral stenosis. 10. The tricuspid valve is normal in structure. Tricuspid valve regurgitation is mild. 11. The aortic valve is tricuspid Aortic valve regurgitation was not visualized by color flow Doppler. Mild to moderate aortic valve sclerosis/calcification without any evidence of aortic stenosis. 12. The pulmonic valve was not well visualized. Pulmonic valve regurgitation was not assessed by color flow Doppler. 13. Post pericardial window with no tamponade or residual effuson. 14. The interatrial septum was not well visualized.  Patient Profile     85 y.o. female with COPD, hypertension, and GERD admitted with COPD exacerbation with hypoxic respiratory failure and pneumonia.  She  was then found to have NSTEMI and a left heart cath had a proximal RCA stenosis.  The intervention was complicated by perforation with tamponade and hemopericardium.  She was taken to the OR 9/29 for pericardial washout.  Course has been complicated by postoperative atrial fibrillation.  Assessment & Plan    # NSTEMI: High-sensitivity troponin was 1500.  She underwent PCI of the proximal RCA.  However this was complicated by perforation as above.  Continue aspirin, ticagrelor, atorvastatin, and metoprolol.   # Acute systolic and diastolic heart failure:  Initial echo revealed LVEF 45 to 50% with RCA region wall motion abnormalities.  This improved post PCI.  # Atrial fibrillation with RVR: She  had atrial fibrillation post operatively and off and on since then.  Now on oral amiodarone load.  She is currently in sinus rhythm.  She does have long postconversion pauses up to 3.46 seconds.  So far she has been asymptomatic.  Would not further titrate her nodal agents.  # COPD/Hypoxic respiratory failure: She is currently on 1 L supplemental oxygen.  She does not use oxygen at home.  Completed 5 days of steroids/antibiotics.  Dulera with prn albuterol as recommended by critical care  # Acute blood loss anemia: Hemopericardium 2/2 coronary perforation.  She received 3u pRBB on 9/29.  H/h remains stable.   # Thrombocytopenia: Likely related to surgery and critical illness.  # Essential hypertension: Home benazepril is on hold.   Time spent: 35 minutes-Greater than 50% of this time was spent in counseling, explanation of diagnosis, planning of further management, and coordination of care.      For questions or updates, please contact [facility] Please consult [email or web address] for contact info under        Signed, Skeet Latch, MD  05/18/2019, 8:41 AM

## 2019-05-19 DIAGNOSIS — I495 Sick sinus syndrome: Secondary | ICD-10-CM

## 2019-05-19 LAB — CBC
HCT: 29.5 % — ABNORMAL LOW (ref 36.0–46.0)
Hemoglobin: 9.5 g/dL — ABNORMAL LOW (ref 12.0–15.0)
MCH: 29.8 pg (ref 26.0–34.0)
MCHC: 32.2 g/dL (ref 30.0–36.0)
MCV: 92.5 fL (ref 80.0–100.0)
Platelets: 121 10*3/uL — ABNORMAL LOW (ref 150–400)
RBC: 3.19 MIL/uL — ABNORMAL LOW (ref 3.87–5.11)
RDW: 17 % — ABNORMAL HIGH (ref 11.5–15.5)
WBC: 10.6 10*3/uL — ABNORMAL HIGH (ref 4.0–10.5)
nRBC: 0 % (ref 0.0–0.2)

## 2019-05-19 LAB — BASIC METABOLIC PANEL
Anion gap: 8 (ref 5–15)
BUN: 21 mg/dL (ref 8–23)
CO2: 26 mmol/L (ref 22–32)
Calcium: 7.9 mg/dL — ABNORMAL LOW (ref 8.9–10.3)
Chloride: 105 mmol/L (ref 98–111)
Creatinine, Ser: 0.78 mg/dL (ref 0.44–1.00)
GFR calc Af Amer: >60 mL/min (ref >60)
GFR calc non Af Amer: >60 mL/min (ref >60)
Glucose, Bld: 121 mg/dL — ABNORMAL HIGH (ref 70–99)
Potassium: 3.5 mmol/L (ref 3.5–5.1)
Sodium: 139 mmol/L (ref 135–145)

## 2019-05-19 LAB — HEPARIN LEVEL (UNFRACTIONATED): Heparin Unfractionated: <0.1 IU/mL — ABNORMAL LOW (ref 0.30–0.70)

## 2019-05-19 MED ORDER — HEPARIN (PORCINE) 25000 UT/250ML-% IV SOLN
800.0000 [IU]/h | INTRAVENOUS | Status: AC
  Administered 2019-05-19: 11:00:00 500 [IU]/h via INTRAVENOUS
  Administered 2019-05-20: 22:00:00 800 [IU]/h via INTRAVENOUS
  Administered 2019-05-22: 750 [IU]/h via INTRAVENOUS
  Filled 2019-05-19 (×3): qty 250

## 2019-05-19 MED ORDER — POTASSIUM CHLORIDE CRYS ER 20 MEQ PO TBCR
20.0000 meq | EXTENDED_RELEASE_TABLET | ORAL | Status: AC
  Administered 2019-05-19 (×3): 20 meq via ORAL
  Filled 2019-05-19 (×3): qty 1

## 2019-05-19 NOTE — Progress Notes (Signed)
Patient ID: Amy Chambers, female   DOB: 1928-07-18, 85 y.o.   MRN: [id number] TCTS DAILY ICU PROGRESS NOTE                   [address]            [place] [address]          [phone]   5 Days Post-Op Procedure(s) (LRB): Mediastinal Exploration with Washout (N/A)  Total Length of Stay:  LOS: 9 days   Subjective: Walked some ysterday  Objective: Vital signs in last 24 hours: Temp:  [97.8 F (36.6 C)-98.9 F (37.2 C)] 97.8 F (36.6 C) (10/04 0758) Pulse Rate:  [40-118] 84 (10/04 0744) Cardiac Rhythm: Atrial fibrillation (10/04 0615) Resp:  [18-28] 23 (10/04 0744) BP: (83-140)/(44-88) 140/60 (10/04 0600) SpO2:  [88 %-100 %] 92 % (10/04 0744) FiO2 (%):  [98 %] 98 % (10/03 1930) Weight:  [63.5 kg] 63.5 kg (10/04 0500)  Filed Weights   05/17/19 0600 05/18/19 0500 05/19/19 0500  Weight: 64.3 kg 64.4 kg 63.5 kg    Weight change: -0.9 kg   Hemodynamic parameters for last 24 hours:    Intake/Output from previous day: 10/03 0701 - 10/04 0700 In: 120 [P.O.:110; I.V.:10] Out: 175 [Urine:175]  Intake/Output this shift: No intake/output data recorded.  Current Meds: Scheduled Meds: . amiodarone  400 mg Oral BID   Followed by  . [START ON 05/25/2019] amiodarone  200 mg Oral Daily  . aspirin  81 mg Oral Once  . atorvastatin  40 mg Oral q1800  . Chlorhexidine Gluconate Cloth  6 each Topical Daily  . guaiFENesin  600 mg Oral BID  . levothyroxine  75 mcg Oral Daily  . loratadine  10 mg Oral Daily  . mouth rinse  15 mL Mouth Rinse BID  . metoprolol succinate  25 mg Oral Daily  . mometasone-formoterol  2 puff Inhalation BID  . pantoprazole  40 mg Oral Q0600  . polyethylene glycol  17 g Oral Daily  . potassium chloride  20 mEq Oral Q4H  . sodium chloride flush  10-40 mL Intracatheter Q12H  . sodium chloride flush  3 mL Intravenous Q12H  . sodium chloride flush  3 mL Intravenous Q12H  . sodium chloride flush  3 mL Intravenous Q12H  . ticagrelor  90 mg  Oral BID   Continuous Infusions: . sodium chloride Stopped (05/17/19 2000)  . dexmedetomidine (PRECEDEX) IV infusion Stopped (05/16/19 0746)   PRN Meds:.sodium chloride, acetaminophen, albuterol, morphine injection, ondansetron **OR** ondansetron (ZOFRAN) IV, oxyCODONE, sodium chloride flush, sodium chloride flush, traMADol, traZODone  General appearance: alert Neurologic: intact Heart: regular rate and rhythm, S1, S2 normal, no murmur, click, rub or gallop Wound: Sternum is stable  Lab Results: CBC: Recent Labs    05/18/19 0402 05/19/19 0434  WBC 16.1* 10.6*  HGB 11.1* 9.5*  HCT 33.7* 29.5*  PLT 108* 121*   BMET:  Recent Labs    05/18/19 0402 05/19/19 0434  NA 138 139  K 4.0 3.5  CL 107 105  CO2 25 26  GLUCOSE 105* 121*  BUN 24* 21  CREATININE 0.90 0.78  CALCIUM 7.4* 7.9*    CMET: Lab Results  Component Value Date   WBC 10.6 (H) 05/19/2019   HGB 9.5 (L) 05/19/2019   HCT 29.5 (L) 05/19/2019   PLT 121 (L) 05/19/2019   GLUCOSE 121 (H) 05/19/2019   CHOL 204 (H) 08/30/2018   TRIG 140 08/30/2018  HDL 46 08/30/2018   LDLCALC 130 (H) 08/30/2018   ALT 32 05/10/2019   AST 38 05/10/2019   NA 139 05/19/2019   K 3.5 05/19/2019   CL 105 05/19/2019   CREATININE 0.78 05/19/2019   BUN 21 05/19/2019   CO2 26 05/19/2019   TSH 1.720 08/30/2018   INR 2.2 (H) 05/14/2019      PT/INR: No results for input(s): LABPROT, INR in the last 72 hours. Radiology: No results found.   Assessment/Plan: S/P Procedure(s) (LRB): Mediastinal Exploration with Washout (N/A) Mobilize Stable from a surgical standpoint, progression per cardiology A Fib  rhythm this morning   Grace Isaac 05/19/2019 8:23 AMPatient ID: Amy Chambers, female   DOB: [birth year], 85 y.o.   MRN: [id number]

## 2019-05-19 NOTE — Progress Notes (Signed)
Pt noted to feel nauseated. zofran given by nurse Adonis Huguenin. Daughter expressed concern regarding pt stomach stating it looked a little distended. Evaluated abdomen, active bowel sound, soft, slighlty distended.pt states she had a bowel movement 05/18/2019, slightly runny and dark in color. Noted to pt I will address with the evening rounding physician. VSS will continue to monitor.

## 2019-05-19 NOTE — Progress Notes (Signed)
Pt's K this AM 3.4, replacement orders initiated. Of note, H&H dropped from 11.1/33.7% to 9.5/29.5% in 24 hours. UOP for night shift 175cc so far (comparable to prior shifts, plus pt has had poor PO intake). No obvious bleeding noted on assessment. VSS, pt mentating fine.   Will continue to monitor and pass info to day shift.

## 2019-05-19 NOTE — Progress Notes (Signed)
Progress Note  Patient Name: Amy Chambers Date of Encounter: 05/19/2019  Primary Cardiologist: Reatha Harps, MD  Subjective   Feeling very tired.  She had a lot of shortness of breath when walking to the bedside commode and back.  No chest pain.  She notes some lightheadedness whenever she tries to stand.  Inpatient Medications    Scheduled Meds:  amiodarone  400 mg Oral BID   Followed by   Melene Muller ON 05/25/2019] amiodarone  200 mg Oral Daily   aspirin  81 mg Oral Once   atorvastatin  40 mg Oral q1800   Chlorhexidine Gluconate Cloth  6 each Topical Daily   guaiFENesin  600 mg Oral BID   levothyroxine  75 mcg Oral Daily   loratadine  10 mg Oral Daily   mouth rinse  15 mL Mouth Rinse BID   metoprolol succinate  25 mg Oral Daily   mometasone-formoterol  2 puff Inhalation BID   pantoprazole  40 mg Oral Q0600   polyethylene glycol  17 g Oral Daily   potassium chloride  20 mEq Oral Q4H   sodium chloride flush  10-40 mL Intracatheter Q12H   sodium chloride flush  3 mL Intravenous Q12H   sodium chloride flush  3 mL Intravenous Q12H   sodium chloride flush  3 mL Intravenous Q12H   ticagrelor  90 mg Oral BID   Continuous Infusions:  sodium chloride Stopped (05/17/19 2000)   dexmedetomidine (PRECEDEX) IV infusion Stopped (05/16/19 0746)   PRN Meds: sodium chloride, acetaminophen, albuterol, morphine injection, ondansetron **OR** ondansetron (ZOFRAN) IV, oxyCODONE, sodium chloride flush, sodium chloride flush, traMADol, traZODone   Vital Signs    Vitals:   05/19/19 0615 05/19/19 0700 05/19/19 0744 05/19/19 0758  BP:      Pulse: (!) 118 (!) 105 84   Resp: (!) 23 (!) 22 (!) 23   Temp:    97.8 F (36.6 C)  TempSrc:    Oral  SpO2: 92% 94% 92%   Weight:      Height:        Intake/Output Summary (Last 24 hours) at 05/19/2019 0835 Last data filed at 05/19/2019 0500 Gross per 24 hour  Intake 120 ml  Output 175 ml  Net -55 ml   Last 3 Weights  05/19/2019 05/18/2019 05/17/2019  Weight (lbs) 139 lb 15.9 oz 141 lb 15.6 oz 141 lb 11.2 oz  Weight (kg) 63.5 kg 64.4 kg 64.275 kg      Telemetry    Currently sinus rhythm/sinus arrhythmia in the 60s.  Previously in atrial fibrillation in the 120s.  Postconversion pauses up to 3.46 seconds.- Personally Reviewed  ECG    05/17/19: Atrial fibrillation. Rate 143 bpm.  - Personally Reviewed  Physical Exam   VS:  BP 140/60    Pulse 84    Temp 97.8 F (36.6 C) (Oral)    Resp (!) 23    Ht  (1.549 m)    Wt 63.5 kg    SpO2 92%    BMI 26.45 kg/m  , BMI Body mass index is 26.45 kg/m. GENERAL:  Ill-appearing.  No acute distress HEENT: Pupils equal round and reactive, fundi not visualized, oral mucosa unremarkable NECK:  No jugular venous distention, waveform within normal limits, carotid upstroke brisk and symmetric, no bruits LUNGS:  Clear to auscultation bilaterally HEART:  RRR.  PMI not displaced or sustained,S1 and S2 within normal limits, no S3, no S4, no clicks, no rubs,  no murmurs  CHEST: Midline incision C/D/I.  No erythema ABD:  Flat, positive bowel sounds normal in frequency in pitch, no bruits, no rebound, no guarding, no midline pulsatile mass, no hepatomegaly, no splenomegaly EXT:  2 plus pulses throughout, no edema, no cyanosis no clubbing SKIN:  Large L arm ecchymosis NEURO:  Cranial nerves II through XII grossly intact, motor grossly intact throughout PSYCH:  Cognitively intact, oriented to person place and time   Labs    High Sensitivity Troponin:   Recent Labs  Lab 05/10/19 1303 05/10/19 1527 05/10/19 1906 05/12/19 0653 05/12/19 0908  TROPONINIHS 44* 499* 975* 1,563* 1,288*      Chemistry Recent Labs  Lab 05/17/19 0350 05/18/19 0402 05/19/19 0434  NA 140 138 139  K 3.4* 4.0 3.5  CL 108 107 105  CO2 GLUCOSE 112* 105* 121*  BUN 29* 24* 21  CREATININE 0.95 0.90 0.78  CALCIUM 7.5* 7.4* 7.9*  GFRNONAA 53* 57* >60  GFRAA >60 >60 >60  ANIONGAP Hematology Recent Labs  Lab 05/17/19 0350 05/18/19 0402 05/19/19 0434  WBC 18.4* 16.1* 10.6*  RBC 3.79* 3.70* 3.19*  HGB 11.2* 11.1* 9.5*  HCT 34.6* 33.7* 29.5*  MCV 91.3 91.1 92.5  MCH 29.6 30.0 29.8  MCHC 32.4 32.9 32.2  RDW 17.5* 17.6* 17.0*  PLT 78* 108* 121*    BNP No results for input(s): BNP, PROBNP in the last 168 hours.   DDimer No results for input(s): DDIMER in the last 168 hours.   Radiology    No results found.  Cardiac Studies   Echo 05/16/19: 1. Left ventricular ejection fraction, by visual estimation, is 55 to 60%. The left ventricle has normal function. Left ventricular septal wall thickness was mildly increased. There is mildly increased left ventricular hypertrophy. 2. Inferior basal hypokinesis but overal EF preserved. 3. Global right ventricle has normal systolic function.The right ventricular size is normal. No increase in right ventricular wall thickness. 4. Left atrial size was normal. 5. Right atrial size was mildly dilated. 6. Moderate calcification of the mitral valve leaflet(s). 7. Moderate mitral annular calcification. 8. Moderate thickening of the mitral valve leaflet(s). 9. The mitral valve is normal in structure. No evidence of mitral valve regurgitation. No evidence of mitral stenosis. 10. The tricuspid valve is normal in structure. Tricuspid valve regurgitation is mild. 11. The aortic valve is tricuspid Aortic valve regurgitation was not visualized by color flow Doppler. Mild to moderate aortic valve sclerosis/calcification without any evidence of aortic stenosis. 12. The pulmonic valve was not well visualized. Pulmonic valve regurgitation was not assessed by color flow Doppler. 13. Post pericardial window with no tamponade or residual effuson. 14. The interatrial septum was not well visualized.  Patient Profile     85 y.o. female with COPD, hypertension, and GERD admitted with COPD exacerbation with hypoxic  respiratory failure and pneumonia.  She was then found to have NSTEMI and a left heart cath had a proximal RCA stenosis.  The intervention was complicated by perforation with tamponade and hemopericardium.  She was taken to the OR 9/29 for pericardial washout.  Course has been complicated by postoperative atrial fibrillation.  Assessment & Plan    # NSTEMI: High-sensitivity troponin was 1500.  She underwent PCI of the proximal RCA.  However this was complicated by perforation as above.  Continue aspirin, ticagrelor, atorvastatin, and metoprolol.   # Acute systolic and diastolic heart failure: Initial echo revealed LVEF 45 to 50%  with RCA region wall motion abnormalities.  This improved post PCI.  # Atrial fibrillation with RVR: She had atrial fibrillation post operatively and off and on since then.  Now on oral amiodarone load.  She is currently in sinus rhythm.  She continues to have long postconversion pauses up to 3.7 seconds.  She reports orthostasis that does not exactly correlate with these postconversion pauses.  Would not further titrate her nodal agents.  We will ask EP to see for consideration of pacemaker.  # COPD/Hypoxic respiratory failure: She is now off supplemental oxygen. Completed 5 days of steroids/antibiotics.  Dulera with prn albuterol as recommended by critical care  # Acute blood loss anemia: Hemopericardium 2/2 coronary perforation.  She received 3u pRBB on 9/29.  H/h remains stable.   # Thrombocytopenia: Likely related to surgery and critical illness.  # Essential hypertension: Home benazepril is on hold.   Time spent: 2840minutes-Greater than 50% of this time was spent in counseling, explanation of diagnosis, planning of further management, and coordination of care.      For questions or updates, please contact [facility] Please consult [email or web address] for contact info under        Signed, Chilton Siiffany Cazenovia, MD  05/19/2019, 8:35 AM     Progress  Note  Patient Name: Amy Gamblesancy M Chambers Date of Encounter: 05/19/2019  Primary Cardiologist: Reatha HarpsWesley T O'Neal, MD  Subjective   Went back into afib so amiodarone was restarted.  She was unaware when she was in atrial fibrillation.  Her only complaint this morning is feeling weak and tired.  Inpatient Medications    Scheduled Meds:  amiodarone  400 mg Oral BID   Followed by   Melene Muller[START ON 05/25/2019] amiodarone  200 mg Oral Daily   aspirin  81 mg Oral Once   atorvastatin  40 mg Oral q1800   Chlorhexidine Gluconate Cloth  6 each Topical Daily   guaiFENesin  600 mg Oral BID   levothyroxine  75 mcg Oral Daily   loratadine  10 mg Oral Daily   mouth rinse  15 mL Mouth Rinse BID   metoprolol succinate  25 mg Oral Daily   mometasone-formoterol  2 puff Inhalation BID   pantoprazole  40 mg Oral Q0600   polyethylene glycol  17 g Oral Daily   potassium chloride  20 mEq Oral Q4H   sodium chloride flush  10-40 mL Intracatheter Q12H   sodium chloride flush  3 mL Intravenous Q12H   sodium chloride flush  3 mL Intravenous Q12H   sodium chloride flush  3 mL Intravenous Q12H   ticagrelor  90 mg Oral BID   Continuous Infusions:  sodium chloride Stopped (05/17/19 2000)   dexmedetomidine (PRECEDEX) IV infusion Stopped (05/16/19 0746)   PRN Meds: sodium chloride, acetaminophen, albuterol, morphine injection, ondansetron **OR** ondansetron (ZOFRAN) IV, oxyCODONE, sodium chloride flush, sodium chloride flush, traMADol, traZODone   Vital Signs    Vitals:   05/19/19 0615 05/19/19 0700 05/19/19 0744 05/19/19 0758  BP:      Pulse: (!) 118 (!) 105 84   Resp: (!) 23 (!) 22 (!) 23   Temp:    97.8 F (36.6 C)  TempSrc:    Oral  SpO2: 92% 94% 92%   Weight:      Height:        Intake/Output Summary (Last 24 hours) at 05/19/2019 0835 Last data filed at 05/19/2019 0500 Gross per 24 hour  Intake 120 ml  Output 175 ml  Net -55  ml   Last 3 Weights 05/19/2019 05/18/2019 05/17/2019  Weight  (lbs) 139 lb 15.9 oz 141 lb 15.6 oz 141 lb 11.2 oz  Weight (kg) 63.5 kg 64.4 kg 64.275 kg      Telemetry    Currently sinus rhythm/sinus arrhythmia in the 60s.  Previously in atrial fibrillation in the 120s.  Postconversion pauses up to 3.46 seconds.- Personally Reviewed  ECG    05/17/19: Atrial fibrillation. Rate 143 bpm.  - Personally Reviewed  Physical Exam   VS:  BP 140/60    Pulse 84    Temp 97.8 F (36.6 C) (Oral)    Resp (!) 23    Ht 5\' 1"  (1.549 m)    Wt 63.5 kg    SpO2 92%    BMI 26.45 kg/m  , BMI Body mass index is 26.45 kg/m. GENERAL:  Ill-appearing.  No acute distress HEENT: Pupils equal round and reactive, fundi not visualized, oral mucosa unremarkable NECK:  No jugular venous distention, waveform within normal limits, carotid upstroke brisk and symmetric, no bruits LUNGS:  Clear to auscultation bilaterally HEART:  RRR.  PMI not displaced or sustained,S1 and S2 within normal limits, no S3, no S4, no clicks, no rubs,  no murmurs CHEST: Midline incision C/D/I.  No erythema ABD:  Flat, positive bowel sounds normal in frequency in pitch, no bruits, no rebound, no guarding, no midline pulsatile mass, no hepatomegaly, no splenomegaly EXT:  2 plus pulses throughout, no edema, no cyanosis no clubbing SKIN:  Large L arm ecchymosis NEURO:  Cranial nerves II through XII grossly intact, motor grossly intact throughout PSYCH:  Cognitively intact, oriented to person place and time   Labs    High Sensitivity Troponin:   Recent Labs  Lab 05/10/19 1303 05/10/19 1527 05/10/19 1906 05/12/19 0653 05/12/19 0908  TROPONINIHS 44* 499* 975* 1,563* 1,288*      Chemistry Recent Labs  Lab 05/17/19 0350 05/18/19 0402 05/19/19 0434  NA 140 138 139  K 3.4* 4.0 3.5  CL 108 107 105  CO2 22 25 26   GLUCOSE 112* 105* 121*  BUN 29* 24* 21  CREATININE 0.95 0.90 0.78  CALCIUM 7.5* 7.4* 7.9*  GFRNONAA 53* 57* >60  GFRAA >60 >60 >60  ANIONGAP 10 6 8      Hematology Recent Labs   Lab 05/17/19 0350 05/18/19 0402 05/19/19 0434  WBC 18.4* 16.1* 10.6*  RBC 3.79* 3.70* 3.19*  HGB 11.2* 11.1* 9.5*  HCT 34.6* 33.7* 29.5*  MCV 91.3 91.1 92.5  MCH 29.6 30.0 29.8  MCHC 32.4 32.9 32.2  RDW 17.5* 17.6* 17.0*  PLT 78* 108* 121*    BNP No results for input(s): BNP, PROBNP in the last 168 hours.   DDimer No results for input(s): DDIMER in the last 168 hours.   Radiology    No results found.  Cardiac Studies   Echo 05/16/19: 1. Left ventricular ejection fraction, by visual estimation, is 55 to 60%. The left ventricle has normal function. Left ventricular septal wall thickness was mildly increased. There is mildly increased left ventricular hypertrophy. 2. Inferior basal hypokinesis but overal EF preserved. 3. Global right ventricle has normal systolic function.The right ventricular size is normal. No increase in right ventricular wall thickness. 4. Left atrial size was normal. 5. Right atrial size was mildly dilated. 6. Moderate calcification of the mitral valve leaflet(s). 7. Moderate mitral annular calcification. 8. Moderate thickening of the mitral valve leaflet(s). 9. The mitral valve is normal in structure. No evidence of mitral  valve regurgitation. No evidence of mitral stenosis. 10. The tricuspid valve is normal in structure. Tricuspid valve regurgitation is mild. 11. The aortic valve is tricuspid Aortic valve regurgitation was not visualized by color flow Doppler. Mild to moderate aortic valve sclerosis/calcification without any evidence of aortic stenosis. 12. The pulmonic valve was not well visualized. Pulmonic valve regurgitation was not assessed by color flow Doppler. 13. Post pericardial window with no tamponade or residual effuson. 14. The interatrial septum was not well visualized.  Patient Profile     85 y.o. female with COPD, hypertension, and GERD admitted with COPD exacerbation with hypoxic respiratory failure and pneumonia.  She was  then found to have NSTEMI and a left heart cath had a proximal RCA stenosis.  The intervention was complicated by perforation with tamponade and hemopericardium.  She was taken to the OR 9/29 for pericardial washout.  Course has been complicated by postoperative atrial fibrillation.  Assessment & Plan    # NSTEMI: High-sensitivity troponin was 1500.  She underwent PCI of the proximal RCA.  However this was complicated by perforation as above.  Continue aspirin, ticagrelor, atorvastatin, and metoprolol.   # Acute systolic and diastolic heart failure:  Initial echo revealed LVEF 45 to 50% with RCA region wall motion abnormalities.  This improved post PCI.  # Atrial fibrillation with RVR: She had atrial fibrillation post operatively and off and on since then.  Now on oral amiodarone load.  She is currently in sinus rhythm.  She does have long postconversion pauses up to 3.46 seconds.  So far she has been asymptomatic.  Would not further titrate her nodal agents. ?heparin   # COPD/Hypoxic respiratory failure: She is currently on 1 L supplemental oxygen.  She does not use oxygen at home.  Completed 5 days of steroids/antibiotics.  Dulera with prn albuterol as recommended by critical care  # Acute blood loss anemia: Hemopericardium 2/2 coronary perforation.  She received 3u pRBB on 9/29.  H/h remains stable.   # Thrombocytopenia: Likely related to surgery and critical illness.  # Essential hypertension: Home benazepril is on hold.   Time spent: 35 minutes-Greater than 50% of this time was spent in counseling, explanation of diagnosis, planning of further management, and coordination of care.      For questions or updates, please contact [place] Please consult [email or web address] for contact info under        Signed, Skeet Latch, MD  05/19/2019, 8:35 AM

## 2019-05-19 NOTE — Progress Notes (Addendum)
[place] for heparin Indication: atrial fibrillation  Allergies  Allergen Reactions  . Crestor [Rosuvastatin Calcium] Other (See Comments)    Cramping/pain in lower extremeties  . Niaspan [Niacin Er] Other (See Comments)    Cramping/pain in lower extremeties    Patient Measurements: Height: 5\' 1"  (154.9 cm) Weight: 139 lb 15.9 oz (63.5 kg) IBW/kg (Calculated) : 47.8   Vital Signs: Temp: 98.1 F (36.7 C) (10/04 1542) Temp Source: Oral (10/04 1542) BP: 141/92 (10/04 1700) Pulse Rate: 101 (10/04 1700)  Labs: Recent Labs    05/17/19 0350 05/18/19 0402 05/19/19 0434 05/19/19 1725  HGB 11.2* 11.1* 9.5*  --   HCT 34.6* 33.7* 29.5*  --   PLT 78* 108* 121*  --   HEPARINUNFRC  --   --   --  <0.10*  CREATININE 0.95 0.90 0.78  --     Estimated Creatinine Clearance: 40.7 mL/min (by C-G formula based on SCr of 0.78 mg/dL).   Medical History: Past Medical History:  Diagnosis Date  . Cataract   . COPD (chronic obstructive pulmonary disease) ([place])   . GERD (gastroesophageal reflux disease)   . Hypertension   . Osteopenia   . Thyroid disease     Assessment: 89yof s/p PCI to RCA complicated by hemopericardium > OR for washout and CABG x1 9/29.  Currently on DAPT for DES in RCA. Post op Afib noted and now on heparin. She is noted on brilinta -heparin level < 0.1 on 500 units/hr -hg= 9.5, plt= 121   Goal of Therapy:  Heparin level 0.3-0.5 Monitor platelets by anticoagulation protocol: Yes   Plan:  Increase heparin to 750 units/hr Heparin level in ~8 hours and daily wth CBC daily Will follow long term [facility] plans  Hildred Laser, PharmD Clinical Pharmacist **Pharmacist phone directory can now be found on [email or web address] ([email or web address]).  Listed under [place].

## 2019-05-19 NOTE — Progress Notes (Signed)
ANTICOAGULATION CONSULT NOTE - Initial Consult  Pharmacy Consult for heparin Indication: atrial fibrillation  Allergies  Allergen Reactions  . Crestor [Rosuvastatin Calcium] Other (See Comments)    Cramping/pain in lower extremeties  . Niaspan [Niacin Er] Other (See Comments)    Cramping/pain in lower extremeties    Patient Measurements: Height: 5\' 1"  (154.9 cm) Weight: 139 lb 15.9 oz (63.5 kg) IBW/kg (Calculated) : 47.8   Vital Signs: Temp: 97.8 F (36.6 C) (10/04 0758) Temp Source: Oral (10/04 0758) BP: 154/70 (10/04 0922) Pulse Rate: 72 (10/04 0922)  Labs: Recent Labs    05/17/19 0350 05/18/19 0402 05/19/19 0434  HGB 11.2* 11.1* 9.5*  HCT 34.6* 33.7* 29.5*  PLT 78* 108* 121*  CREATININE 0.95 0.90 0.78    Estimated Creatinine Clearance: 40.7 mL/min (by C-G formula based on SCr of 0.78 mg/dL).   Medical History: Past Medical History:  Diagnosis Date  . Cataract   . COPD (chronic obstructive pulmonary disease) ([place])   . GERD (gastroesophageal reflux disease)   . Hypertension   . Osteopenia   . Thyroid disease     Assessment: 89yof s/p PCI to RCA complicated by hemopericardium > OR for washout and CABG x1 9/29.  Currently on DAPT for DES in RCA no bleeding noted, h/h low stable, pltc 200>80s>120 with acute illness Post op Afib on amiodarone plan to start low dose heparin drip today Discuss long term AC plan on rounds tomorrow and need to switch ticagrelor to clopidogrel if AC to continue at DC.    Goal of Therapy:  Heparin level 0.3-0.5 Monitor platelets by anticoagulation protocol: Yes   Plan:  Heparin drip 500 uts/hr  Draw HL 6hr after start  Daily HL, CBC Discuss long term AC plan  Bonnita Nasuti Pharm.D. CPP, BCPS Clinical Pharmacist [phone] 05/19/2019 9:52 AM

## 2019-05-19 NOTE — Plan of Care (Signed)
Continues to have poor appetite. Deconditioned. Progressing mobility each day. Walked ~190 ft 10/3, said "that took it out of me."  Problem: Education: Goal: Knowledge of General Education information will improve Description: Including pain rating scale, medication(s)/side effects and non-pharmacologic comfort measures Outcome: Progressing   Problem: Health Behavior/Discharge Planning: Goal: Ability to manage health-related needs will improve Outcome: Progressing   Problem: Clinical Measurements: Goal: Ability to maintain clinical measurements within normal limits will improve Outcome: Progressing Goal: Will remain free from infection Outcome: Progressing Goal: Diagnostic test results will improve Outcome: Progressing Goal: Respiratory complications will improve Outcome: Progressing Goal: Cardiovascular complication will be avoided Outcome: Progressing   Problem: Activity: Goal: Risk for activity intolerance will decrease Outcome: Progressing   Problem: Nutrition: Goal: Adequate nutrition will be maintained Outcome: Progressing   Problem: Coping: Goal: Level of anxiety will decrease Outcome: Progressing   Problem: Elimination: Goal: Will not experience complications related to bowel motility Outcome: Progressing Goal: Will not experience complications related to urinary retention Outcome: Progressing   Problem: Pain Managment: Goal: General experience of comfort will improve Outcome: Progressing   Problem: Safety: Goal: Ability to remain free from injury will improve Outcome: Progressing   Problem: Skin Integrity: Goal: Risk for impaired skin integrity will decrease Outcome: Progressing   Problem: Education: Goal: Understanding of CV disease, CV risk reduction, and recovery process will improve Outcome: Progressing Goal: Individualized Educational Video(s) Outcome: Progressing   Problem: Activity: Goal: Ability to return to baseline activity level will  improve Outcome: Progressing   Problem: Cardiovascular: Goal: Ability to achieve and maintain adequate cardiovascular perfusion will improve Outcome: Progressing Goal: Vascular access site(s) Level 0-1 will be maintained Outcome: Progressing   Problem: Health Behavior/Discharge Planning: Goal: Ability to safely manage health-related needs after discharge will improve Outcome: Progressing

## 2019-05-20 LAB — BASIC METABOLIC PANEL
Anion gap: 5 (ref 5–15)
BUN: 17 mg/dL (ref 8–23)
CO2: 27 mmol/L (ref 22–32)
Calcium: 7.9 mg/dL — ABNORMAL LOW (ref 8.9–10.3)
Chloride: 105 mmol/L (ref 98–111)
Creatinine, Ser: 0.72 mg/dL (ref 0.44–1.00)
GFR calc Af Amer: >60 mL/min (ref >60)
GFR calc non Af Amer: >60 mL/min (ref >60)
Glucose, Bld: 103 mg/dL — ABNORMAL HIGH (ref 70–99)
Potassium: 4.2 mmol/L (ref 3.5–5.1)
Sodium: 137 mmol/L (ref 135–145)

## 2019-05-20 LAB — CBC
HCT: 30 % — ABNORMAL LOW (ref 36.0–46.0)
Hemoglobin: 9.6 g/dL — ABNORMAL LOW (ref 12.0–15.0)
MCH: 29.7 pg (ref 26.0–34.0)
MCHC: 32 g/dL (ref 30.0–36.0)
MCV: 92.9 fL (ref 80.0–100.0)
Platelets: 144 10*3/uL — ABNORMAL LOW (ref 150–400)
RBC: 3.23 MIL/uL — ABNORMAL LOW (ref 3.87–5.11)
RDW: 17.1 % — ABNORMAL HIGH (ref 11.5–15.5)
WBC: 9.7 10*3/uL (ref 4.0–10.5)
nRBC: 0 % (ref 0.0–0.2)

## 2019-05-20 LAB — HEPARIN LEVEL (UNFRACTIONATED)
Heparin Unfractionated: 0.19 IU/mL — ABNORMAL LOW (ref 0.30–0.70)
Heparin Unfractionated: 0.3 IU/mL (ref 0.30–0.70)

## 2019-05-20 MED ORDER — DIPHENHYDRAMINE-ZINC ACETATE 2-0.1 % EX CREA
TOPICAL_CREAM | Freq: Two times a day (BID) | CUTANEOUS | Status: DC | PRN
  Administered 2019-05-21 (×2): 1 via TOPICAL
  Administered 2019-05-23 – 2019-05-28 (×4): via TOPICAL
  Filled 2019-05-20 (×2): qty 28

## 2019-05-20 NOTE — Progress Notes (Signed)
[place] for heparin Indication: atrial fibrillation  Allergies  Allergen Reactions  . Crestor [Rosuvastatin Calcium] Other (See Comments)    Cramping/pain in lower extremeties  . Niaspan [Niacin Er] Other (See Comments)    Cramping/pain in lower extremeties    Patient Measurements: Height: 5\' 1"  (154.9 cm) Weight: 139 lb 15.9 oz (63.5 kg) IBW/kg (Calculated) : 47.8   Vital Signs: Temp: 97.8 F (36.6 C) (10/05 0350) Temp Source: Oral (10/05 0350) BP: 137/48 (10/05 0400) Pulse Rate: 60 (10/05 0400)  Labs: Recent Labs    05/18/19 0402 05/19/19 0434 05/19/19 1725 05/20/19 0326 05/20/19 0335  HGB 11.1* 9.5*  --  9.6*  --   HCT 33.7* 29.5*  --  30.0*  --   PLT 108* 121*  --  144*  --   HEPARINUNFRC  --   --  <0.10*  --  0.19*  CREATININE 0.90 0.78  --   --   --     Estimated Creatinine Clearance: 40.7 mL/min (by C-G formula based on SCr of 0.78 mg/dL).   Medical History: Past Medical History:  Diagnosis Date  . Cataract   . COPD (chronic obstructive pulmonary disease) ([place])   . GERD (gastroesophageal reflux disease)   . Hypertension   . Osteopenia   . Thyroid disease     Assessment: 89yof s/p PCI to RCA complicated by hemopericardium > OR for washout and CABG x1 9/29.  Currently on DAPT for DES in RCA. Post op Afib noted and now on heparin. She is noted on brilinta  Heparin level 0.19 units/ml.  No bleeding reported   Goal of Therapy:  Heparin level 0.3-0.5 Monitor platelets by anticoagulation protocol: Yes   Plan:  Increase heparin to 850 units/hr Will follow long term AC plans  Thanks for allowing pharmacy to be a part of this patient's care.  Excell Seltzer, PharmD Clinical Pharmacist

## 2019-05-20 NOTE — Progress Notes (Signed)
Progress Note  Patient Name: Amy Chambers M Rawles Date of Encounter: 05/20/2019  Primary Cardiologist: Reatha HarpsWesley T O'Neal, MD  Subjective   Still feeling tired.  She has lightheadedness when she stands and walks.  Inpatient Medications    Scheduled Meds:  amiodarone  400 mg Oral BID   Followed by   Melene Muller[START ON 05/25/2019] amiodarone  200 mg Oral Daily   aspirin  81 mg Oral Once   atorvastatin  40 mg Oral q1800   Chlorhexidine Gluconate Cloth  6 each Topical Daily   guaiFENesin  600 mg Oral BID   levothyroxine  75 mcg Oral Daily   loratadine  10 mg Oral Daily   mouth rinse  15 mL Mouth Rinse BID   metoprolol succinate  25 mg Oral Daily   mometasone-formoterol  2 puff Inhalation BID   pantoprazole  40 mg Oral Q0600   polyethylene glycol  17 g Oral Daily   sodium chloride flush  10-40 mL Intracatheter Q12H   sodium chloride flush  3 mL Intravenous Q12H   sodium chloride flush  3 mL Intravenous Q12H   sodium chloride flush  3 mL Intravenous Q12H   ticagrelor  90 mg Oral BID   Continuous Infusions:  sodium chloride Stopped (05/17/19 2000)   dexmedetomidine (PRECEDEX) IV infusion Stopped (05/16/19 0746)   heparin 750 Units/hr (05/20/19 0600)   PRN Meds: sodium chloride, acetaminophen, albuterol, morphine injection, ondansetron **OR** ondansetron (ZOFRAN) IV, oxyCODONE, sodium chloride flush, sodium chloride flush, traMADol, traZODone   Vital Signs    Vitals:   05/20/19 0350 05/20/19 0400 05/20/19 0500 05/20/19 0600  BP:  (!) 137/48 (!) 147/53 131/90  Pulse:  60 64 (!) 104  Resp:  19 (!) 21 (!) 21  Temp: 97.8 F (36.6 C)     TempSrc: Oral     SpO2:  100% 100% 98%  Weight:   63.2 kg   Height:        Intake/Output Summary (Last 24 hours) at 05/20/2019 0737 Last data filed at 05/20/2019 0600 Gross per 24 hour  Intake 211.3 ml  Output 600 ml  Net -388.7 ml   Last 3 Weights 05/20/2019 05/19/2019 05/18/2019  Weight (lbs) 139 lb 5.3 oz 139 lb 15.9 oz 141 lb  15.6 oz  Weight (kg) 63.2 kg 63.5 kg 64.4 kg      Telemetry   In and out of atrial fibrillation.  Post-conversion pauses. - Personally Reviewed  ECG    05/17/19: Atrial fibrillation. Rate 143 bpm.  - Personally Reviewed  Physical Exam   VS:  BP 131/90    Pulse (!) 104    Temp 97.8 F (36.6 C) (Oral)    Resp (!) 21    Ht 5\' 1"  (1.549 m)    Wt 63.2 kg    SpO2 98%    BMI 26.33 kg/m  , BMI Body mass index is 26.33 kg/m. GENERAL:  Ill-appearing.  No acute distress HEENT: Pupils equal round and reactive, fundi not visualized, oral mucosa unremarkable NECK:  No jugular venous distention, waveform within normal limits, carotid upstroke brisk and symmetric, no bruits LUNGS:  Clear to auscultation bilaterally HEART:  Bradycardic. Regular rhythm.  PMI not displaced or sustained,S1 and S2 within normal limits, no S3, no S4, no clicks, no rubs,  no murmurs CHEST: Midline incision C/D/I.  No erythema ABD:  Flat, positive bowel sounds normal in frequency in pitch, no bruits, no rebound, no guarding, no midline pulsatile mass, no hepatomegaly, no splenomegaly EXT:  2 plus pulses throughout, no edema, no cyanosis no clubbing SKIN:  Large L arm ecchymosis NEURO:  Cranial nerves II through XII grossly intact, motor grossly intact throughout PSYCH:  Cognitively intact, oriented to person place and time   Labs    High Sensitivity Troponin:   Recent Labs  Lab 05/10/19 1303 05/10/19 1527 05/10/19 1906 05/12/19 0653 05/12/19 0908  TROPONINIHS 44* 499* 975* 1,563* 1,288*      Chemistry Recent Labs  Lab 05/18/19 0402 05/19/19 0434 05/20/19 0326  NA 138 139 137  K 4.0 3.5 4.2  CL 107 105 105  CO2 25 26 27   GLUCOSE 105* 121* 103*  BUN 24* 21 17  CREATININE 0.90 0.78 0.72  CALCIUM 7.4* 7.9* 7.9*  GFRNONAA 57* >60 >60  GFRAA >60 >60 >60  ANIONGAP 6 8 5      Hematology Recent Labs  Lab 05/18/19 0402 05/19/19 0434 05/20/19 0326  WBC 16.1* 10.6* 9.7  RBC 3.70* 3.19* 3.23*  HGB  11.1* 9.5* 9.6*  HCT 33.7* 29.5* 30.0*  MCV 91.1 92.5 92.9  MCH 30.0 29.8 29.7  MCHC 32.9 32.2 32.0  RDW 17.6* 17.0* 17.1*  PLT 108* 121* 144*    BNP No results for input(s): BNP, PROBNP in the last 168 hours.   DDimer No results for input(s): DDIMER in the last 168 hours.   Radiology    No results found.  Cardiac Studies   Echo 05/16/19: 1. Left ventricular ejection fraction, by visual estimation, is 55 to 60%. The left ventricle has normal function. Left ventricular septal wall thickness was mildly increased. There is mildly increased left ventricular hypertrophy. 2. Inferior basal hypokinesis but overal EF preserved. 3. Global right ventricle has normal systolic function.The right ventricular size is normal. No increase in right ventricular wall thickness. 4. Left atrial size was normal. 5. Right atrial size was mildly dilated. 6. Moderate calcification of the mitral valve leaflet(s). 7. Moderate mitral annular calcification. 8. Moderate thickening of the mitral valve leaflet(s). 9. The mitral valve is normal in structure. No evidence of mitral valve regurgitation. No evidence of mitral stenosis. 10. The tricuspid valve is normal in structure. Tricuspid valve regurgitation is mild. 11. The aortic valve is tricuspid Aortic valve regurgitation was not visualized by color flow Doppler. Mild to moderate aortic valve sclerosis/calcification without any evidence of aortic stenosis. 12. The pulmonic valve was not well visualized. Pulmonic valve regurgitation was not assessed by color flow Doppler. 13. Post pericardial window with no tamponade or residual effuson. 14. The interatrial septum was not well visualized.  Patient Profile     85 y.o. female with COPD, hypertension, and GERD admitted with COPD exacerbation with hypoxic respiratory failure and pneumonia.  She was then found to have NSTEMI and a left heart cath had a proximal RCA stenosis.  The intervention was  complicated by perforation with tamponade and hemopericardium.  She was taken to the OR 9/29 for pericardial washout.  Course has been complicated by postoperative atrial fibrillation.  Assessment & Plan    # NSTEMI: High-sensitivity troponin was 1500.  She underwent PCI of the proximal RCA.  However this was complicated by perforation as above.  Continue aspirin, ticagrelor, atorvastatin, and metoprolol.   # Acute systolic and diastolic heart failure: Initial echo revealed LVEF 45 to 50% with RCA region wall motion abnormalities.  This improved post PCI.  # Atrial fibrillation with RVR: # SSS: She had atrial fibrillation post operatively and off and on since then.  Now on  oral amiodarone load.  She is currently in sinus rhythm.  She continues to have long postconversion pauses up to 3.7 seconds.  She reports orthostasis that does not exactly correlate with these postconversion pauses.  Would not further titrate her nodal agents.  EP is evaluating her for PPM.  Heparin was started 10/4 without a bolus.  # COPD/Hypoxic respiratory failure: She is now off supplemental oxygen. Completed 5 days of steroids/antibiotics.  Dulera with prn albuterol as recommended by critical care  # Acute blood loss anemia: Hemopericardium 2/2 coronary perforation.  She received 3u pRBB on 9/29.  H/h remains stable.   # Thrombocytopenia: Likely related to surgery and critical illness.  # Essential hypertension: Home benazepril is on hold.   # Deconditioning: She is very weak and tired.  Will need PT and I suspect rehab at discharge.   Time spent: 61minutes-Greater than 50% of this time was spent in counseling, explanation of diagnosis, planning of further management, and coordination of care.      For questions or updates, please contact [facility] Please consult [email or web address] for contact info under        Signed, Skeet Latch, MD  05/20/2019, 7:37 AM     Progress Note  Patient Name: Amy Chambers Date of Encounter: 05/20/2019  Primary Cardiologist: Evalina Field, MD  Subjective   Went back into afib so amiodarone was restarted.  She was unaware when she was in atrial fibrillation.  Her only complaint this morning is feeling weak and tired.  Inpatient Medications    Scheduled Meds:  amiodarone  400 mg Oral BID   Followed by   Derrill Memo ON 05/25/2019] amiodarone  200 mg Oral Daily   aspirin  81 mg Oral Once   atorvastatin  40 mg Oral q1800   Chlorhexidine Gluconate Cloth  6 each Topical Daily   guaiFENesin  600 mg Oral BID   levothyroxine  75 mcg Oral Daily   loratadine  10 mg Oral Daily   mouth rinse  15 mL Mouth Rinse BID   metoprolol succinate  25 mg Oral Daily   mometasone-formoterol  2 puff Inhalation BID   pantoprazole  40 mg Oral Q0600   polyethylene glycol  17 g Oral Daily   sodium chloride flush  10-40 mL Intracatheter Q12H   sodium chloride flush  3 mL Intravenous Q12H   sodium chloride flush  3 mL Intravenous Q12H   sodium chloride flush  3 mL Intravenous Q12H   ticagrelor  90 mg Oral BID   Continuous Infusions:  sodium chloride Stopped (05/17/19 2000)   dexmedetomidine (PRECEDEX) IV infusion Stopped (05/16/19 0746)   heparin 750 Units/hr (05/20/19 0600)   PRN Meds: sodium chloride, acetaminophen, albuterol, morphine injection, ondansetron **OR** ondansetron (ZOFRAN) IV, oxyCODONE, sodium chloride flush, sodium chloride flush, traMADol, traZODone   Vital Signs    Vitals:   05/20/19 0350 05/20/19 0400 05/20/19 0500 05/20/19 0600  BP:  (!) 137/48 (!) 147/53 131/90  Pulse:  60 64 (!) 104  Resp:  19 (!) 21 (!) 21  Temp: 97.8 F (36.6 C)     TempSrc: Oral     SpO2:  100% 100% 98%  Weight:   63.2 kg   Height:        Intake/Output Summary (Last 24 hours) at 05/20/2019 0737 Last data filed at 05/20/2019 0600 Gross per 24 hour  Intake 211.3 ml  Output 600 ml  Net -388.7 ml   Last 3 Weights 05/20/2019 05/19/2019 05/18/2019  Weight (lbs) 139 lb 5.3 oz 139 lb 15.9 oz 141 lb 15.6 oz  Weight (kg) 63.2 kg 63.5 kg 64.4 kg      Telemetry    Currently sinus rhythm/sinus arrhythmia in the 60s.  Previously in atrial fibrillation in the 120s.  Postconversion pauses up to 3.46 seconds.- Personally Reviewed  ECG    05/17/19: Atrial fibrillation. Rate 143 bpm.  - Personally Reviewed  Physical Exam   VS:  BP 131/90    Pulse (!) 104    Temp 97.8 F (36.6 C) (Oral)    Resp (!) 21    Ht  (1.549 m)    Wt 63.2 kg    SpO2 98%    BMI 26.33 kg/m  , BMI Body mass index is 26.33 kg/m. GENERAL:  Ill-appearing.  No acute distress HEENT: Pupils equal round and reactive, fundi not visualized, oral mucosa unremarkable NECK:  No jugular venous distention, waveform within normal limits, carotid upstroke brisk and symmetric, no bruits LUNGS:  Clear to auscultation bilaterally HEART:  RRR.  PMI not displaced or sustained,S1 and S2 within normal limits, no S3, no S4, no clicks, no rubs,  no murmurs CHEST: Midline incision C/D/I.  No erythema ABD:  Flat, positive bowel sounds normal in frequency in pitch, no bruits, no rebound, no guarding, no midline pulsatile mass, no hepatomegaly, no splenomegaly EXT:  2 plus pulses throughout, no edema, no cyanosis no clubbing SKIN:  Large L arm ecchymosis NEURO:  Cranial nerves II through XII grossly intact, motor grossly intact throughout PSYCH:  Cognitively intact, oriented to person place and time   Labs    High Sensitivity Troponin:   Recent Labs  Lab 05/10/19 1303 05/10/19 1527 05/10/19 1906 05/12/19 0653 05/12/19 0908  TROPONINIHS 44* 499* 975* 1,563* 1,288*      Chemistry Recent Labs  Lab 05/18/19 0402 05/19/19 0434 05/20/19 0326  NA 138 139 137  K 4.0 3.5 4.2  CL 107 105 105  CO2 GLUCOSE 105* 121* 103*  BUN 24* 21 17  CREATININE 0.90 0.78 0.72  CALCIUM 7.4* 7.9* 7.9*  GFRNONAA 57* >60 >60  GFRAA >60 >60 >60  ANIONGAP Hematology Recent  Labs  Lab 05/18/19 0402 05/19/19 0434 05/20/19 0326  WBC 16.1* 10.6* 9.7  RBC 3.70* 3.19* 3.23*  HGB 11.1* 9.5* 9.6*  HCT 33.7* 29.5* 30.0*  MCV 91.1 92.5 92.9  MCH 30.0 29.8 29.7  MCHC 32.9 32.2 32.0  RDW 17.6* 17.0* 17.1*  PLT 108* 121* 144*    BNP No results for input(s): BNP, PROBNP in the last 168 hours.   DDimer No results for input(s): DDIMER in the last 168 hours.   Radiology    No results found.  Cardiac Studies   Echo 05/16/19: 1. Left ventricular ejection fraction, by visual estimation, is 55 to 60%. The left ventricle has normal function. Left ventricular septal wall thickness was mildly increased. There is mildly increased left ventricular hypertrophy. 2. Inferior basal hypokinesis but overal EF preserved. 3. Global right ventricle has normal systolic function.The right ventricular size is normal. No increase in right ventricular wall thickness. 4. Left atrial size was normal. 5. Right atrial size was mildly dilated. 6. Moderate calcification of the mitral valve leaflet(s). 7. Moderate mitral annular calcification. 8. Moderate thickening of the mitral valve leaflet(s). 9. The mitral valve is normal in structure. No evidence of mitral valve regurgitation. No evidence of mitral stenosis. 10. The tricuspid  valve is normal in structure. Tricuspid valve regurgitation is mild. 11. The aortic valve is tricuspid Aortic valve regurgitation was not visualized by color flow Doppler. Mild to moderate aortic valve sclerosis/calcification without any evidence of aortic stenosis. 12. The pulmonic valve was not well visualized. Pulmonic valve regurgitation was not assessed by color flow Doppler. 13. Post pericardial window with no tamponade or residual effuson. 14. The interatrial septum was not well visualized.  Patient Profile     85 y.o. female with COPD, hypertension, and GERD admitted with COPD exacerbation with hypoxic respiratory failure and pneumonia.  She  was then found to have NSTEMI and a left heart cath had a proximal RCA stenosis.  The intervention was complicated by perforation with tamponade and hemopericardium.  She was taken to the OR 9/29 for pericardial washout.  Course has been complicated by postoperative atrial fibrillation.  Assessment & Plan    # NSTEMI: High-sensitivity troponin was 1500.  She underwent PCI of the proximal RCA.  However this was complicated by perforation as above.  Continue aspirin, ticagrelor, atorvastatin, and metoprolol.   # Acute systolic and diastolic heart failure:  Initial echo revealed LVEF 45 to 50% with RCA region wall motion abnormalities.  This improved post PCI.  # Atrial fibrillation with RVR: She had atrial fibrillation post operatively and off and on since then.  Now on oral amiodarone load.  She is currently in sinus rhythm.  She does have long postconversion pauses up to 3.46 seconds.  So far she has been asymptomatic.  Would not further titrate her nodal agents. ?heparin   # COPD/Hypoxic respiratory failure: She is currently on 1 L supplemental oxygen.  She does not use oxygen at home.  Completed 5 days of steroids/antibiotics.  Dulera with prn albuterol as recommended by critical care  # Acute blood loss anemia: Hemopericardium 2/2 coronary perforation.  She received 3u pRBB on 9/29.  H/h remains stable.   # Thrombocytopenia: Likely related to surgery and critical illness.  # Essential hypertension: Home benazepril is on hold.   Time spent: 35 minutes-Greater than 50% of this time was spent in counseling, explanation of diagnosis, planning of further management, and coordination of care.      For questions or updates, please contact [facility] Please consult [email or web address] for contact info under        Signed, Chilton Si, MD  05/20/2019, 7:37 AM

## 2019-05-20 NOTE — Progress Notes (Signed)
CT surgery p.m. Rounds  Patient resting quietly in bed Vital signs stable Sternal incision healing well

## 2019-05-20 NOTE — Progress Notes (Signed)
Physical Therapy Treatment Patient Details Name: Amy Chambers MRN: [id number] DOB: [birth year] Today's Date: 05/20/2019    History of Present Illness 85 yo female with onset of hypoxia, hypercapnia and acute respiratory failure was admitted, may have been a medication reaction.  Cleared for PE, Covid (-).  PMHx:  thyroid disease, COPD, HTN, atherosclerosis carotids, bronchitis, underwent cath on 9/29 and developed post procedure hypotension found to have pericardial effusion with concern for tampanade due to coronary bleed.  Underwent emergent sternotomy, and evacuation of pericardial effusion.  Extubated 10/1.    PT Comments    Pt was so fatigued that she needed extra encouragement and a later appointment time to allow more rest.  Emphasis on transitions to EOB, sit to stand and progressing gait in the EVA walker.  Also stressed sternal precautions.    Follow Up Recommendations  CIR     Equipment Recommendations  Other (comment)(TBA)    Recommendations for Other Services       Precautions / Restrictions Precautions Precautions: Sternal;Fall    Mobility  Bed Mobility Overal bed mobility: Needs Assistance Bed Mobility: Supine to Sit;Sit to Sidelying     Supine to sit: Mod assist   Sit to sidelying: Mod assist;+2 for safety/equipment General bed mobility comments: Reinforced sternal precautions.  Assisted trunk up and forward and legs into bed latying down  Transfers Overall transfer level: Needs assistance Equipment used: (EVA walker) Transfers: Sit to/from Stand Sit to Stand: Min assist         General transfer comment: cues for hand placement and extra assist due to fatigue  Ambulation/Gait Ambulation/Gait assistance: Min guard;+2 safety/equipment Gait Distance (Feet): 130 Feet Assistive device: Rolling walker (2 wheeled)(EVA walker) Gait Pattern/deviations: Step-through pattern;Decreased stride length Gait velocity: decreased Gait velocity interpretation: <1.31  ft/sec, indicative of household ambulator General Gait Details: pt relying on the EVA walker and stooped onto her elbow most of the distance when not cued to stay upright.   Stairs             Wheelchair Mobility    Modified Rankin (Stroke Patients Only)       Balance Overall balance assessment: Needs assistance Sitting-balance support: No upper extremity supported Sitting balance-Leahy Scale: Fair     Standing balance support: Bilateral upper extremity supported Standing balance-Leahy Scale: Poor Standing balance comment: reliant on UE support with EVA walker                            Cognition Arousal/Alertness: Awake/alert Behavior During Therapy: WFL for tasks assessed/performed Overall Cognitive Status: Within Functional Limits for tasks assessed                                        Exercises      General Comments General comments (skin integrity, edema, etc.): On 2 L , pt's sats maintained in the low 90's.      Pertinent Vitals/Pain Pain Assessment: Faces Faces Pain Scale: Hurts a little bit Pain Location: chest while using EVA walker Pain Descriptors / Indicators: Grimacing;Moaning Pain Intervention(s): Monitored during session    Home Living                      Prior Function            PT Goals (current goals can now be found in the  care plan section) Acute Rehab PT Goals Patient Stated Goal: to get home and feel stronger PT Goal Formulation: With patient/family Time For Goal Achievement: 05/30/19 Potential to Achieve Goals: Good Progress towards PT goals: Progressing toward goals    Frequency    Min 3X/week      PT Plan Current plan remains appropriate    Co-evaluation              AM-PAC PT "6 Clicks" Mobility   Outcome Measure  Help needed turning from your back to your side while in a flat bed without using bedrails?: A Lot Help needed moving from lying on your back to sitting  on the side of a flat bed without using bedrails?: A Lot Help needed moving to and from a bed to a chair (including a wheelchair)?: A Little Help needed standing up from a chair using your arms (e.g., wheelchair or bedside chair)?: A Little Help needed to walk in hospital room?: A Little Help needed climbing 3-5 steps with a railing? : A Lot 6 Click Score: 15    End of Session Equipment Utilized During Treatment: Oxygen Activity Tolerance: Patient limited by fatigue Patient left: in bed;with call bell/phone within reach Nurse Communication: Mobility status;Precautions PT Visit Diagnosis: Other abnormalities of gait and mobility (R26.89);Muscle weakness (generalized) (M62.81)     Time: 2423-5361 PT Time Calculation (min) (ACUTE ONLY): 23 min  Charges:  $Gait Training: 8-22 mins $Therapeutic Activity: 8-22 mins                     05/20/2019  Donnella Sham, PT Acute Rehabilitation Services [phone]  (pager) [phone]  (office)   Tessie Fass Dimas Scheck 05/20/2019, 6:12 PM

## 2019-05-20 NOTE — Progress Notes (Signed)
Chaplain engaged in initial visit with Ms. Amy Chambers and her daughter, Amy Chambers.  Chaplain explained spiritual support services.  Chaplain offered prayer to Ms. Amy Chambers and daughter who said she may be going for another surgery today.  Please page as needed. Will follow-up.

## 2019-05-20 NOTE — Progress Notes (Addendum)
[place] for heparin Indication: atrial fibrillation  Allergies  Allergen Reactions  . Crestor [Rosuvastatin Calcium] Other (See Comments)    Cramping/pain in lower extremeties  . Niaspan [Niacin Er] Other (See Comments)    Cramping/pain in lower extremeties    Patient Measurements: Height: 5\' 1"  (154.9 cm) Weight: 139 lb 5.3 oz (63.2 kg) IBW/kg (Calculated) : 47.8   Vital Signs: Temp: 98.4 F (36.9 C) (10/05 0800) Temp Source: Oral (10/05 0800) BP: 95/37 (10/05 0900) Pulse Rate: 66 (10/05 0900)  Labs: Recent Labs    05/18/19 0402 05/19/19 0434 05/19/19 1725 05/20/19 0326 05/20/19 0335  HGB 11.1* 9.5*  --  9.6*  --   HCT 33.7* 29.5*  --  30.0*  --   PLT 108* 121*  --  144*  --   HEPARINUNFRC  --   --  <0.10*  --  0.19*  CREATININE 0.90 0.78  --  0.72  --     Estimated Creatinine Clearance: 40.6 mL/min (by C-G formula based on SCr of 0.72 mg/dL).   Medical History: Past Medical History:  Diagnosis Date  . Cataract   . COPD (chronic obstructive pulmonary disease) ([place])   . GERD (gastroesophageal reflux disease)   . Hypertension   . Osteopenia   . Thyroid disease     Assessment: 89yof s/p PCI to RCA complicated by hemopericardium > OR for washout and CABG x1 9/29.  Currently on DAPT for DES in RCA. Post op Afib noted and now on heparin. She is noted on brilinta  Heparin level 0.3 units/ml.  No bleeding reported   Goal of Therapy:  Heparin level 0.3-0.5 Monitor platelets by anticoagulation protocol: Yes   Plan:  Increase heparin to 800 units/hr to keep within range Will follow long term [facility] plans  Thanks for allowing pharmacy to be a part of this patient's care.  Erin Hearing PharmD., BCPS Clinical Pharmacist 05/20/2019 10:31 AM

## 2019-05-20 NOTE — Progress Notes (Signed)
RN paged [facility] for order for diet. He stated Dr. Lovena Le wouldn't be around until later so patient could eat and will hold off on pacer for today.

## 2019-05-20 NOTE — Consult Note (Addendum)
ELECTROPHYSIOLOGY CONSULT NOTE    Patient ID: Amy Chambers MRN: [id number], DOB/AGE: [birth year] 83 y.o.  Admit date: 05/10/2019 Date of Consult: 05/20/2019  Primary Physician: Chevis Pretty, [place] Primary Cardiologist: Dr. Audie Box Electrophysiologist: New to Dr. Lovena Le  Referring Provider: Dr. Oval Linsey  Patient Profile: Amy Chambers is a 85 y.o. female with a history of COPD, HTN, and GERD who is being seen today for the evaluation of Afib RVR/tachybrady at the request of Dr. Oval Linsey.  HPI:  Amy Chambers is a 85 y.o. female admitted with COPD exacerbation with acute respiratory failure and PNA. Found to have NSTEMI and LHC showed proximal RCA stenosis. Intervention was complicated by perforation with tamponade and hemopericardium. She was taken to the OR 9/29 for pericardial washout. Course has been additionally complicated with post-op atrial fibrillation, and post conversion pauses up to ~3.7 seconds.   She remains fatigued, sore, and short of breath at time. Mild lightheadedness with standing.   Past Medical History:  Diagnosis Date   Cataract    COPD (chronic obstructive pulmonary disease) (HCC)    GERD (gastroesophageal reflux disease)    Hypertension    Osteopenia    Thyroid disease      Surgical History:  Past Surgical History:  Procedure Laterality Date   CATARACT EXTRACTION, BILATERAL     CORONARY STENT INTERVENTION N/A 05/14/2019   Procedure: CORONARY STENT INTERVENTION;  Surgeon: Troy Sine, MD;  Location: [facility];  Service: Cardiovascular;  Laterality: N/A;   LEFT HEART CATH AND CORONARY ANGIOGRAPHY N/A 05/14/2019   Procedure: LEFT HEART CATH AND CORONARY ANGIOGRAPHY;  Surgeon: Leonie Man, MD;  Location: [facility];  Service: Cardiovascular;  Laterality: N/A;   LEFT HEART CATH AND CORONARY ANGIOGRAPHY N/A 05/14/2019   Procedure: LEFT HEART CATH AND CORONARY ANGIOGRAPHY;  Surgeon: Troy Sine, MD;  Location: [facility];  Service: Cardiovascular;  Laterality: N/A;   MEDIASTINAL EXPLORATION N/A 05/14/2019   Procedure: Mediastinal Exploration with Washout;  Surgeon: Lajuana Matte, MD;  Location: [facility];  Service: Open Heart Surgery;  Laterality: N/A;     Medications Prior to Admission  Medication Sig Dispense Refill Last Dose   albuterol (VENTOLIN HFA) 108 (90 Base) MCG/ACT inhaler INHALE 2 PUFFS INTO LUNGS EVERY 6 HOURS AS NEEDED FOR WHEEZING (Patient taking differently: Inhale 2 puffs into the lungs every 6 (six) hours as needed for wheezing or shortness of breath. ) 9 g 2 05/10/2019 at Unknown time   amoxicillin-clavulanate (AUGMENTIN) 875-125 MG tablet Take 1 tablet by mouth 2 (two) times daily. 14 tablet 0 05/10/2019 at 1030   aspirin 81 MG tablet Take 81 mg by mouth daily.   05/10/2019 at 0600   benazepril (LOTENSIN) 20 MG tablet Take 1 tablet (20 mg total) by mouth daily. 90 tablet 1 05/10/2019 at 0600   budesonide-formoterol (SYMBICORT) 160-4.5 MCG/ACT inhaler Inhale 2 puffs into the lungs 2 (two) times daily. 1 Inhaler 3 05/10/2019 at Unknown time   calcium-vitamin D 250-100 MG-UNIT per tablet Take 1 tablet by mouth 2 (two) times daily.    05/10/2019 at Unknown time   fluticasone (FLONASE) 50 MCG/ACT nasal spray Place 2 sprays into both nostrils daily. 16 g 6 05/10/2019 at Unknown time   levothyroxine (SYNTHROID) 75 MCG tablet Take 1 tablet (75 mcg total) by mouth daily. 90 tablet 1 05/10/2019 at 0600   polyethylene glycol (MIRALAX / GLYCOLAX) packet Take 17 g by mouth daily.   05/10/2019  at Unknown time   VITAMIN D PO Take 1 capsule by mouth daily.   05/10/2019 at Unknown time    Inpatient Medications:   amiodarone  400 mg Oral BID   Followed by   Melene Muller[START ON 05/25/2019] amiodarone  200 mg Oral Daily   aspirin  81 mg Oral Once   atorvastatin  40 mg Oral q1800   Chlorhexidine Gluconate Cloth  6 each Topical Daily   guaiFENesin  600 mg Oral BID   levothyroxine  75 mcg Oral  Daily   loratadine  10 mg Oral Daily   mouth rinse  15 mL Mouth Rinse BID   metoprolol succinate  25 mg Oral Daily   mometasone-formoterol  2 puff Inhalation BID   pantoprazole  40 mg Oral Q0600   polyethylene glycol  17 g Oral Daily   sodium chloride flush  10-40 mL Intracatheter Q12H   sodium chloride flush  3 mL Intravenous Q12H   sodium chloride flush  3 mL Intravenous Q12H   sodium chloride flush  3 mL Intravenous Q12H   ticagrelor  90 mg Oral BID    Allergies:  Allergies  Allergen Reactions   Crestor [Rosuvastatin Calcium] Other (See Comments)    Cramping/pain in lower extremeties   Niaspan [Niacin Er] Other (See Comments)    Cramping/pain in lower extremeties    Social History   Socioeconomic History   Marital status: Married    Spouse name: Not on file   Number of children: Not on file   Years of education: Not on file   Highest education level: Not on file  Occupational History   Not on file  Social Needs   Financial resource strain: Not on file   Food insecurity    Worry: Not on file    Inability: Not on file   Transportation needs    Medical: Not on file    Non-medical: Not on file  Tobacco Use   Smoking status: Former Smoker    Quit date: 08/15/1994    Years since quitting: 24.7   Smokeless tobacco: Former NeurosurgeonUser    Types: Chew    Quit date: 08/15/1998  Substance and Sexual Activity   Alcohol use: No   Drug use: No   Sexual activity: Not Currently  Lifestyle   Physical activity    Days per week: Not on file    Minutes per session: Not on file   Stress: Not on file  Relationships   Social connections    Talks on phone: Not on file    Gets together: Not on file    Attends religious service: Not on file    Active member of club or organization: Not on file    Attends meetings of clubs or organizations: Not on file    Relationship status: Not on file   Intimate partner violence    Fear of current or ex partner: Not on  file    Emotionally abused: Not on file    Physically abused: Not on file    Forced sexual activity: Not on file  Other Topics Concern   Not on file  Social History Narrative   Not on file     Family History  Problem Relation Age of Onset   Diabetes Mother    Cancer Brother    Tuberculosis Father    Early death Sister    Early death Brother    COPD Brother    Emphysema Brother    Early death Sister  COPD Sister    Emphysema Sister    Aneurysm Sister    Stroke Sister    Cancer Sister    Hypertension Son    Hypertension Daughter    Fibromyalgia Daughter    Hypertension Daughter      Review of Systems: All other systems reviewed and are otherwise negative except as noted above.  Physical Exam: Vitals:   05/20/19 0400 05/20/19 0500 05/20/19 0600 05/20/19 0748  BP: (!) 137/48 (!) 147/53 131/90   Pulse: 60 64 (!) 104   Resp: 19 (!) 21 (!) 21   Temp:    98.1 F (36.7 C)  TempSrc:    Oral  SpO2: 100% 100% 98%   Weight:  63.2 kg    Height:        GEN- The patient is well appearing, alert and oriented x 3 today.   HEENT: normocephalic, atraumatic; sclera clear, conjunctiva pink; hearing intact; oropharynx clear; neck supple Lungs- Clear to ausculation bilaterally, normal work of breathing.  No wheezes, rales, rhonchi Heart- Regular rate and rhythm, no murmurs, rubs or gallops GI- soft, non-tender, non-distended, bowel sounds present Extremities- no clubbing, cyanosis, or edema; DP/PT/radial pulses 2+ bilaterally MS- no significant deformity or atrophy Skin- warm and dry, no rash or lesion Psych- euthymic mood, full affect Neuro- strength and sensation are intact  Labs:   Lab Results  Component Value Date   WBC 9.7 05/20/2019   HGB 9.6 (L) 05/20/2019   HCT 30.0 (L) 05/20/2019   MCV 92.9 05/20/2019   PLT 144 (L) 05/20/2019    Recent Labs  Lab 05/20/19 0326  NA 137  K 4.2  CL 105  CO2 27  BUN 17  CREATININE 0.72  CALCIUM 7.9*    GLUCOSE 103*      Radiology/Studies: Ct Abdomen Pelvis Wo Contrast  Result Date: 05/14/2019 CLINICAL DATA:  86 year old female with back pain and hypertension. Status post catheterization. Rule out retroperitoneal hematoma. EXAM: CT ABDOMEN AND PELVIS WITHOUT CONTRAST TECHNIQUE: Multidetector CT imaging of the abdomen and pelvis was performed following the standard protocol without IV contrast. COMPARISON:  Chest CT dated 05/10/2019 FINDINGS: Evaluation of this exam is limited in the absence of intravenous contrast. Lower chest: Mild emphysematous changes of the visualized lung bases. Several punctate calcified granuloma at the left lung base. Probable trace right pleural effusion. Partially visualized high attenuating pericardial effusion measuring up to 8 mm in thickness most consistent with hemopericardium. This is new since the prior CT. Clinical correlation is recommended. There is coronary vascular calcification. No intra-abdominal free air or free fluid. No intraperitoneal or retroperitoneal hematoma. Hepatobiliary: Several hepatic hypodense lesions which are not completely characterized this non-contrast CT and measure up to approximately 3 cm. These demonstrate fluid attenuation most consistent with cysts. High attenuating content within the gallbladder likely represents vicarious excretion of injected contrast from recent catheterization. Small gallstones are not excluded. No pericholecystic fluid. Pancreas: Unremarkable. No pancreatic ductal dilatation or surrounding inflammatory changes. Spleen: Normal in size without focal abnormality. Adrenals/Urinary Tract: The adrenal glands are unremarkable. Enhancing renal parenchyma secondary to recent contrast administration. Multiple bilateral renal cysts measure up to 3.3 cm on the left. Additional smaller hypodensities are too small to characterize. There is no hydronephrosis on either side. Excreted contrast noted the partially distended urinary  bladder. Stomach/Bowel: There is sigmoid diverticulosis without active inflammatory changes. There is no bowel obstruction or active inflammation. The appendix is normal. There is a 4 cm duodenal diverticula. Vascular/Lymphatic: Advanced aortoiliac atherosclerotic disease.  Left femoral venous line with tip in the left common femoral vein. Right common iliac artery catheterization with tip in the right external iliac artery. No hematoma. The IVC is grossly unremarkable. Small pocket of air in the upper IVC, likely introduced via catheterization. No portal venous gas. There is no adenopathy. Reproductive: The uterus and ovaries are grossly unremarkable. Other: None Musculoskeletal: Osteopenia with degenerative changes of the spine. No acute osseous pathology. IMPRESSION: 1. No acute intra-abdominal or pelvic pathology. No intraperitoneal or retroperitoneal hematoma. 2. Partially visualized hemopericardium. Clinical correlation is recommended. 3. Small pocket of air within the upper IVC, iatrogenic. 4. Sigmoid diverticulosis. 5. Advanced aortic Atherosclerosis (ICD10-I70.0). These results were called by telephone at the time of interpretation on 05/14/2019 at 7:52 pm to provider O'Neal, who verbally acknowledged these results. Electronically Signed   By: Elgie Collard M.D.   On: 05/14/2019 19:56   Dg Abd 1 View  Result Date: 05/15/2019 CLINICAL DATA:  Orogastric tube placement. EXAM: ABDOMEN - 1 VIEW COMPARISON:  07/02/2018 and chest x-ray 05/15/2019 FINDINGS: Evidence of patient's nasogastric tube with tip over the mid abdomen just right of midline likely over the distal stomach. Bowel gas pattern is nonobstructive. Remainder of the exam is unchanged including 2 catheters overlying the heart. IMPRESSION: Nonobstructive bowel gas pattern. Enteric tube with tip just right of midline in the mid abdomen likely over the distal stomach. Electronically Signed   By: Elberta Fortis M.D.   On: 05/15/2019 16:46   Ct Soft  Tissue Neck Wo Contrast  Result Date: 05/10/2019 CLINICAL DATA:  Arterial stricture/occlusion head neck. COPD with short of breath and respiratory distress. EXAM: CT NECK WITHOUT CONTRAST TECHNIQUE: Multidetector CT imaging of the neck was performed following the standard protocol without intravenous contrast. COMPARISON:  None. FINDINGS: Pharynx and larynx: Normal. No mass or swelling. Normal airway. No pharyngeal edema. Salivary glands: No inflammation, mass, or stone. Thyroid: Negative Lymph nodes: No pathologic lymph nodes in the neck. Vascular: Limited vascular evaluation without intravenous contrast. Arterial stricture or stenosis not evaluated on the study. There is atherosclerotic calcification in the carotid bulb bilaterally. Atherosclerotic calcification also in the cavernous carotid bilaterally. Limited intracranial: Negative Visualized orbits: Negative Mastoids and visualized paranasal sinuses: Negative Skeleton: Cervical spondylosis.  No acute skeletal abnormality. Upper chest: Mild apical emphysema. No acute abnormality in the lung apices. Other: None IMPRESSION: No acute abnormality Atherosclerotic calcification of the carotid arteries bilaterally. Arterial stricture occlusion not excluded on this study without intravenous contrast Airway intact. Electronically Signed   By: Marlan Palau M.D.   On: 05/10/2019 17:13   Ct Angio Chest Pe W And/or Wo Contrast  Result Date: 05/10/2019 CLINICAL DATA:  COPD, hypertension, respiratory distress, high pretest probability for PE EXAM: CT ANGIOGRAPHY CHEST WITH CONTRAST TECHNIQUE: Multidetector CT imaging of the chest was performed using the standard protocol during bolus administration of intravenous contrast. Multiplanar CT image reconstructions and MIPs were obtained to evaluate the vascular anatomy. CONTRAST:  75mL OMNIPAQUE IOHEXOL 350 MG/ML SOLN COMPARISON:  None. FINDINGS: Cardiovascular: Heart size upper limits normal. Some reflux of contrast from  the right atrium into hepatic veins. The RV is nondilated. Satisfactory opacification of pulmonary arteries noted, and there is no evidence of pulmonary emboli. Moderate coronary calcifications. Fair contrast opacification of the thoracic aorta with no suggestion of dissection, aneurysm, or stenosis. There is classic 3-vessel brachiocephalic arch anatomy without proximal stenosis. Calcified plaque in the arch and descending thoracic segment. Visualized proximal abdominal aorta is atheromatous without aneurysm. Mediastinum/Nodes:  no hilar or mediastinal adenopathy. Lungs/Pleura: No pleural effusion. No pneumothorax. Pulmonary emphysema. Upper Abdomen: 2.9 cm probable cyst in hepatic segment 4A. Smaller low-attenuation lesions in the right hepatic lobe are incompletely visualized and characterized. No acute findings. Musculoskeletal: Anterior vertebral endplate spurring at multiple levels in the mid and lower thoracic spine. No fracture or worrisome bone lesion. Review of the MIP images confirms the above findings. IMPRESSION: 1. Negative for acute PE or thoracic aortic dissection. 2. Coronary and Aortic Atherosclerosis (ICD10-170.0). Electronically Signed   By: Corlis Leak M.D.   On: 05/10/2019 17:07   Dg Chest Port 1 View  Result Date: 05/16/2019 CLINICAL DATA:  ETT placement EXAM: PORTABLE CHEST 1 VIEW COMPARISON:  05/15/2019 FINDINGS: Endotracheal tube with the tip 3 cm above the carina. Nasogastric tube coursing below the diaphragm. Left-sided chest tube in unchanged position. Left jugular central venous catheter with the tip projecting over the SVC. Mediastinal drain in unchanged position. No focal consolidation, pleural effusion or pneumothorax. Mild left basilar atelectasis. Stable cardiomediastinal silhouette. Prior median sternotomy. IMPRESSION: 1. Support lines and tubing in satisfactory position. Electronically Signed   By: Elige Ko   On: 05/16/2019 08:07   Dg Chest Port 1 View  Result Date:  05/15/2019 CLINICAL DATA:  Central line placement EXAM: PORTABLE CHEST 1 VIEW COMPARISON:  05/15/2019, 5:23 a.m. FINDINGS: Interval placement of a left neck vascular catheter, tip projecting over the midportion of the SVC. Otherwise unchanged examination with endotracheal tube projecting above the carina, chest or mediastinal drainage tubes, and no acute abnormality of the lungs and no significant pneumothorax. IMPRESSION: 1. Interval placement of a left neck vascular catheter, tip projecting over the midportion of the SVC. 2. Otherwise unchanged examination with endotracheal tube projecting above the carina, chest or mediastinal drainage tubes, and no acute abnormality of the lungs and no significant pneumothorax. Electronically Signed   By: Lauralyn Primes M.D.   On: 05/15/2019 11:33   Dg Chest Port 1 View  Result Date: 05/15/2019 CLINICAL DATA:  ET tube, chest tube, pneumothorax EXAM: PORTABLE CHEST 1 VIEW COMPARISON:  05/12/2019 FINDINGS: Endotracheal tube tip is 2.5 cm above the carina. Left basilar chest tube or mediastinal drain is in place. No pneumothorax. Bibasilar atelectasis. No visible effusions. IMPRESSION: Bibasilar atelectasis. No visible pneumothorax. Electronically Signed   By: Charlett Nose M.D.   On: 05/15/2019 08:29   Dg Chest Port 1 View  Result Date: 05/12/2019 CLINICAL DATA:  Shortness of breath and lethargy EXAM: PORTABLE CHEST 1 VIEW COMPARISON:  05/10/2019, 03/04/2019 FINDINGS: Hyperinflated lungs with emphysematous disease. Mild scarring at the bases. No consolidation or effusion. Stable cardiomediastinal silhouette with aortic atherosclerosis. Probable skin fold artifact over the right upper and lower chest. IMPRESSION: 1. Hyperinflated lungs with emphysematous disease. No focal pulmonary infiltrate. 2. Suspected skin fold artifact over the right upper lung, no definitive pneumothorax seen. Electronically Signed   By: Jasmine Pang M.D.   On: 05/12/2019 16:12   Dg Chest Portable 1  View  Result Date: 05/10/2019 CLINICAL DATA:  Pt brought in by [facility] with c/o SOB that started suddenly around noon. EMS reports no air movement, O2 sat 61% on RA, diaphoretic. EMS reports pt has become less lethargic. EMS gave 2 Albuterol neb, Solumedrol, Epi 0.15 subq. O2 sat increased to 98% with NRB but no change in mental status and pt denies SOB getting better. EMS reports pt took 1st dose of Amoxicillin at 1030 for ear pain that pt has been having for 2 days.  Pt has taken PCN before with no problems.HISTORY OF HTN, COPD EXAM: PORTABLE CHEST 1 VIEW COMPARISON:  03/04/2019 and older exams. FINDINGS: Cardiac silhouette is top-normal in size. No mediastinal hilar masses. Prominent bronchovascular markings, stable. Lungs hyperexpanded but otherwise clear with no evidence of pneumonia or edema. No convincing pleural effusion and no pneumothorax. Skeletal structures are demineralized but grossly intact. IMPRESSION: 1. No acute cardiopulmonary disease. Electronically Signed   By: Amie Portland M.D.   On: 05/10/2019 13:38   Ct Angio Chest/abd/pel For Dissection W And/or W/wo  Result Date: 05/14/2019 CLINICAL DATA:  85 year old female status post cardiac catheterization. Concern for dissection. EXAM: CT ANGIOGRAPHY CHEST, ABDOMEN AND PELVIS TECHNIQUE: Multidetector CT imaging through the chest, abdomen and pelvis was performed using the standard protocol during bolus administration of intravenous contrast. Multiplanar reconstructed images and MIPs were obtained and reviewed to evaluate the vascular anatomy. CONTRAST:  OMNIPAQUE IOHEXOL 350 MG/ML SOLN COMPARISON:  Earlier CT of the abdomen pelvis dated 05/14/2019 FINDINGS: CTA CHEST FINDINGS Cardiovascular: There is no cardiomegaly. High attenuating pericardial effusion measures up to 11 mm in thickness. This is most consistent with hemopericardium. There is multi vessel coronary vascular calcification. Evaluation of the heart is limited due to cardiac  motion. There is moderate calcified and noncalcified plaque of the thoracic aorta. No aneurysmal dilatation or dissection. The visualized origins of the great vessels of the aortic arch appear patent. There is a diminutive left vertebral artery. The central pulmonary arteries appear patent. Mediastinum/Nodes: No hilar or mediastinal adenopathy. The esophagus is grossly unremarkable. No mediastinal fluid collection. Lungs/Pleura: There is emphysematous changes of the lungs. Trace bilateral pleural effusions. No focal consolidation or pneumothorax. The central airways are patent. Musculoskeletal: Degenerative changes of the spine. No acute osseous pathology. Review of the MIP images confirms the above findings. CTA ABDOMEN AND PELVIS FINDINGS VASCULAR Aorta: Advanced atherosclerotic calcification. No aneurysmal dilatation or dissection. Celiac: Atherosclerotic calcification and narrowing of the origin of the celiac axis. The celiac axis remains patent. SMA: Atherosclerotic calcification and narrowing of the origin of the SMA. The SMA is patent. Renals: Atherosclerotic calcification of the origins of the renal arteries. The renal arteries remain patent although appear narrowed. Accessory arteries noted to the lower pole of the kidneys bilaterally. IMA: The IMA is patent. Inflow: Advanced atherosclerotic calcification of the iliac arteries. There is irregularity of the common iliac arteries with focal ectasia of the right common iliac artery measuring up to 13 mm. The iliac arteries remain patent. Right femoral artery catheter with tip in the right external iliac artery noted. Veins: No obvious venous abnormality within the limitations of this arterial phase study. A small pocket of air in the upper IVC, iatrogenic. Left common femoral vein catheter with tip left common iliac vein. No fluid collection or hematoma. Review of the MIP images confirms the above findings. NON-VASCULAR Hepatobiliary: Multiple hepatic  hypodense lesions, likely cysts. Mild irregularity of the liver contour likely cirrhosis. There appears to be partial cannulization of the paraumbilical vein indicative of portal hypertension. No intrahepatic biliary ductal dilatation. Excreted contrast within the gallbladder. Pancreas: Unremarkable. No pancreatic ductal dilatation or surrounding inflammatory changes. Spleen: Normal in size without focal abnormality. Adrenals/Urinary Tract: The adrenal glands are unremarkable. Bilateral renal cysts measuring up to 4 cm and smaller hypodense lesions which are not characterized. Hyperenhancement of the renal parenchyma concerning for a degree of renal dysfunction. Clinical correlation is recommended. No hydronephrosis. The urinary bladder is filled with excreted contrast. Stomach/Bowel: There is a  4 cm duodenal diverticulum. There is sigmoid diverticulosis without active inflammatory changes. A rectal tube is in place. There is no bowel obstruction. The appendix is normal. Lymphatic: No adenopathy. Reproductive: The uterus and ovaries are grossly unremarkable. Other: No intra-abdominal fluid or free air. No hematoma. Musculoskeletal: Osteopenia with degenerative changes of the spine. No acute osseous pathology. Review of the MIP images confirms the above findings. IMPRESSION: 1. No CT evidence of aortic dissection or aneurysm.  No hematoma. 2. High attenuating pericardial effusion most consistent with hemopericardium. 3. Coronary vascular calcification. 4. Colonic diverticulosis. No bowel obstruction or active inflammation. Normal appendix. 5. Hyperenhancement of the renal parenchyma concerning for a degree of renal dysfunction. Clinical correlation is recommended. 6. Aortic Atherosclerosis (ICD10-I70.0) and Emphysema (ICD10-J43.9). Electronically Signed   By: Elgie Collard M.D.   On: 05/14/2019 21:03    EKG:05/17/2019 shows Afib RVR at 142 bpm (personally reviewed).  TELEMETRY: NSR currently 60-70s, Continues  to have intermittent Afib. Afib from [phone] this am, and last night from ~1630 to 2045. Noted 2-2.4 second post conversion causes frequently. (personally reviewed)  Assessment/Plan: 1.  Afib with RVR Continue oral amiodarone load.  Continues to come in and out of Afib with post conversion pauses, ranging from 2 to 3.7 seconds (longest past 24 hrs has been 2.4 seconds) We will follow for pacemaker consideration.   2. NSTEMI S/p PCI of the proximal RCA -> complicated with perforation.  She is stable post mediastinal washout. TCTS following.  3. COPD/Resp failure Complete ABX. Intermittently on supplemental O2.  For questions or updates, please contact [facility] Please consult [email or web address] for contact info under Cardiology/STEMI.  Dustin Flock, PA-C  05/20/2019 9:13 AM  EP Attening  Patient seen and examined on 05/20/19. She has symptomatic tachy-brady syndrome with PAF and pasuses. She has been started on an oral amio load which she is tolerating. Hopefully the atrial fib and long pauses will improve. If not she will need a PPM. I suspect that the atrial fib is being driven by pericardial inflammation which should get better in the coming weeks. For now we can hold off on PPM. As she is having some pauses, would watch in the ICU another 24 hours.  Leonia Reeves.D.

## 2019-05-21 DIAGNOSIS — I214 Non-ST elevation (NSTEMI) myocardial infarction: Principal | ICD-10-CM

## 2019-05-21 DIAGNOSIS — I4891 Unspecified atrial fibrillation: Secondary | ICD-10-CM

## 2019-05-21 LAB — BASIC METABOLIC PANEL
Anion gap: 10 (ref 5–15)
BUN: 15 mg/dL (ref 8–23)
CO2: 26 mmol/L (ref 22–32)
Calcium: 7.8 mg/dL — ABNORMAL LOW (ref 8.9–10.3)
Chloride: 101 mmol/L (ref 98–111)
Creatinine, Ser: 0.84 mg/dL (ref 0.44–1.00)
GFR calc Af Amer: >60 mL/min (ref >60)
GFR calc non Af Amer: >60 mL/min (ref >60)
Glucose, Bld: 88 mg/dL (ref 70–99)
Potassium: 3.8 mmol/L (ref 3.5–5.1)
Sodium: 137 mmol/L (ref 135–145)

## 2019-05-21 LAB — HEPARIN LEVEL (UNFRACTIONATED): Heparin Unfractionated: 0.51 IU/mL (ref 0.30–0.70)

## 2019-05-21 LAB — CBC
HCT: 29.8 % — ABNORMAL LOW (ref 36.0–46.0)
Hemoglobin: 9.1 g/dL — ABNORMAL LOW (ref 12.0–15.0)
MCH: 29.1 pg (ref 26.0–34.0)
MCHC: 30.5 g/dL (ref 30.0–36.0)
MCV: 95.2 fL (ref 80.0–100.0)
Platelets: 171 10*3/uL (ref 150–400)
RBC: 3.13 MIL/uL — ABNORMAL LOW (ref 3.87–5.11)
RDW: 17.1 % — ABNORMAL HIGH (ref 11.5–15.5)
WBC: 10.8 10*3/uL — ABNORMAL HIGH (ref 4.0–10.5)
nRBC: 0 % (ref 0.0–0.2)

## 2019-05-21 MED ORDER — CLOPIDOGREL BISULFATE 75 MG PO TABS
300.0000 mg | ORAL_TABLET | Freq: Once | ORAL | Status: AC
  Administered 2019-05-22: 300 mg via ORAL
  Filled 2019-05-21: qty 4

## 2019-05-21 MED ORDER — CLOPIDOGREL BISULFATE 300 MG PO TABS: 600.0000 mg | ORAL_TABLET | Freq: Once | ORAL | Status: DC

## 2019-05-21 MED ORDER — CLOPIDOGREL BISULFATE 75 MG PO TABS
75.0000 mg | ORAL_TABLET | Freq: Every day | ORAL | Status: DC
  Administered 2019-05-23 – 2019-05-31 (×9): 75 mg via ORAL
  Filled 2019-05-21 (×9): qty 1

## 2019-05-21 MED ORDER — ENSURE ENLIVE PO LIQD
237.0000 mL | Freq: Two times a day (BID) | ORAL | Status: DC
  Administered 2019-05-21 – 2019-05-28 (×4): 237 mL via ORAL

## 2019-05-21 NOTE — Progress Notes (Signed)
Progress Note  Patient Name: Amy Chambers Date of Encounter: 05/21/2019  Primary Cardiologist: Reatha Harps, MD  Subjective   Still feeling tired.  Walking today went better than the day before.  She denies lightheadedness or palpitations.   Inpatient Medications    Scheduled Meds: . amiodarone  400 mg Oral BID   Followed by  . [START ON 05/25/2019] amiodarone  200 mg Oral Daily  . aspirin  81 mg Oral Once  . atorvastatin  40 mg Oral q1800  . Chlorhexidine Gluconate Cloth  6 each Topical Daily  . feeding supplement (ENSURE ENLIVE)  237 mL Oral BID BM  . guaiFENesin  600 mg Oral BID  . levothyroxine  75 mcg Oral Daily  . loratadine  10 mg Oral Daily  . mouth rinse  15 mL Mouth Rinse BID  . metoprolol succinate  25 mg Oral Daily  . mometasone-formoterol  2 puff Inhalation BID  . pantoprazole  40 mg Oral Q0600  . polyethylene glycol  17 g Oral Daily  . sodium chloride flush  10-40 mL Intracatheter Q12H  . sodium chloride flush  3 mL Intravenous Q12H  . sodium chloride flush  3 mL Intravenous Q12H  . sodium chloride flush  3 mL Intravenous Q12H  . ticagrelor  90 mg Oral BID   Continuous Infusions: . sodium chloride Stopped (05/17/19 2000)  . dexmedetomidine (PRECEDEX) IV infusion Stopped (05/16/19 0746)  . heparin 800 Units/hr (05/21/19 0700)   PRN Meds: sodium chloride, acetaminophen, albuterol, diphenhydrAMINE-zinc acetate, morphine injection, ondansetron **OR** ondansetron (ZOFRAN) IV, oxyCODONE, sodium chloride flush, sodium chloride flush, traMADol, traZODone   Vital Signs    Vitals:   05/21/19 0600 05/21/19 0700 05/21/19 0730 05/21/19 0754  BP: (!) 120/49 (!) 131/55    Pulse: 60 (!) 57 61   Resp: (!) 0 19 11   Temp:    98.6 F (37 C)  TempSrc:    Oral  SpO2: 100% 100% 100%   Weight:      Height:        Intake/Output Summary (Last 24 hours) at 05/21/2019 0938 Last data filed at 05/21/2019 0700 Gross per 24 hour  Intake 394.43 ml  Output 550 ml  Net  -155.57 ml   Last 3 Weights 05/20/2019 05/19/2019 05/18/2019  Weight (lbs) 139 lb 5.3 oz 139 lb 15.9 oz 141 lb 15.6 oz  Weight (kg) 63.2 kg 63.5 kg 64.4 kg      Telemetry   In and out of atrial fibrillation.  Post-conversion pauses. - Personally Reviewed  ECG    05/17/19: Atrial fibrillation. Rate 143 bpm.  - Personally Reviewed  Physical Exam   VS:  BP (!) 131/55   Pulse 61   Temp 98.6 F (37 C) (Oral)   Resp 11   Ht  (1.549 m)   Wt 63.2 kg   SpO2 100%   BMI 26.33 kg/m  , BMI Body mass index is 26.33 kg/m. GENERAL:  Ill-appearing.  No acute distress HEENT: Pupils equal round and reactive, fundi not visualized, oral mucosa unremarkable NECK:  No jugular venous distention, waveform within normal limits, carotid upstroke brisk and symmetric, no bruits LUNGS:  Clear to auscultation bilaterally HEART:  RRR.  PMI not displaced or sustained,S1 and S2 within normal limits, no S3, no S4, no clicks, no rubs,  no murmurs CHEST: Midline incision C/D/I.  No erythema ABD:  Flat, positive bowel sounds normal in frequency in pitch, no bruits, no rebound, no guarding,  no midline pulsatile mass, no hepatomegaly, no splenomegaly EXT:  2 plus pulses throughout, 1+ bilateral UE edema, no cyanosis no clubbing SKIN:  Large L arm ecchymosis NEURO:  Cranial nerves II through XII grossly intact, motor grossly intact throughout PSYCH:  Cognitively intact, oriented to person place and time   Labs    High Sensitivity Troponin:   Recent Labs  Lab 05/10/19 1303 05/10/19 1527 05/10/19 1906 05/12/19 0653 05/12/19 0908  TROPONINIHS 44* 499* 975* 1,563* 1,288*      Chemistry Recent Labs  Lab 05/19/19 0434 05/20/19 0326 05/21/19 0441  NA 139 137 137  K 3.5 4.2 3.8  CL 105 105 101  CO2 26 27 26   GLUCOSE 121* 103* 88  BUN 21 17 15   CREATININE 0.78 0.72 0.84  CALCIUM 7.9* 7.9* 7.8*  GFRNONAA >60 >60 >60  GFRAA >60 >60 >60  ANIONGAP 8 5 10      Hematology Recent Labs  Lab 05/19/19  0434 05/20/19 0326 05/21/19 0441  WBC 10.6* 9.7 10.8*  RBC 3.19* 3.23* 3.13*  HGB 9.5* 9.6* 9.1*  HCT 29.5* 30.0* 29.8*  MCV 92.5 92.9 95.2  MCH 29.8 29.7 29.1  MCHC 32.2 32.0 30.5  RDW 17.0* 17.1* 17.1*  PLT 121* 144* 171    BNP No results for input(s): BNP, PROBNP in the last 168 hours.   DDimer No results for input(s): DDIMER in the last 168 hours.   Radiology    No results found.  Cardiac Studies   Echo 05/16/19: 1. Left ventricular ejection fraction, by visual estimation, is 55 to 60%. The left ventricle has normal function. Left ventricular septal wall thickness was mildly increased. There is mildly increased left ventricular hypertrophy. 2. Inferior basal hypokinesis but overal EF preserved. 3. Global right ventricle has normal systolic function.The right ventricular size is normal. No increase in right ventricular wall thickness. 4. Left atrial size was normal. 5. Right atrial size was mildly dilated. 6. Moderate calcification of the mitral valve leaflet(s). 7. Moderate mitral annular calcification. 8. Moderate thickening of the mitral valve leaflet(s). 9. The mitral valve is normal in structure. No evidence of mitral valve regurgitation. No evidence of mitral stenosis. 10. The tricuspid valve is normal in structure. Tricuspid valve regurgitation is mild. 11. The aortic valve is tricuspid Aortic valve regurgitation was not visualized by color flow Doppler. Mild to moderate aortic valve sclerosis/calcification without any evidence of aortic stenosis. 12. The pulmonic valve was not well visualized. Pulmonic valve regurgitation was not assessed by color flow Doppler. 13. Post pericardial window with no tamponade or residual effuson. 14. The interatrial septum was not well visualized.  Patient Profile     85 y.o. female with COPD, hypertension, and GERD admitted with COPD exacerbation with hypoxic respiratory failure and pneumonia.  She was then found to have  NSTEMI and a left heart cath had a proximal RCA stenosis.  The intervention was complicated by perforation with tamponade and hemopericardium.  She was taken to the OR 9/29 for pericardial washout.  Course has been complicated by postoperative atrial fibrillation.  Assessment & Plan    # NSTEMI: High-sensitivity troponin was 1500.  She underwent PCI of the proximal RCA.  However this was complicated by perforation as above.  Continue aspirin, ticagrelor, atorvastatin, and metoprolol.   # Acute systolic and diastolic heart failure: Initial echo revealed LVEF 45 to 50% with RCA region wall motion abnormalities.  This improved post PCI.  # Atrial fibrillation with RVR: # SSS: She had atrial  fibrillation post operatively and off and on since then.  Episodes seem to be shorter.  HR in the 100s-110s when in atrial fibrillation. Now on oral amiodarone load.  She is currently in sinus rhythm.  She continues to have  postconversion pauses though now around 2s instead of 3.7 seconds.  EP following.  No PPM for now.  WIll discuss anticoagulation/anitplatelet strategy with Dr. Ellyn Hack.  # COPD/Hypoxic respiratory failure: She is now off supplemental oxygen. Completed 5 days of steroids/antibiotics.  Dulera with prn albuterol as recommended by critical care  # Acute blood loss anemia: Hemopericardium 2/2 coronary perforation.  She received 3u pRBB on 9/29.  H/h remains stable.   # Thrombocytopenia: Likely related to surgery and critical illness.  # Essential hypertension: Home benazepril is on hold.   # Deconditioning: She is very weak and tired.  Will need PT and I suspect rehab at discharge.   Time spent: 68minutes-Greater than 50% of this time was spent in counseling, explanation of diagnosis, planning of further management, and coordination of care.      For questions or updates, please contact [place] Please consult [email or web address] for contact info under        Signed, Skeet Latch, MD  05/21/2019, 9:38 AM     Progress Note  Patient Name: IMAJEAN MCDERMID Date of Encounter: 05/21/2019  Primary Cardiologist: Evalina Field, MD  Subjective   Went back into afib so amiodarone was restarted.  She was unaware when she was in atrial fibrillation.  Her only complaint this morning is feeling weak and tired.  Inpatient Medications    Scheduled Meds: . amiodarone  400 mg Oral BID   Followed by  . [START ON 05/25/2019] amiodarone  200 mg Oral Daily  . aspirin  81 mg Oral Once  . atorvastatin  40 mg Oral q1800  . Chlorhexidine Gluconate Cloth  6 each Topical Daily  . feeding supplement (ENSURE ENLIVE)  237 mL Oral BID BM  . guaiFENesin  600 mg Oral BID  . levothyroxine  75 mcg Oral Daily  . loratadine  10 mg Oral Daily  . mouth rinse  15 mL Mouth Rinse BID  . metoprolol succinate  25 mg Oral Daily  . mometasone-formoterol  2 puff Inhalation BID  . pantoprazole  40 mg Oral Q0600  . polyethylene glycol  17 g Oral Daily  . sodium chloride flush  10-40 mL Intracatheter Q12H  . sodium chloride flush  3 mL Intravenous Q12H  . sodium chloride flush  3 mL Intravenous Q12H  . sodium chloride flush  3 mL Intravenous Q12H  . ticagrelor  90 mg Oral BID   Continuous Infusions: . sodium chloride Stopped (05/17/19 2000)  . dexmedetomidine (PRECEDEX) IV infusion Stopped (05/16/19 0746)  . heparin 800 Units/hr (05/21/19 0700)   PRN Meds: sodium chloride, acetaminophen, albuterol, diphenhydrAMINE-zinc acetate, morphine injection, ondansetron **OR** ondansetron (ZOFRAN) IV, oxyCODONE, sodium chloride flush, sodium chloride flush, traMADol, traZODone   Vital Signs    Vitals:   05/21/19 0600 05/21/19 0700 05/21/19 0730 05/21/19 0754  BP: (!) 120/49 (!) 131/55    Pulse: 60 (!) 57 61   Resp: (!) 0 19 11   Temp:    98.6 F (37 C)  TempSrc:    Oral  SpO2: 100% 100% 100%   Weight:      Height:        Intake/Output Summary (Last 24 hours) at 05/21/2019 6269 Last data  filed at 05/21/2019 0700 Gross  per 24 hour  Intake 394.43 ml  Output 550 ml  Net -155.57 ml   Last 3 Weights 05/20/2019 05/19/2019 05/18/2019  Weight (lbs) 139 lb 5.3 oz 139 lb 15.9 oz 141 lb 15.6 oz  Weight (kg) 63.2 kg 63.5 kg 64.4 kg      Telemetry    Currently sinus rhythm/sinus arrhythmia in the 60s.  Previously in atrial fibrillation in the 120s.  Postconversion pauses up to 3.46 seconds.- Personally Reviewed  ECG    05/17/19: Atrial fibrillation. Rate 143 bpm.  - Personally Reviewed  Physical Exam   VS:  BP (!) 131/55   Pulse 61   Temp 98.6 F (37 C) (Oral)   Resp 11   Ht 5\' 1"  (1.549 m)   Wt 63.2 kg   SpO2 100%   BMI 26.33 kg/m  , BMI Body mass index is 26.33 kg/m. GENERAL:  Ill-appearing.  No acute distress HEENT: Pupils equal round and reactive, fundi not visualized, oral mucosa unremarkable NECK:  No jugular venous distention, waveform within normal limits, carotid upstroke brisk and symmetric, no bruits LUNGS:  Clear to auscultation bilaterally HEART:  RRR.  PMI not displaced or sustained,S1 and S2 within normal limits, no S3, no S4, no clicks, no rubs,  no murmurs CHEST: Midline incision C/D/I.  No erythema ABD:  Flat, positive bowel sounds normal in frequency in pitch, no bruits, no rebound, no guarding, no midline pulsatile mass, no hepatomegaly, no splenomegaly EXT:  2 plus pulses throughout, no edema, no cyanosis no clubbing SKIN:  Large L arm ecchymosis NEURO:  Cranial nerves II through XII grossly intact, motor grossly intact throughout PSYCH:  Cognitively intact, oriented to person place and time   Labs    High Sensitivity Troponin:   Recent Labs  Lab 05/10/19 1303 05/10/19 1527 05/10/19 1906 05/12/19 0653 05/12/19 0908  TROPONINIHS 44* 499* 975* 1,563* 1,288*      Chemistry Recent Labs  Lab 05/19/19 0434 05/20/19 0326 05/21/19 0441  NA 139 137 137  K 3.5 4.2 3.8  CL 105 105 101  CO2 26 27 26   GLUCOSE 121* 103* 88  BUN 21 17 15    CREATININE 0.78 0.72 0.84  CALCIUM 7.9* 7.9* 7.8*  GFRNONAA >60 >60 >60  GFRAA >60 >60 >60  ANIONGAP 8 5 10      Hematology Recent Labs  Lab 05/19/19 0434 05/20/19 0326 05/21/19 0441  WBC 10.6* 9.7 10.8*  RBC 3.19* 3.23* 3.13*  HGB 9.5* 9.6* 9.1*  HCT 29.5* 30.0* 29.8*  MCV 92.5 92.9 95.2  MCH 29.8 29.7 29.1  MCHC 32.2 32.0 30.5  RDW 17.0* 17.1* 17.1*  PLT 121* 144* 171    BNP No results for input(s): BNP, PROBNP in the last 168 hours.   DDimer No results for input(s): DDIMER in the last 168 hours.   Radiology    No results found.  Cardiac Studies   Echo 05/16/19: 1. Left ventricular ejection fraction, by visual estimation, is 55 to 60%. The left ventricle has normal function. Left ventricular septal wall thickness was mildly increased. There is mildly increased left ventricular hypertrophy. 2. Inferior basal hypokinesis but overal EF preserved. 3. Global right ventricle has normal systolic function.The right ventricular size is normal. No increase in right ventricular wall thickness. 4. Left atrial size was normal. 5. Right atrial size was mildly dilated. 6. Moderate calcification of the mitral valve leaflet(s). 7. Moderate mitral annular calcification. 8. Moderate thickening of the mitral valve leaflet(s). 9. The mitral valve is normal  in structure. No evidence of mitral valve regurgitation. No evidence of mitral stenosis. 10. The tricuspid valve is normal in structure. Tricuspid valve regurgitation is mild. 11. The aortic valve is tricuspid Aortic valve regurgitation was not visualized by color flow Doppler. Mild to moderate aortic valve sclerosis/calcification without any evidence of aortic stenosis. 12. The pulmonic valve was not well visualized. Pulmonic valve regurgitation was not assessed by color flow Doppler. 13. Post pericardial window with no tamponade or residual effuson. 14. The interatrial septum was not well visualized.  Patient Profile      85 y.o. female with COPD, hypertension, and GERD admitted with COPD exacerbation with hypoxic respiratory failure and pneumonia.  She was then found to have NSTEMI and a left heart cath had a proximal RCA stenosis.  The intervention was complicated by perforation with tamponade and hemopericardium.  She was taken to the OR 9/29 for pericardial washout.  Course has been complicated by postoperative atrial fibrillation.  Assessment & Plan    # NSTEMI: High-sensitivity troponin was 1500.  She underwent PCI of the proximal RCA.  However this was complicated by perforation as above.  Continue aspirin, ticagrelor, atorvastatin, and metoprolol.   # Acute systolic and diastolic heart failure:  Initial echo revealed LVEF 45 to 50% with RCA region wall motion abnormalities.  This improved post PCI.  # Atrial fibrillation with RVR: She had atrial fibrillation post operatively and off and on since then.  Now on oral amiodarone load.  She is currently in sinus rhythm.  She does have long postconversion pauses up to 3.46 seconds.  So far she has been asymptomatic.  Would not further titrate her nodal agents. ?heparin   # COPD/Hypoxic respiratory failure: She is currently on 1 L supplemental oxygen.  She does not use oxygen at home.  Completed 5 days of steroids/antibiotics.  Dulera with prn albuterol as recommended by critical care  # Acute blood loss anemia: Hemopericardium 2/2 coronary perforation.  She received 3u pRBB on 9/29.  H/h remains stable.   # Thrombocytopenia: Likely related to surgery and critical illness.  # Essential hypertension: Home benazepril is on hold.   Time spent: 35 minutes-Greater than 50% of this time was spent in counseling, explanation of diagnosis, planning of further management, and coordination of care.      For questions or updates, please contact [facility] Please consult [email or web address] for contact info under        Signed, Chilton Si, MD  05/21/2019,  9:38 AM

## 2019-05-21 NOTE — Progress Notes (Addendum)
[place] for heparin Indication: atrial fibrillation  Allergies  Allergen Reactions  . Crestor [Rosuvastatin Calcium] Other (See Comments)    Cramping/pain in lower extremeties  . Niaspan [Niacin Er] Other (See Comments)    Cramping/pain in lower extremeties    Patient Measurements: Height: 5\' 1"  (154.9 cm) Weight: 139 lb 5.3 oz (63.2 kg) IBW/kg (Calculated) : 47.8   Vital Signs: Temp: 98.6 F (37 C) (10/06 0754) Temp Source: Oral (10/06 0754) BP: 126/47 (10/06 0900) Pulse Rate: 67 (10/06 0900)  Labs: Recent Labs    05/19/19 0434  05/20/19 0326 05/20/19 0335 05/20/19 1152 05/21/19 0440 05/21/19 0441  HGB 9.5*  --  9.6*  --   --   --  9.1*  HCT 29.5*  --  30.0*  --   --   --  29.8*  PLT 121*  --  144*  --   --   --  171  HEPARINUNFRC  --    < >  --  0.19* 0.30 0.51  --   CREATININE 0.78  --  0.72  --   --   --  0.84   < > = values in this interval not displayed.    Estimated Creatinine Clearance: 38.7 mL/min (by C-G formula based on SCr of 0.84 mg/dL).   Medical History: Past Medical History:  Diagnosis Date  . Cataract   . COPD (chronic obstructive pulmonary disease) ([place])   . GERD (gastroesophageal reflux disease)   . Hypertension   . Osteopenia   . Thyroid disease     Assessment: 89yof s/p PCI to RCA complicated by hemopericardium > OR for washout and CABG x1 9/29.  Currently on DAPT for DES in RCA. Post op Afib noted and now on heparin. She is noted on brilinta -Heparin level 0.51   Goal of Therapy:  Heparin level 0.3-0.5 Monitor platelets by anticoagulation protocol: Yes   Plan:  -Decrease heparin to 750 unit/hr -Daily heparin level and CBC -Spoke with Dr. Oval Linsey: plans are to switch to plavix 10/7 in am then begin apixaban that evening  Thanks for allowing pharmacy to be a part of this patient's care.  Hildred Laser, PharmD Clinical Pharmacist **Pharmacist phone directory can now be found on  [email or web address] ([email or web address]).  Listed under [place].

## 2019-05-21 NOTE — Progress Notes (Addendum)
Electrophysiology Rounding Note  Patient Name: Amy Chambers Date of Encounter: 05/21/2019  Primary Cardiologist: Dr. Flora Lipps Electrophysiologist: New to Dr. Ladona Ridgel   Subjective   The patient is feeling better today.    Longest pause 2 seconds, and her Afib burden seems to be improving on amio load.   Inpatient Medications    Scheduled Meds: . amiodarone  400 mg Oral BID   Followed by  . [START ON 05/25/2019] amiodarone  200 mg Oral Daily  . aspirin  81 mg Oral Once  . atorvastatin  40 mg Oral q1800  . Chlorhexidine Gluconate Cloth  6 each Topical Daily  . guaiFENesin  600 mg Oral BID  . levothyroxine  75 mcg Oral Daily  . loratadine  10 mg Oral Daily  . mouth rinse  15 mL Mouth Rinse BID  . metoprolol succinate  25 mg Oral Daily  . mometasone-formoterol  2 puff Inhalation BID  . pantoprazole  40 mg Oral Q0600  . polyethylene glycol  17 g Oral Daily  . sodium chloride flush  10-40 mL Intracatheter Q12H  . sodium chloride flush  3 mL Intravenous Q12H  . sodium chloride flush  3 mL Intravenous Q12H  . sodium chloride flush  3 mL Intravenous Q12H  . ticagrelor  90 mg Oral BID   Continuous Infusions: . sodium chloride Stopped (05/17/19 2000)  . dexmedetomidine (PRECEDEX) IV infusion Stopped (05/16/19 0746)  . heparin 800 Units/hr (05/21/19 0700)   PRN Meds: sodium chloride, acetaminophen, albuterol, diphenhydrAMINE-zinc acetate, morphine injection, ondansetron **OR** ondansetron (ZOFRAN) IV, oxyCODONE, sodium chloride flush, sodium chloride flush, traMADol, traZODone   Vital Signs    Vitals:   05/21/19 0600 05/21/19 0700 05/21/19 0730 05/21/19 0754  BP: (!) 120/49 (!) 131/55    Pulse: 60 (!) 57 61   Resp: (!) 0 19 11   Temp:    98.6 F (37 C)  TempSrc:    Oral  SpO2: 100% 100% 100%   Weight:      Height:        Intake/Output Summary (Last 24 hours) at 05/21/2019 0825 Last data filed at 05/21/2019 0700 Gross per 24 hour  Intake 461.93 ml  Output 550 ml  Net  -88.07 ml   Filed Weights   05/18/19 0500 05/19/19 0500 05/20/19 0500  Weight: 64.4 kg 63.5 kg 63.2 kg    Physical Exam    GEN- The patient is elderly appearing, alert and oriented x 3 today.   Head- normocephalic, atraumatic Eyes-  Sclera clear, conjunctiva pink Ears- hearing intact Oropharynx- clear Neck- supple; LIJ TLC stable. Lungs- Clear to ausculation bilaterally, normal work of breathing Heart- Regular, slightly brady. no murmurs, rubs or gallops. Midline incision C/D/I GI- soft, NT, ND, + BS Extremities- no clubbing, cyanosis, or edema Skin- no rash or lesion Psych- euthymic mood, full affect Neuro- strength and sensation are intact  Labs    CBC Recent Labs    05/20/19 0326 05/21/19 0441  WBC 9.7 10.8*  HGB 9.6* 9.1*  HCT 30.0* 29.8*  MCV 92.9 95.2  PLT 144* 171   Basic Metabolic Panel Recent Labs    43/32/95 0326 05/21/19 0441  NA 137 137  K 4.2 3.8  CL 105 101  CO2 27 26  GLUCOSE 103* 88  BUN 17 15  CREATININE 0.72 0.84  CALCIUM 7.9* 7.8*   Liver Function Tests No results for input(s): AST, ALT, ALKPHOS, BILITOT, PROT, ALBUMIN in the last 72 hours. No results for input(s):  LIPASE, AMYLASE in the last 72 hours. Cardiac Enzymes No results for input(s): CKTOTAL, CKMB, CKMBINDEX, TROPONINI in the last 72 hours. BNP Invalid input(s): POCBNP D-Dimer No results for input(s): DDIMER in the last 72 hours. Hemoglobin A1C No results for input(s): HGBA1C in the last 72 hours. Fasting Lipid Panel No results for input(s): CHOL, HDL, LDLCALC, TRIG, CHOLHDL, LDLDIRECT in the last 72 hours. Thyroid Function Tests No results for input(s): TSH, T4TOTAL, T3FREE, THYROIDAB in the last 72 hours.  Invalid input(s): FREET3  Telemetry    NSR 50-60s since around 1600 yesterday. She was in and out of afib most of yesterday morning. (personally reviewed)  Radiology    No results found.   Patient Profile     Amy Chambers is a 85 y.o. female with a history  of COPD, HTN, and GERD who is being seen today for the evaluation of Afib RVR/tachybrady at the request of Dr. Oval Linsey.  Assessment & Plan    1.  Afib with RVR Continue oral amio load. Afib burden appears to be improving overall.  Continue toprol 25 mg daily for now, if sinus rate decreases or has longer pauses, would stop.  She has no immediate indication for pacemaker. We will follow at a distance.  Please call back with any questions.   2. NSTEMI S/p PCI of the proximal RCA -> complicated with perforation -> mediastinal washout.  TCTS following.  Per gen cards.   3. COPD/Resp failure Improved.   Her afib burden is decreasing, and longest pause over past 24 hrs has been ~ 2 seconds.  Pt has no immediate indication for pacemaker. We will follow at a distance, but please call with any questions.   Pt OK to move out of ICU and d/c central access from our perspective. Further per primary.   For questions or updates, please contact [place] Please consult [email or web address] for contact info under Cardiology/STEMI.  Signed, Shirley Friar, PA-C  05/21/2019, 8:25 AM   EP Attending  Patient seen and examined. Agree with the findings as noted above. I have reviewed her tele and note that her atrial fib is improving and her pauses are much better. I would suggest continuing her amiodarone at the current dose for another day or three and then reducing to 400 mg daily for a month then 200 mg daily. If she her HR and BP tolerate, continue the toprol but would stop if sinus pauses were to worsen.  Mikle Bosworth.D.

## 2019-05-21 NOTE — Progress Notes (Signed)
Nutrition Follow-up  DOCUMENTATION CODES:   Not applicable  INTERVENTION:   - Continue Ensure Enlive po BID, each supplement provides 350 kcal and 20 grams of protein  - Magic cup TID with meals, each supplement provides 290 kcal and 9 grams of protein  - Encourage adequate PO intake  NUTRITION DIAGNOSIS:   Inadequate oral intake related to inability to eat as evidenced by NPO status.  Progressing, pt now on 2 gram Sodium diet  GOAL:   Patient will meet greater than or equal to 90% of their needs  Progressing  MONITOR:   PO intake, Supplement acceptance, Labs, Weight trends, Skin, I & O's  REASON FOR ASSESSMENT:   Ventilator    ASSESSMENT:   85 year old female who presented to the ED on 9/25 with SOB. PMH of COPD, HTN, HLD, GERD. Pt admitted with NSTEMI.  9/29 - left heart cath, CT scan showing new pericardial effusion, s/p pericardial window 10/1 - extubated, diet advanced to 2 gram sodium  Discussed pt with RN and during ICU rounds. Per Dr. Lovena Le, pt with no immediate indication for pacemaker.  Weight up 13 lbs since admit. Pt is net positive 3.6 L. Pt with edema to BUE and BLE.  RD will continue with order for Ensure Enlive and order Magic Cup TID with meals as PO intake is poor.  Meal Completion: 0-50% x last 8 recorded meals  Medications reviewed and include: Ensure Enlive BID, Protonix, Miralax, heparin  Labs reviewed: hemoglobin 9.1  UOP: 550 ml x 24 hours I/O's: +3.6 L since admit  Diet Order:   Diet Order            Diet 2 gram sodium Room service appropriate? Yes; Fluid consistency: Thin  Diet effective now              EDUCATION NEEDS:   No education needs have been identified at this time  Skin:  Skin Assessment: Skin Integrity Issues: Skin Integrity Issues: Incisions: closed incision to chest  Last BM:  05/20/19  Height:   Ht Readings from Last 1 Encounters:  05/19/19 5\' 1"  (1.549 m)    Weight:   Wt Readings from Last  1 Encounters:  05/20/19 63.2 kg    Ideal Body Weight:  47.7 kg  BMI:  Body mass index is 26.33 kg/m.  Estimated Nutritional Needs:   Kcal:  1300-1500  Protein:  70-85 grams  Fluid:  >/= 1.3 L    Gaynell Face, MS, RD, LDN Inpatient Clinical Dietitian Pager: [phone] Weekend/After Hours: [phone]

## 2019-05-22 LAB — BASIC METABOLIC PANEL
Anion gap: 11 (ref 5–15)
BUN: 13 mg/dL (ref 8–23)
CO2: 24 mmol/L (ref 22–32)
Calcium: 8 mg/dL — ABNORMAL LOW (ref 8.9–10.3)
Chloride: 101 mmol/L (ref 98–111)
Creatinine, Ser: 0.71 mg/dL (ref 0.44–1.00)
GFR calc Af Amer: >60 mL/min (ref >60)
GFR calc non Af Amer: >60 mL/min (ref >60)
Glucose, Bld: 101 mg/dL — ABNORMAL HIGH (ref 70–99)
Potassium: 3.9 mmol/L (ref 3.5–5.1)
Sodium: 136 mmol/L (ref 135–145)

## 2019-05-22 LAB — CBC
HCT: 28.9 % — ABNORMAL LOW (ref 36.0–46.0)
Hemoglobin: 9.2 g/dL — ABNORMAL LOW (ref 12.0–15.0)
MCH: 29.6 pg (ref 26.0–34.0)
MCHC: 31.8 g/dL (ref 30.0–36.0)
MCV: 92.9 fL (ref 80.0–100.0)
Platelets: 165 10*3/uL (ref 150–400)
RBC: 3.11 MIL/uL — ABNORMAL LOW (ref 3.87–5.11)
RDW: 17.2 % — ABNORMAL HIGH (ref 11.5–15.5)
WBC: 14.7 10*3/uL — ABNORMAL HIGH (ref 4.0–10.5)
nRBC: 0 % (ref 0.0–0.2)

## 2019-05-22 LAB — HEPARIN LEVEL (UNFRACTIONATED): Heparin Unfractionated: 0.26 IU/mL — ABNORMAL LOW (ref 0.30–0.70)

## 2019-05-22 MED ORDER — APIXABAN 2.5 MG PO TABS
2.5000 mg | ORAL_TABLET | Freq: Two times a day (BID) | ORAL | Status: DC
  Administered 2019-05-22 – 2019-05-31 (×18): 2.5 mg via ORAL
  Filled 2019-05-22 (×19): qty 1

## 2019-05-22 MED ORDER — AMIODARONE HCL 200 MG PO TABS: 400.0000 mg | ORAL_TABLET | Freq: Every day | ORAL | Status: DC

## 2019-05-22 MED ORDER — FUROSEMIDE 10 MG/ML IJ SOLN
20.0000 mg | Freq: Two times a day (BID) | INTRAMUSCULAR | Status: AC
  Administered 2019-05-22 – 2019-05-23 (×2): 20 mg via INTRAVENOUS
  Filled 2019-05-22 (×2): qty 2

## 2019-05-22 MED ORDER — AMIODARONE HCL 200 MG PO TABS
400.0000 mg | ORAL_TABLET | Freq: Two times a day (BID) | ORAL | Status: AC
  Administered 2019-05-22 – 2019-05-24 (×4): 400 mg via ORAL
  Filled 2019-05-22 (×4): qty 2

## 2019-05-22 NOTE — Progress Notes (Addendum)
Pharmacy note: brilinta to plavix  Spoke with Dr. Oval Linsey on 10/6. Plans will be to stop brilinta after the pm dose on 10/6  Plan -stop brilinta on 10/6 -Plavix 300mg  po x1 on 10/7 then begin 75mg  po daily on 10/8 -Likely change to apixaban on 10/7 in the evening  Hildred Laser, PharmD Clinical Pharmacist **Pharmacist phone directory can now be found on [email or web address] (PW [email or web address]).  Listed under [place].

## 2019-05-22 NOTE — Progress Notes (Signed)
Transitions of Care Pharmacist Note  Amy Chambers is a 85 y.o. female that has been diagnosed with atrial fibrillation and will be prescribed Eliquis (apixaban) at discharge.   Patient Education: I provided the following education on 05/22/19 to the patient: How to take the medication Described what the medication is Signs of bleeding Signs/symptoms of VTE and stroke  Answered their questions  Discharge Medications Plan: The patient wants to have their discharge medications filled by the Transitions of Care pharmacy rather than their usual pharmacy.  The primary doctor for this patient has been contacted to send all discharge medication prescriptions to the Transitions of Care pharmacy, the discharge orders pharmacy has been changed to the Transitions of Care pharmacy, the patient will receive a phone call regarding co-pay, and their medications will be delivered by the Transitions of Care pharmacy.   Insurance information: BIN: [id number] PCN: [id number] Grp: [id number] Member: [id number]  Thank you,   Agnes Lawrence, PharmD PGY1 Pharmacy Resident May 22, 2019

## 2019-05-22 NOTE — Progress Notes (Signed)
Physical Therapy Treatment Patient Details Name: Amy Chambers MRN: [id number] DOB: Mar 28, 1928 Today's Date: 05/22/2019    History of Present Illness 85 yo female with onset of hypoxia, hypercapnia and acute respiratory failure was admitted, may have been a medication reaction.  Cleared for PE, Covid (-).  PMHx:  thyroid disease, COPD, HTN, atherosclerosis carotids, bronchitis, underwent cath on 9/29 and developed post procedure hypotension found to have pericardial effusion with concern for tampanade due to coronary bleed.  Underwent emergent sternotomy, and evacuation of pericardial effusion.  Extubated 10/1.    PT Comments    Patient seen for mobility progression. Pt requires mod-mod A +2 for mobility this session. Pt tolerated ~20 ft total gait training distance and limited by 2/4 DOE. SpO2 and HR WNL with 2-3L O2 via [place]. Continue to progress as tolerated.     Follow Up Recommendations  CIR     Equipment Recommendations  Other (comment)(TBA)    Recommendations for Other Services       Precautions / Restrictions Precautions Precautions: Sternal;Fall Restrictions Weight Bearing Restrictions: No    Mobility  Bed Mobility Overal bed mobility: Needs Assistance Bed Mobility: Rolling;Sidelying to Sit Rolling: Mod assist Sidelying to sit: Mod assist;+2 for physical assistance       General bed mobility comments: cues for sequencing and technique; assist to bring hips to EOB and to elevate trunk into sitting  Transfers Overall transfer level: Needs assistance Equipment used: 2 person hand held assist Transfers: Sit to/from Stand Sit to Stand: Mod assist;+2 safety/equipment         General transfer comment: cues for hand placement/maintaining sternal precautions; assist to power up into standing and to steady in standing  Ambulation/Gait Ambulation/Gait assistance: +2 safety/equipment;Mod assist;+2 physical assistance Gait Distance (Feet): (~20 ft total ) Assistive  device: 2 person hand held assist Gait Pattern/deviations: Step-through pattern;Decreased stride length Gait velocity: decreased   General Gait Details: assistance required for balance; cues for upright posture    Stairs             Wheelchair Mobility    Modified Rankin (Stroke Patients Only)       Balance Overall balance assessment: Needs assistance Sitting-balance support: No upper extremity supported Sitting balance-Leahy Scale: Fair     Standing balance support: Bilateral upper extremity supported Standing balance-Leahy Scale: Poor                              Cognition Arousal/Alertness: Awake/alert Behavior During Therapy: WFL for tasks assessed/performed Overall Cognitive Status: Impaired/Different from baseline Area of Impairment: Memory                     Memory: Decreased recall of precautions         General Comments: pt needs max cues to maintain sternal precautions during functional mobility      Exercises      General Comments General comments (skin integrity, edema, etc.): pt with 2/4 DOE; 2-3L supplemental O2 via [place] required during session; SpO2 and HR WNL      Pertinent Vitals/Pain Pain Assessment: Faces Faces Pain Scale: Hurts little more Pain Location: chest Pain Descriptors / Indicators: Grimacing;Aching;Guarding Pain Intervention(s): Limited activity within patient's tolerance;Monitored during session;Repositioned    Home Living                      Prior Function  PT Goals (current goals can now be found in the care plan section) Progress towards PT goals: Progressing toward goals    Frequency    Min 3X/week      PT Plan Current plan remains appropriate    Co-evaluation PT/OT/SLP Co-Evaluation/Treatment: Yes Reason for Co-Treatment: Complexity of the patient's impairments (multi-system involvement);For patient/therapist safety;To address functional/ADL transfers PT goals  addressed during session: Mobility/safety with mobility        AM-PAC PT "6 Clicks" Mobility   Outcome Measure  Help needed turning from your back to your side while in a flat bed without using bedrails?: A Lot Help needed moving from lying on your back to sitting on the side of a flat bed without using bedrails?: A Lot Help needed moving to and from a bed to a chair (including a wheelchair)?: A Little Help needed standing up from a chair using your arms (e.g., wheelchair or bedside chair)?: A Little Help needed to walk in hospital room?: A Little Help needed climbing 3-5 steps with a railing? : A Lot 6 Click Score: 15    End of Session Equipment Utilized During Treatment: Oxygen;Gait belt Activity Tolerance: Patient limited by fatigue;Other (comment)(dizziness and SOB) Patient left: with call bell/phone within reach;in chair;with family/visitor present Nurse Communication: Mobility status;Precautions PT Visit Diagnosis: Other abnormalities of gait and mobility (R26.89);Muscle weakness (generalized) (M62.81)     Time: 4854-6270 PT Time Calculation (min) (ACUTE ONLY): 39 min  Charges:  $Gait Training: 23-37 mins                     Earney Navy, PTA Acute Rehabilitation Services Pager: [phone] Office: [phone]     Darliss Cheney 05/22/2019, 4:45 PM

## 2019-05-22 NOTE — Progress Notes (Signed)
Progress Note  Patient Name: Amy Chambers Date of Encounter: 05/22/2019  Primary Cardiologist: Reatha Harps, MD   Subjective   Feeling week and tired.   Inpatient Medications    Scheduled Meds: . amiodarone  400 mg Oral BID   Followed by  . [START ON 05/25/2019] amiodarone  200 mg Oral Daily  . aspirin  81 mg Oral Once  . atorvastatin  40 mg Oral q1800  . [START ON 05/23/2019] clopidogrel  75 mg Oral Daily  . feeding supplement (ENSURE ENLIVE)  237 mL Oral BID BM  . guaiFENesin  600 mg Oral BID  . levothyroxine  75 mcg Oral Daily  . loratadine  10 mg Oral Daily  . mouth rinse  15 mL Mouth Rinse BID  . metoprolol succinate  25 mg Oral Daily  . mometasone-formoterol  2 puff Inhalation BID  . pantoprazole  40 mg Oral Q0600  . polyethylene glycol  17 g Oral Daily  . sodium chloride flush  3 mL Intravenous Q12H   Continuous Infusions: . sodium chloride Stopped (05/17/19 2000)  . heparin 750 Units/hr (05/22/19 0623)   PRN Meds: sodium chloride, acetaminophen, albuterol, diphenhydrAMINE-zinc acetate, morphine injection, ondansetron **OR** ondansetron (ZOFRAN) IV, oxyCODONE, sodium chloride flush, traMADol, traZODone   Vital Signs    Vitals:   05/21/19 1959 05/22/19 0003 05/22/19 0426 05/22/19 0829  BP: (!) 109/59 119/66 (!) 144/63 (!) 130/58  Pulse: (!) 53 (!) 56 (!) 56 66  Resp: 18 20 18 18   Temp: 97.6 F (36.4 C) 97.6 F (36.4 C) 98.2 F (36.8 C) 98 F (36.7 C)  TempSrc:  Oral Oral Oral  SpO2: 100% 100% 100% 100%  Weight:  58.7 kg    Height:        Intake/Output Summary (Last 24 hours) at 05/22/2019 1005 Last data filed at 05/22/2019 0700 Gross per 24 hour  Intake 795.62 ml  Output 800 ml  Net -4.38 ml   Last 3 Weights 05/22/2019 05/20/2019 05/19/2019  Weight (lbs) 129 lb 6.6 oz 139 lb 5.3 oz 139 lb 15.9 oz  Weight (kg) 58.7 kg 63.2 kg 63.5 kg      Telemetry    Sinus rhythm at rate of 50-60s - Personally Reviewed  ECG    N/A  Physical Exam    GEN: Elderly thin frail ill appearing female in no acute distress.   Neck: No JVD Cardiac: RRR, no murmurs, rubs, or gallops.  Respiratory: Clear to auscultation bilaterally. GI: Soft, nontender, non-distended  MS: RLE with mild edema and cold to touch; No deformity. Neuro:  Nonfocal  Psych: Normal affect   Labs    High Sensitivity Troponin:   Recent Labs  Lab 05/10/19 1303 05/10/19 1527 05/10/19 1906 05/12/19 0653 05/12/19 0908  TROPONINIHS 44* 499* 975* 1,563* 1,288*      Chemistry Recent Labs  Lab 05/20/19 0326 05/21/19 0441 05/22/19 0346  NA 137 137 136  K 4.2 3.8 3.9  CL 105 101 101  CO2 27 26 24   GLUCOSE 103* 88 101*  BUN 17 15 13   CREATININE 0.72 0.84 0.71  CALCIUM 7.9* 7.8* 8.0*  GFRNONAA >60 >60 >60  GFRAA >60 >60 >60  ANIONGAP 5 10 11      Hematology Recent Labs  Lab 05/20/19 0326 05/21/19 0441 05/22/19 0346  WBC 9.7 10.8* 14.7*  RBC 3.23* 3.13* 3.11*  HGB 9.6* 9.1* 9.2*  HCT 30.0* 29.8* 28.9*  MCV 92.9 95.2 92.9  MCH 29.7 29.1 29.6  MCHC 32.0  30.5 31.8  RDW 17.1* 17.1* 17.2*  PLT 144* 171 165     Radiology    No results found.  Cardiac Studies   Echo 05/16/2019 . Left ventricular ejection fraction, by visual estimation, is 55 to 60%. The left ventricle has normal function. Left ventricular septal wall thickness was mildly increased. There is mildly increased left ventricular hypertrophy.  2. Inferior basal hypokinesis but overal EF preserved.  3. Global right ventricle has normal systolic function.The right ventricular size is normal. No increase in right ventricular wall thickness.  4. Left atrial size was normal.  5. Right atrial size was mildly dilated.  6. Moderate calcification of the mitral valve leaflet(s).  7. Moderate mitral annular calcification.  8. Moderate thickening of the mitral valve leaflet(s).  9. The mitral valve is normal in structure. No evidence of mitral valve regurgitation. No evidence of mitral stenosis. 10.  The tricuspid valve is normal in structure. Tricuspid valve regurgitation is mild. 11. The aortic valve is tricuspid Aortic valve regurgitation was not visualized by color flow Doppler. Mild to moderate aortic valve sclerosis/calcification without any evidence of aortic stenosis. 12. The pulmonic valve was not well visualized. Pulmonic valve regurgitation was not assessed by color flow Doppler. 13. Post pericardial window with no tamponade or residual effuson. 14. The interatrial septum was not well visualized.  LEFT HEART CATH AND CORONARY ANGIOGRAPHY  05/14/2019  Conclusion    No evidence of extravasation noted. Late venous phase contrast filling of what appears to be the right atrium noted, cannot exclude AV fistula filling the pericardium however.  Ost RCA lesion is 55% stenosed. Prior to the stent  Previously placed Prox RCA stent (drug-eluting) is widely patent.  Previously placed Prox RCA to Mid RCA stent (drug-eluting) is widely patent.  LV end diastolic pressure is normal.   SUMMARY  No obvious evidence of extravasation from the PCI site  Upstream 50% focal lesion (proximal from stent)  Relatively normal LVEDP with mild systemic hypotension on pressors  Likely metabolic acidosis with initial pH of 7.21 -> was given 1 amp of bicarb   RECOMMENDATION  The patient will go from Cath Lab to the OR for likely anterior approach pericardial window and VATS (will likely need to explore for source of bleed)  I have written to hold Brilinta.  Once stable postop, would need to restart  Would for now treat the proximal pre-stent stenosis medically => desire to avoid further intervention at this time.   CORONARY STENT INTERVENTION   05/14/2019  LEFT HEART CATH AND CORONARY ANGIOGRAPHY  Conclusion    Prox RCA lesion is 30% stenosed.  Prox RCA to Mid RCA lesion is 80% stenosed.  1st Mrg lesion is 40% stenosed.  Mid Cx lesion is 20% stenosed.  Mid LAD lesion is 20%  stenosed.  Dist RCA lesion is 20% stenosed.  A stent was successfully placed.  Post intervention, there is a 0% residual stenosis.  Post intervention, there is a 0% residual stenosis.  Mid RCA to Dist RCA lesion is 20% stenosed.  LV end diastolic pressure is moderately elevated.   Coronary artery obstructive disease with mild coronary calcification.  The LAD has 20% mild stenosis in its proximal to mid segment and dips intramyocardially in the mid LAD without systolic muscle bridging; there is mild 20% proximal circumflex stenosis with 40% narrowing and a small first marginal branch ostially.  RCA is a very large dominant vessel with coronary calcification and sharp angulation proximally and in its  mid segment.  There is 70 -80% stenosis proximal to mid segment with TIMI-3 flow and mild 20% distal RCA stenoses.  LV dysfunction with EF estimate at 50% with mid to basal inferior hypocontractility.  LVEDP elevated at 28 mm.  Very difficult but successful percutaneous coronary intervention involving the calcified sharp angled proximal to mid RCA with ultimate PTCA, and tandem stenting necessitating Guideliner support ultimate insertion of a 3.5 x 20 mm Synergy and 3.0 x 12 mm Synergy stents postdilated to 3.6 to 3.5 mm with the region being reduced to 0%.  RECOMMENDATION: DAPT for a minimum of 12 months.  Aggressive lipid-lowering therapy with target LDL less than 70.  Optimal blood pressure control with target blood pressure less than 130/80.      Patient Profile     85 y.o. female with COPD, hypertension, and GERD admitted with COPD exacerbation with hypoxic respiratory failure and pneumonia.  She was then found to have NSTEMI and a left heart cath had a proximal RCA stenosis.  The intervention was complicated by perforation with tamponade and hemopericardium.  She was taken to the OR 9/29 for pericardial washout.  Course has been complicated by postoperative atrial fibrillation.   Assessment & Plan    1. NSTEMI - Hs-troponin 1500. S/p PCI with DES to [facility] however complicated by perforation s/p mediastinal Exploration with Washout. Mild chest pain with movement.  - Brillinta changed to Plavix today (got loading dose). Stop ASA in 1 month due to need for anticoagulation.  - Continue Toprol and statin.   2. Atrial fibrillation with tachybrady syndrome - had post-op afib then had intermittent afib. Seen by EP. Appreciate recommendations. Had post conversion up to 3.7 sec.  - Tolerating amiodarone> No recurrent pause or amiodarone overnight.  -  Per Dr. Lovena Le "continuing her amiodarone at the current dose for another day or three and then reducing to 400 mg daily for a month then 200 mg daily. If she her HR and BP tolerate, continue the toprol but would stop if sinus pauses were to worsen".  - On Heparin for anticoagulation>> consider switch to Eliquis.   3. COPD/hypoxic respiratory failure - Completed 5 days of steroids/antibiotics.  - No active wheezing - Was on oxygen during my evaluation   4. Acute blood loss anemia Hemopericardium 2/2 coronary perforation.  She received 3u pRBB on 9/29.  H/h remains stable.    5. Deconditioning - PT recommended CIR - Remains weak and tried.  - Will get social and case worker help for transition  For questions or updates, please contact [place] Please consult [email or web address] for contact info under        SignedLeanor Kail, PA  05/22/2019, 10:05 AM

## 2019-05-22 NOTE — Care Management Important Message (Signed)
Important Message  Patient Details  Name: RACINE ERBY MRN: [id number] Date of Birth: [birth year]   Medicare Important Message Given:  Yes     Shelda Altes 05/22/2019, 1:50 PM

## 2019-05-22 NOTE — Progress Notes (Addendum)
[place] for heparin Indication: atrial fibrillation  Allergies  Allergen Reactions  . Crestor [Rosuvastatin Calcium] Other (See Comments)    Cramping/pain in lower extremeties  . Niaspan [Niacin Er] Other (See Comments)    Cramping/pain in lower extremeties    Patient Measurements: Height: 5\' 1"  (154.9 cm) Weight: 129 lb 6.6 oz (58.7 kg) IBW/kg (Calculated) : 47.8   Vital Signs: Temp: 98 F (36.7 C) (10/07 0829) Temp Source: Oral (10/07 0829) BP: 130/58 (10/07 0829) Pulse Rate: 66 (10/07 0829)  Labs: Recent Labs    05/20/19 0326  05/20/19 1152 05/21/19 0440 05/21/19 0441 05/22/19 0346  HGB 9.6*  --   --   --  9.1* 9.2*  HCT 30.0*  --   --   --  29.8* 28.9*  PLT 144*  --   --   --  171 165  HEPARINUNFRC  --    < > 0.30 0.51  --  0.26*  CREATININE 0.72  --   --   --  0.84 0.71   < > = values in this interval not displayed.    Estimated Creatinine Clearance: 39.3 mL/min (by C-G formula based on SCr of 0.71 mg/dL).   Medical History: Past Medical History:  Diagnosis Date  . Cataract   . COPD (chronic obstructive pulmonary disease) ([place])   . GERD (gastroesophageal reflux disease)   . Hypertension   . Osteopenia   . Thyroid disease     Assessment: 89yof s/p PCI to RCA complicated by hemopericardium > OR for washout and CABG x1 9/29.  Currently on DAPT for DES in RCA. Post op Afib noted and now on heparin. Brilinta stopped 10/6. Now on Plavix.  On Heparin infusion for post op atrial fibrillation.   -Heparin level decreased to 0.26 on heparin drip 750 units/hr.  No issues with IV heparin infusion per RN.  RN noted bruising on patient.  hgb low/stable 9.2, pltc 165k   Goal of Therapy:  Heparin level 0.3-0.5 Monitor platelets by anticoagulation protocol: Yes   Plan:  Increase heparin to 800 unit/hr to target HL 0.3-0.5 Check 6 hr heparin level -Daily heparin level and CBC  Thanks for allowing pharmacy to be a part of  this patient's care.  Hildred Laser, PharmD Clinical Pharmacist **Pharmacist phone directory can now be found on [email or web address] ([email or web address]).  Listed under [place].

## 2019-05-22 NOTE — Progress Notes (Signed)
Occupational Therapy Treatment Patient Details Name: Amy Chambers MRN: [id number] DOB: 11-14-1927 Today's Date: 05/22/2019    History of present illness 85 yo female with onset of hypoxia, hypercapnia and acute respiratory failure was admitted, may have been a medication reaction.  Cleared for PE, Covid (-).  PMHx:  thyroid disease, COPD, HTN, atherosclerosis carotids, bronchitis, underwent cath on 9/29 and developed post procedure hypotension found to have pericardial effusion with concern for tampanade due to coronary bleed.  Underwent emergent sternotomy, and evacuation of pericardial effusion.  Extubated 10/1.   OT comments  Pt limited by SOB (required 2-3L of supplemental O2 via  this session), Pt mod A +2 for bed mobility, mod  A+ 2 for toilet transfer with HHA, and max A for peri care. Pt education in sternal precautions but requires max cues to maintain. Pt leans heavily on BUE during grooming at sink. Pt's daughter present throughout session and encouraging mother to move as much as possible. Due to PLOF Pt continues to be good candidate for CIR - will need to monitor activity tolerance.    Follow Up Recommendations  CIR    Equipment Recommendations  Other (comment)(defer to next venue of care)    Recommendations for Other Services Other (comment)(Palliative)    Precautions / Restrictions Precautions Precautions: Sternal;Fall Precaution Booklet Issued: No Restrictions Weight Bearing Restrictions: No       Mobility Bed Mobility Overal bed mobility: Needs Assistance Bed Mobility: Rolling;Sidelying to Sit Rolling: Mod assist Sidelying to sit: Mod assist;+2 for physical assistance       General bed mobility comments: cues for sequencing and technique; assist to bring hips to EOB and to elevate trunk into sitting  Transfers Overall transfer level: Needs assistance Equipment used: 2 person hand held assist Transfers: Sit to/from Stand Sit to Stand: Mod assist;+2  safety/equipment         General transfer comment: cues for hand placement/maintaining sternal precautions; assist to power up into standing and to steady in standing    Balance Overall balance assessment: Needs assistance Sitting-balance support: No upper extremity supported Sitting balance-Leahy Scale: Fair     Standing balance support: Bilateral upper extremity supported Standing balance-Leahy Scale: Poor                             ADL either performed or assessed with clinical judgement   ADL Overall ADL's : Needs assistance/impaired     Grooming: Moderate assistance;Standing Grooming Details (indicate cue type and reason): leans heavily on BUE at sink - cues for sternal precautions             Lower Body Dressing: Maximal assistance Lower Body Dressing Details (indicate cue type and reason): to adjust socks Toilet Transfer: Moderate assistance;+2 for safety/equipment;Ambulation Toilet Transfer Details (indicate cue type and reason): HHA Toileting- Clothing Manipulation and Hygiene: Maximal assistance;Sit to/from stand Toileting - Clothing Manipulation Details (indicate cue type and reason): mod A to stand, and then max A for rear peri care in standing     Functional mobility during ADLs: Moderate assistance;+2 for safety/equipment       Vision       Perception     Praxis      Cognition Arousal/Alertness: Awake/alert Behavior During Therapy: WFL for tasks assessed/performed Overall Cognitive Status: Impaired/Different from baseline Area of Impairment: Memory                     Memory:  Decreased recall of precautions         General Comments: pt needs max cues to maintain sternal precautions during functional activity        Exercises     Shoulder Instructions       General Comments pt with 2/4 DOE; 2-3L supplemental O2 via [place] required during session; SpO2 and HR WNL    Pertinent Vitals/ Pain       Pain Assessment:  Faces Faces Pain Scale: Hurts little more Pain Location: chest Pain Descriptors / Indicators: Grimacing;Aching;Guarding Pain Intervention(s): Limited activity within patient's tolerance;Monitored during session;Repositioned  Home Living                                          Prior Functioning/Environment              Frequency  Min 2X/week        Progress Toward Goals  OT Goals(current goals can now be found in the care plan section)  Progress towards OT goals: Progressing toward goals(limited)  Acute Rehab OT Goals Patient Stated Goal: to get home and feel stronger OT Goal Formulation: With patient Time For Goal Achievement: 05/31/19 Potential to Achieve Goals: Good  Plan Discharge plan remains appropriate;Frequency remains appropriate    Co-evaluation    PT/OT/SLP Co-Evaluation/Treatment: Yes Reason for Co-Treatment: Complexity of the patient's impairments (multi-system involvement);For patient/therapist safety;To address functional/ADL transfers PT goals addressed during session: Mobility/safety with mobility;Balance;Proper use of DME OT goals addressed during session: ADL's and self-care      AM-PAC OT "6 Clicks" Daily Activity     Outcome Measure   Help from another person eating meals?: None Help from another person taking care of personal grooming?: A Lot Help from another person toileting, which includes using toliet, bedpan, or urinal?: A Lot Help from another person bathing (including washing, rinsing, drying)?: A Lot Help from another person to put on and taking off regular upper body clothing?: A Lot Help from another person to put on and taking off regular lower body clothing?: Total 6 Click Score: 13    End of Session Equipment Utilized During Treatment: Rolling walker;Oxygen(2-3L)  OT Visit Diagnosis: Unsteadiness on feet (R26.81);Other abnormalities of gait and mobility (R26.89);Muscle weakness (generalized)  (M62.81);Pain Pain - part of body: (sternal)   Activity Tolerance Patient tolerated treatment well   Patient Left in chair;with call bell/phone within reach;with chair alarm set;with family/visitor present   Nurse Communication Mobility status        Time: 8676-7209 OT Time Calculation (min): 29 min  Charges: OT General Charges $OT Visit: 1 Visit OT Treatments $Self Care/Home Management : 8-22 mins  Sherryl Manges OTR/L Acute Rehabilitation Services Pager: [phone] Office: [phone]   Evern Bio Harmonie Verrastro 05/22/2019, 6:57 PM

## 2019-05-22 NOTE — Progress Notes (Addendum)
[place] for heparin>> apixaban Indication: atrial fibrillation  Allergies  Allergen Reactions  . Crestor [Rosuvastatin Calcium] Other (See Comments)    Cramping/pain in lower extremeties  . Niaspan [Niacin Er] Other (See Comments)    Cramping/pain in lower extremeties    Patient Measurements: Height: 5\' 1"  (154.9 cm) Weight: 129 lb 6.6 oz (58.7 kg) IBW/kg (Calculated) : 47.8   Vital Signs: Temp: 98 F (36.7 C) (10/07 0829) Temp Source: Oral (10/07 0829) BP: 130/56 (10/07 1336) Pulse Rate: 52 (10/07 1336)  Labs: Recent Labs    05/20/19 0326  05/20/19 1152 05/21/19 0440 05/21/19 0441 05/22/19 0346  HGB 9.6*  --   --   --  9.1* 9.2*  HCT 30.0*  --   --   --  29.8* 28.9*  PLT 144*  --   --   --  171 165  HEPARINUNFRC  --    < > 0.30 0.51  --  0.26*  CREATININE 0.72  --   --   --  0.84 0.71   < > = values in this interval not displayed.    Estimated Creatinine Clearance: 39.3 mL/min (by C-G formula based on SCr of 0.71 mg/dL).   Medical History: Past Medical History:  Diagnosis Date  . Cataract   . COPD (chronic obstructive pulmonary disease) ([place])   . GERD (gastroesophageal reflux disease)   . Hypertension   . Osteopenia   . Thyroid disease     Assessment: 89yof s/p PCI to RCA complicated by hemopericardium > OR for washout and CABG x1 9/29.  Currently on DAPT for DES in RCA. Post op Afib noted and now on heparin. Brilinta stopped 10/6. Now on Plavix.  On Heparin infusion for post op atrial fibrillation.   Pharmacy to transition to apixaban -with wt of 58.kg, she qualifies for 2.5mg  po bid. She will be on apixaban and clopidogrel only (no ASA)   Goal of Therapy:  Heparin level 0.3-0.5 Monitor platelets by anticoagulation protocol: Yes   Plan: -discontinue heparin at 5pm and begin apixaban 2.5 mg po bid   Thanks for allowing pharmacy to be a part of this patient's care.  Hildred Laser, PharmD Clinical  Pharmacist **Pharmacist phone directory can now be found on [email or web address] ([email or web address]).  Listed under [place].

## 2019-05-23 LAB — BASIC METABOLIC PANEL
Anion gap: 11 (ref 5–15)
BUN: 13 mg/dL (ref 8–23)
CO2: 30 mmol/L (ref 22–32)
Calcium: 8.1 mg/dL — ABNORMAL LOW (ref 8.9–10.3)
Chloride: 98 mmol/L (ref 98–111)
Creatinine, Ser: 0.76 mg/dL (ref 0.44–1.00)
GFR calc Af Amer: >60 mL/min (ref >60)
GFR calc non Af Amer: >60 mL/min (ref >60)
Glucose, Bld: 84 mg/dL (ref 70–99)
Potassium: 3.6 mmol/L (ref 3.5–5.1)
Sodium: 139 mmol/L (ref 135–145)

## 2019-05-23 LAB — CBC
HCT: 31.3 % — ABNORMAL LOW (ref 36.0–46.0)
Hemoglobin: 9.6 g/dL — ABNORMAL LOW (ref 12.0–15.0)
MCH: 29.4 pg (ref 26.0–34.0)
MCHC: 30.7 g/dL (ref 30.0–36.0)
MCV: 95.7 fL (ref 80.0–100.0)
Platelets: 204 10*3/uL (ref 150–400)
RBC: 3.27 MIL/uL — ABNORMAL LOW (ref 3.87–5.11)
RDW: 17.6 % — ABNORMAL HIGH (ref 11.5–15.5)
WBC: 11.1 10*3/uL — ABNORMAL HIGH (ref 4.0–10.5)
nRBC: 0 % (ref 0.0–0.2)

## 2019-05-23 LAB — HEPARIN LEVEL (UNFRACTIONATED): Heparin Unfractionated: 1.28 IU/mL — ABNORMAL HIGH (ref 0.30–0.70)

## 2019-05-23 MED ORDER — FUROSEMIDE 20 MG PO TABS
20.0000 mg | ORAL_TABLET | Freq: Every day | ORAL | Status: DC
  Administered 2019-05-24: 20 mg via ORAL
  Filled 2019-05-23: qty 1

## 2019-05-23 MED ORDER — METOPROLOL SUCCINATE ER 25 MG PO TB24
12.5000 mg | ORAL_TABLET | Freq: Every day | ORAL | Status: DC
  Filled 2019-05-23: qty 1

## 2019-05-23 NOTE — Consult Note (Signed)
Inpatient Rehab Admissions:  Inpatient Rehab Consult received.  I met with pt and her daughter Adelina Mings at the bedside for rehabilitation assessment. I presented the pt and her daughter with brochures and information regarding goals and expectations of an IP Rehab stay. Feel pt would be able to tolerate the intensity of an IP Rehab program, if she wants to put in the effort. AC did discuss that because CIR is short term she will most likely need 24/7 Supervision at DC. Both pt and her daughter would like to discuss their options today with the rest of the family to see if they can provide the recommended caregiver assistance that would support a CIR stay. I will follow up with them this afternoon to see what post acute rehab venue they would like to pursue.   Please call if questions.   Jhonnie Garner, OTR/L  Rehab Admissions Coordinator  [phone] 05/23/2019 10:58 AM

## 2019-05-23 NOTE — Progress Notes (Signed)
Progress Note  Patient Name: Amy Chambers Date of Encounter: 05/23/2019  Primary Cardiologist: Evalina Field, MD  Subjective   Still feeling tired.  No specific complaints.   Inpatient Medications    Scheduled Meds: . [START ON 05/25/2019] amiodarone  400 mg Oral Daily   Followed by  . amiodarone  400 mg Oral BID  . apixaban  2.5 mg Oral BID  . atorvastatin  40 mg Oral q1800  . clopidogrel  75 mg Oral Daily  . feeding supplement (ENSURE ENLIVE)  237 mL Oral BID BM  . [START ON 05/24/2019] furosemide  20 mg Oral Daily  . guaiFENesin  600 mg Oral BID  . levothyroxine  75 mcg Oral Daily  . loratadine  10 mg Oral Daily  . mouth rinse  15 mL Mouth Rinse BID  . metoprolol succinate  25 mg Oral Daily  . mometasone-formoterol  2 puff Inhalation BID  . pantoprazole  40 mg Oral Q0600  . polyethylene glycol  17 g Oral Daily  . sodium chloride flush  3 mL Intravenous Q12H   Continuous Infusions: . sodium chloride Stopped (05/17/19 2000)   PRN Meds: sodium chloride, acetaminophen, albuterol, diphenhydrAMINE-zinc acetate, morphine injection, ondansetron **OR** ondansetron (ZOFRAN) IV, oxyCODONE, sodium chloride flush, traMADol, traZODone   Vital Signs    Vitals:   05/23/19 0740 05/23/19 0857 05/23/19 0925 05/23/19 1054  BP: (!) 149/66  (!) 116/47 (!) 121/49  Pulse: 67  66 68  Resp: 20  18   Temp: 98 F (36.7 C)  97.6 F (36.4 C)   TempSrc: Oral  Oral   SpO2: 100% 99% 100%   Weight:      Height:        Intake/Output Summary (Last 24 hours) at 05/23/2019 1106 Last data filed at 05/23/2019 0923 Gross per 24 hour  Intake 840 ml  Output 1775 ml  Net -935 ml   Last 3 Weights 05/23/2019 05/22/2019 05/20/2019  Weight (lbs) 127 lb 13.9 oz 129 lb 6.6 oz 139 lb 5.3 oz  Weight (kg) 58 kg 58.7 kg 63.2 kg      Telemetry   Sinus rhythm, sinus bradycardia.  Rate 50-60s. - Personally Reviewed  ECG    05/17/19: Atrial fibrillation. Rate 143 bpm.  - Personally Reviewed   Physical Exam   VS:  BP (!) 121/49   Pulse 68   Temp 97.6 F (36.4 C) (Oral)   Resp 18   Ht 5\' 1"  (1.549 m)   Wt 58 kg   SpO2 100%   BMI 24.16 kg/m  , BMI Body mass index is 24.16 kg/m. GENERAL:  Ill-appearing.  No acute distress HEENT: Pupils equal round and reactive, fundi not visualized, oral mucosa unremarkable NECK:  No jugular venous distention, waveform within normal limits, carotid upstroke brisk and symmetric, no bruits LUNGS:  Mild expiratory wheezes HEART:  RRR.  PMI not displaced or sustained,S1 and S2 within normal limits, no S3, no S4, no clicks, no rubs,  no murmurs CHEST: Midline incision C/D/I.  No erythema ABD:  Flat, positive bowel sounds normal in frequency in pitch, no bruits, no rebound, no guarding, no midline pulsatile mass, no hepatomegaly, no splenomegaly EXT:  2 plus pulses throughout, trace LE edema, no cyanosis no clubbing SKIN:  Large L arm ecchymosis NEURO:  Cranial nerves II through XII grossly intact, motor grossly intact throughout PSYCH:  Cognitively intact, oriented to person place and time   Labs    High Sensitivity Troponin:  Recent Labs  Lab 05/10/19 1303 05/10/19 1527 05/10/19 1906 05/12/19 0653 05/12/19 0908  TROPONINIHS 44* 499* 975* 1,563* 1,288*      Chemistry Recent Labs  Lab 05/21/19 0441 05/22/19 0346 05/23/19 0318  NA 137 136 139  K 3.8 3.9 3.6  CL 101 101 98  CO2 26 24 30   GLUCOSE 88 101* 84  BUN 15 13 13   CREATININE 0.84 0.71 0.76  CALCIUM 7.8* 8.0* 8.1*  GFRNONAA >60 >60 >60  GFRAA >60 >60 >60  ANIONGAP 10 11 11      Hematology Recent Labs  Lab 05/21/19 0441 05/22/19 0346 05/23/19 0318  WBC 10.8* 14.7* 11.1*  RBC 3.13* 3.11* 3.27*  HGB 9.1* 9.2* 9.6*  HCT 29.8* 28.9* 31.3*  MCV 95.2 92.9 95.7  MCH 29.1 29.6 29.4  MCHC 30.5 31.8 30.7  RDW 17.1* 17.2* 17.6*  PLT 171 165 204    BNP No results for input(s): BNP, PROBNP in the last 168 hours.   DDimer No results for input(s): DDIMER in the  last 168 hours.   Radiology    No results found.  Cardiac Studies   Echo 05/16/19: 1. Left ventricular ejection fraction, by visual estimation, is 55 to 60%. The left ventricle has normal function. Left ventricular septal wall thickness was mildly increased. There is mildly increased left ventricular hypertrophy. 2. Inferior basal hypokinesis but overal EF preserved. 3. Global right ventricle has normal systolic function.The right ventricular size is normal. No increase in right ventricular wall thickness. 4. Left atrial size was normal. 5. Right atrial size was mildly dilated. 6. Moderate calcification of the mitral valve leaflet(s). 7. Moderate mitral annular calcification. 8. Moderate thickening of the mitral valve leaflet(s). 9. The mitral valve is normal in structure. No evidence of mitral valve regurgitation. No evidence of mitral stenosis. 10. The tricuspid valve is normal in structure. Tricuspid valve regurgitation is mild. 11. The aortic valve is tricuspid Aortic valve regurgitation was not visualized by color flow Doppler. Mild to moderate aortic valve sclerosis/calcification without any evidence of aortic stenosis. 12. The pulmonic valve was not well visualized. Pulmonic valve regurgitation was not assessed by color flow Doppler. 13. Post pericardial window with no tamponade or residual effuson. 14. The interatrial septum was not well visualized.  Patient Profile     85 y.o. female with COPD, hypertension, and GERD admitted with COPD exacerbation with hypoxic respiratory failure and pneumonia.  She was then found to have NSTEMI and a left heart cath had a proximal RCA stenosis.  The intervention was complicated by perforation with tamponade and hemopericardium.  She was taken to the OR 9/29 for pericardial washout.  Course has been complicated by postoperative atrial fibrillation.  Assessment & Plan    # NSTEMI: High-sensitivity troponin was 1500.  She underwent PCI  of the proximal RCA.  However this was complicated by perforation as above.  Continue aspirin, ticagrelor, atorvastatin, and metoprolol.   # Acute systolic and diastolic heart failure: Initial echo revealed LVEF 45 to 50% with RCA region wall motion abnormalities.  This improved post PCI. Gentle diuresis with IV lasix.  Volume status improving.  Will switch to oral lasix tomorrow.    # Atrial fibrillation with RVR: # SSS: She had atrial fibrillation post operatively and off and on since then.  She also had postconversion pauses up to 3.7 seconds.  No afib in the last 24 hours.  She has been somewhat bradycardic in sinus rhythm.  We will reduce metoprolol to 12.5 mg  daily.  No pacemaker for now.  # COPD/Hypoxic respiratory failure: She is now off supplemental oxygen. Completed 5 days of steroids/antibiotics.  Dulera with prn albuterol as recommended by critical care  # Acute blood loss anemia: Hemopericardium 2/2 coronary perforation.  She received 3u pRBB on 9/29.  H/h remains stable.   # Thrombocytopenia: Likely related to surgery and critical illness.  # Essential hypertension: Home benazepril is on hold.   # Deconditioning: She is very weak and tired.  Planning for, and inpatient rehab.  She will likely be ready for discharge tomorrow.       For questions or updates, please contact [facility] Please consult [email or web address] for contact info under        Signed, Chilton Si, MD  05/23/2019, 11:06 AM

## 2019-05-23 NOTE — Progress Notes (Signed)
Inpatient Rehabilitation-Admissions Coordinator   Pt and family have decided on CIR. I will begin insurance authorization process for possible admit.  Will follow up once there is a determination.   Jhonnie Garner, OTR/L  Rehab Admissions Coordinator  [phone] 05/23/2019 2:09 PM

## 2019-05-24 ENCOUNTER — Inpatient Hospital Stay (HOSPITAL_COMMUNITY): Payer: Medicare Other

## 2019-05-24 DIAGNOSIS — K219 Gastro-esophageal reflux disease without esophagitis: Secondary | ICD-10-CM

## 2019-05-24 DIAGNOSIS — I4819 Other persistent atrial fibrillation: Secondary | ICD-10-CM

## 2019-05-24 DIAGNOSIS — R778 Other specified abnormalities of plasma proteins: Secondary | ICD-10-CM

## 2019-05-24 LAB — CBC
HCT: 34 % — ABNORMAL LOW (ref 36.0–46.0)
Hemoglobin: 10.9 g/dL — ABNORMAL LOW (ref 12.0–15.0)
MCH: 30.6 pg (ref 26.0–34.0)
MCHC: 32.1 g/dL (ref 30.0–36.0)
MCV: 95.5 fL (ref 80.0–100.0)
Platelets: 268 10*3/uL (ref 150–400)
RBC: 3.56 MIL/uL — ABNORMAL LOW (ref 3.87–5.11)
RDW: 17.5 % — ABNORMAL HIGH (ref 11.5–15.5)
WBC: 12.1 10*3/uL — ABNORMAL HIGH (ref 4.0–10.5)
nRBC: 0 % (ref 0.0–0.2)

## 2019-05-24 LAB — BASIC METABOLIC PANEL
Anion gap: 8 (ref 5–15)
BUN: 19 mg/dL (ref 8–23)
CO2: 31 mmol/L (ref 22–32)
Calcium: 8.4 mg/dL — ABNORMAL LOW (ref 8.9–10.3)
Chloride: 99 mmol/L (ref 98–111)
Creatinine, Ser: 0.84 mg/dL (ref 0.44–1.00)
GFR calc Af Amer: >60 mL/min (ref >60)
GFR calc non Af Amer: >60 mL/min (ref >60)
Glucose, Bld: 106 mg/dL — ABNORMAL HIGH (ref 70–99)
Potassium: 3.6 mmol/L (ref 3.5–5.1)
Sodium: 138 mmol/L (ref 135–145)

## 2019-05-24 LAB — URINALYSIS, ROUTINE W REFLEX MICROSCOPIC
Bilirubin Urine: NEGATIVE
Glucose, UA: NEGATIVE mg/dL
Hgb urine dipstick: NEGATIVE
Ketones, ur: NEGATIVE mg/dL
Nitrite: NEGATIVE
Protein, ur: NEGATIVE mg/dL
Specific Gravity, Urine: 1.017 (ref 1.005–1.030)
pH: 5 (ref 5.0–8.0)

## 2019-05-24 LAB — GLUCOSE, CAPILLARY: Glucose-Capillary: 110 mg/dL — ABNORMAL HIGH (ref 70–99)

## 2019-05-24 LAB — HEPATIC FUNCTION PANEL
ALT: 155 U/L — ABNORMAL HIGH (ref 0–44)
AST: 52 U/L — ABNORMAL HIGH (ref 15–41)
Albumin: 2.5 g/dL — ABNORMAL LOW (ref 3.5–5.0)
Alkaline Phosphatase: 64 U/L (ref 38–126)
Bilirubin, Direct: 0.3 mg/dL — ABNORMAL HIGH (ref 0.0–0.2)
Indirect Bilirubin: 0.9 mg/dL (ref 0.3–0.9)
Total Bilirubin: 1.2 mg/dL (ref 0.3–1.2)
Total Protein: 5 g/dL — ABNORMAL LOW (ref 6.5–8.1)

## 2019-05-24 LAB — TSH: TSH: 4.306 u[IU]/mL (ref 0.350–4.500)

## 2019-05-24 MED ORDER — AMIODARONE HCL 200 MG PO TABS: 200.0000 mg | ORAL_TABLET | Freq: Every day | ORAL | Status: DC

## 2019-05-24 MED ORDER — AMIODARONE HCL 200 MG PO TABS
200.0000 mg | ORAL_TABLET | Freq: Every day | ORAL | Status: DC
  Administered 2019-05-24 – 2019-05-28 (×5): 200 mg via ORAL
  Filled 2019-05-24 (×5): qty 1

## 2019-05-24 MED ORDER — METOPROLOL TARTRATE 12.5 MG HALF TABLET: 12.5000 mg | ORAL_TABLET | Freq: Every day | ORAL | Status: DC | PRN

## 2019-05-24 NOTE — Progress Notes (Signed)
Inpatient Rehabilitation-Admissions Coordinator   Received call from pt's insurance company stating pt's case has been withdrawn and not denied. Case was withdrawn as pt was not medically stable at time of peer to peer. Per representative at her insurance company, this means the case can be resubmitted for consideration for CIR. I discussed this with the patient and her daughter bedside that while the case was actually withdrawn, if re-submitted for CIR, she may likely be denied. Pt and her daughter still want to pursue CIR despite risk of denial and are asking for resubmission. I will reassess pt on Monday for candidacy and resubmit for CIR approval per family request, if appropriate.   Jhonnie Garner, OTR/L  Rehab Admissions Coordinator  [phone] 05/24/2019 5:31 PM

## 2019-05-24 NOTE — Progress Notes (Signed)
Patient oxygen weaned to 1 liter, oxygen saturation at 100% on 1 liter.

## 2019-05-24 NOTE — Progress Notes (Addendum)
Physical Therapy Treatment Patient Details Name: Amy Chambers MRN: [id number] DOB: 05/26/1928 Today's Date: 05/24/2019    History of Present Illness 85 yo female with onset of hypoxia, hypercapnia and acute respiratory failure was admitted, may have been a medication reaction.  Cleared for PE, Covid (-).  PMHx:  thyroid disease, COPD, HTN, atherosclerosis carotids, bronchitis, underwent cath on 9/29 and developed post procedure hypotension found to have pericardial effusion with concern for tampanade due to coronary bleed.  Underwent emergent sternotomy, and evacuation of pericardial effusion.  Extubated 10/1.    PT Comments    Pt progressing well towards physical therapy goals, with mildly improved activity tolerance. Ambulating 50 feet with walker at a min guard assist level, requiring min assist for transfers. SpO2 99% on 1L O2, BP 139/60. Continues with decreased endurance, weakness, deconditioning. Needs reinforcement for sternal precautions. Continue to recommend comprehensive inpatient rehab (CIR) for post-acute therapy needs.    Follow Up Recommendations  CIR     Equipment Recommendations  Other (comment)(Rollator)    Recommendations for Other Services       Precautions / Restrictions Precautions Precautions: Sternal;Fall Restrictions Weight Bearing Restrictions: No    Mobility  Bed Mobility Overal bed mobility: Needs Assistance Bed Mobility: Rolling;Sidelying to Sit Rolling: Min guard Sidelying to sit: Min guard       General bed mobility comments: no physical assist required, increased time to rise  Transfers Overall transfer level: Needs assistance Equipment used: Rolling walker (2 wheeled) Transfers: Sit to/from Stand Sit to Stand: Min assist;+2 safety/equipment         General transfer comment: max cues for hand placement, minA to boost up from edge of bed  Ambulation/Gait Ambulation/Gait assistance: Min guard;+2 safety/equipment Gait Distance (Feet):  50 Feet Assistive device: Rolling walker (2 wheeled) Gait Pattern/deviations: Step-through pattern;Decreased stride length Gait velocity: decreased   General Gait Details: cues for activity pacing, pursed lip breathing   Stairs             Wheelchair Mobility    Modified Rankin (Stroke Patients Only)       Balance Overall balance assessment: Needs assistance Sitting-balance support: No upper extremity supported Sitting balance-Leahy Scale: Fair     Standing balance support: Bilateral upper extremity supported Standing balance-Leahy Scale: Poor                              Cognition Arousal/Alertness: Awake/alert Behavior During Therapy: WFL for tasks assessed/performed Overall Cognitive Status: Impaired/Different from baseline Area of Impairment: Memory                     Memory: Decreased recall of precautions         General Comments: pt needs max cues to maintain sternal precautions during functional mobility      Exercises General Exercises - Lower Extremity Long Arc Quad: AROM;Both;10 reps;Seated Hip Flexion/Marching: Both;10 reps;Seated    General Comments        Pertinent Vitals/Pain Pain Assessment: Faces Faces Pain Scale: Hurts a little bit Pain Location: chest Pain Descriptors / Indicators: Grimacing;Aching;Guarding Pain Intervention(s): Monitored during session    Home Living                      Prior Function            PT Goals (current goals can now be found in the care plan section) Acute Rehab PT Goals Patient  Stated Goal: to get home and feel stronger Potential to Achieve Goals: Good Progress towards PT goals: Progressing toward goals    Frequency    Min 3X/week      PT Plan Current plan remains appropriate    Co-evaluation              AM-PAC PT "6 Clicks" Mobility   Outcome Measure  Help needed turning from your back to your side while in a flat bed without using  bedrails?: A Little Help needed moving from lying on your back to sitting on the side of a flat bed without using bedrails?: A Little Help needed moving to and from a bed to a chair (including a wheelchair)?: A Little Help needed standing up from a chair using your arms (e.g., wheelchair or bedside chair)?: A Little Help needed to walk in hospital room?: A Little Help needed climbing 3-5 steps with a railing? : A Lot 6 Click Score: 17    End of Session Equipment Utilized During Treatment: Oxygen;Gait belt Activity Tolerance: Patient tolerated treatment well Patient left: with call bell/phone within reach;with family/visitor present;in bed Nurse Communication: Mobility status;Precautions PT Visit Diagnosis: Other abnormalities of gait and mobility (R26.89);Muscle weakness (generalized) (M62.81)     Time: 1039-1100 PT Time Calculation (min) (ACUTE ONLY): 21 min  Charges:  $Therapeutic Activity: 8-22 mins                     Laurina Bustle, PT, DPT Acute Rehabilitation Services Pager [phone] Office [phone]    Vanetta Mulders 05/24/2019, 12:36 PM

## 2019-05-24 NOTE — Care Management (Signed)
Per Leonette Monarch. W/ [employer] Co-pay for Eliquis 2.5 mg and 5mg  bid $3.90 for 30 day supply brand name for ( generic) $1.30 for a 30 day supply. Mail order  90 day supply Eliquis $3.90.  No PA required Pt, may use any pharmacy Tier 3 medication   Ref# [id number]

## 2019-05-24 NOTE — Progress Notes (Signed)
Progress Note  Patient Name: Amy Chambers Date of Encounter: 05/24/2019  Primary Cardiologist: Evalina Field, MD  Subjective   She doesn't feel well today - was nauseated overnight, required Zofran. Generally weak. She has some expected incisional pain but no anginal type pain or dyspnea. BP on the softer side this AM, 105/88 -> 105/42 -> 104/38.  Inpatient Medications    Scheduled Meds:  [START ON 05/25/2019] amiodarone  400 mg Oral Daily   apixaban  2.5 mg Oral BID   atorvastatin  40 mg Oral q1800   clopidogrel  75 mg Oral Daily   feeding supplement (ENSURE ENLIVE)  237 mL Oral BID BM   furosemide  20 mg Oral Daily   guaiFENesin  600 mg Oral BID   levothyroxine  75 mcg Oral Daily   loratadine  10 mg Oral Daily   mouth rinse  15 mL Mouth Rinse BID   metoprolol succinate  12.5 mg Oral Daily   mometasone-formoterol  2 puff Inhalation BID   pantoprazole  40 mg Oral Q0600   polyethylene glycol  17 g Oral Daily   sodium chloride flush  3 mL Intravenous Q12H   Continuous Infusions:  sodium chloride Stopped (05/17/19 2000)   PRN Meds: sodium chloride, acetaminophen, albuterol, diphenhydrAMINE-zinc acetate, morphine injection, ondansetron **OR** ondansetron (ZOFRAN) IV, oxyCODONE, sodium chloride flush, traMADol, traZODone   Vital Signs    Vitals:   05/24/19 0501 05/24/19 0510 05/24/19 0816 05/24/19 0857  BP:  (!) 159/65 105/88   Pulse:  62 (!) 57   Resp:  20 18   Temp:  97.9 F (36.6 C) 97.7 F (36.5 C)   TempSrc:  Oral Oral   SpO2:  100% 100% 100%  Weight: 59.1 kg     Height:        Intake/Output Summary (Last 24 hours) at 05/24/2019 1010 Last data filed at 05/24/2019 0958 Gross per 24 hour  Intake 1200 ml  Output 1050 ml  Net 150 ml   Last 3 Weights 05/24/2019 05/23/2019 05/22/2019  Weight (lbs) 130 lb 4.8 oz 127 lb 13.9 oz 129 lb 6.6 oz  Weight (kg) 59.104 kg 58 kg 58.7 kg     Telemetry    SB/NSR HR 50s-60s - Personally  Reviewed  Physical Exam   GEN: No acute distress, frail appearing elderly WF HEENT: Normocephalic, atraumatic, sclera non-icteric. Neck: No JVD or bruits. Cardiac: RRR, no murmurs, rubs, or gallops. Radials/DP/PT 1+ and equal bilaterally.  Respiratory: Clear to auscultation bilaterally. Breathing is unlabored. GI: Soft, nontender, non-distended, BS +x 4. MS: no deformity. Ecchymosis on R arm. Sternotomy wound c/d/i Extremities: No clubbing or cyanosis. No edema. Distal pedal pulses are 2+ and equal bilaterally. Neuro:  AAOx3. Follows commands. Psych:  Responds to questions appropriately with a normal affect.  Labs    High Sensitivity Troponin:   Recent Labs  Lab 05/10/19 1303 05/10/19 1527 05/10/19 1906 05/12/19 0653 05/12/19 0908  TROPONINIHS 44* 499* 975* 1,563* 1,288*      Cardiac EnzymesNo results for input(s): TROPONINI in the last 168 hours. No results for input(s): TROPIPOC in the last 168 hours.   Chemistry Recent Labs  Lab 05/21/19 0441 05/22/19 0346 05/23/19 0318  NA 137 136 139  K 3.8 3.9 3.6  CL 101 101 98  CO2 26 24 30   GLUCOSE 88 101* 84  BUN 15 13 13   CREATININE 0.84 0.71 0.76  CALCIUM 7.8* 8.0* 8.1*  GFRNONAA >60 >60 >60  GFRAA >60 >60 >  60  ANIONGAP 10 11 11      Hematology Recent Labs  Lab 05/21/19 0441 05/22/19 0346 05/23/19 0318  WBC 10.8* 14.7* 11.1*  RBC 3.13* 3.11* 3.27*  HGB 9.1* 9.2* 9.6*  HCT 29.8* 28.9* 31.3*  MCV 95.2 92.9 95.7  MCH 29.1 29.6 29.4  MCHC 30.5 31.8 30.7  RDW 17.1* 17.2* 17.6*  PLT 171 165 204  . Radiology    No results found.  Cardiac Studies   Echo 05/16/19  1. Left ventricular ejection fraction, by visual estimation, is 55 to 60%. The left ventricle has normal function. Left ventricular septal wall thickness was mildly increased. There is mildly increased left ventricular hypertrophy. 2. Inferior basal hypokinesis but overal EF preserved. 3. Global right ventricle has normal systolic function.The  right ventricular size is normal. No increase in right ventricular wall thickness. 4. Left atrial size was normal. 5. Right atrial size was mildly dilated. 6. Moderate calcification of the mitral valve leaflet(s). 7. Moderate mitral annular calcification. 8. Moderate thickening of the mitral valve leaflet(s). 9. The mitral valve is normal in structure. No evidence of mitral valve regurgitation. No evidence of mitral stenosis. 10. The tricuspid valve is normal in structure. Tricuspid valve regurgitation is mild. 11. The aortic valve is tricuspid Aortic valve regurgitation was not visualized by color flow Doppler. Mild to moderate aortic valve sclerosis/calcification without any evidence of aortic stenosis. 12. The pulmonic valve was not well visualized. Pulmonic valve regurgitation was not assessed by color flow Doppler. 13. Post pericardial window with no tamponade or residual effuson. 14. The interatrial septum was not well visualized.  Patient Profile     85 y.o. female from [place] with history of COPD, HTN, and GERD who was initially admitted with COPD exacerbation with hypoxic respiratory failure. It was unclear if there was perhaps a component of respiratory failure from allergic reaction to Augmentin. She was treated with steroids, antibiotics, and supplemental O2. As part of her workup she was also found to have NSTEMI with hsTroponin peak of 1563 and 2D echo with suspected ischemic cardiomyopathy with EF 45-50% and mild hypokinesis of the basal to mid inferoseptum/inferior wall concerning for RCA ischemia/infarction. She underwent cath 05/14/19 with PTCA/DES to proximal RCA stenosis. The intervention was complicated by perforation with tamponade and hemopericardium. She was taken to the OR 9/29 for mediastinal pericardial washout. Post-op course was complicated by postoperative paroxysmal atrial fibrillation with intermittent post-conversion pauses up to 3.7 seconds. She was seen by EP,  loaded with amiodarone, and metoprolol was reduced. She was not felt to require PPM.  Assessment & Plan    1. NSTEMI/CAD s/p PCI of the proximal RCA - Aspirin was discontinued given hemopericardium. Ticagrelor was switched to Plavix given need for concomitant Eliquis. Given soft BP, will stop metoprolol today per review with Dr. 10/29. Will f/u CBC/BMET today to trend.  2. Acute combined CHF - LVEF normalized by repeat echo 05/16/19. BP has been labile but trending low this AM. Will stop scheduled Lasix and metoprolol and follow.  3. AECOPD with acute hypoxic respiratory failure - completed 5 days of steroids/abx. She will continue Dulera with PRN albuterol as recommended by critical care.  4. Paroxymal atrial fibrillation with SSS - She had atrial fibrillation post operatively on and off. She also had postconversion pauses up to 3.7 seconds. She was loaded with amiodarone. She has been maintaining NSR/SB the last 2 days. She is having some nausea which may be related to the amiodarone load. Per review with  MD, decrease amiodarone to 200mg  daily. Will add f/u LFTs/TSH to labs.  5. Acute blood loss anemia/hemopericardium 2/2 coronary perforation - Hgb appears stable the last several days, but will get f/u Hgb this AM given downtrending BP. TCTS last saw pt 10/5 and felt stable. Incision c/d/i.  6. Thrombocytopenia - likely related to surgery and critical illness, resolved.  7. Deconditioning - next up will be CIR for patient. May need to observe through today given BP and nausea.   For questions or updates, please contact [facility] Please consult [email or web address] for contact info under Cardiology/STEMI.  Signed, Laurann Montanaayna N Jenniferann Stuckert, PA-C 05/24/2019, 10:10 AM

## 2019-05-24 NOTE — Progress Notes (Addendum)
Discussed f/u labs with Dr. Oval Linsey - WBC with mild increase and LFTs also slightly abnormal. Pt was c/o nausea this morning, prompting change in amio dosing. We dc'd metoprolol and Lasix due to softer BPs. Per d/w Dr. Oval Linsey, would hold off transfer to CIR today and get 2V CXR, UA and RUQ ultrasound to ensure no changes. If no acute abnormalities and labs are stable/improving tomorrow, we anticipated discharge tomorrow to CIR.  However, Raechel Ache with OT reached out to relay that her insurance would not outright approve CIR as they felt subacute SNF was more appropriate. This would require a peer to peer review. I discussed the recommendation with Dr. Oval Linsey. I made the peer to peer call as requested and relayed all recommendations from OT, PT, and Dr. Blenda Mounts input. However, CIR was not approved as her insurance company Dr. Luana Shu feels subacute SNF is more appropriate and CIR will not be covered. Per our discussion, Claiborne Billings will notify the CIR team, CM, and SW about transition to SNF pursuit. She will also relay this to nurse as well. Discharge date will depend on bed availability.  I have preemptively made an appt for APP in [place] 06/10/19 and put appt info on AVS. May need to change appt location if she goes to a SNF closer to [place]. I also spoke with TCTS APP who will be arranging a f/u appt with their office and including sternotomy instructions on her AVS.  Melina Copa PA-C

## 2019-05-24 NOTE — Progress Notes (Signed)
Discussed ed with pt and daughter, very receptive. Discussed sternal precautions, MI, stent, Plavix/eliquis, IS, mobility/exercise, low sodium diet, and CRPII. Will refer to [place] for "down the road".  [place] CES, ACSM 3:05 PM 05/24/2019

## 2019-05-25 LAB — BASIC METABOLIC PANEL
Anion gap: 11 (ref 5–15)
BUN: 17 mg/dL (ref 8–23)
CO2: 31 mmol/L (ref 22–32)
Calcium: 8.1 mg/dL — ABNORMAL LOW (ref 8.9–10.3)
Chloride: 96 mmol/L — ABNORMAL LOW (ref 98–111)
Creatinine, Ser: 0.87 mg/dL (ref 0.44–1.00)
GFR calc Af Amer: >60 mL/min (ref >60)
GFR calc non Af Amer: 59 mL/min — ABNORMAL LOW (ref >60)
Glucose, Bld: 86 mg/dL (ref 70–99)
Potassium: 3.4 mmol/L — ABNORMAL LOW (ref 3.5–5.1)
Sodium: 138 mmol/L (ref 135–145)

## 2019-05-25 LAB — CBC
HCT: 31.3 % — ABNORMAL LOW (ref 36.0–46.0)
Hemoglobin: 9.6 g/dL — ABNORMAL LOW (ref 12.0–15.0)
MCH: 29.3 pg (ref 26.0–34.0)
MCHC: 30.7 g/dL (ref 30.0–36.0)
MCV: 95.4 fL (ref 80.0–100.0)
Platelets: 230 10*3/uL (ref 150–400)
RBC: 3.28 MIL/uL — ABNORMAL LOW (ref 3.87–5.11)
RDW: 17.6 % — ABNORMAL HIGH (ref 11.5–15.5)
WBC: 11.6 10*3/uL — ABNORMAL HIGH (ref 4.0–10.5)
nRBC: 0 % (ref 0.0–0.2)

## 2019-05-25 LAB — HEPATIC FUNCTION PANEL
ALT: 122 U/L — ABNORMAL HIGH (ref 0–44)
AST: 46 U/L — ABNORMAL HIGH (ref 15–41)
Albumin: 2.4 g/dL — ABNORMAL LOW (ref 3.5–5.0)
Alkaline Phosphatase: 55 U/L (ref 38–126)
Bilirubin, Direct: 0.2 mg/dL (ref 0.0–0.2)
Indirect Bilirubin: 0.7 mg/dL (ref 0.3–0.9)
Total Bilirubin: 0.9 mg/dL (ref 0.3–1.2)
Total Protein: 4.7 g/dL — ABNORMAL LOW (ref 6.5–8.1)

## 2019-05-25 MED ORDER — POTASSIUM CHLORIDE CRYS ER 20 MEQ PO TBCR
40.0000 meq | EXTENDED_RELEASE_TABLET | Freq: Once | ORAL | Status: AC
  Administered 2019-05-25: 40 meq via ORAL
  Filled 2019-05-25: qty 2

## 2019-05-25 MED ORDER — FUROSEMIDE 20 MG PO TABS
20.0000 mg | ORAL_TABLET | Freq: Every day | ORAL | Status: DC
  Administered 2019-05-25 – 2019-05-31 (×7): 20 mg via ORAL
  Filled 2019-05-25 (×7): qty 1

## 2019-05-25 NOTE — Progress Notes (Signed)
CSW acknowledges consult for SNF. OT/PT is recommending CIR at this time. Please re-consult if disposition plan changes. Consult is being closed out. 

## 2019-05-25 NOTE — Progress Notes (Signed)
   05/25/19 0800  Mobility  Activity Turned to left side    Pt assisted back to bed, from side-of-bed to eat.

## 2019-05-25 NOTE — Progress Notes (Signed)
   05/25/19 1656  Vitals  Temp 98.5 F (36.9 C)  Temp Source Oral  BP (!) 145/52  MAP (mmHg) 79  BP Location Right Arm  BP Method Automatic  Patient Position (if appropriate) Lying  Pulse Rate 62  Pulse Rate Source Dinamap  Oxygen Therapy  SpO2 100 %  O2 Device Nasal Cannula  O2 Flow Rate (L/min) 1 L/min (weaned to room air after this reading)  MEWS Score  MEWS RR 0  MEWS Pulse 0  MEWS Systolic 0  MEWS LOC 0  MEWS Temp 0  MEWS Score 0  MEWS Score Color Green     Patient weaned to room air. 1L/min via nasal canula is available for patient to put in place should she feel short of breath.

## 2019-05-25 NOTE — Progress Notes (Signed)
   05/25/19 1212  Oxygen Therapy  SpO2 98 %  O2 Device Nasal Cannula  O2 Flow Rate (L/min) 2 L/min    Patient titrated to 1L/min. Will re-assess O2 saturation after lunch.

## 2019-05-25 NOTE — Progress Notes (Signed)
MD at bedside to answer some of daughter's questions directly.  Daughter/patient express concern about continued spread of itchy erythematous rash on back, beginning to wrap torso. pt to be changed into cotton gown to eliminate antimicrobial gown as a factor.   Pt and daughter educated on PO intake for strict I&O monitoring.

## 2019-05-25 NOTE — Progress Notes (Signed)
Patient resting comfortably during shift report. Denies complaints. Pt assisted to side of bed, set up for breakfast.

## 2019-05-25 NOTE — Progress Notes (Signed)
Page to cardiology to confirm whether or not they would like pt to receive statin this evening, as her LFTs were elevated and dose was held yesterday.   Awaiting callback.

## 2019-05-25 NOTE — Progress Notes (Signed)
Progress Note  Patient Name: Amy Chambers Date of Encounter: 05/25/2019  Primary Cardiologist: Reatha HarpsWesley T O'Neal, MD  Subjective   Reports dyspnea improved.  Continues to have some nausea, but it is intermittent and currently feels well.  Soft BPs yesterday, improved today up to 140s this morning  Inpatient Medications    Scheduled Meds:  amiodarone  200 mg Oral Daily   apixaban  2.5 mg Oral BID   atorvastatin  40 mg Oral q1800   clopidogrel  75 mg Oral Daily   feeding supplement (ENSURE ENLIVE)  237 mL Oral BID BM   guaiFENesin  600 mg Oral BID   levothyroxine  75 mcg Oral Daily   loratadine  10 mg Oral Daily   mouth rinse  15 mL Mouth Rinse BID   mometasone-formoterol  2 puff Inhalation BID   pantoprazole  40 mg Oral Q0600   polyethylene glycol  17 g Oral Daily   sodium chloride flush  3 mL Intravenous Q12H   Continuous Infusions:  sodium chloride Stopped (05/17/19 2000)   PRN Meds: sodium chloride, acetaminophen, albuterol, diphenhydrAMINE-zinc acetate, metoprolol tartrate, morphine injection, ondansetron **OR** ondansetron (ZOFRAN) IV, oxyCODONE, sodium chloride flush, traMADol, traZODone   Vital Signs    Vitals:   05/25/19 0105 05/25/19 0510 05/25/19 0752 05/25/19 0830  BP: (!) 111/42 (!) 127/52 (!) 146/63   Pulse: 63 63 66   Resp: 16 16 18    Temp: (!) 97.4 F (36.3 C) (!) 97.3 F (36.3 C) (!) 97.3 F (36.3 C)   TempSrc: Oral Oral Oral   SpO2: 100% 100% 100% 99%  Weight:  59 kg    Height:        Intake/Output Summary (Last 24 hours) at 05/25/2019 1028 Last data filed at 05/25/2019 0846 Gross per 24 hour  Intake 483 ml  Output 700 ml  Net -217 ml   Last 3 Weights 05/25/2019 05/24/2019 05/23/2019  Weight (lbs) 130 lb 1.1 oz 130 lb 4.8 oz 127 lb 13.9 oz  Weight (kg) 59 kg 59.104 kg 58 kg     Telemetry    Atrial fibrillation x2 hours overnight, rates 100s.  Currently in sinus rhythm with rate in 60s- Personally Reviewed  Physical Exam     GEN: No acute distress, frail appearing elderly WF HEENT: Normocephalic, atraumatic, sclera non-icteric. Neck: No JVD or bruits. Cardiac: RRR, no murmurs, rubs, or gallops Respiratory: Clear to auscultation bilaterally. Breathing is unlabored. GI: Soft, nontender, non-distended, BS +x 4. MS: no deformity. Ecchymosis on R arm. Sternotomy wound c/d/i Extremities: No clubbing or cyanosis. 2+ LE edema.  Neuro:  AAOx3. Follows commands. Psych:  Responds to questions appropriately with a normal affect.  Labs    High Sensitivity Troponin:   Recent Labs  Lab 05/10/19 1303 05/10/19 1527 05/10/19 1906 05/12/19 0653 05/12/19 0908  TROPONINIHS 44* 499* 975* 1,563* 1,288*      Cardiac EnzymesNo results for input(s): TROPONINI in the last 168 hours. No results for input(s): TROPIPOC in the last 168 hours.   Chemistry Recent Labs  Lab 05/23/19 0318 05/24/19 1117 05/25/19 0439  NA 139 138 138  K 3.6 3.6 3.4*  CL 98 99 96*  CO2 30 31 31   GLUCOSE 84 106* 86  BUN 13 19 17   CREATININE 0.76 0.84 0.87  CALCIUM 8.1* 8.4* 8.1*  PROT  --  5.0* 4.7*  ALBUMIN  --  2.5* 2.4*  AST  --  52* 46*  ALT  --  155* 122*  ALKPHOS  --  64 55  BILITOT  --  1.2 0.9  GFRNONAA >60 >60 59*  GFRAA >60 >60 >60  ANIONGAP 11 8 11      Hematology Recent Labs  Lab 05/23/19 0318 05/24/19 1117 05/25/19 0439  WBC 11.1* 12.1* 11.6*  RBC 3.27* 3.56* 3.28*  HGB 9.6* 10.9* 9.6*  HCT 31.3* 34.0* 31.3*  MCV 95.7 95.5 95.4  MCH 29.4 30.6 29.3  MCHC 30.7 32.1 30.7  RDW 17.6* 17.5* 17.6*  PLT 204 268 230  . Radiology    Dg Chest 2 View  Result Date: 05/24/2019 CLINICAL DATA:  Acute respiratory failure with hypoxia hypercapnia. History of COPD. EXAM: CHEST - 2 VIEW COMPARISON:  CT chest, abdomen and pelvis 05/14/2019. Single-view of the chest 05/15/2019 and 05/16/2019. FINDINGS: Small layering pleural effusions are present bilaterally. The lungs are emphysematous but clear. Cardiopericardial silhouette  is enlarged compatible pericardial effusion seen on the prior CT. IMPRESSION: Small layering pleural effusions. Enlarged cardiopericardial silhouette compatible with pericardial effusion as seen on prior CT. Emphysema. Electronically Signed   By: Inge Rise M.D.   On: 05/24/2019 17:12   US Abdomen Limited Ruq  Result Date: 05/24/2019 CLINICAL DATA:  Leukocytosis, elevated LFTs. EXAM: ULTRASOUND ABDOMEN LIMITED RIGHT UPPER QUADRANT COMPARISON:  CT abdomen pelvis 05/14/2019 FINDINGS: Gallbladder: Small amount of biliary sludge. No gallstones or wall thickening visualized. No sonographic Murphy sign noted by sonographer. Common bile duct: Diameter: 0.4 cm Liver: There are two hepatic cysts as seen on prior CT, one in the posterior right hepatic lobe measuring 1.6 x 1.2 x 1.6 cm and another in the right hepatic lobe measuring 4.8 x 2.7 x 4.1 cm. Within normal limits in parenchymal echogenicity. Portal vein is patent on color Doppler imaging with normal direction of blood flow towards the liver. Other: None. IMPRESSION: 1. No sonographic finding to explain the patient's leukocytosis and elevated LFTs. 2. Small amount of biliary sludge. No evidence of acute gallbladder inflammation. 3.  Hepatic cysts. Electronically Signed   By: Audie Pinto M.D.   On: 05/24/2019 21:14    Cardiac Studies   Echo 05/16/19  1. Left ventricular ejection fraction, by visual estimation, is 55 to 60%. The left ventricle has normal function. Left ventricular septal wall thickness was mildly increased. There is mildly increased left ventricular hypertrophy. 2. Inferior basal hypokinesis but overal EF preserved. 3. Global right ventricle has normal systolic function.The right ventricular size is normal. No increase in right ventricular wall thickness. 4. Left atrial size was normal. 5. Right atrial size was mildly dilated. 6. Moderate calcification of the mitral valve leaflet(s). 7. Moderate mitral annular  calcification. 8. Moderate thickening of the mitral valve leaflet(s). 9. The mitral valve is normal in structure. No evidence of mitral valve regurgitation. No evidence of mitral stenosis. 10. The tricuspid valve is normal in structure. Tricuspid valve regurgitation is mild. 11. The aortic valve is tricuspid Aortic valve regurgitation was not visualized by color flow Doppler. Mild to moderate aortic valve sclerosis/calcification without any evidence of aortic stenosis. 12. The pulmonic valve was not well visualized. Pulmonic valve regurgitation was not assessed by color flow Doppler. 13. Post pericardial window with no tamponade or residual effuson. 14. The interatrial septum was not well visualized.  Patient Profile     85 y.o. female from [place] with history of COPD, HTN, and GERD who was initially admitted with COPD exacerbation with hypoxic respiratory failure. It was unclear if there was perhaps a component of respiratory failure from  allergic reaction to Augmentin. She was treated with steroids, antibiotics, and supplemental O2. As part of her workup she was also found to have NSTEMI with hsTroponin peak of 1563 and 2D echo with suspected ischemic cardiomyopathy with EF 45-50% and mild hypokinesis of the basal to mid inferoseptum/inferior wall concerning for RCA ischemia/infarction. She underwent cath 05/14/19 with PTCA/DES to proximal RCA stenosis. The intervention was complicated by perforation with tamponade and hemopericardium. She was taken to the OR 9/29 for mediastinal pericardial washout. Post-op course was complicated by postoperative paroxysmal atrial fibrillation with intermittent post-conversion pauses up to 3.7 seconds. She was seen by EP, loaded with amiodarone, and metoprolol was reduced. She was not felt to require PPM.  Assessment & Plan    1. NSTEMI/CAD s/p PCI of the proximal RCA - Aspirin was discontinued given hemopericardium. Ticagrelor was switched to Plavix given need  for concomitant Eliquis.  Metoprolol was stopped due to soft BP.   2. Acute combined CHF - LVEF normalized by repeat echo 05/16/19. BP has been labile. Metoprolol were held due to soft blood pressures on 10/9.  Appears edematous, will restart p.o. Lasix 20 mg daily  3. AECOPD with acute hypoxic respiratory failure - completed 5 days of steroids/abx. She will continue Dulera with PRN albuterol as recommended by critical care.  4. Paroxymal atrial fibrillation with SSS - She had atrial fibrillation post operatively on and off. She also had postconversion pauses up to 3.7 seconds. She was loaded with amiodarone. She has been maintaining NSR/SB. She is having some nausea which may be related to the amiodarone load, amiodarone decrease to 200mg  daily. On Eliquis 2.5 mg BID.  5. Acute blood loss anemia/hemopericardium 2/2 coronary perforation - Hgb appears stable the last several days. TCTS last saw pt 10/5 and felt stable. Incision c/d/i.  6. Thrombocytopenia - likely related to surgery and critical illness, resolved.  7. Deconditioning - next up will be CIR for patient. May need to observe through today given BP and nausea.   8. Transaminitis: mild elevation, AST 46, ALT 122. RUQ unremarkable.  Might have been ischemic injury from when she was hypotensive on pressors as appears to be improving.  Could be related to amiodarone use, but is improving.  Will monitor for resolution, if not improving will need to discontinue amiodarone  For questions or updates, please contact [facility] Please consult [email or web address] for contact info under Cardiology/STEMI.  Signed, [place], MD 05/25/2019, 10:28 AM

## 2019-05-25 NOTE — Progress Notes (Signed)
   Vital Signs MEWS/VS Documentation      05/25/2019 0105 05/25/2019 0510 05/25/2019 0744 05/25/2019 0752   MEWS Score:  0  0  0  0   MEWS Score Color:  Green  Green  Green  Green   Resp:  16  16  -  18   Pulse:  63  63  -  66   BP:  (!) 111/42  (!) 127/52  -  (!) 146/63   Temp:  (!) 97.4 F (36.3 C)  (!) 97.3 F (36.3 C)  -  (!) 97.3 F (36.3 C)   O2 Device:  Room Air  Nasal Cannula  -  Nasal Cannula   O2 Flow Rate (L/min):  -  -  -  2 L/min   Level of Consciousness:  -  -  Alert  -           Kristeena Meineke N Tobias-Diakun 05/25/2019,7:54 AM

## 2019-05-26 DIAGNOSIS — R7401 Elevation of levels of liver transaminase levels: Secondary | ICD-10-CM

## 2019-05-26 DIAGNOSIS — I480 Paroxysmal atrial fibrillation: Secondary | ICD-10-CM

## 2019-05-26 LAB — HEPATIC FUNCTION PANEL
ALT: 115 U/L — ABNORMAL HIGH (ref 0–44)
AST: 38 U/L (ref 15–41)
Albumin: 2.9 g/dL — ABNORMAL LOW (ref 3.5–5.0)
Alkaline Phosphatase: 63 U/L (ref 38–126)
Bilirubin, Direct: 0.3 mg/dL — ABNORMAL HIGH (ref 0.0–0.2)
Indirect Bilirubin: 0.8 mg/dL (ref 0.3–0.9)
Total Bilirubin: 1.1 mg/dL (ref 0.3–1.2)
Total Protein: 5.7 g/dL — ABNORMAL LOW (ref 6.5–8.1)

## 2019-05-26 LAB — BASIC METABOLIC PANEL
Anion gap: 10 (ref 5–15)
BUN: 15 mg/dL (ref 8–23)
CO2: 30 mmol/L (ref 22–32)
Calcium: 8.8 mg/dL — ABNORMAL LOW (ref 8.9–10.3)
Chloride: 97 mmol/L — ABNORMAL LOW (ref 98–111)
Creatinine, Ser: 0.8 mg/dL (ref 0.44–1.00)
GFR calc Af Amer: >60 mL/min (ref >60)
GFR calc non Af Amer: >60 mL/min (ref >60)
Glucose, Bld: 113 mg/dL — ABNORMAL HIGH (ref 70–99)
Potassium: 4.3 mmol/L (ref 3.5–5.1)
Sodium: 137 mmol/L (ref 135–145)

## 2019-05-26 LAB — CBC
HCT: 34.2 % — ABNORMAL LOW (ref 36.0–46.0)
Hemoglobin: 10.4 g/dL — ABNORMAL LOW (ref 12.0–15.0)
MCH: 29.4 pg (ref 26.0–34.0)
MCHC: 30.4 g/dL (ref 30.0–36.0)
MCV: 96.6 fL (ref 80.0–100.0)
Platelets: 268 10*3/uL (ref 150–400)
RBC: 3.54 MIL/uL — ABNORMAL LOW (ref 3.87–5.11)
RDW: 17.8 % — ABNORMAL HIGH (ref 11.5–15.5)
WBC: 9.7 10*3/uL (ref 4.0–10.5)
nRBC: 0 % (ref 0.0–0.2)

## 2019-05-26 LAB — URINE CULTURE

## 2019-05-26 MED ORDER — METOPROLOL SUCCINATE ER 25 MG PO TB24
12.5000 mg | ORAL_TABLET | Freq: Every day | ORAL | Status: DC
  Administered 2019-05-26 – 2019-05-30 (×5): 12.5 mg via ORAL
  Filled 2019-05-26 (×5): qty 1

## 2019-05-26 NOTE — Progress Notes (Signed)
Progress Note  Patient Name: Amy Chambers Date of Encounter: 05/26/2019  Primary Cardiologist: Evalina Field, MD  Subjective   Reports dyspnea improved.  PO lasix restarted yesterday, net negative 200cc.  BP stable (140s this AM).  Brief (~5 minute) run of AF yesterday evening, rates up to 120s.  Reports pruritic rash on lower back   Inpatient Medications    Scheduled Meds:  amiodarone  200 mg Oral Daily   apixaban  2.5 mg Oral BID   atorvastatin  40 mg Oral q1800   clopidogrel  75 mg Oral Daily   feeding supplement (ENSURE ENLIVE)  237 mL Oral BID BM   furosemide  20 mg Oral Daily   guaiFENesin  600 mg Oral BID   levothyroxine  75 mcg Oral Daily   loratadine  10 mg Oral Daily   mouth rinse  15 mL Mouth Rinse BID   mometasone-formoterol  2 puff Inhalation BID   pantoprazole  40 mg Oral Q0600   polyethylene glycol  17 g Oral Daily   sodium chloride flush  3 mL Intravenous Q12H   Continuous Infusions:  sodium chloride Stopped (05/17/19 2000)   PRN Meds: sodium chloride, acetaminophen, albuterol, diphenhydrAMINE-zinc acetate, metoprolol tartrate, morphine injection, ondansetron **OR** ondansetron (ZOFRAN) IV, oxyCODONE, sodium chloride flush, traMADol, traZODone   Vital Signs    Vitals:   05/26/19 0047 05/26/19 0603 05/26/19 0605 05/26/19 0817  BP: (!) 132/50  (!) 145/60   Pulse: 66  64   Resp: 16  14   Temp: 98.2 F (36.8 C)  98.2 F (36.8 C)   TempSrc: Oral  Oral   SpO2: 99%  98% 98%  Weight:  57.5 kg    Height:        Intake/Output Summary (Last 24 hours) at 05/26/2019 0843 Last data filed at 05/26/2019 0557 Gross per 24 hour  Intake 1033.83 ml  Output 1200 ml  Net -166.17 ml   Last 3 Weights 05/26/2019 05/25/2019 05/24/2019  Weight (lbs) 126 lb 12.2 oz 130 lb 1.1 oz 130 lb 4.8 oz  Weight (kg) 57.5 kg 59 kg 59.104 kg     Telemetry    Atrial fibrillation x5 minutes overnight, rates 100-120s.  Currently in sinus rhythm with rate in  60s- Personally Reviewed  Physical Exam   GEN: No acute distress, frail appearing elderly WF HEENT: Normocephalic, atraumatic, sclera non-icteric. Neck: No JVD or bruits. Cardiac: RRR, no murmurs, rubs, or gallops Respiratory: Clear to auscultation bilaterally. Breathing is unlabored. GI: Soft, nontender, non-distended, BS +x 4. MS: no deformity. Ecchymosis on R arm. Sternotomy wound c/d/i Extremities: No clubbing or cyanosis. 2+ LE edema.  Neuro:  AAOx3. Follows commands. Psych:  Responds to questions appropriately with a normal affect. Skin: erythema over lower back  Labs    High Sensitivity Troponin:   Recent Labs  Lab 05/10/19 1303 05/10/19 1527 05/10/19 1906 05/12/19 0653 05/12/19 0908  TROPONINIHS 44* 499* 975* 1,563* 1,288*      Cardiac EnzymesNo results for input(s): TROPONINI in the last 168 hours. No results for input(s): TROPIPOC in the last 168 hours.   Chemistry Recent Labs  Lab 05/23/19 0318 05/24/19 1117 05/25/19 0439  NA 139 138 138  K 3.6 3.6 3.4*  CL 98 99 96*  CO2 30 31 31   GLUCOSE 84 106* 86  BUN 13 19 17   CREATININE 0.76 0.84 0.87  CALCIUM 8.1* 8.4* 8.1*  PROT  --  5.0* 4.7*  ALBUMIN  --  2.5* 2.4*  AST  --  52* 46*  ALT  --  155* 122*  ALKPHOS  --  64 55  BILITOT  --  1.2 0.9  GFRNONAA >60 >60 59*  GFRAA >60 >60 >60  ANIONGAP 11 8 11      Hematology Recent Labs  Lab 05/23/19 0318 05/24/19 1117 05/25/19 0439  WBC 11.1* 12.1* 11.6*  RBC 3.27* 3.56* 3.28*  HGB 9.6* 10.9* 9.6*  HCT 31.3* 34.0* 31.3*  MCV 95.7 95.5 95.4  MCH 29.4 30.6 29.3  MCHC 30.7 32.1 30.7  RDW 17.6* 17.5* 17.6*  PLT 204 268 230  . Radiology    Dg Chest 2 View  Result Date: 05/24/2019 CLINICAL DATA:  Acute respiratory failure with hypoxia hypercapnia. History of COPD. EXAM: CHEST - 2 VIEW COMPARISON:  CT chest, abdomen and pelvis 05/14/2019. Single-view of the chest 05/15/2019 and 05/16/2019. FINDINGS: Small layering pleural effusions are present  bilaterally. The lungs are emphysematous but clear. Cardiopericardial silhouette is enlarged compatible pericardial effusion seen on the prior CT. IMPRESSION: Small layering pleural effusions. Enlarged cardiopericardial silhouette compatible with pericardial effusion as seen on prior CT. Emphysema. Electronically Signed   By: 07/16/2019 M.D.   On: 05/24/2019 17:12   07/24/2019 Abdomen Limited Ruq  Result Date: 05/24/2019 CLINICAL DATA:  Leukocytosis, elevated LFTs. EXAM: ULTRASOUND ABDOMEN LIMITED RIGHT UPPER QUADRANT COMPARISON:  CT abdomen pelvis 05/14/2019 FINDINGS: Gallbladder: Small amount of biliary sludge. No gallstones or wall thickening visualized. No sonographic Murphy sign noted by sonographer. Common bile duct: Diameter: 0.4 cm Liver: There are two hepatic cysts as seen on prior CT, one in the posterior right hepatic lobe measuring 1.6 x 1.2 x 1.6 cm and another in the right hepatic lobe measuring 4.8 x 2.7 x 4.1 cm. Within normal limits in parenchymal echogenicity. Portal vein is patent on color Doppler imaging with normal direction of blood flow towards the liver. Other: None. IMPRESSION: 1. No sonographic finding to explain the patient's leukocytosis and elevated LFTs. 2. Small amount of biliary sludge. No evidence of acute gallbladder inflammation. 3.  Hepatic cysts. Electronically Signed   By: 05/16/2019 M.D.   On: 05/24/2019 21:14    Cardiac Studies   Echo 05/16/19  1. Left ventricular ejection fraction, by visual estimation, is 55 to 60%. The left ventricle has normal function. Left ventricular septal wall thickness was mildly increased. There is mildly increased left ventricular hypertrophy. 2. Inferior basal hypokinesis but overal EF preserved. 3. Global right ventricle has normal systolic function.The right ventricular size is normal. No increase in right ventricular wall thickness. 4. Left atrial size was normal. 5. Right atrial size was mildly dilated. 6. Moderate  calcification of the mitral valve leaflet(s). 7. Moderate mitral annular calcification. 8. Moderate thickening of the mitral valve leaflet(s). 9. The mitral valve is normal in structure. No evidence of mitral valve regurgitation. No evidence of mitral stenosis. 10. The tricuspid valve is normal in structure. Tricuspid valve regurgitation is mild. 11. The aortic valve is tricuspid Aortic valve regurgitation was not visualized by color flow Doppler. Mild to moderate aortic valve sclerosis/calcification without any evidence of aortic stenosis. 12. The pulmonic valve was not well visualized. Pulmonic valve regurgitation was not assessed by color flow Doppler. 13. Post pericardial window with no tamponade or residual effuson. 14. The interatrial septum was not well visualized.  Patient Profile     85 y.o. female from 89 with history of COPD, HTN, and GERD who was initially admitted with COPD exacerbation with hypoxic respiratory  failure. It was unclear if there was perhaps a component of respiratory failure from allergic reaction to Augmentin. She was treated with steroids, antibiotics, and supplemental O2. As part of her workup she was also found to have NSTEMI with hsTroponin peak of 1563 and 2D echo with suspected ischemic cardiomyopathy with EF 45-50% and mild hypokinesis of the basal to mid inferoseptum/inferior wall concerning for RCA ischemia/infarction. She underwent cath 05/14/19 with PTCA/DES to proximal RCA stenosis. The intervention was complicated by perforation with tamponade and hemopericardium. She was taken to the OR 9/29 for mediastinal pericardial washout. Post-op course was complicated by postoperative paroxysmal atrial fibrillation with intermittent post-conversion pauses up to 3.7 seconds. She was seen by EP, loaded with amiodarone, and metoprolol was reduced. She was not felt to require PPM.  Assessment & Plan    1. NSTEMI/CAD s/p PCI of the proximal RCA - on plavix and  Eliquis.  Metoprolol was stopped due to soft BP, which has improved, will add back low dose metoprolol  2. Acute combined CHF - LVEF normalized by repeat echo 05/16/19. BP has been labile. Metoprolol were held due to soft blood pressures on 10/9, has improved, will restart.  Continue PO lasix 20 mg daily.    3. AECOPD with acute hypoxic respiratory failure - completed 5 days of steroids/abx. She will continue Dulera with PRN albuterol as recommended by critical care.  4. Paroxymal atrial fibrillation with SSS - She had atrial fibrillation post operatively on and off. She also had postconversion pauses up to 3.7 seconds. She was loaded with amiodarone. She has been maintaining NSR/SB with occasional short runs of AF with RVR. She is having some nausea which may be related to the amiodarone load, amiodarone decrease to 200mg  daily. Add back toprol XL given rates when in AF up to 120s.  On Eliquis 2.5 mg BID.  5. Acute blood loss anemia/hemopericardium 2/2 coronary perforation - Hgb appears stable the last several days. TCTS last saw pt 10/5 and felt stable. Incision c/d/i.  6. Thrombocytopenia - likely related to surgery and critical illness, resolved.  7. Deconditioning - planning for CIR  8. Transaminitis: mild elevation, improving, AST has normalized. RUQ US unremarkable.  Suspect ischemic injury from when she was hypotensive on pressors as appears to be improving.  Could be related to amiodarone use, but less likely as continues to improve  9. Rash: erythema over lower back, will continue to monitor  For questions or updates, please contact [facility] Please consult [email or web address] for contact info under Cardiology/STEMI.  Signed, Little Ishikawahristopher L Maize Brittingham, MD 05/26/2019, 8:43 AM

## 2019-05-27 DIAGNOSIS — R627 Adult failure to thrive: Secondary | ICD-10-CM

## 2019-05-27 LAB — CBC
HCT: 33.7 % — ABNORMAL LOW (ref 36.0–46.0)
Hemoglobin: 10.8 g/dL — ABNORMAL LOW (ref 12.0–15.0)
MCH: 30.3 pg (ref 26.0–34.0)
MCHC: 32 g/dL (ref 30.0–36.0)
MCV: 94.4 fL (ref 80.0–100.0)
Platelets: 249 10*3/uL (ref 150–400)
RBC: 3.57 MIL/uL — ABNORMAL LOW (ref 3.87–5.11)
RDW: 18 % — ABNORMAL HIGH (ref 11.5–15.5)
WBC: 13.1 10*3/uL — ABNORMAL HIGH (ref 4.0–10.5)
nRBC: 0 % (ref 0.0–0.2)

## 2019-05-27 LAB — HEPATIC FUNCTION PANEL
ALT: 85 U/L — ABNORMAL HIGH (ref 0–44)
AST: 29 U/L (ref 15–41)
Albumin: 2.6 g/dL — ABNORMAL LOW (ref 3.5–5.0)
Alkaline Phosphatase: 63 U/L (ref 38–126)
Bilirubin, Direct: 0.2 mg/dL (ref 0.0–0.2)
Indirect Bilirubin: 0.8 mg/dL (ref 0.3–0.9)
Total Bilirubin: 1 mg/dL (ref 0.3–1.2)
Total Protein: 5.1 g/dL — ABNORMAL LOW (ref 6.5–8.1)

## 2019-05-27 LAB — BASIC METABOLIC PANEL
Anion gap: 9 (ref 5–15)
BUN: 18 mg/dL (ref 8–23)
CO2: 30 mmol/L (ref 22–32)
Calcium: 8.4 mg/dL — ABNORMAL LOW (ref 8.9–10.3)
Chloride: 97 mmol/L — ABNORMAL LOW (ref 98–111)
Creatinine, Ser: 0.8 mg/dL (ref 0.44–1.00)
GFR calc Af Amer: >60 mL/min (ref >60)
GFR calc non Af Amer: >60 mL/min (ref >60)
Glucose, Bld: 106 mg/dL — ABNORMAL HIGH (ref 70–99)
Potassium: 4.2 mmol/L (ref 3.5–5.1)
Sodium: 136 mmol/L (ref 135–145)

## 2019-05-27 LAB — PREALBUMIN: Prealbumin: 17.7 mg/dL — ABNORMAL LOW (ref 18–38)

## 2019-05-27 MED ORDER — SENNOSIDES-DOCUSATE SODIUM 8.6-50 MG PO TABS
1.0000 | ORAL_TABLET | Freq: Once | ORAL | Status: AC
  Administered 2019-05-27: 1 via ORAL
  Filled 2019-05-27: qty 1

## 2019-05-27 NOTE — Progress Notes (Signed)
Inpatient Rehabilitation-Admissions Coordinator   Met with pt and her daughter bedside after PT and OT session this morning. Pt expressed being very tired and does not feel that she would be able to participate in more therapy as is expected in IP Rehab. After extensive discussion regarding her ultimate goals for rehab, it has been decided that the patient would prefer SNF over CIR at this time.   I will sign off and I have contacted SW regarding need for new dispo plans.   Jhonnie Garner, OTR/L  Rehab Admissions Coordinator  [phone] 05/27/2019 12:33 PM

## 2019-05-27 NOTE — Progress Notes (Signed)
Occupational Therapy Treatment Patient Details Name: Amy Chambers MRN: [id number] DOB: [birth year] Today's Date: 05/27/2019    History of present illness 85 yo female with onset of hypoxia, hypercapnia and acute respiratory failure was admitted, may have been a medication reaction.  Cleared for PE, Covid (-).  PMHx:  thyroid disease, COPD, HTN, atherosclerosis carotids, bronchitis, underwent cath on 9/29 and developed post procedure hypotension found to have pericardial effusion with concern for tampanade due to coronary bleed.  Underwent emergent sternotomy, and evacuation of pericardial effusion.  Extubated 10/1.   OT comments  Patient seated in recliner, having just finished with PT and agreeable for OT session.  Patient fatigued, but motivated to engage.  Completed transfer at sink with min assist, grooming with min assist (initated standing but progressing to sitting due to fatigue).  Reviewed sternal precautions, but requires min cueing to adhere to functionally. Continue to recommend CIR to maximize independence and decrease burden of care upon dc home.  Will follow acutely.    Follow Up Recommendations  CIR    Equipment Recommendations  Other (comment)(TBd at next venue of care)    Recommendations for Other Services      Precautions / Restrictions Precautions Precautions: Fall;Sternal Precaution Booklet Issued: No Restrictions Weight Bearing Restrictions: No Other Position/Activity Restrictions: reviewed sternotomy precautions with pt and daughter       Mobility Bed Mobility Overal bed mobility: Needs Assistance Bed Mobility: Rolling Rolling: Min guard Sidelying to sit: Min assist       General bed mobility comments: OOB in recliner upon entry  Transfers Overall transfer level: Needs assistance Equipment used: Rolling walker (2 wheeled) Transfers: Sit to/from Stand Sit to Stand: Min assist         General transfer comment: min assist to power up and stedy,  cueing for hand placement and precaution adherance    Balance Overall balance assessment: Needs assistance Sitting-balance support: No upper extremity supported;Feet supported Sitting balance-Leahy Scale: Good Sitting balance - Comments: pt sat EOB and I checked her BP sitting -she was dizzy. she can keep herself erect   Standing balance support: No upper extremity supported;During functional activity;Bilateral upper extremity supported Standing balance-Leahy Scale: Poor Standing balance comment: able to stand statically without UE support                           ADL either performed or assessed with clinical judgement   ADL Overall ADL's : Needs assistance/impaired     Grooming: Minimal assistance;Sitting;Standing;Wash/dry hands;Wash/dry face;Brushing hair Grooming Details (indicate cue type and reason): initated standing, but fatigues easily and completed tasks in sitting                  Toilet Transfer: Minimal assistance Toilet Transfer Details (indicate cue type and reason): simulated in room         Functional mobility during ADLs: Minimal assistance General ADL Comments: pt limited by decreased activity tolerance and generalized weakness     Vision       Perception     Praxis      Cognition Arousal/Alertness: Awake/alert Behavior During Therapy: WFL for tasks assessed/performed Overall Cognitive Status: Impaired/Different from baseline Area of Impairment: Memory                     Memory: Decreased recall of precautions         General Comments: pt able to verbalize precautions, but min cueing to adhere  to functionally        Exercises     Shoulder Instructions       General Comments daughter present and safety    Pertinent Vitals/ Pain       Pain Assessment: Faces Faces Pain Scale: Hurts a little bit Pain Location: generalized Pain Descriptors / Indicators: Aching Pain Intervention(s): Monitored during  session;Repositioned  Home Living                                          Prior Functioning/Environment              Frequency  Min 2X/week        Progress Toward Goals  OT Goals(current goals can now be found in the care plan section)  Progress towards OT goals: Progressing toward goals  Acute Rehab OT Goals Patient Stated Goal: to get home and feel stronger OT Goal Formulation: With patient  Plan Discharge plan remains appropriate;Frequency remains appropriate    Co-evaluation                 AM-PAC OT "6 Clicks" Daily Activity     Outcome Measure   Help from another person eating meals?: None Help from another person taking care of personal grooming?: A Little Help from another person toileting, which includes using toliet, bedpan, or urinal?: A Lot Help from another person bathing (including washing, rinsing, drying)?: A Little Help from another person to put on and taking off regular upper body clothing?: A Lot Help from another person to put on and taking off regular lower body clothing?: A Lot 6 Click Score: 16    End of Session    OT Visit Diagnosis: Unsteadiness on feet (R26.81);Other abnormalities of gait and mobility (R26.89);Muscle weakness (generalized) (M62.81);Pain Pain - part of body: (generalized)   Activity Tolerance Patient tolerated treatment well   Patient Left in chair;with call bell/phone within reach;with family/visitor present   Nurse Communication Mobility status        Time: 8756-4332 OT Time Calculation (min): 21 min  Charges: OT General Charges $OT Visit: 1 Visit OT Treatments $Self Care/Home Management : 8-22 mins   Chancy Milroy, OT Acute Rehabilitation Services Pager [phone] Office [phone]    Chancy Milroy 05/27/2019, 12:31 PM

## 2019-05-27 NOTE — Progress Notes (Signed)
Inpatient Rehabilitation-Admissions Coordinator   Awaiting updated PT and OT notes prior to re-opening case for CIR per pt/family request. I have contacted acute therapy office for request.   Please call if questions.   Jhonnie Garner, OTR/L  Rehab Admissions Coordinator  [phone] 05/27/2019 8:58 AM

## 2019-05-27 NOTE — NC FL2 (Signed)
[place] LEVEL OF CARE SCREENING TOOL     IDENTIFICATION  Patient Name: Amy Chambers Birthdate: 04/13/1928 Sex: female Admission Date (Current Location): 05/10/2019  [place] and [place] Number:  Kathleen Argue [id number] [place] and Address:  [facility] [place], [place] [address] [place], [place] [address]      Provider Number: [id number]  Attending Physician Name and Address:  Geralynn Rile, MD  Relative Name and Phone Number:  Tawni Levy, Daughter, [phone]    Current Level of Care: Hospital Recommended Level of Care: [place] Prior Approval Number:    Date Approved/Denied: 05/27/19 PASRR Number: [id number]  Discharge Plan: SNF    Current Diagnoses: Patient Active Problem List   Diagnosis Date Noted  . Hemopericardium   . Pericardial tamponade   . CAD S/P percutaneous coronary angioplasty   . Acute respiratory failure with hypoxia and hypercapnia ([place]) 05/10/2019  . NSTEMI (non-ST elevated myocardial infarction) ([place]) 05/10/2019  . Simple chronic bronchitis ([place]) 05/24/2018  . HTN (hypertension) 12/01/2010  . Hyperlipemia 12/01/2010  . Hypothyroidism 12/01/2010  . Osteopenia 12/01/2010  . GERD (gastroesophageal reflux disease) 12/01/2010    Orientation RESPIRATION BLADDER Height & Weight     Self, Time, Situation, Place  O2(98, [place], 1L) Incontinent, External catheter Weight: 131 lb 2.8 oz (59.5 kg) Height:  5\' 1"  (154.9 cm)  BEHAVIORAL SYMPTOMS/MOOD NEUROLOGICAL BOWEL NUTRITION STATUS      Continent Diet(2 gram sodium diet, thin liquids)  AMBULATORY STATUS COMMUNICATION OF NEEDS Skin   Limited Assist Verbally Bruising(bruising on arm/leg; closed incision on chest; generalized rash)                       Personal Care Assistance Level of Assistance  Bathing, Feeding, Dressing, Total care Bathing Assistance: Limited assistance Feeding assistance: Independent Dressing Assistance: Limited  assistance Total Care Assistance: Limited assistance   Functional Limitations Info  Sight, Hearing, Speech Sight Info: Adequate Hearing Info: Adequate Speech Info: Adequate    SPECIAL CARE FACTORS FREQUENCY  OT (By licensed OT), PT (By licensed PT)     PT Frequency: 5x/wk OT Frequency: 5x/wk            Contractures Contractures Info: Not present    Additional Factors Info  Code Status, Allergies Code Status Info: Partial Code Allergies Info: Crestor (Rosuvastatin Calcium), Niaspan (Niacin Er)           Current Medications (05/27/2019):  This is the current hospital active medication list Current Facility-Administered Medications  Medication Dose Route Frequency Provider Last Rate Last Dose  . 0.9 %  sodium chloride infusion  250 mL Intravenous PRN Leonie Man, MD   Stopped at 05/17/19 2000  . acetaminophen (TYLENOL) tablet 650 mg  650 mg Oral Q6H PRN Nani Skillern, PA-C   650 mg at 05/26/19 1221  . albuterol (PROVENTIL) (2.5 MG/3ML) 0.083% nebulizer solution 2.5 mg  2.5 mg Nebulization Q2H PRN Lars Pinks M, PA-C   2.5 mg at 05/11/19 2006  . amiodarone (PACERONE) tablet 200 mg  200 mg Oral Daily Meade Maw, MD   200 mg at 05/27/19 1021  . apixaban (ELIQUIS) tablet 2.5 mg  2.5 mg Oral BID Kris Mouton, RPH   2.5 mg at 05/27/19 1023  . atorvastatin (LIPITOR) tablet 40 mg  40 mg Oral q1800 Nani Skillern, PA-C   40 mg at 05/26/19 1755  . clopidogrel (PLAVIX) tablet 75 mg  75 mg Oral Daily Oval Linsey,  Tiffany, MD   75 mg at 05/27/19 1021  . diphenhydrAMINE-zinc acetate (BENADRYL) 2-0.1 % cream   Topical BID PRN Berton Bon, NP      . feeding supplement (ENSURE ENLIVE) (ENSURE ENLIVE) liquid 237 mL  237 mL Oral BID BM Corliss Skains, MD   Stopped at 05/25/19 1400  . furosemide (LASIX) tablet 20 mg  20 mg Oral Daily Little Ishikawa, MD   20 mg at 05/27/19 1022  . guaiFENesin (MUCINEX) 12 hr tablet 600 mg  600 mg Oral BID  Doree Fudge M, PA-C   600 mg at 05/27/19 1024  . levothyroxine (SYNTHROID) tablet 75 mcg  75 mcg Oral Daily Doree Fudge M, PA-C   75 mcg at 05/27/19 0526  . loratadine (CLARITIN) tablet 10 mg  10 mg Oral Daily Doree Fudge M, PA-C   10 mg at 05/27/19 1021  . MEDLINE mouth rinse  15 mL Mouth Rinse BID Sande Rives, MD   15 mL at 05/27/19 1031  . metoprolol succinate (TOPROL-XL) 24 hr tablet 12.5 mg  12.5 mg Oral Daily Little Ishikawa, MD   12.5 mg at 05/27/19 1020  . mometasone-formoterol (DULERA) 200-5 MCG/ACT inhaler 2 puff  2 puff Inhalation BID Luciano Cutter, MD   2 puff at 05/27/19 0912  . morphine 2 MG/ML injection 1 mg  1 mg Intravenous Q2H PRN Ardelle Balls, PA-C   1 mg at 05/24/19 0092  . ondansetron (ZOFRAN) tablet 4 mg  4 mg Oral Q6H PRN Ardelle Balls, PA-C   4 mg at 05/19/19 2143   Or  . ondansetron (ZOFRAN) injection 4 mg  4 mg Intravenous Q6H PRN Ardelle Balls, PA-C   4 mg at 05/24/19 3300  . oxyCODONE (Oxy IR/ROXICODONE) immediate release tablet 5 mg  5 mg Oral Q6H PRN Sande Rives, MD   5 mg at 05/27/19 0530  . pantoprazole (PROTONIX) EC tablet 40 mg  40 mg Oral Q0600 Ardelle Balls, PA-C   40 mg at 05/27/19 0526  . polyethylene glycol (MIRALAX / GLYCOLAX) packet 17 g  17 g Oral Daily Doree Fudge M, PA-C   17 g at 05/27/19 1031  . senna-docusate (Senokot-S) tablet 1 tablet  1 tablet Oral Once Nada Boozer R, NP      . sodium chloride flush (NS) 0.9 % injection 3 mL  3 mL Intravenous Q12H Doree Fudge M, PA-C   3 mL at 05/27/19 1037  . sodium chloride flush (NS) 0.9 % injection 3 mL  3 mL Intravenous PRN Marykay Lex, MD   3 mL at 05/26/19 0851  . traMADol (ULTRAM) tablet 50 mg  50 mg Oral Q6H PRN Ardelle Balls, PA-C   50 mg at 05/26/19 2001  . traZODone (DESYREL) tablet 50 mg  50 mg Oral QHS PRN Doree Fudge M, PA-C   50 mg at 05/20/19 0007     Discharge  Medications: Please see discharge summary for a list of discharge medications.  Relevant Imaging Results:  Relevant Lab Results:   Additional Information SSN: [id number]  Nada Boozer Jasean Ambrosia, LCSWA

## 2019-05-27 NOTE — Progress Notes (Signed)
Physical Therapy Treatment Patient Details Name: Amy Chambers MRN: [id number] DOB: 14-Apr-1928 Today's Date: 05/27/2019    History of Present Illness 85 yo female with onset of hypoxia, hypercapnia and acute respiratory failure was admitted, may have been a medication reaction.  Cleared for PE, Covid (-).  PMHx:  thyroid disease, COPD, HTN, atherosclerosis carotids, bronchitis, underwent cath on 9/29 and developed post procedure hypotension found to have pericardial effusion with concern for tampanade due to coronary bleed.  Underwent emergent sternotomy, and evacuation of pericardial effusion.  Extubated 10/1.    PT Comments    Pt refused therapy earlier today - didn't feel good.  She agreed now with encouragement.  Daughter present.  Pts urine in purwick very dark - I questioned her drinking and eating.  Pt reports on Friday she started having trouble drinking - it comes back up and she hasnt had much/wanted much to eat and drink this weekend due to this.  She also reports she hasnt had BM in 3 days. She overall doesn't feel good today - weak and wobbly and a little nauseous. She walked 60 feet with RW - 3/4 dyspnea and wheezing with ending pulse ox 91% and pulse 78bpm.  Pt tires easily.  Inspirometer to 600 ml.  At this point 3 hours of therapy would be difficult but if she was feeling better and eating and drinking it would be a possiblity.  Daughter present and informed of this.  I have encouraged pt to use BSC during the day (and not purwick) and increase her time OOB.  PT will continue to work with pt while in the hospital.   Follow Up Recommendations  CIR;Supervision for mobility/OOB     Equipment Recommendations  (pt needs youth RW)    Recommendations for Other Services       Precautions / Restrictions Precautions Precautions: Fall;Sternal Restrictions Weight Bearing Restrictions: No Other Position/Activity Restrictions: reviewed sternotomy precautions with pt and daughter     Mobility  Bed Mobility Overal bed mobility: Needs Assistance Bed Mobility: Rolling Rolling: Min guard Sidelying to sit: Min assist          Transfers Overall transfer level: Needs assistance Equipment used: Rolling walker (2 wheeled) Transfers: Sit to/from Stand Sit to Stand: Min assist         General transfer comment: pt given max cues for hand placement and she rocked forward to get more weight on legs - less on UEs  Ambulation/Gait Ambulation/Gait assistance: Min assist Gait Distance (Feet): 60 Feet Assistive device: Rolling walker (2 wheeled) Gait Pattern/deviations: Step-through pattern;Decreased stride length     General Gait Details: pt given cues for standing taller and for pursed lip breathing.   Stairs             Wheelchair Mobility    Modified Rankin (Stroke Patients Only)       Balance     Sitting balance-Leahy Scale: Good Sitting balance - Comments: pt sat EOB and I checked her BP sitting -she was dizzy. she can keep herself erect       Standing balance comment: pt stood with RW -s he feels weak in her legs.  she knows to put minimal weight through RW                            Cognition Arousal/Alertness: Awake/alert Behavior During Therapy: [facility] for tasks assessed/performed Overall Cognitive Status: Within Functional Limits for tasks assessed  General Comments: pt able to verbalize her sternotomy precautions      Exercises      General Comments General comments (skin integrity, edema, etc.): pt walked 60 feet wtih RW and cues for breathing.  pts 3/4 dyspnea and wheezing heard - pts ending pulse ox 91% and pulse 78bpm.  Pt did inspirometer - able to get to 600 ml.  Pt dizzy upon initial sitting BP 108/90 (66).  pt stood and BP 123/62 (72)      Pertinent Vitals/Pain Pain Assessment: Faces Faces Pain Scale: Hurts little more Pain Location: in her hips from laying in  bed Pain Descriptors / Indicators: Aching Pain Intervention(s): Monitored during session;Repositioned    Home Living                      Prior Function            PT Goals (current goals can now be found in the care plan section) Progress towards PT goals: Progressing toward goals    Frequency    Min 3X/week      PT Plan Current plan remains appropriate    Co-evaluation              AM-PAC PT "6 Clicks" Mobility   Outcome Measure  Help needed turning from your back to your side while in a flat bed without using bedrails?: A Little Help needed moving from lying on your back to sitting on the side of a flat bed without using bedrails?: A Little   Help needed standing up from a chair using your arms (e.g., wheelchair or bedside chair)?: A Little Help needed to walk in hospital room?: A Little Help needed climbing 3-5 steps with a railing? : A Lot 6 Click Score: 14    End of Session Equipment Utilized During Treatment: Gait belt Activity Tolerance: Patient tolerated treatment well;Patient limited by fatigue Patient left: with call bell/phone within reach;with family/visitor present;in chair Nurse Communication: Mobility status;Precautions PT Visit Diagnosis: Other abnormalities of gait and mobility (R26.89);Muscle weakness (generalized) (M62.81)     Time: 3500-9381 PT Time Calculation (min) (ACUTE ONLY): 46 min  Charges:  $Gait Training: 23-37 mins $Therapeutic Activity: 8-22 mins                    05/27/2019   Rande Lawman, PT    Loyal Buba 05/27/2019, 11:46 AM

## 2019-05-27 NOTE — TOC Initial Note (Signed)
Transition of Care [facility]) - Initial/Assessment Note    Patient Details  Name: Amy Chambers MRN: [id number] Date of Birth: [birth year]  Transition of Care [facility]) CM/SW Contact:    Gelene Mink, [place] Phone Number: 05/27/2019, 3:33 PM  Clinical Narrative:                  CSW met with the patient at bedside along with her daughter. CSW introduced herself, explained her role, and shared therapy recommendation. Patient and daughter are both agreeable to rehab. They would prefer [facility] in [place]. CSW provided CMS SNF list and obtained permission to fax the patient out. CSW will follow up with bed offers and start insurance authorization.   CSW will continue to follow.   Expected Discharge Plan: Skilled Nursing Facility Barriers to Discharge: Insurance Authorization   Patient Goals and CMS Choice Patient states their goals for this hospitalization and ongoing recovery are:: Pt is agreeable to rehab CMS Medicare.gov Compare Post Acute Care list provided to:: Patient Choice offered to / list presented to : Patient  Expected Discharge Plan and Services Expected Discharge Plan: [place] In-house Referral: Clinical Social Work Discharge Planning Services: NA Post Acute Care Choice: [place] Living arrangements for the past 2 months: Single Family Home                 DME Arranged: N/A         HH Arranged: NA HH Agency: NA Date HH Agency Contacted: 05/14/19 Time HH Agency Contacted: 53 Representative spoke with at [place]: [place] Arrangements/Services Living arrangements for the past 2 months: [place] with:: Self Patient language and need for interpreter reviewed:: No Do you feel safe going back to the place where you live?: No   Pt stated that she needed rehab before returning home  Need for Family Participation in Patient Care: No (Comment) Care giver support system in  place?: Yes (comment) Current home services: DME Criminal Activity/Legal Involvement Pertinent to Current Situation/Hospitalization: No - Comment as needed  Activities of Daily Living Home Assistive Devices/Equipment: Environmental consultant (specify type), Cane (specify quad or straight) ADL Screening (condition at time of admission) Patient's cognitive ability adequate to safely complete daily activities?: Yes Is the patient deaf or have difficulty hearing?: No Does the patient have difficulty seeing, even when wearing glasses/contacts?: No Does the patient have difficulty concentrating, remembering, or making decisions?: No Patient able to express need for assistance with ADLs?: Yes Does the patient have difficulty dressing or bathing?: Yes(pt states she "sponges off" at home independently) Independently performs ADLs?: Yes (appropriate for developmental age) Does the patient have difficulty walking or climbing stairs?: Yes Weakness of Legs: Both Weakness of Arms/Hands: None  Permission Sought/Granted Permission sought to share information with : Case Manager Permission granted to share information with : Yes, Verbal Permission Granted  Share Information with NAME: [name]  Permission granted to share info w AGENCY: All SNF  Permission granted to share info w Relationship: Daughter     Emotional Assessment Appearance:: Appears stated age Attitude/Demeanor/Rapport: Engaged Affect (typically observed): Calm Orientation: : Oriented to Self, Oriented to Place, Oriented to  Time, Oriented to Situation Alcohol / Substance Use: Not Applicable Psych Involvement: No (comment)  Admission diagnosis:  SOB (shortness of breath) [R06.02] Acute respiratory failure with hypoxia (HCC) [J96.01] Troponin I above reference range [R77.8] Acute encephalopathy [G93.40] COPD with acute exacerbation (HCC) [J44.1] Leukocytosis, unspecified type [K09.381]  NSTEMI (non-ST elevated myocardial infarction) [facility])  [I21.4] Patient Active Problem List   Diagnosis Date Noted  . Hemopericardium   . Pericardial tamponade   . CAD S/P percutaneous coronary angioplasty   . Acute respiratory failure with hypoxia and hypercapnia ([place]) 05/10/2019  . NSTEMI (non-ST elevated myocardial infarction) ([place]) 05/10/2019  . Simple chronic bronchitis ([place]) 05/24/2018  . HTN (hypertension) 12/01/2010  . Hyperlipemia 12/01/2010  . Hypothyroidism 12/01/2010  . Osteopenia 12/01/2010  . GERD (gastroesophageal reflux disease) 12/01/2010   PCP:  Chevis Pretty, [name] Pharmacy:   [facility], [place] [address] Phone: [phone] Fax: [facility] [address] [place] [address] [place] [address] [place] [address] Phone: [phone] Fax: [phone]  [facility] [place], [place] - [address] [place] [address] Phone: [phone] Fax: [phone]     Social Determinants of Health (SDOH) Interventions    Readmission Risk Interventions Readmission Risk Prevention Plan 05/14/2019  Transportation Screening Complete  Home Care Screening Complete  Medication Review (RN CM) Complete  Some recent data might be hidden

## 2019-05-27 NOTE — Progress Notes (Signed)
1430-1500 Came and offered to walk but pt tired from working with therapy and sitting up. Reviewed ed with family member re staying in the tube, importance of plavix, low sodium foods, CRP 2 and wound care. Understanding voiced. Will continue to follow. Graylon Good RN BSN 05/27/2019 2:57 PM

## 2019-05-27 NOTE — Progress Notes (Addendum)
Progress Note  Patient Name: Amy Chambers Date of Encounter: 05/27/2019  Primary Cardiologist: Reatha Harps, MD   Subjective   No chest pain or SOB, just walked in hall and did fairly well per PT.  Has trouble swallowing with liquids more where it comes back.   No BM in 3 days, on miralax I added senokot   Inpatient Medications    Scheduled Meds: . amiodarone  200 mg Oral Daily  . apixaban  2.5 mg Oral BID  . atorvastatin  40 mg Oral q1800  . clopidogrel  75 mg Oral Daily  . feeding supplement (ENSURE ENLIVE)  237 mL Oral BID BM  . furosemide  20 mg Oral Daily  . guaiFENesin  600 mg Oral BID  . levothyroxine  75 mcg Oral Daily  . loratadine  10 mg Oral Daily  . mouth rinse  15 mL Mouth Rinse BID  . metoprolol succinate  12.5 mg Oral Daily  . mometasone-formoterol  2 puff Inhalation BID  . pantoprazole  40 mg Oral Q0600  . polyethylene glycol  17 g Oral Daily  . senna-docusate  1 tablet Oral Once  . sodium chloride flush  3 mL Intravenous Q12H   Continuous Infusions: . sodium chloride Stopped (05/17/19 2000)   PRN Meds: sodium chloride, acetaminophen, albuterol, diphenhydrAMINE-zinc acetate, morphine injection, ondansetron **OR** ondansetron (ZOFRAN) IV, oxyCODONE, sodium chloride flush, traMADol, traZODone   Vital Signs    Vitals:   05/27/19 0114 05/27/19 0412 05/27/19 0732 05/27/19 0913  BP:  122/61 (!) 117/53   Pulse:  64 62   Resp:  18 (!) 22   Temp:  98 F (36.7 C) 98.2 F (36.8 C)   TempSrc:  Oral Oral   SpO2:  98% 99% 98%  Weight: 59.5 kg     Height:        Intake/Output Summary (Last 24 hours) at 05/27/2019 1113 Last data filed at 05/27/2019 0929 Gross per 24 hour  Intake 600 ml  Output 1125 ml  Net -525 ml   Last 3 Weights 05/27/2019 05/26/2019 05/25/2019  Weight (lbs) 131 lb 2.8 oz 126 lb 12.2 oz 130 lb 1.1 oz  Weight (kg) 59.5 kg 57.5 kg 59 kg      Telemetry    SR to ST mild with occ PVCs  - Personally Reviewed  ECG    No new -  Personally Reviewed  Physical Exam   GEN: No acute distress.   Neck: No JVD Cardiac: RRR, no murmurs, rubs, or gallops.  Respiratory: Clear to auscultation bilaterally. Though diminished. GI: Soft, nontender, non-distended  MS: No edema; No deformity. Neuro:  Nonfocal  Psych: Normal affect   Labs    High Sensitivity Troponin:   Recent Labs  Lab 05/10/19 1303 05/10/19 1527 05/10/19 1906 05/12/19 0653 05/12/19 0908  TROPONINIHS 44* 499* 975* 1,563* 1,288*      Chemistry Recent Labs  Lab 05/25/19 0439 05/26/19 0803 05/27/19 0745  NA 138 137 136  K 3.4* 4.3 4.2  CL 96* 97* 97*  CO2 31 30 30   GLUCOSE 86 113* 106*  BUN 17 15 18   CREATININE 0.87 0.80 0.80  CALCIUM 8.1* 8.8* 8.4*  PROT 4.7* 5.7* 5.1*  ALBUMIN 2.4* 2.9* 2.6*  AST 46* 38 29  ALT 122* 115* 85*  ALKPHOS 55 63 63  BILITOT 0.9 1.1 1.0  GFRNONAA 59* >60 >60  GFRAA >60 >60 >60  ANIONGAP 11 10 9      Hematology Recent Labs  Lab 05/25/19 0439 05/26/19 0931 05/27/19 0745  WBC 11.6* 9.7 13.1*  RBC 3.28* 3.54* 3.57*  HGB 9.6* 10.4* 10.8*  HCT 31.3* 34.2* 33.7*  MCV 95.4 96.6 94.4  MCH 29.3 29.4 30.3  MCHC 30.7 30.4 32.0  RDW 17.6* 17.8* 18.0*  PLT 230 268 249    BNPNo results for input(s): BNP, PROBNP in the last 168 hours.   DDimer No results for input(s): DDIMER in the last 168 hours.   Radiology    No results found.  Cardiac Studies   Echo 05/16/19 IMPRESSIONS    1. Left ventricular ejection fraction, by visual estimation, is 55 to 60%. The left ventricle has normal function. Left ventricular septal wall thickness was mildly increased. There is mildly increased left ventricular hypertrophy.  2. Inferior basal hypokinesis but overal EF preserved.  3. Global right ventricle has normal systolic function.The right ventricular size is normal. No increase in right ventricular wall thickness.  4. Left atrial size was normal.  5. Right atrial size was mildly dilated.  6. Moderate  calcification of the mitral valve leaflet(s).  7. Moderate mitral annular calcification.  8. Moderate thickening of the mitral valve leaflet(s).  9. The mitral valve is normal in structure. No evidence of mitral valve regurgitation. No evidence of mitral stenosis. 10. The tricuspid valve is normal in structure. Tricuspid valve regurgitation is mild. 11. The aortic valve is tricuspid Aortic valve regurgitation was not visualized by color flow Doppler. Mild to moderate aortic valve sclerosis/calcification without any evidence of aortic stenosis. 12. The pulmonic valve was not well visualized. Pulmonic valve regurgitation was not assessed by color flow Doppler. 13. Post pericardial window with no tamponade or residual effuson. 14. The interatrial septum was not well visualized.  FINDINGS  Left Ventricle: Left ventricular ejection fraction, by visual estimation, is 55 to 60%. The left ventricle has normal function. Left ventricular septal wall thickness was mildly increased. There is mildly increased left ventricular hypertrophy. Inferior  basal hypokinesis but overal EF preserved.   Patient Profile     85 y.o. female from [place] with history of COPD, HTN, and GERD who was initially admitted with COPD exacerbation with hypoxic respiratory failure. It was unclear if there was perhaps a component of respiratory failure from allergic reaction to Augmentin. She was treated with steroids, antibiotics, and supplemental O2. As part of her workup she was also found to have NSTEMI with hsTroponin peak of 1563 and 2D echo with suspected ischemic cardiomyopathy with EF 45-50% and mild hypokinesis of the basal to mid inferoseptum/inferior wall concerning for RCA ischemia/infarction. She underwent cath 05/14/19 with PTCA/DES to proximal RCA stenosis. The intervention was complicated by perforation with tamponade and hemopericardium. She was taken to the OR 9/29 for mediastinal pericardial washout. Post-op course  was complicated by postoperative paroxysmal atrial fibrillation with intermittent post-conversion pauses up to 3.7 seconds. She was seen by EP, loaded with amiodarone, and metoprolol was reduced. She was not felt to require PPM.  Assessment & Plan    1. NSTEMI/CAD s/p PCI of the proximal RCA - on plavix and Eliquis.  Metoprolol was stopped due to soft BP, which has improved, added back low dose metoprolol.  BP today 117/53   2. Acute combined CHF - LVEF normalized by repeat echo 05/16/19. BP has been labile. Metoprolol were held due to soft blood pressures on 10/9, has improved, will restart.  Continue PO lasix 20 mg daily.  total neg 2776 since admit wt at 59.5 Kg down from  pk of 64.4 Kg  -CXR 05/24/19  IMPRESSION: Small layering pleural effusions. Enlarged cardiopericardial silhouette compatible with pericardial effusion as seen on prior CT.  Emphysema.  3. AECOPD with acute hypoxic respiratory failure - completed 5 days of steroids/abx. She will continue Dulera with PRN albuterol as recommended by critical care.  4. Paroxymal atrial fibrillation with SSS - She had atrial fibrillation post operatively on and off.She also had postconversion pauses up to 3.7 seconds. She was loaded with amiodarone. She has been maintaining NSR/SB with occasional short runs of AF with RVR. She is having some nausea which may be related to the amiodarone load, amiodarone decrease to 200mg  daily. On Eliquis 2.5 mg BID.  5. Acute blood loss anemia/hemopericardium 2/2 coronary perforation - Hgb appears stable the last several days. TCTS last saw pt 10/5 and felt stable. Incision c/d/i. 05/15/19 mediastinal washout and exploration  250 cc old blood evacuated from the pericardium.  Chest tube removed 05/17/19  Hgb 10.8 and WBC 13.1   6. Thrombocytopenia - likely related to surgery and critical illness, resolved. plts now 249  7. Deconditioning - planning for CIR/SNF  8. Transaminitis: mild elevation, improving,  AST has normalized. RUQ US unremarkable.  Suspect ischemic injury from when she was hypotensive on pressors as appears to be improving.  Could be related to amiodarone use, but less likely as continues to improve  9. Rash: erythema over lower back, will continue to monitor  10. Difficulty swallowing will have speech eval.        For questions or updates, please contact [facility] Please consult [email or web address] for contact info under     Signed, Nada BoozerLaura Ingold, NP  05/27/2019, 11:13 AM    Patient seen and examined. Agree with assessment and plan.No recurrent chest pain. Maintaining sinus rhythm. Rash on back, resolved. Mild LFT elevation improving with AST 29, ALT 85.  Pt is having difficulty with swakllowing liquids, not solid. Will get swallowing study for further evaluation. Long history of remote tobacco with emphysema noted on cxr. Resumed BB with CAD, BP stable today.    Lennette Biharihomas A. Amillion [place], MD, [facility] 05/27/2019 12:07 PM

## 2019-05-27 NOTE — Progress Notes (Addendum)
PROGRESS NOTE    Amy Chambers  ZOX:[id number] DOB: March 26, 1928 DOA: 05/10/2019 PCP: [name], [name], [name]    Brief Narrative: Ms. Amy Chambers is a 85 year old female with a past medical history significant for hypertension, hypothyroidism, COPD, and GERD; who initially presented for cute onset of shortness of breath with hypoxia.  Found to have NSTEMI for which left heart cath revealed proximal RCA stenosis.  Intervention was complicated by perforation with subsequent tamponade and hemopericardium.  Cardiothoracic surgery was consulted and patient underwent pericardial washout on 9/29.  Postop noted to have atrial fibrillation with pauses and electrophysiology be consulted.  Currently patient noted for p.o. intake and failure to thrive.  In the process of being evaluated for inpatient rehab.   Assessment & Plan:   Principal Problem:   Acute respiratory failure with hypoxia and hypercapnia (HCC) Active Problems:   HTN (hypertension)   Hyperlipemia   Hypothyroidism   GERD (gastroesophageal reflux disease)   NSTEMI (non-ST elevated myocardial infarction) (HCC)   Hemopericardium   Pericardial tamponade   CAD S/P percutaneous coronary angioplasty per cardiology  Failure to thrive, dysphasia: Acute.  Patient noted to have failure to thrive symptoms.  TSH was noted to be at the upper limit of normal.  Complains of difficulty swallowing due to food and liquids getting stuck in her throat.  She reports inability to tolerate Ensure and other thick liquids.  Speech therapy formally consulted to evaluate the patient already. -Agree with speech therapy consult and possible need of modified barium swallow.  -Check prealbumin, T3, and free T4 -Dietary consult for additional recommendations    DVT prophylaxis: Eliquis Code Status: Partial Family Communication:  Disposition Plan: Rehab   Consultants:   PCCM, CTS, electrophysiology    Subjective: Patient reports being unable to swallow due to  feeling like food and liquids get stuck in her throat and makes her feel like she has to vomit.  Objective: Vitals:   05/27/19 0114 05/27/19 0412 05/27/19 0732 05/27/19 0913  BP:  122/61 (!) 117/53   Pulse:  64 62   Resp:  18 (!) 22   Temp:  98 F (36.7 C) 98.2 F (36.8 C)   TempSrc:  Oral Oral   SpO2:  98% 99% 98%  Weight: 59.5 kg     Height:        Intake/Output Summary (Last 24 hours) at 05/27/2019 1431 Last data filed at 05/27/2019 [id number] Gross per 24 hour  Intake 240 ml  Output 1125 ml  Net -885 ml   Filed Weights   05/25/19 0510 05/26/19 0603 05/27/19 0114  Weight: 59 kg 57.5 kg 59.5 kg    Examination:  General exam: Elderly female who appears calm and comfortable  Respiratory system: Patient currently on 1 L nasal cannula oxygen with increased lacrimation but no significant wheezes or rales noted.  . Cardiovascular system: S1 & S2 heard, RRR. No JVD, murmurs, rubs, gallops or clicks. No pedal edema.  Gastrointestinal system: Abdomen is nondistended, soft and nontender. No organomegaly or masses felt. Normal bowel sounds heard. Central nervous system: Alert and oriented. No focal neurological deficits. Extremities: Symmetric 5 x 5 power. Skin: No rashes, lesions or ulcers Psychiatry: Judgement and insight appear normal.    Data Reviewed: I have personally reviewed following labs and imaging studies  CBC: Recent Labs  Lab 05/23/19 0318 05/24/19 1117 05/25/19 0439 05/26/19 0931 05/27/19 0745  WBC 11.1* 12.1* 11.6* 9.7 13.1*  HGB 9.6* 10.9* 9.6* 10.4* 10.8*  HCT 31.3* 34.0* 31.3* 34.2*  33.7*  MCV 95.7 95.5 95.4 96.6 94.4  PLT 204 268 230 268 249   Basic Metabolic Panel: Recent Labs  Lab 05/23/19 0318 05/24/19 1117 05/25/19 0439 05/26/19 0803 05/27/19 0745  NA 139 138 138 137 136  K 3.6 3.6 3.4* 4.3 4.2  CL 98 99 96* 97* 97*  CO2 30 31 31 30 30   GLUCOSE 84 106* 86 113* 106*  BUN 13 19 17 15 18   CREATININE 0.76 0.84 0.87 0.80 0.80  CALCIUM 8.1*  8.4* 8.1* 8.8* 8.4*   GFR: Estimated Creatinine Clearance: 39.5 mL/min (by C-G formula based on SCr of 0.8 mg/dL). Liver Function Tests: Recent Labs  Lab 05/24/19 1117 05/25/19 0439 05/26/19 0803 05/27/19 0745  AST 52* 46* 38 29  ALT 155* 122* 115* 85*  ALKPHOS 64 55 63 63  BILITOT 1.2 0.9 1.1 1.0  PROT 5.0* 4.7* 5.7* 5.1*  ALBUMIN 2.5* 2.4* 2.9* 2.6*   No results for input(s): LIPASE, AMYLASE in the last 168 hours. No results for input(s): AMMONIA in the last 168 hours. Coagulation Profile: No results for input(s): INR, PROTIME in the last 168 hours. Cardiac Enzymes: No results for input(s): CKTOTAL, CKMB, CKMBINDEX, TROPONINI in the last 168 hours. BNP (last 3 results) No results for input(s): PROBNP in the last 8760 hours. HbA1C: No results for input(s): HGBA1C in the last 72 hours. CBG: Recent Labs  Lab 05/24/19 1658  GLUCAP 110*   Lipid Profile: No results for input(s): CHOL, HDL, LDLCALC, TRIG, CHOLHDL, LDLDIRECT in the last 72 hours. Thyroid Function Tests: No results for input(s): TSH, T4TOTAL, FREET4, T3FREE, THYROIDAB in the last 72 hours. Anemia Panel: No results for input(s): VITAMINB12, FOLATE, FERRITIN, TIBC, IRON, RETICCTPCT in the last 72 hours. Sepsis Labs: No results for input(s): PROCALCITON, LATICACIDVEN in the last 168 hours.  Recent Results (from the past 240 hour(s))  Culture, Urine     Status: Abnormal   Collection Time: 05/24/19  4:11 PM   Specimen: Urine, Clean Catch  Result Value Ref Range Status   Specimen Description URINE, CLEAN CATCH  Final   Special Requests   Final    NONE Performed at [facility], [address], [place], [address] [place]    Culture MULTIPLE SPECIES PRESENT, SUGGEST RECOLLECTION (A)  Final   Report Status 05/26/2019 FINAL  Final         Radiology Studies: No results found.      Scheduled Meds: . amiodarone  200 mg Oral Daily  . apixaban  2.5 mg Oral BID  . atorvastatin  40 mg Oral q1800   . clopidogrel  75 mg Oral Daily  . feeding supplement (ENSURE ENLIVE)  237 mL Oral BID BM  . furosemide  20 mg Oral Daily  . guaiFENesin  600 mg Oral BID  . levothyroxine  75 mcg Oral Daily  . loratadine  10 mg Oral Daily  . mouth rinse  15 mL Mouth Rinse BID  . metoprolol succinate  12.5 mg Oral Daily  . mometasone-formoterol  2 puff Inhalation BID  . pantoprazole  40 mg Oral Q0600  . polyethylene glycol  17 g Oral Daily  . senna-docusate  1 tablet Oral Once  . sodium chloride flush  3 mL Intravenous Q12H   Continuous Infusions: . sodium chloride Stopped (05/17/19 2000)     LOS: 17 days    Time spent: 20 mintues    07/26/2019, MD [facility] Pager [phone]  If 7PM-7AM, please contact night-coverage  [email or web address] Password [facility] 05/27/2019, 2:31 PM

## 2019-05-27 NOTE — Progress Notes (Signed)
OT Cancellation Note  Patient Details Name: Amy Chambers MRN: [id number] DOB: 03/28/1928   Cancelled Treatment:    Reason Eval/Treat Not Completed: Other (comment), pt politely declined. Reports having just laid back down and requested therapist to return later today. Will follow and see as able.   Delight Stare, OT Acute Rehabilitation Services Pager [phone] Office [phone]    Delight Stare 05/27/2019, 9:31 AM

## 2019-05-27 NOTE — Care Management Important Message (Signed)
Important Message  Patient Details  Name: RAELEA GOSSE MRN: [id number] Date of Birth: 03/08/1928   Medicare Important Message Given:  Yes     Shelda Altes 05/27/2019, 2:16 PM

## 2019-05-28 LAB — BASIC METABOLIC PANEL
Anion gap: 12 (ref 5–15)
BUN: 19 mg/dL (ref 8–23)
CO2: 27 mmol/L (ref 22–32)
Calcium: 8.4 mg/dL — ABNORMAL LOW (ref 8.9–10.3)
Chloride: 97 mmol/L — ABNORMAL LOW (ref 98–111)
Creatinine, Ser: 0.85 mg/dL (ref 0.44–1.00)
GFR calc Af Amer: >60 mL/min (ref >60)
GFR calc non Af Amer: >60 mL/min (ref >60)
Glucose, Bld: 81 mg/dL (ref 70–99)
Potassium: 4.1 mmol/L (ref 3.5–5.1)
Sodium: 136 mmol/L (ref 135–145)

## 2019-05-28 LAB — CBC
HCT: 34.5 % — ABNORMAL LOW (ref 36.0–46.0)
Hemoglobin: 10.7 g/dL — ABNORMAL LOW (ref 12.0–15.0)
MCH: 29.6 pg (ref 26.0–34.0)
MCHC: 31 g/dL (ref 30.0–36.0)
MCV: 95.6 fL (ref 80.0–100.0)
Platelets: 248 10*3/uL (ref 150–400)
RBC: 3.61 MIL/uL — ABNORMAL LOW (ref 3.87–5.11)
RDW: 18.5 % — ABNORMAL HIGH (ref 11.5–15.5)
WBC: 9.9 10*3/uL (ref 4.0–10.5)
nRBC: 0 % (ref 0.0–0.2)

## 2019-05-28 LAB — T4, FREE: Free T4: 1.21 ng/dL — ABNORMAL HIGH (ref 0.61–1.12)

## 2019-05-28 MED ORDER — NEPRO/CARBSTEADY PO LIQD: 237.0000 mL | Freq: Three times a day (TID) | ORAL | Status: DC

## 2019-05-28 MED ORDER — ENSURE ENLIVE PO LIQD
237.0000 mL | Freq: Two times a day (BID) | ORAL | Status: DC
  Administered 2019-05-28: 15:00:00 237 mL via ORAL

## 2019-05-28 MED ORDER — PANTOPRAZOLE SODIUM 40 MG PO TBEC
40.0000 mg | DELAYED_RELEASE_TABLET | Freq: Two times a day (BID) | ORAL | Status: DC
  Administered 2019-05-28 – 2019-05-31 (×6): 40 mg via ORAL
  Filled 2019-05-28 (×4): qty 1
  Filled 2019-05-28: qty 2
  Filled 2019-05-28: qty 1

## 2019-05-28 MED ORDER — ADULT MULTIVITAMIN W/MINERALS CH
1.0000 | ORAL_TABLET | Freq: Every day | ORAL | Status: DC
  Administered 2019-05-28 – 2019-05-31 (×4): 1 via ORAL
  Filled 2019-05-28 (×4): qty 1

## 2019-05-28 NOTE — Progress Notes (Signed)
[id number] Came to see pt to walk. Pt declined due to nausea. Encouraged pt to walk with staff later when feeling better. Notified Nursing Secretary that pt would like to see RN for something for nausea. Graylon Good RN BSN 05/28/2019 2:45 PM

## 2019-05-28 NOTE — Progress Notes (Signed)
MD Turner stated to hold morning dose of amiodarone, as this is what she believes may be contributing to her bradycardia, Neta Mends RN 7:36 PM 05-28-2019

## 2019-05-28 NOTE — TOC Progression Note (Signed)
Transition of Care [facility]) - Progression Note    Patient Details  Name: Amy Chambers MRN: [id number] Date of Birth: [birth year]  Transition of [facility]) CM/SW [place], [place] Phone Number: [phone] 05/28/2019, 10:12 AM  Clinical Narrative:     Patient has no bed offers at 3 preferences at this time, CSW called and spoke with [facility], [facility] and [facility] reports they should be able to have more information if they are able to extend a bed offer by noon today. [facility] reports they will have bed avail Thursday, CSW to follow up with MD to see if this time frame would work for when patient will be medically ready to dc. Pending response from [facility] at this time.   Expected Discharge Plan: Skilled Nursing Facility Barriers to Discharge: Insurance Authorization  Expected Discharge Plan and Services Expected Discharge Plan: [place] In-house Referral: Clinical Social Work Discharge Planning Services: NA Post Acute Care Choice: [place] Living arrangements for the past 2 months: Single Family Home                 DME Arranged: N/A         HH Arranged: NA HH Agency: NA Date [place]: 05/14/19 Time [place]: 1301 Representative spoke with at [place]: [place] ([place]) Interventions    Readmission Risk Interventions Readmission Risk Prevention Plan 05/14/2019  Transportation Screening Complete  Home Care Screening Complete  Medication Review (RN CM) Complete  Some recent data might be hidden

## 2019-05-28 NOTE — TOC Progression Note (Addendum)
Transition of Care [facility]) - Progression Note    Patient Details  Name: Amy Chambers MRN: [id number] Date of Birth: June 18, 1928  Transition of Care [facility]) CM/SW [place], [place] Phone Number: [phone] 05/28/2019, 1:46 PM  Clinical Narrative:     CSW consulted with patient's daughter regarding bed offers, they [facility]. CSW has informed [facility] of choice, insurance authorization with [facility] has been initiated.   CSW has requested COVID orders be put in by [facility] if patient will be discharging in the next few days.   Expected Discharge Plan: Skilled Nursing Facility Barriers to Discharge: Insurance Authorization  Expected Discharge Plan and Services Expected Discharge Plan: [place] In-house Referral: Clinical Social Work Discharge Planning Services: NA Post Acute Care Choice: [place] Living arrangements for the past 2 months: Single Family Home                 DME Arranged: N/A         HH Arranged: NA HH Agency: NA Date [place]: 05/14/19 Time [place]: 1301 Representative spoke with at [place]: [place] ([place]) Interventions    Readmission Risk Interventions Readmission Risk Prevention Plan 05/14/2019  Transportation Screening Complete  Home Care Screening Complete  Medication Review (RN CM) Complete  Some recent data might be hidden

## 2019-05-28 NOTE — Progress Notes (Signed)
Nutrition Follow-up  RD working remotely.  DOCUMENTATION CODES:   Not applicable  INTERVENTION:   -Continue Magic cup TID with meals, each supplement provides 290 kcal and 9 grams of protein -MVI with minerals daily  -Continue Ensure Enlive po BID, each supplement provides 350 kcal and 20 grams of protein -Downgrade diet to dysphagia 2 (mechanical soft) diet for ease of intake  NUTRITION DIAGNOSIS:   Inadequate oral intake related to inability to eat as evidenced by NPO status.  Progressing; advanced to PO diet and consuming 25-100% of meals  GOAL:   Patient will meet greater than or equal to 90% of their needs  Progressing   MONITOR:   PO intake, Supplement acceptance, Labs, Weight trends, Skin, I & O's  REASON FOR ASSESSMENT:   Consult Assessment of nutrition requirement/status  ASSESSMENT:   85 year old female who presented to the ED on 9/25 with SOB. PMH of COPD, HTN, HLD, GERD. Pt admitted with NSTEMI.  9/29 - left heart cath, CT scan showing new pericardial effusion, s/p pericardial window 10/1 - extubated, diet advanced to 2 gram sodium  Reviewed I/O's: -115 ml x 24 hours and +185 ml since 05/14/19  UOP: 475 ml x 24 hours  Spoke with pt and daughter Amy Chambers) over phone. Pt complains of feeling weak, especially when leaving her bed. She reports variable appetite and complains that she has the feeling the liquids sometimes get stuck in her throat or come back up. Daughter reports concern that she was coughing on foods and liquids yesterday evening. Per daughter, pt consumes a very soft textured diet at home; she has access to dentures but rarely uses them for eating. Both daughter and pt agree that food can be hard for pt to chew at times due to lack of teeth. Both are amenable to diet downgrade to mechanical soft (dysphagia 2).   Pt reports she did better for breakfast and consumed some eggs and grits. Noted meal completion variable; PO: 25-100%.  Per pt, she  does not like the chocolate Ensure supplements, but will sip on vanilla or strawberry flavors if offered. Perpt daughter, pt consumes strawberry Ensure at home.   SLP consult pending. Discussed with pt and daughter that SLP may offer further recommendations and alterations of diet based upon assessment. Discussed importance of good meal and supplement intake to promote healing. Both appreciate RD checking in on them.   Per CSW notes, pt will d/c to SNF once medically stable.   Labs reviewed.    Diet Order:   Diet Order            DIET DYS 2 Room service appropriate? Yes; Fluid consistency: Thin  Diet effective now              EDUCATION NEEDS:   No education needs have been identified at this time  Skin:  Skin Assessment: Skin Integrity Issues: Skin Integrity Issues:: Incisions Incisions: closed incision to chest  Last BM:  05/27/19  Height:   Ht Readings from Last 1 Encounters:  05/19/19 5\' 1"  (1.549 m)    Weight:   Wt Readings from Last 1 Encounters:  05/28/19 57.9 kg    Ideal Body Weight:  47.7 kg  BMI:  Body mass index is 24.12 kg/m.  Estimated Nutritional Needs:   Kcal:  1300-1500  Protein:  70-85 grams  Fluid:  >/= 1.3 L    Amy Chambers A. Amy Chambers, RD, LDN, [place] Registered Dietitian II Certified Diabetes Care and Education Specialist Pager: [phone] After hours  Pager: [phone]

## 2019-05-28 NOTE — Progress Notes (Signed)
PROGRESS NOTE    Amy Chambers  DEY:[id number] DOB: 11-May-1928 DOA: 05/10/2019 PCP: Chevis Pretty, [name]   Brief Narrative: 85 year old with past medical history significant for hypertension, hypothyroidism, COPD and GERD who initially presented for acute onset of shortness of breath with hypoxia.  Found to have non-STEMI for which left heart cath revealed proximal RCA stenosis.  Intervention was complicated by perforation with subsequent tamponade and hemopericardium.  Cardiothoracic surgery was consulted and patient underwent pericardial washout on 9/29.  Postoperative noted to have A. fib with pulses and electrophysiology was consulted.  Currently patient noted to have poor oral intake and failure to thrive.  Currently evaluated for inpatient rehab.   Assessment & Plan:   Principal Problem:   Acute respiratory failure with hypoxia and hypercapnia (HCC) Active Problems:   HTN (hypertension)   Hyperlipemia   Hypothyroidism   GERD (gastroesophageal reflux disease)   NSTEMI (non-ST elevated myocardial infarction) (HCC)   Hemopericardium   Pericardial tamponade   CAD S/P percutaneous coronary angioplasty   1-failure to thrive, dysphagia: TSH at the upper limit of normal. Free T3 pending, free T4 elevated at 1.2. depending on free t3 level could decrease synthroid dose.  Appreciated speech therapy evaluation on dysphagia diet. We will order breeze.  Change PPI to BID.  Continue to have nausea, change PPI BID. [place] unrevealing. If persist consider GI eval.   2-non-STEMI status post PCI of proximal RCA: Plavix aspirin and Eliquis Per cardiology  3-acute combined heart failure: On p.o. Lasix.  Per cardiology  4-paroxysmal A. fib with SSS Evaluated by EP started on amiodarone dose decreased due to nausea Continue with Eliquis  5-acute blood loss anemia/hemopericardium secondary to coronary perforation Status post surgery 9/30 with 250 cc blood evacuated. Last echo on 10/1  showed no effusion.  Due to follow-up per cardiology  6-transaminases mild; elevation ultrasound unrevealing follow trend trendig down.    Nutrition Problem: Inadequate oral intake Etiology: inability to eat    Signs/Symptoms: NPO status    Interventions: Ensure Enlive (each supplement provides 350kcal and 20 grams of protein), Magic cup  Estimated body mass index is 24.12 kg/m as calculated from the following:   Height as of this encounter: 5\' 1"  (1.549 m).   Weight as of this encounter: 57.9 kg.   DVT prophylaxis: Eliquis,  Code Status: Discussed with patient and daughter at bedside. Patient wishes to be DNR. I have change order.  Family Communication: Daughter at bedside.  Disposition Plan: home when stable.     Procedures:   ECHo  Antimicrobials:    Subjective: Feels tired, denies worsening dyspnea.    Objective: Vitals:   05/27/19 2043 05/28/19 0051 05/28/19 0513 05/28/19 0805  BP: (!) 125/48 (!) 146/46 (!) 139/55 (!) 143/69  Pulse: 68 62 (!) 59 60  Resp: 16  16 20   Temp: 97.7 F (36.5 C) 98 F (36.7 C) 98.1 F (36.7 C) 98.5 F (36.9 C)  TempSrc: Oral Oral Oral Oral  SpO2: 99% 99% 98% 97%  Weight:   57.9 kg   Height:        Intake/Output Summary (Last 24 hours) at 05/28/2019 0946 Last data filed at 05/28/2019 0945 Gross per 24 hour  Intake 480 ml  Output 450 ml  Net 30 ml   Filed Weights   05/26/19 0603 05/27/19 0114 05/28/19 0513  Weight: 57.5 kg 59.5 kg 57.9 kg    Examination:  General exam: Appears calm and comfortable  Respiratory system: Clear to auscultation. Respiratory effort  normal. Cardiovascular system: S1 & S2 heard, RRR. No JVD, murmurs, rubs, gallops or clicks. No pedal edema. Gastrointestinal system: Abdomen is nondistended, soft and nontender. No organomegaly or masses felt. Normal bowel sounds heard. Central nervous system: Alert and oriented. Extremities: Symmetric 5 x 5 power. Skin: No rashes, lesions or ulcers    Data Reviewed: I have personally reviewed following labs and imaging studies  CBC: Recent Labs  Lab 05/24/19 1117 05/25/19 0439 05/26/19 0931 05/27/19 0745 05/28/19 0402  WBC 12.1* 11.6* 9.7 13.1* 9.9  HGB 10.9* 9.6* 10.4* 10.8* 10.7*  HCT 34.0* 31.3* 34.2* 33.7* 34.5*  MCV 95.5 95.4 96.6 94.4 95.6  PLT 268 230 268 249 248   Basic Metabolic Panel: Recent Labs  Lab 05/24/19 1117 05/25/19 0439 05/26/19 0803 05/27/19 0745 05/28/19 0402  NA 138 138 137 136 136  K 3.6 3.4* 4.3 4.2 4.1  CL 99 96* 97* 97* 97*  CO2 31 31 30 30 27   GLUCOSE 106* 86 113* 106* 81  BUN 19 17 15 18 19   CREATININE 0.84 0.87 0.80 0.80 0.85  CALCIUM 8.4* 8.1* 8.8* 8.4* 8.4*   GFR: Estimated Creatinine Clearance: 36.7 mL/min (by C-G formula based on SCr of 0.85 mg/dL). Liver Function Tests: Recent Labs  Lab 05/24/19 1117 05/25/19 0439 05/26/19 0803 05/27/19 0745  AST 52* 46* 38 29  ALT 155* 122* 115* 85*  ALKPHOS 64 55 63 63  BILITOT 1.2 0.9 1.1 1.0  PROT 5.0* 4.7* 5.7* 5.1*  ALBUMIN 2.5* 2.4* 2.9* 2.6*   No results for input(s): LIPASE, AMYLASE in the last 168 hours. No results for input(s): AMMONIA in the last 168 hours. Coagulation Profile: No results for input(s): INR, PROTIME in the last 168 hours. Cardiac Enzymes: No results for input(s): CKTOTAL, CKMB, CKMBINDEX, TROPONINI in the last 168 hours. BNP (last 3 results) No results for input(s): PROBNP in the last 8760 hours. HbA1C: No results for input(s): HGBA1C in the last 72 hours. CBG: Recent Labs  Lab 05/24/19 1658  GLUCAP 110*   Lipid Profile: No results for input(s): CHOL, HDL, LDLCALC, TRIG, CHOLHDL, LDLDIRECT in the last 72 hours. Thyroid Function Tests: Recent Labs    05/28/19 0402  FREET4 1.21*   Anemia Panel: No results for input(s): VITAMINB12, FOLATE, FERRITIN, TIBC, IRON, RETICCTPCT in the last 72 hours. Sepsis Labs: No results for input(s): PROCALCITON, LATICACIDVEN in the last 168 hours.  Recent Results  (from the past 240 hour(s))  Culture, Urine     Status: Abnormal   Collection Time: 05/24/19  4:11 PM   Specimen: Urine, Clean Catch  Result Value Ref Range Status   Specimen Description URINE, CLEAN CATCH  Final   Special Requests   Final    NONE Performed at [facility], [address] [place], [place] [address]    Culture MULTIPLE SPECIES PRESENT, SUGGEST RECOLLECTION (A)  Final   Report Status 05/26/2019 FINAL  Final         Radiology Studies: No results found.      Scheduled Meds: . amiodarone  200 mg Oral Daily  . apixaban  2.5 mg Oral BID  . atorvastatin  40 mg Oral q1800  . clopidogrel  75 mg Oral Daily  . feeding supplement (ENSURE ENLIVE)  237 mL Oral BID BM  . furosemide  20 mg Oral Daily  . guaiFENesin  600 mg Oral BID  . levothyroxine  75 mcg Oral Daily  . loratadine  10 mg Oral Daily  . mouth  rinse  15 mL Mouth Rinse BID  . metoprolol succinate  12.5 mg Oral Daily  . mometasone-formoterol  2 puff Inhalation BID  . pantoprazole  40 mg Oral Q0600  . polyethylene glycol  17 g Oral Daily  . sodium chloride flush  3 mL Intravenous Q12H   Continuous Infusions: . sodium chloride Stopped (05/17/19 2000)     LOS: 18 days    Time spent: 35 minutes    Alba Cory, MD [facility] Pager [phone]  If 7PM-7AM, please contact night-coverage [email or web address] Password [email or web address] 05/28/2019, 9:46 AM

## 2019-05-28 NOTE — Evaluation (Signed)
Clinical/Bedside Swallow Evaluation Patient Details  Name: Amy Chambers MRN: [id number] Date of Birth: [birth year]  Today's Date: 05/28/2019 Time: SLP Start Time (ACUTE ONLY): 4098 SLP Stop Time (ACUTE ONLY): 0933 SLP Time Calculation (min) (ACUTE ONLY): 11 min  Past Medical History:  Past Medical History:  Diagnosis Date  . Cataract   . COPD (chronic obstructive pulmonary disease) (HCC)   . GERD (gastroesophageal reflux disease)   . Hypertension   . Osteopenia   . Thyroid disease    Past Surgical History:  Past Surgical History:  Procedure Laterality Date  . CATARACT EXTRACTION, BILATERAL    . CORONARY STENT INTERVENTION N/A 05/14/2019   Procedure: CORONARY STENT INTERVENTION;  Surgeon: Lennette Bihari, MD;  Location: [facility] INVASIVE CV LAB;  Service: Cardiovascular;  Laterality: N/A;  . LEFT HEART CATH AND CORONARY ANGIOGRAPHY N/A 05/14/2019   Procedure: LEFT HEART CATH AND CORONARY ANGIOGRAPHY;  Surgeon: Marykay Lex, MD;  Location: [facility] INVASIVE CV LAB;  Service: Cardiovascular;  Laterality: N/A;  . LEFT HEART CATH AND CORONARY ANGIOGRAPHY N/A 05/14/2019   Procedure: LEFT HEART CATH AND CORONARY ANGIOGRAPHY;  Surgeon: Lennette Bihari, MD;  Location: [facility] INVASIVE CV LAB;  Service: Cardiovascular;  Laterality: N/A;  . MEDIASTINAL EXPLORATION N/A 05/14/2019   Procedure: Mediastinal Exploration with Washout;  Surgeon: Corliss Skains, MD;  Location: [facility] OR;  Service: Open Heart Surgery;  Laterality: N/A;   HPI:  85 yo female with onset of hypoxia, hypercapnia and acute respiratory failure was admitted, may have been a medication reaction.  Cleared for PE, Covid (-).  PMHx:  thyroid disease, COPD, HTN, atherosclerosis carotids, bronchitis, underwent cath on 9/29 and developed post procedure hypotension found to have pericardial effusion with concern for tampanade due to coronary bleed.  Underwent emergent sternotomy, and evacuation of pericardial effusion.  Extubated 10/1. SLP ordered due  to pt report of regurgitation.   Assessment / Plan / Recommendation Clinical Impression  Pt would only consume small amounts of thin liquids, but did so with a functional appearing oropharyngeal swallow. She describes a h/o reflux PTA with occasional regurgitation, although perhaps with an increase of symptoms over the last few days. If her symptoms primarily include regurgitation, it may be more appropriate to investigate a possible esophageal dysphagia. However, given that limited clinical observation could be done today, SLP will f/u briefly for ongoing differential diagnosis.   SLP Visit Diagnosis: Dysphagia, unspecified (R13.10)    Aspiration Risk  Mild aspiration risk    Diet Recommendation Regular;Thin liquid   Liquid Administration via: Cup;Straw Medication Administration: Whole meds with liquid Supervision: Patient able to self feed;Intermittent supervision to cue for compensatory strategies Compensations: Slow rate;Small sips/bites Postural Changes: Seated upright at 90 degrees;Remain upright for at least 30 minutes after po intake    Other  Recommendations Recommended Consults: Consider esophageal assessment Oral Care Recommendations: Oral care BID   Follow up Recommendations (tba)      Frequency and Duration min 2x/week  2 weeks       Prognosis Prognosis for Safe Diet Advancement: Good      Swallow Study   General HPI: 85 yo female with onset of hypoxia, hypercapnia and acute respiratory failure was admitted, may have been a medication reaction.  Cleared for PE, Covid (-).  PMHx:  thyroid disease, COPD, HTN, atherosclerosis carotids, bronchitis, underwent cath on 9/29 and developed post procedure hypotension found to have pericardial effusion with concern for tampanade due to coronary bleed.  Underwent emergent sternotomy, and evacuation  of pericardial effusion.  Extubated 10/1. SLP ordered due to pt report of regurgitation. Type of Study: Bedside Swallow  Evaluation Previous Swallow Assessment: none in chart Diet Prior to this Study: Regular;Thin liquids Temperature Spikes Noted: No Respiratory Status: Room air History of Recent Intubation: (briefly earlier in admission) Behavior/Cognition: Alert;Cooperative Oral Cavity Assessment: Within Functional Limits Oral Care Completed by SLP: No Vision: Functional for self-feeding Self-Feeding Abilities: Able to feed self Patient Positioning: Upright in bed Baseline Vocal Quality: Normal Volitional Swallow: Able to elicit    Oral/Motor/Sensory Function Overall Oral Motor/Sensory Function: Within functional limits   Ice Chips Ice chips: Not tested   Thin Liquid Thin Liquid: Within functional limits Presentation: Self Fed;Straw    Nectar Thick Nectar Thick Liquid: Not tested   Honey Thick Honey Thick Liquid: Not tested   Puree Puree: Not tested   Solid     Solid: Not tested      [name] Caffie Sotto 05/28/2019,10:33 AM   Pollyann Glen, M.A. [place] [facility] Environmental education officer [phone] Office [phone]

## 2019-05-28 NOTE — Progress Notes (Addendum)
Progress Note  Patient Name: Amy Chambers Date of Encounter: 05/28/2019  Primary Cardiologist: Evalina Field, MD   Subjective   Still very weak and gets SOB w/ exertion  Inpatient Medications    Scheduled Meds: . amiodarone  200 mg Oral Daily  . apixaban  2.5 mg Oral BID  . atorvastatin  40 mg Oral q1800  . clopidogrel  75 mg Oral Daily  . feeding supplement (ENSURE ENLIVE)  237 mL Oral BID BM  . furosemide  20 mg Oral Daily  . guaiFENesin  600 mg Oral BID  . levothyroxine  75 mcg Oral Daily  . loratadine  10 mg Oral Daily  . mouth rinse  15 mL Mouth Rinse BID  . metoprolol succinate  12.5 mg Oral Daily  . mometasone-formoterol  2 puff Inhalation BID  . pantoprazole  40 mg Oral Q0600  . polyethylene glycol  17 g Oral Daily  . sodium chloride flush  3 mL Intravenous Q12H   Continuous Infusions: . sodium chloride Stopped (05/17/19 2000)   PRN Meds: sodium chloride, acetaminophen, albuterol, diphenhydrAMINE-zinc acetate, morphine injection, ondansetron **OR** ondansetron (ZOFRAN) IV, oxyCODONE, sodium chloride flush, traMADol, traZODone   Vital Signs    Vitals:   05/27/19 2043 05/28/19 0051 05/28/19 0513 05/28/19 0805  BP: (!) 125/48 (!) 146/46 (!) 139/55 (!) 143/69  Pulse: 68 62 (!) 59 60  Resp: 16  16 20   Temp: 97.7 F (36.5 C) 98 F (36.7 C) 98.1 F (36.7 C) 98.5 F (36.9 C)  TempSrc: Oral Oral Oral Oral  SpO2: 99% 99% 98% 97%  Weight:   57.9 kg   Height:        Intake/Output Summary (Last 24 hours) at 05/28/2019 1021 Last data filed at 05/28/2019 0945 Gross per 24 hour  Intake 480 ml  Output 450 ml  Net 30 ml   Last 3 Weights 05/28/2019 05/27/2019 05/26/2019  Weight (lbs) 127 lb 10.3 oz 131 lb 2.8 oz 126 lb 12.2 oz  Weight (kg) 57.9 kg 59.5 kg 57.5 kg      Telemetry    SR, ST  - Personally Reviewed  ECG    No new - Personally Reviewed  Physical Exam   General: Well developed, well nourished, female in no acute distress Head: Eyes  PERRLA Normocephalic and atraumatic Lungs: Clear bilaterally to auscultation. Heart: HRRR S1 S2, without rub or gallop. No murmur.  Pulses are 2+ & equal. No JVD. Abdomen: Bowel sounds are present, abdomen soft and non-tender without masses or  hernias noted. Msk: Normal strength and tone for age. Extremities: No clubbing, cyanosis or edema.    Skin:  No rashes or lesions noted. Neuro: Alert and oriented X 3. Psych:  Good affect, responds appropriately  Labs    High Sensitivity Troponin:   Recent Labs  Lab 05/10/19 1303 05/10/19 1527 05/10/19 1906 05/12/19 0653 05/12/19 0908  TROPONINIHS 44* 499* 975* 1,563* 1,288*      Chemistry Recent Labs  Lab 05/25/19 0439 05/26/19 0803 05/27/19 0745 05/28/19 0402  NA 138 137 136 136  K 3.4* 4.3 4.2 4.1  CL 96* 97* 97* 97*  CO2 31 30 30 27   GLUCOSE 86 113* 106* 81  BUN 17 15 18 19   CREATININE 0.87 0.80 0.80 0.85  CALCIUM 8.1* 8.8* 8.4* 8.4*  PROT 4.7* 5.7* 5.1*  --   ALBUMIN 2.4* 2.9* 2.6*  --   AST 46* 38 29  --   ALT 122* 115* 85*  --  ALKPHOS 55 63 63  --   BILITOT 0.9 1.1 1.0  --   GFRNONAA 59* >60 >60 >60  GFRAA >60 >60 >60 >60  ANIONGAP 11 10 9 12      Hematology Recent Labs  Lab 05/26/19 0931 05/27/19 0745 05/28/19 0402  WBC 9.7 13.1* 9.9  RBC 3.54* 3.57* 3.61*  HGB 10.4* 10.8* 10.7*  HCT 34.2* 33.7* 34.5*  MCV 96.6 94.4 95.6  MCH 29.4 30.3 29.6  MCHC 30.4 32.0 31.0  RDW 17.8* 18.0* 18.5*  PLT 268 249 248   Lab Results  Component Value Date   CHOL 204 (H) 08/30/2018   HDL 46 08/30/2018   LDLCALC 130 (H) 08/30/2018   TRIG 140 08/30/2018   CHOLHDL 4.4 08/30/2018    Radiology    No results found.  Cardiac Studies   Echo 05/16/19 IMPRESSIONS  1. Left ventricular ejection fraction, by visual estimation, is 55 to 60%. The left ventricle has normal function. Left ventricular septal wall thickness was mildly increased. There is mildly increased left ventricular hypertrophy.  2. Inferior basal  hypokinesis but overal EF preserved.  3. Global right ventricle has normal systolic function.The right ventricular size is normal. No increase in right ventricular wall thickness.  4. Left atrial size was normal.  5. Right atrial size was mildly dilated.  6. Moderate calcification of the mitral valve leaflet(s).  7. Moderate mitral annular calcification.  8. Moderate thickening of the mitral valve leaflet(s).  9. The mitral valve is normal in structure. No evidence of mitral valve regurgitation. No evidence of mitral stenosis. 10. The tricuspid valve is normal in structure. Tricuspid valve regurgitation is mild. 11. The aortic valve is tricuspid Aortic valve regurgitation was not visualized by color flow Doppler. Mild to moderate aortic valve sclerosis/calcification without any evidence of aortic stenosis. 12. The pulmonic valve was not well visualized. Pulmonic valve regurgitation was not assessed by color flow Doppler. 13. Post pericardial window with no tamponade or residual effuson. 14. The interatrial septum was not well visualized.  FINDINGS  Left Ventricle: Left ventricular ejection fraction, by visual estimation, is 55 to 60%. The left ventricle has normal function. Left ventricular septal wall thickness was mildly increased. There is mildly increased left ventricular hypertrophy. Inferior  basal hypokinesis but overal EF preserved.   Patient Profile     85 [place] with history of COPD, HTN, and GERD who was initially admitted with COPD exacerbation with hypoxic respiratory failure. It was unclear if there was perhaps a component of respiratory failure from allergic reaction to Augmentin. She was treated with steroids, antibiotics, and supplemental O2. As part of her workup she was also found to have NSTEMI with hsTroponin peak of 1563 and 2D echo with suspected ischemic cardiomyopathy with EF 45-50% and mild hypokinesis of the basal to mid inferoseptum/inferior wall  concerning for RCA ischemia/infarction.   She underwent cath 05/14/19 with PTCA/DES to proximal RCA stenosis. The intervention was complicated by perforation with tamponade and hemopericardium.   She was taken to the OR 9/29 for mediastinal pericardial washout. Post-op course was complicated by postoperative paroxysmal atrial fibrillation with intermittent post-conversion pauses up to 3.7 seconds. She was seen by EP, loaded with amiodarone, and metoprolol was reduced. She was not felt to require PPM.  Assessment & Plan    1. NSTEMI/CAD s/p PCI of the proximal RCA -  - continue Plavix, no ASA 2nd Eliquis - BB held due to low BP, restarted at decreased dose - BP improved,  but HR low at times  2. Acute combined CHF -  - EF previously decreased, normal by echo 10/01 - CHF improved, now on po Lasix  - I/O net +275 cc since admit - wt 52.7 kg>>64.4 kg>>57.9 kg today - 10/09 CXR w/ small layering effusions, emphysema - cardiopericardial silhouette enlarged, similar to prior CT  3. AECOPD with acute hypoxic respiratory failure -  - s/p 5 days steroids and ABX - on Dulera & prn albuterol (per CCM recs) -She was not on home O2 prior to admission -Need to see how she does off O2, O2 sats dropped to 91% yesterday when working with PT  4. Paroxymal atrial fibrillation with SSS  - has had on/off since surgery - post-termination pauses up to 3.7 sec - seen by EP, amio loaded but decreased to 200 mg qd on 10/09 due to nausea - continue Eliquis 2.5 mg bid  5. Acute blood loss anemia/hemopericardium 2/2 coronary perforation -  - s/p surgery 09/30 w/ 250 cc blood evacuated - chest tube out 10/02 - H&H are stable -Last echo was 10/1 and showed no effusion, MD advise on when this should be repeated  6. Thrombocytopenia -  - felt 2nd surgery and acute illness - nadir 78, resolved  7. Deconditioning -  - Plan is for SNF  8. Transaminitis:  -Mild elevation, improving - Ultrasound  unremarkable - Likely related to the stress of acute illness -Since on amiodarone, continue to follow  9. Rash:  No complaints at this time  10.  Dysphagia -Speech has evaluated, it may be that her reflux symptoms have increased over the last few days and the dysphagia may be esophageal in origin. -SLP will continue to follow     For questions or updates, please contact [facility] Please consult [email or web address] for contact info under     Signed, Theodore Demark, PA-C  05/28/2019, 10:21 AM     Patient seen and examined. Agree with assessment and plan.  Had swallowing assessment today with overall oral motor/sensory function within functional limits.  May need to consider possible esophageal dysphagia in the etiology of her swallowing issues.  No chest pain.  She felt somewhat sick to stomach after eating lunch today.  Decreased BS without wheezing c/w COPD. Plan is for SNF. F/U Cmet in am   Lennette Bihari, MD, [facility] 05/28/2019 6:29 PM

## 2019-05-28 NOTE — Progress Notes (Signed)
Turner paged regarding patients low heart rate sustaining, awaiting callback , pt is currently asymptomatic  Neta Mends RN 7:06 PM 05-28-2019

## 2019-05-29 DIAGNOSIS — J441 Chronic obstructive pulmonary disease with (acute) exacerbation: Secondary | ICD-10-CM | POA: Diagnosis present

## 2019-05-29 DIAGNOSIS — I480 Paroxysmal atrial fibrillation: Secondary | ICD-10-CM | POA: Diagnosis not present

## 2019-05-29 DIAGNOSIS — J9601 Acute respiratory failure with hypoxia: Secondary | ICD-10-CM | POA: Diagnosis present

## 2019-05-29 DIAGNOSIS — Z955 Presence of coronary angioplasty implant and graft: Secondary | ICD-10-CM

## 2019-05-29 LAB — COMPREHENSIVE METABOLIC PANEL
ALT: 61 U/L — ABNORMAL HIGH (ref 0–44)
AST: 26 U/L (ref 15–41)
Albumin: 2.7 g/dL — ABNORMAL LOW (ref 3.5–5.0)
Alkaline Phosphatase: 62 U/L (ref 38–126)
Anion gap: 9 (ref 5–15)
BUN: 20 mg/dL (ref 8–23)
CO2: 30 mmol/L (ref 22–32)
Calcium: 8.4 mg/dL — ABNORMAL LOW (ref 8.9–10.3)
Chloride: 94 mmol/L — ABNORMAL LOW (ref 98–111)
Creatinine, Ser: 0.91 mg/dL (ref 0.44–1.00)
GFR calc Af Amer: >60 mL/min (ref >60)
GFR calc non Af Amer: 56 mL/min — ABNORMAL LOW (ref >60)
Glucose, Bld: 80 mg/dL (ref 70–99)
Potassium: 4.6 mmol/L (ref 3.5–5.1)
Sodium: 133 mmol/L — ABNORMAL LOW (ref 135–145)
Total Bilirubin: 1.1 mg/dL (ref 0.3–1.2)
Total Protein: 5.2 g/dL — ABNORMAL LOW (ref 6.5–8.1)

## 2019-05-29 LAB — T3, FREE: T3, Free: 1.1 pg/mL — ABNORMAL LOW (ref 2.0–4.4)

## 2019-05-29 MED ORDER — AMIODARONE HCL 100 MG PO TABS
100.0000 mg | ORAL_TABLET | Freq: Every day | ORAL | Status: DC
  Administered 2019-05-29 – 2019-05-30 (×2): 100 mg via ORAL
  Filled 2019-05-29 (×2): qty 1

## 2019-05-29 MED ORDER — AMIODARONE HCL 100 MG PO TABS: 100.0000 mg | ORAL_TABLET | Freq: Every day | ORAL | Status: DC

## 2019-05-29 NOTE — Progress Notes (Addendum)
PROGRESS NOTE    Amy Chambers  KZS:[id number] DOB: 03-27-1928 DOA: 05/10/2019 PCP: Chevis Pretty, [name]  Brief Narrative: 85 year old with past medical history significant for hypertension, hypothyroidism, COPD and GERD who initially presented for acute onset of shortness of breath with hypoxia.  Found to have non-STEMI for which left heart cath revealed proximal RCA stenosis.  Intervention was complicated by perforation with subsequent tamponade and hemopericardium.  Cardiothoracic surgery was consulted and patient underwent pericardial washout on 9/29.  Postoperative noted to have A. fib with pulses and electrophysiology was consulted.  Currently patient noted to have poor oral intake and failure to thrive.  Currently evaluated for inpatient rehab.   Assessment & Plan:   Principal Problem:   Acute respiratory failure with hypoxia and hypercapnia (HCC) Active Problems:   HTN (hypertension)   Hyperlipemia   Hypothyroidism   GERD (gastroesophageal reflux disease)   NSTEMI (non-ST elevated myocardial infarction) ([place])   Hemopericardium   Pericardial tamponade   CAD S/P percutaneous coronary angioplasty    #1 NSTEMI s/p PCI of proximal RCA on Plavix and Eliquis no aspirin per cardiology  #2 acute combined heart failure on Lasix  #3 paroxysmal A. fib with sick sinus syndrome on amiodarone.  She was bradycardic last night amiodarone was on hold.  #4 acute blood loss anemia with hemopericardium secondary to coronary perforation surgery 05/15/2019 with 250 cc of blood evacuated follow-up echo 05/16/2019 showed no effusion per cardiology.  #5 mild elevated transaminases improving.  Ultrasound no biliary sludge.  #6 hypothyroidism continue current dose of Synthroid.  #7 nausea unclear etiology PPI dose increased to twice daily 05/28/2019 follow-up.     Nutrition Problem: Inadequate oral intake Etiology: inability to eat     Signs/Symptoms: NPO status    Interventions:  Ensure Enlive (each supplement provides 350kcal and 20 grams of protein), Magic cup  Estimated body mass index is 23.62 kg/m as calculated from the following:   Height as of this encounter: 5\' 1"  (1.549 m).   Weight as of this encounter: 56.7 kg.  DVT prophylaxis: Eliquis  code Status: DO NOT RESUSCITATE  family Communication: None at bedside  disposition Plan: Per cardiology, pending SNF    Subjective: Patient resting in bed reports she has an appetite to eat breakfast feels nausea is better  Objective: Vitals:   05/29/19 0050 05/29/19 0355 05/29/19 0450 05/29/19 0750  BP: (!) 116/39 (!) 120/49    Pulse: (!) 59 60  60  Resp: 19 19  18   Temp: 97.6 F (36.4 C) 98.1 F (36.7 C)    TempSrc: Oral Oral    SpO2: 95% 94%  95%  Weight:   56.7 kg   Height:        Intake/Output Summary (Last 24 hours) at 05/29/2019 1050 Last data filed at 05/29/2019 0500 Gross per 24 hour  Intake 706 ml  Output 925 ml  Net -219 ml   Filed Weights   05/27/19 0114 05/28/19 0513 05/29/19 0450  Weight: 59.5 kg 57.9 kg 56.7 kg    Examination:  General exam: Appears calm and comfortable  Respiratory system: Clear to auscultation. Respiratory effort normal. Cardiovascular system: S1 & S2 heard, RRR. No JVD, murmurs, rubs, gallops or clicks. No pedal edema. Gastrointestinal system: Abdomen is nondistended, soft and nontender. No organomegaly or masses felt. Normal bowel sounds heard. Central nervous system: Alert and oriented. No focal neurological deficits. Extremities: No edema  skin: No rashes, lesions or ulcers Psychiatry: Judgement and insight appear normal. Mood &  affect appropriate.     Data Reviewed: I have personally reviewed following labs and imaging studies  CBC: Recent Labs  Lab 05/24/19 1117 05/25/19 0439 05/26/19 0931 05/27/19 0745 05/28/19 0402  WBC 12.1* 11.6* 9.7 13.1* 9.9  HGB 10.9* 9.6* 10.4* 10.8* 10.7*  HCT 34.0* 31.3* 34.2* 33.7* 34.5*  MCV 95.5 95.4 96.6  94.4 95.6  PLT 268 230 268 249 248   Basic Metabolic Panel: Recent Labs  Lab 05/25/19 0439 05/26/19 0803 05/27/19 0745 05/28/19 0402 05/29/19 0545  NA 138 137 136 136 133*  K 3.4* 4.3 4.2 4.1 4.6  CL 96* 97* 97* 97* 94*  CO2 31 30 30 27 30   GLUCOSE 86 113* 106* 81 80  BUN 17 15 18 19 20   CREATININE 0.87 0.80 0.80 0.85 0.91  CALCIUM 8.1* 8.8* 8.4* 8.4* 8.4*   GFR: Estimated Creatinine Clearance: 31.6 mL/min (by C-G formula based on SCr of 0.91 mg/dL). Liver Function Tests: Recent Labs  Lab 05/24/19 1117 05/25/19 0439 05/26/19 0803 05/27/19 0745 05/29/19 0545  AST 52* 46* 38 29 26  ALT 155* 122* 115* 85* 61*  ALKPHOS 64 55 63 63 62  BILITOT 1.2 0.9 1.1 1.0 1.1  PROT 5.0* 4.7* 5.7* 5.1* 5.2*  ALBUMIN 2.5* 2.4* 2.9* 2.6* 2.7*   No results for input(s): LIPASE, AMYLASE in the last 168 hours. No results for input(s): AMMONIA in the last 168 hours. Coagulation Profile: No results for input(s): INR, PROTIME in the last 168 hours. Cardiac Enzymes: No results for input(s): CKTOTAL, CKMB, CKMBINDEX, TROPONINI in the last 168 hours. BNP (last 3 results) No results for input(s): PROBNP in the last 8760 hours. HbA1C: No results for input(s): HGBA1C in the last 72 hours. CBG: Recent Labs  Lab 05/24/19 1658  GLUCAP 110*   Lipid Profile: No results for input(s): CHOL, HDL, LDLCALC, TRIG, CHOLHDL, LDLDIRECT in the last 72 hours. Thyroid Function Tests: Recent Labs    05/28/19 0402  FREET4 1.21*   Anemia Panel: No results for input(s): VITAMINB12, FOLATE, FERRITIN, TIBC, IRON, RETICCTPCT in the last 72 hours. Sepsis Labs: No results for input(s): PROCALCITON, LATICACIDVEN in the last 168 hours.  Recent Results (from the past 240 hour(s))  Culture, Urine     Status: Abnormal   Collection Time: 05/24/19  4:11 PM   Specimen: Urine, Clean Catch  Result Value Ref Range Status   Specimen Description URINE, CLEAN CATCH  Final   Special Requests   Final    NONE  Performed at [facility], [address] [place], [place] [address]    Culture MULTIPLE SPECIES PRESENT, SUGGEST RECOLLECTION (A)  Final   Report Status 05/26/2019 FINAL  Final         Radiology Studies: No results found.      Scheduled Meds: . amiodarone  200 mg Oral Daily  . apixaban  2.5 mg Oral BID  . atorvastatin  40 mg Oral q1800  . clopidogrel  75 mg Oral Daily  . feeding supplement (ENSURE ENLIVE)  237 mL Oral BID BM  . feeding supplement (NEPRO CARB STEADY)  237 mL Oral TID BM  . furosemide  20 mg Oral Daily  . guaiFENesin  600 mg Oral BID  . levothyroxine  75 mcg Oral Daily  . loratadine  10 mg Oral Daily  . mouth rinse  15 mL Mouth Rinse BID  . metoprolol succinate  12.5 mg Oral Daily  . mometasone-formoterol  2 puff Inhalation BID  . multivitamin with  minerals  1 tablet Oral Daily  . pantoprazole  40 mg Oral BID  . polyethylene glycol  17 g Oral Daily  . sodium chloride flush  3 mL Intravenous Q12H   Continuous Infusions: . sodium chloride Stopped (05/17/19 2000)     LOS: 19 days     Alwyn Ren, MD [facility]  If 7PM-7AM, please contact night-coverage [email or web address] Password [email or web address] 05/29/2019, 10:50 AM

## 2019-05-29 NOTE — TOC Progression Note (Addendum)
Transition of Care [facility]) - Progression Note    Patient Details  Name: Amy Chambers MRN: [id number] Date of Birth: Nov 18, 1927  Transition of Care [facility]) CM/SW [place],  Phone Number: [phone] 05/29/2019, 8:50 AM  Clinical Narrative:     Patient has insurance authorization, pending COVID Test results at this time in order to discharge to [facility].  Expected Discharge Plan: Skilled Nursing Facility Barriers to Discharge: Insurance Authorization  Expected Discharge Plan and Services Expected Discharge Plan: [place] In-house Referral: Clinical Social Work Discharge Planning Services: NA Post Acute Care Choice: [place] Living arrangements for the past 2 months: Single Family Home                 DME Arranged: N/A         HH Arranged: NA HH Agency: NA Date [place]: 05/14/19 Time [place]: 1301 Representative spoke with at [place]: [place] ([place]) Interventions    Readmission Risk Interventions Readmission Risk Prevention Plan 05/14/2019  Transportation Screening Complete  Home Care Screening Complete  Medication Review (RN CM) Complete  Some recent data might be hidden

## 2019-05-29 NOTE — Progress Notes (Signed)
CARDIAC REHAB PHASE I   PRE:  Rate/Rhythm: 64 SR    BP: sitting 119/62    SaO2: 96 RA  MODE:  Ambulation: 72 ft   POST:  Rate/Rhythm: 79 SR    BP: sitting 140/78     SaO2: 94 RA  Pt lying down, wanting a nap. Able to turn and sit up half way, needed mod assist to sit up all the way. Able to walk with RW, assist x2 for safety. Legs wobbly/weak and c/o right knee burning pain with standing. This was better with distance. Main c/o is fatigue and SOB with distance. She has significant COPD that causes DOE at baseline. To recliner. Encouraged recliner and IS. Daughter present. Notified NT as bed had been wet. Will f/u tomorrow as time allows. [place], ACSM 05/29/2019 2:26 PM

## 2019-05-29 NOTE — Progress Notes (Addendum)
  Speech Language Pathology Treatment: Dysphagia  Patient Details Name: Amy Chambers MRN: [id number] DOB: 1928/03/20 Today's Date: 05/29/2019 Time: 0850-0900 SLP Time Calculation (min) (ACUTE ONLY): 10 min  Assessment / Plan / Recommendation Clinical Impression  Pt does not like to eat much at a time, saying that she gets full easily. She prefers the softer, chopped diet that she was placed on yesterday by RD. She ate bites of puree and thin liquids from her breakfast tray with no overt signs of difficulty or aspiration. She takes small bites/sips and uses a slow rate with Mod I. Would continue with current diet. SLP will f/u briefly given reported subjective symptoms, although continue to suspect a primary esophageal component. Education was provided about esophageal precautions.   HPI HPI: 85 yo female with onset of hypoxia, hypercapnia and acute respiratory failure was admitted, may have been a medication reaction.  Cleared for PE, Covid (-).  PMHx:  thyroid disease, COPD, HTN, atherosclerosis carotids, bronchitis, underwent cath on 9/29 and developed post procedure hypotension found to have pericardial effusion with concern for tampanade due to coronary bleed.  Underwent emergent sternotomy, and evacuation of pericardial effusion.  Extubated 10/1. SLP ordered due to pt report of regurgitation.      SLP Plan  Continue with current plan of care       Recommendations  Diet recommendations: Dysphagia 2 (fine chop);Thin liquid Liquids provided via: Cup;Straw Medication Administration: Whole meds with liquid Supervision: Patient able to self feed;Intermittent supervision to cue for compensatory strategies Compensations: Slow rate;Small sips/bites Postural Changes and/or Swallow Maneuvers: Seated upright 90 degrees;Upright 30-60 min after meal                Oral Care Recommendations: Oral care BID Follow up Recommendations: None SLP Visit Diagnosis: Dysphagia, unspecified  (R13.10) Plan: Continue with current plan of care       GO                Amy Chambers Amy Chambers 05/29/2019, 9:26 AM  Amy Chambers, M.A. [place] Acute Environmental education officer [phone] Office [phone]

## 2019-05-29 NOTE — Care Management Important Message (Signed)
Important Message  Patient Details  Name: Amy Chambers MRN: [id number] Date of Birth: Jul 31, 1928   Medicare Important Message Given:  Yes     Shelda Altes 05/29/2019, 2:27 PM

## 2019-05-29 NOTE — Progress Notes (Signed)
Physical Therapy Treatment Patient Details Name: Amy Chambers MRN: [id number] DOB: 07-Sep-1927 Today's Date: 05/29/2019    History of Present Illness 85 yo female with onset of hypoxia, hypercapnia and acute respiratory failure was admitted, may have been a medication reaction.  Cleared for PE, Covid (-).  PMHx:  thyroid disease, COPD, HTN, atherosclerosis carotids, bronchitis, underwent cath on 9/29 and developed post procedure hypotension found to have pericardial effusion with concern for tampanade due to coronary bleed.  Underwent emergent sternotomy, and evacuation of pericardial effusion.  Extubated 10/1.    PT Comments    Pt with slow progression towards goals. Pt had ambulated with cardiac rehab, so only agreeable to transfer to [facility] and for LE HEP. Required min A to perform transfers and required cues to maintain precautions. Educated about frequency to perform HEP. Pt and pt's daughter reports plan is now to go to SNF at d/c as pt feels she cannot tolerate 3 hrs of therapy a day. Updated recommendations. Will continue to follow acutely to maximize functional mobility independence and safety.    Follow Up Recommendations  SNF;Supervision for mobility/OOB     Equipment Recommendations  Other (comment)(rollator)    Recommendations for Other Services       Precautions / Restrictions Precautions Precautions: Fall;Sternal Precaution Booklet Issued: No Restrictions Weight Bearing Restrictions: No    Mobility  Bed Mobility               General bed mobility comments: Pt up in recliner upon entry.   Transfers Overall transfer level: Needs assistance Equipment used: 1 person hand held assist Transfers: Sit to/from [employer] Sit to Stand: Min assist Stand pivot transfers: Min assist       General transfer comment: Min A for lift assist and steadying to stand. Cues for hand placement to maintain precautions. Min A to transfer from recliner to/from [facility].  Pt had ambulated with cardiac rehab, so refusing further mobility.   Ambulation/Gait                 Stairs             Wheelchair Mobility    Modified Rankin (Stroke Patients Only)       Balance Overall balance assessment: Needs assistance Sitting-balance support: No upper extremity supported;Feet supported Sitting balance-Leahy Scale: Good     Standing balance support: Single extremity supported;During functional activity Standing balance-Leahy Scale: Poor Standing balance comment: Reliant on UE support and external support                             Cognition Arousal/Alertness: Awake/alert Behavior During Therapy: WFL for tasks assessed/performed Overall Cognitive Status: Impaired/Different from baseline Area of Impairment: Memory                     Memory: Decreased recall of precautions         General Comments: Required cues to maintain precautions during functional mobility       Exercises General Exercises - Lower Extremity Ankle Circles/Pumps: AROM;Both;20 reps Quad Sets: AROM;Both;10 reps Long Arc Quad: AROM;Both;10 reps;Seated Heel Slides: AROM;Both;10 reps Hip Flexion/Marching: AROM;Both;10 reps;Seated    General Comments        Pertinent Vitals/Pain Pain Assessment: Faces Faces Pain Scale: No hurt    Home Living                      Prior Function  PT Goals (current goals can now be found in the care plan section) Acute Rehab PT Goals Patient Stated Goal: to go to rehab to get stronger PT Goal Formulation: With patient/family Time For Goal Achievement: 06/13/19 Potential to Achieve Goals: Good Progress towards PT goals: Progressing toward goals    Frequency    Min 2X/week      PT Plan Discharge plan needs to be updated;Frequency needs to be updated    Co-evaluation              AM-PAC PT "6 Clicks" Mobility   Outcome Measure  Help needed turning from your back  to your side while in a flat bed without using bedrails?: A Little Help needed moving from lying on your back to sitting on the side of a flat bed without using bedrails?: A Little Help needed moving to and from a bed to a chair (including a wheelchair)?: A Little Help needed standing up from a chair using your arms (e.g., wheelchair or bedside chair)?: A Little Help needed to walk in hospital room?: A Little Help needed climbing 3-5 steps with a railing? : A Lot 6 Click Score: 17    End of Session   Activity Tolerance: Patient limited by fatigue Patient left: with call bell/phone within reach;with family/visitor present;in chair Nurse Communication: Mobility status PT Visit Diagnosis: Other abnormalities of gait and mobility (R26.89);Muscle weakness (generalized) (M62.81)     Time: 1165-7903 PT Time Calculation (min) (ACUTE ONLY): 16 min  Charges:  $Therapeutic Activity: 8-22 mins                     Leighton Ruff, PT, DPT  Acute Rehabilitation Services  Pager: [phone] Office: [phone]    Rudean Hitt 05/29/2019, 6:14 PM

## 2019-05-29 NOTE — Progress Notes (Addendum)
Progress Note  Patient Name: Amy Chambers Date of Encounter: 05/29/2019  Primary Cardiologist: Reatha HarpsWesley T O'Neal, MD   Subjective   Pt has been bradycardic, her amiodarone and BB have been held Now HR to 62.  No chest pain, feeling better, has not walked in hall yet.   Inpatient Medications    Scheduled Meds: . amiodarone  200 mg Oral Daily  . apixaban  2.5 mg Oral BID  . atorvastatin  40 mg Oral q1800  . clopidogrel  75 mg Oral Daily  . feeding supplement (ENSURE ENLIVE)  237 mL Oral BID BM  . feeding supplement (NEPRO CARB STEADY)  237 mL Oral TID BM  . furosemide  20 mg Oral Daily  . guaiFENesin  600 mg Oral BID  . levothyroxine  75 mcg Oral Daily  . loratadine  10 mg Oral Daily  . mouth rinse  15 mL Mouth Rinse BID  . metoprolol succinate  12.5 mg Oral Daily  . mometasone-formoterol  2 puff Inhalation BID  . multivitamin with minerals  1 tablet Oral Daily  . pantoprazole  40 mg Oral BID  . polyethylene glycol  17 g Oral Daily  . sodium chloride flush  3 mL Intravenous Q12H   Continuous Infusions: . sodium chloride Stopped (05/17/19 2000)   PRN Meds: sodium chloride, acetaminophen, albuterol, diphenhydrAMINE-zinc acetate, ondansetron **OR** ondansetron (ZOFRAN) IV, oxyCODONE, sodium chloride flush, traMADol, traZODone   Vital Signs    Vitals:   05/29/19 0355 05/29/19 0450 05/29/19 0750 05/29/19 1128  BP: (!) 120/49   (!) 102/35  Pulse: 60  60 61  Resp: 19  18 17   Temp: 98.1 F (36.7 C)   98.2 F (36.8 C)  TempSrc: Oral   Oral  SpO2: 94%  95% 95%  Weight:  56.7 kg    Height:        Intake/Output Summary (Last 24 hours) at 05/29/2019 1246 Last data filed at 05/29/2019 1100 Gross per 24 hour  Intake 718 ml  Output 1025 ml  Net -307 ml   Last 3 Weights 05/29/2019 05/28/2019 05/27/2019  Weight (lbs) 125 lb 127 lb 10.3 oz 131 lb 2.8 oz  Weight (kg) 56.7 kg 57.9 kg 59.5 kg      Telemetry    Sinus Brady to 38 - Personally Reviewed  ECG    No new -  Personally Reviewed  Physical Exam   GEN: No acute distress.   Neck: No JVD Cardiac: RRR, no murmurs, rubs, or gallops.  Respiratory: Clear to auscultation bilaterally. GI: Soft, nontender, non-distended  MS: No edema; No deformity. Neuro:  Nonfocal  Psych: Normal affect   Labs    High Sensitivity Troponin:   Recent Labs  Lab 05/10/19 1303 05/10/19 1527 05/10/19 1906 05/12/19 0653 05/12/19 0908  TROPONINIHS 44* 499* 975* 1,563* 1,288*      Chemistry Recent Labs  Lab 05/26/19 0803 05/27/19 0745 05/28/19 0402 05/29/19 0545  NA 137 136 136 133*  K 4.3 4.2 4.1 4.6  CL 97* 97* 97* 94*  CO2 30 30 27 30   GLUCOSE 113* 106* 81 80  BUN 15 18 19 20   CREATININE 0.80 0.80 0.85 0.91  CALCIUM 8.8* 8.4* 8.4* 8.4*  PROT 5.7* 5.1*  --  5.2*  ALBUMIN 2.9* 2.6*  --  2.7*  AST 38 29  --  26  ALT 115* 85*  --  61*  ALKPHOS 63 63  --  62  BILITOT 1.1 1.0  --  1.1  GFRNONAA >60 >60 >60 56*  GFRAA >60 >60 >60 >60  ANIONGAP 10 9 12 9      Hematology Recent Labs  Lab 05/26/19 0931 05/27/19 0745 05/28/19 0402  WBC 9.7 13.1* 9.9  RBC 3.54* 3.57* 3.61*  HGB 10.4* 10.8* 10.7*  HCT 34.2* 33.7* 34.5*  MCV 96.6 94.4 95.6  MCH 29.4 30.3 29.6  MCHC 30.4 32.0 31.0  RDW 17.8* 18.0* 18.5*  PLT 268 249 248    BNPNo results for input(s): BNP, PROBNP in the last 168 hours.   DDimer No results for input(s): DDIMER in the last 168 hours.   Radiology    No results found.  Cardiac Studies   Echo 05/16/19 IMPRESSIONS 1. Left ventricular ejection fraction, by visual estimation, is 55 to 60%. The left ventricle has normal function. Left ventricular septal wall thickness was mildly increased. There is mildly increased left ventricular hypertrophy. 2. Inferior basal hypokinesis but overal EF preserved. 3. Global right ventricle has normal systolic function.The right ventricular size is normal. No increase in right ventricular wall thickness. 4. Left atrial size was normal. 5.  Right atrial size was mildly dilated. 6. Moderate calcification of the mitral valve leaflet(s). 7. Moderate mitral annular calcification. 8. Moderate thickening of the mitral valve leaflet(s). 9. The mitral valve is normal in structure. No evidence of mitral valve regurgitation. No evidence of mitral stenosis. 10. The tricuspid valve is normal in structure. Tricuspid valve regurgitation is mild. 11. The aortic valve is tricuspid Aortic valve regurgitation was not visualized by color flow Doppler. Mild to moderate aortic valve sclerosis/calcification without any evidence of aortic stenosis. 12. The pulmonic valve was not well visualized. Pulmonic valve regurgitation was not assessed by color flow Doppler. 13. Post pericardial window with no tamponade or residual effuson. 14. The interatrial septum was not well visualized.  FINDINGS Left Ventricle: Left ventricular ejection fraction, by visual estimation, is 55 to 60%. The left ventricle has normal function. Left ventricular septal wall thickness was mildly increased. There is mildly increased left ventricular hypertrophy. Inferior basal hypokinesis but overal EF preserved.   Patient Profile     85 y.o. female from 89 with history of COPD, HTN, and GERD who was initially admitted with COPD exacerbation with hypoxic respiratory failure. It was unclear if there was perhaps a component of respiratory failure from allergic reaction to Augmentin. She was treated with steroids, antibiotics, and supplemental O2. As part of her workup she was also found to have NSTEMI with hsTroponin peak of 1563 and 2D echo with suspected ischemic cardiomyopathy with EF 45-50% and mild hypokinesis of the basal to mid inferoseptum/inferior wall concerning for RCA ischemia/infarction.   She underwent cath 05/14/19 with PTCA/DES to proximal RCA stenosis. The intervention was complicated by perforation with tamponade and hemopericardium.   She was taken to the  OR 9/29 for mediastinal pericardial washout. Post-op course was complicated by postoperative paroxysmal atrial fibrillation with intermittent post-conversion pauses up to 3.7 seconds. She was seen by EP, loaded with amiodarone, and metoprolol was reduced. She was not felt to require PPM.   Assessment & Plan    1. NSTEMI/CAD s/p PCI of the proximal RCA-  - continue Plavix, no ASA 2nd Eliquis - BB held due to low BP, restarted at decreased dose - BP improved, but HR low   2. Acute combined CHF-  - EF previously decreased, normal by echo 10/01 - CHF improved, now on po Lasix  - I/O net neg 2414 since admit - wt 52.7  kg>>64.4 kg>>56.7 kg today - 10/09 CXR w/ small layering effusions, emphysema - cardiopericardial silhouette enlarged, similar to prior CT  3. AECOPD with acute hypoxic respiratory failure -  - s/p 5 days steroids and ABX - on Dulera & prn albuterol (per CCM recs) -She was not on home O2 prior to admission -Need to see how she does off O2, O2 sats dropped to 91% yesterday when working with PT  4. Paroxymal atrial fibrillation with SSS - has had on/off since surgery - post-termination pauses up to 3.7 sec - seen by EP, amio loaded but decreased to 200 mg qd on 10/09 due to nausea - continue Eliquis 2.5 mg bid - now with bradycardia to 30s,  Have held amiodarone and BB today will defer to Dr. Claiborne Billings on medication  5. Acute blood loss anemia/hemopericardium 2/2 coronary perforation - - s/p surgery 09/30 w/ 250 cc blood evacuated - chest tube out 10/02 - H&H are stable -Last echo was 10/1 and showed no effusion, MD advise on when this should be repeated  6. Thrombocytopenia-  - felt 2nd surgery and acute illness - nadir 78, resolved  7. Deconditioning - - Plan is for SNF  Perhaps tomorrow.  8. Transaminitis:  -Mild elevation, improving - Ultrasound unremarkable - Likely related to the stress of acute illness -Since on amiodarone, continue to follow   9. Rash:resolved No complaints at this time  10.  Dysphagia -Speech has evaluated, it may be that her reflux symptoms have increased over the last few days and the dysphagia may be esophageal in origin. -SLP will continue to follow on dysphasia 2 diet     For questions or updates, please contact Circle Please consult [email or web address] for contact info under     Signed, Cecilie Kicks, NP  05/29/2019, 12:46 PM     Patient seen and examined. Agree with assessment and plan.Maintainig sinus rhythm now in the 60s. She had prolonged SNRT following conversion from AF. Continue low dose metoprolol,; will decrease amiodarone to 100 mg daily. To SNF tomorrow.    Troy Sine, MD, [facility] 05/29/2019 1:20 PM

## 2019-05-30 DIAGNOSIS — I495 Sick sinus syndrome: Secondary | ICD-10-CM | POA: Diagnosis not present

## 2019-05-30 LAB — URINALYSIS, ROUTINE W REFLEX MICROSCOPIC
Bilirubin Urine: NEGATIVE
Glucose, UA: NEGATIVE mg/dL
Ketones, ur: NEGATIVE mg/dL
Leukocytes,Ua: NEGATIVE
Nitrite: NEGATIVE
Protein, ur: NEGATIVE mg/dL
Specific Gravity, Urine: 1.012 (ref 1.005–1.030)
pH: 6 (ref 5.0–8.0)

## 2019-05-30 LAB — NOVEL CORONAVIRUS, NAA (HOSP ORDER, SEND-OUT TO REF LAB; TAT 18-24 HRS): SARS-CoV-2, NAA: NOT DETECTED

## 2019-05-30 MED ORDER — BISACODYL 10 MG RE SUPP: 10.0000 mg | Freq: Every day | RECTAL | Status: DC | PRN

## 2019-05-30 MED ORDER — AMIODARONE HCL 100 MG PO TABS
100.0000 mg | ORAL_TABLET | Freq: Every day | ORAL | Status: DC
  Administered 2019-05-31: 09:00:00 100 mg via ORAL
  Filled 2019-05-30: qty 1

## 2019-05-30 NOTE — Progress Notes (Addendum)
Progress Note  Patient Name: Amy Chambers Date of Encounter: 05/30/2019  Primary Cardiologist: Reatha Harps, MD   Subjective   Now hallucinating -talking to people not present. This started last night.  Inpatient Medications    Scheduled Meds:  amiodarone  100 mg Oral Daily   apixaban  2.5 mg Oral BID   atorvastatin  40 mg Oral q1800   clopidogrel  75 mg Oral Daily   feeding supplement (ENSURE ENLIVE)  237 mL Oral BID BM   feeding supplement (NEPRO CARB STEADY)  237 mL Oral TID BM   furosemide  20 mg Oral Daily   guaiFENesin  600 mg Oral BID   levothyroxine  75 mcg Oral Daily   loratadine  10 mg Oral Daily   mouth rinse  15 mL Mouth Rinse BID   metoprolol succinate  12.5 mg Oral Daily   mometasone-formoterol  2 puff Inhalation BID   multivitamin with minerals  1 tablet Oral Daily   pantoprazole  40 mg Oral BID   polyethylene glycol  17 g Oral Daily   sodium chloride flush  3 mL Intravenous Q12H   Continuous Infusions:  sodium chloride Stopped (05/17/19 2000)   PRN Meds: sodium chloride, acetaminophen, albuterol, diphenhydrAMINE-zinc acetate, ondansetron **OR** ondansetron (ZOFRAN) IV, oxyCODONE, sodium chloride flush, traMADol, traZODone   Vital Signs    Vitals:   05/30/19 0622 05/30/19 0800 05/30/19 1049 05/30/19 1200  BP:  114/68 118/65 (!) 105/47  Pulse:  61 62 64  Resp:  16 15 18   Temp:  98.4 F (36.9 C)  97.7 F (36.5 C)  TempSrc:  Oral  Oral  SpO2:  99% 95% 100%  Weight: 51.8 kg     Height:        Intake/Output Summary (Last 24 hours) at 05/30/2019 1305 Last data filed at 05/30/2019 0347 Gross per 24 hour  Intake 240 ml  Output 1400 ml  Net -1160 ml   Last 3 Weights 05/30/2019 05/29/2019 05/28/2019  Weight (lbs) 114 lb 1.6 oz 125 lb 127 lb 10.3 oz  Weight (kg) 51.755 kg 56.7 kg 57.9 kg      Telemetry    SR then a fib/flutter then conversion pauses - Personally Reviewed  ECG    No new - Personally  Reviewed  Physical Exam   GEN: No acute distress.   Neck: No JVD Cardiac: RRR, no murmurs, rubs, or gallops.  Respiratory: Clear to auscultation bilaterally. GI: Soft, nontender, non-distended  MS: No edema; No deformity. Neuro:  Nonfocal A&O X 3, MAE, follows commands, equal grips and leg strength.  Psych: Normal affect   Labs    High Sensitivity Troponin:   Recent Labs  Lab 05/10/19 1303 05/10/19 1527 05/10/19 1906 05/12/19 0653 05/12/19 0908  TROPONINIHS 44* 499* 975* 1,563* 1,288*      Chemistry Recent Labs  Lab 05/26/19 0803 05/27/19 0745 05/28/19 0402 05/29/19 0545  NA 137 136 136 133*  K 4.3 4.2 4.1 4.6  CL 97* 97* 97* 94*  CO2 30 30 27 30   GLUCOSE 113* 106* 81 80  BUN 15 18 19 20   CREATININE 0.80 0.80 0.85 0.91  CALCIUM 8.8* 8.4* 8.4* 8.4*  PROT 5.7* 5.1*  --  5.2*  ALBUMIN 2.9* 2.6*  --  2.7*  AST 38 29  --  26  ALT 115* 85*  --  61*  ALKPHOS 63 63  --  62  BILITOT 1.1 1.0  --  1.1  GFRNONAA >60 >60 >60  56*  GFRAA >60 >60 >60 >60  ANIONGAP 10 9 12 9      Hematology Recent Labs  Lab 05/26/19 0931 05/27/19 0745 05/28/19 0402  WBC 9.7 13.1* 9.9  RBC 3.54* 3.57* 3.61*  HGB 10.4* 10.8* 10.7*  HCT 34.2* 33.7* 34.5*  MCV 96.6 94.4 95.6  MCH 29.4 30.3 29.6  MCHC 30.4 32.0 31.0  RDW 17.8* 18.0* 18.5*  PLT 268 249 248    BNPNo results for input(s): BNP, PROBNP in the last 168 hours.   DDimer No results for input(s): DDIMER in the last 168 hours.   Radiology    No results found.  Cardiac Studies   Echo 05/16/19 IMPRESSIONS 1. Left ventricular ejection fraction, by visual estimation, is 55 to 60%. The left ventricle has normal function. Left ventricular septal wall thickness was mildly increased. There is mildly increased left ventricular hypertrophy. 2. Inferior basal hypokinesis but overal EF preserved. 3. Global right ventricle has normal systolic function.The right ventricular size is normal. No increase in right ventricular wall  thickness. 4. Left atrial size was normal. 5. Right atrial size was mildly dilated. 6. Moderate calcification of the mitral valve leaflet(s). 7. Moderate mitral annular calcification. 8. Moderate thickening of the mitral valve leaflet(s). 9. The mitral valve is normal in structure. No evidence of mitral valve regurgitation. No evidence of mitral stenosis. 10. The tricuspid valve is normal in structure. Tricuspid valve regurgitation is mild. 11. The aortic valve is tricuspid Aortic valve regurgitation was not visualized by color flow Doppler. Mild to moderate aortic valve sclerosis/calcification without any evidence of aortic stenosis. 12. The pulmonic valve was not well visualized. Pulmonic valve regurgitation was not assessed by color flow Doppler. 13. Post pericardial window with no tamponade or residual effuson. 14. The interatrial septum was not well visualized.  FINDINGS Left Ventricle: Left ventricular ejection fraction, by visual estimation, is 55 to 60%. The left ventricle has normal function. Left ventricular septal wall thickness was mildly increased. There is mildly increased left ventricular hypertrophy. Inferior basal hypokinesis but overal EF preserved.  Patient Profile     85 [place] with history of COPD, HTN, and GERD who was initially admitted with COPD exacerbation with hypoxic respiratory failure. It was unclear if there was perhaps a component of respiratory failure from allergic reaction to Augmentin. She was treated with steroids, antibiotics, and supplemental O2. As part of her workup she was also found to have NSTEMI with hsTroponin peak of 1563 and 2D echo with suspected ischemic cardiomyopathy with EF 45-50% and mild hypokinesis of the basal to mid inferoseptum/inferior wall concerning for RCA ischemia/infarction.   She underwent cath 05/14/19 with PTCA/DES to proximal RCA stenosis. The intervention was complicated by perforation with tamponade  and hemopericardium.   She was taken to the OR 9/29 for mediastinal pericardial washout. Post-op course was complicated by postoperative paroxysmal atrial fibrillation with intermittent post-conversion pauses up to 3.7 seconds. She was seen by EP, loaded with amiodarone, and metoprolol was reduced. She was not felt to require PPM.  Assessment & Plan    1. NSTEMI/CAD s/p PCI of the proximal RCA- - continue Plavix, no ASA 2nd Eliquis - BB held due to low BP, restarted at decreased dose - BP improved, but HR low - changed meds yesterday   2. Acute combined CHF- - EF previously decreased, normal by echo 10/01 - CHF improved, now on po Lasix - I/O net neg 2507 since admit - wt 52.7 kg>>64.4 kg>>56.7 kg -> 51.8  Kg today - 10/09 CXR w/ small layering effusions, emphysema - cardiopericardial silhouette enlarged, similar to prior CT  3. AECOPD with acute hypoxic respiratory failure - - s/p 5 days steroids and ABX - on Dulera & prn albuterol (per CCM recs) -She was not on home O2 prior to admission -Need to see how she does off O2, O2 sats dropped to 91% yesterday when working with PT  4. Paroxymal atrial fibrillation with SSS -has had on/off since surgery - post-termination pauses up to 3.7 sec - seen by EP, amio loaded but decreased to 200 mg qd on 10/09 due to nausea now alt 100 mg /200 mg - continue Eliquis 2.5 mg bid - now with bradycardia to 30s, decreased amio yesterday and now with break through a fib with HR 100-105   5. Acute blood loss anemia/hemopericardium 2/2 coronary perforation - - s/p surgery 09/30 w/ 250 cc blood evacuated - chest tube out 10/02 - H&H are stable -Last echo was 10/1 and showed no effusion, MD advise on when this should be repeated  6. Thrombocytopenia- - felt 2nd surgery and acute illness - nadir 78, resolved  7. Deconditioning - - Plan is for SNF  Perhaps Friday  8. Transaminitis: -Mild elevation, improving -Ultrasound  unremarkable -Likely related to the stress of acute illness -Since on amiodarone, continue to follow  9. Rash:resolved No complaints at this time  10. Dysphagia -Speech has evaluated, it may be that her reflux symptoms have increased over the last few days and the dysphagia may be esophageal in origin. -SLP will continue to follow on dysphasia 2 diet  11. Bradycardia with junct rhythm, after conversion pause, on amio 100 mg dail and torpol XL 12.5 mg daily.  Will stop BB and alt amio 100/200 QOD.   12. Confusion - possible sundowning, ? Neuro consult?    13. Constipation will give suppository         For questions or updates, please contact [place] Please consult [email or web address] for contact info under      Signed, Cecilie Kicks, NP  05/30/2019, 1:05 PM    Patient seen and examined. Agree with assessment and plan.  No recurrent chest pain.  Yesterday, due to bradycardia, her amiodarone dose was reduced to 100 mg daily and she was placed on metoprolol succinate 12.5 mg daily.  Today she apparently had another episode of atrial fibrillation followed by prollonged  recovery time following conversion with transient junctional rhythm heart rate into the 30s and 40s.  She is now in sinus rhythm in the 60s to 70s.  Suspect underlying sick sinus syndrome.  An attempt to prevent recurrent PAF will slightly titrate amiodarone to 100 mg alternating with 200 mg.  She is not sleeping well.  Today her daughter concerned that her mother at times was stating that she was hearing music when none was playing and the TV was off in the room.  There are no focal neurologic findings.  She continues to be on anticoagulation with Eliquis and antiplatelet therapy with Plavix with a recently placed RCA stents.  Would defer transfer to SNF today and hopefully possibly she can be sent tomorrow.   Troy Sine, MD, [facility] 05/30/2019 2:08 PM

## 2019-05-30 NOTE — Progress Notes (Signed)
PROGRESS NOTE    Amy Chambers M [place]  VHQ:[id number] DOB: [birth year] DOA: 05/10/2019 PCP: [name], [name], [name]  Brief Narrative: 85 year old with past medical history significant for hypertension, hypothyroidism, COPD and GERD who initially presented for acute onset of shortness of breath with hypoxia.  Found to have non-STEMI for which left heart cath revealed proximal RCA stenosis.  Intervention was complicated by perforation with subsequent tamponade and hemopericardium.  Cardiothoracic surgery was consulted and patient underwent pericardial washout on 9/29.  Postoperative noted to have A. fib with pulses and electrophysiology was consulted.  Currently patient noted to have poor oral intake and failure to thrive. Currently evaluated for inpatient rehab. PT advised SNF Lives alone  Subjective: Resting comfortably alert awake, oriented on placement.  No new complaints.  Assessment & Plan: NSTEMI s/p PCI of proximal RCA: Stable no chest pain, continue Plavix and Eliquis, no aspirin, per cardiology  Acute combined heart failure on Lasix.  Currently euvolemic.  Paroxysmal A. fib with sick sinus syndrome on amiodarone.  Continue blood cardiology, Eliquis 2.5 mg twice daily and bradycardia and amiodarone was held briefly, Dose decreased to 100 mg daily. Continue low-dose metoprolol per cardiology.  Acute exacerbation of COPD and acute hypoxic respiratory failure: Patient completed steroid and antibiotic. continue Dulera,Albuterol.  Currently on room air and saturating well.    Bblood loss anemia with hemopericardium secondary to coronary perforation surgery 05/15/2019 with 250 cc of blood evacuated follow-up echo 05/16/2019 showed no effusion per cardiology.  Mild elevated transaminases improving.  Ultrasound no biliary sludge.  Hypothyroidism continue current dose of Synthroid.  Nausea unclear etiology PPI dose increased to twice daily 05/28/2019 follow-up.   Nutrition Problem: Inadequate  oral intake Etiology: inability to eat  Estimated body mass index is 21.56 kg/m as calculated from the following:   Height as of this encounter: 5\' 1"  (1.549 m).   Weight as of this encounter: 51.8 kg.  DVT prophylaxis: Eliquis.  Code Status: DO NOT RESUSCITATE  family Communication:none at bedside Disposition plan: awaiting SNF placement Covid pending.  Continue as per cardiology.  Hospitalist will continue to follow while patient is in the hospital.    Objective: Vitals:   05/29/19 2126 05/30/19 0105 05/30/19 0347 05/30/19 0622  BP:  107/65 (!) 102/44   Pulse:  66 (!) 58   Resp:  20 19   Temp:  98.2 F (36.8 C) 98.5 F (36.9 C)   TempSrc:  Oral Oral   SpO2: 93% 95% 96%   Weight:    51.8 kg  Height:        Intake/Output Summary (Last 24 hours) at 05/30/2019 0830 Last data filed at 05/30/2019 0347 Gross per 24 hour  Intake 480 ml  Output 1500 ml  Net -1020 ml   Filed Weights   05/28/19 0513 05/29/19 0450 05/30/19 0622  Weight: 57.9 kg 56.7 kg 51.8 kg    Examination: General exam: Calm, comfortable, on room air, elderly, frail.  HEENT:Oral mucosa moist, Ear/Nose WNL grossly, dentition normal. Respiratory system: Bilateral equal air entry, no crackles and wheezing, no use of accessory muscle, non tender on palpation. Cardiovascular system: regular rate and rhythm, S1 & S2 heard, No JVD/murmurs. Gastrointestinal system: Abdomen soft, non-tender, non-distended, BS +. No hepatosplenomegaly palpable. Nervous System:Alert, awake and oriented at baseline. Able to move UE and LE, sensation intact. Extremities: No edema, distal peripheral pulses palpable.  Skin: No rashes,no icterus. MSK: Normal muscle bulk,tone, power external.  Sternal Incision stable.  Data Reviewed: I have personally reviewed following  labs and imaging studies  CBC: Recent Labs  Lab 05/24/19 1117 05/25/19 0439 05/26/19 0931 05/27/19 0745 05/28/19 0402  WBC 12.1* 11.6* 9.7 13.1* 9.9  HGB 10.9*  9.6* 10.4* 10.8* 10.7*  HCT 34.0* 31.3* 34.2* 33.7* 34.5*  MCV 95.5 95.4 96.6 94.4 95.6  PLT 268 230 268 249 248   Basic Metabolic Panel: Recent Labs  Lab 05/25/19 0439 05/26/19 0803 05/27/19 0745 05/28/19 0402 05/29/19 0545  NA 138 137 136 136 133*  K 3.4* 4.3 4.2 4.1 4.6  CL 96* 97* 97* 97* 94*  CO2 31 30 30 27 30   GLUCOSE 86 113* 106* 81 80  BUN 17 15 18 19 20   CREATININE 0.87 0.80 0.80 0.85 0.91  CALCIUM 8.1* 8.8* 8.4* 8.4* 8.4*   GFR: Estimated Creatinine Clearance: 31.6 mL/min (by C-G formula based on SCr of 0.91 mg/dL). Liver Function Tests: Recent Labs  Lab 05/24/19 1117 05/25/19 0439 05/26/19 0803 05/27/19 0745 05/29/19 0545  AST 52* 46* 38 29 26  ALT 155* 122* 115* 85* 61*  ALKPHOS 64 55 63 63 62  BILITOT 1.2 0.9 1.1 1.0 1.1  PROT 5.0* 4.7* 5.7* 5.1* 5.2*  ALBUMIN 2.5* 2.4* 2.9* 2.6* 2.7*   No results for input(s): LIPASE, AMYLASE in the last 168 hours. No results for input(s): AMMONIA in the last 168 hours. Coagulation Profile: No results for input(s): INR, PROTIME in the last 168 hours. Cardiac Enzymes: No results for input(s): CKTOTAL, CKMB, CKMBINDEX, TROPONINI in the last 168 hours. BNP (last 3 results) No results for input(s): PROBNP in the last 8760 hours. HbA1C: No results for input(s): HGBA1C in the last 72 hours. CBG: Recent Labs  Lab 05/24/19 1658  GLUCAP 110*   Lipid Profile: No results for input(s): CHOL, HDL, LDLCALC, TRIG, CHOLHDL, LDLDIRECT in the last 72 hours. Thyroid Function Tests: Recent Labs    05/28/19 0402  FREET4 1.21*  T3FREE 1.1*   Anemia Panel: No results for input(s): VITAMINB12, FOLATE, FERRITIN, TIBC, IRON, RETICCTPCT in the last 72 hours. Sepsis Labs: No results for input(s): PROCALCITON, LATICACIDVEN in the last 168 hours.  Recent Results (from the past 240 hour(s))  Culture, Urine     Status: Abnormal   Collection Time: 05/24/19  4:11 PM   Specimen: Urine, Clean Catch  Result Value Ref Range Status    Specimen Description URINE, CLEAN CATCH  Final   Special Requests   Final    NONE Performed at [facility], [address], [place], [address] [place]    Culture MULTIPLE SPECIES PRESENT, SUGGEST RECOLLECTION (A)  Final   Report Status 05/26/2019 FINAL  Final         Radiology Studies: No results found.      Scheduled Meds: . amiodarone  100 mg Oral Daily  . apixaban  2.5 mg Oral BID  . atorvastatin  40 mg Oral q1800  . clopidogrel  75 mg Oral Daily  . feeding supplement (ENSURE ENLIVE)  237 mL Oral BID BM  . feeding supplement (NEPRO CARB STEADY)  237 mL Oral TID BM  . furosemide  20 mg Oral Daily  . guaiFENesin  600 mg Oral BID  . levothyroxine  75 mcg Oral Daily  . loratadine  10 mg Oral Daily  . mouth rinse  15 mL Mouth Rinse BID  . metoprolol succinate  12.5 mg Oral Daily  . mometasone-formoterol  2 puff Inhalation BID  . multivitamin with minerals  1 tablet Oral Daily  . pantoprazole  40 mg Oral BID  . polyethylene glycol  17 g Oral Daily  . sodium chloride flush  3 mL Intravenous Q12H   Continuous Infusions: . sodium chloride Stopped (05/17/19 2000)     LOS: 20 days     Antonieta Pert, MD [facility]  If 7PM-7AM, please contact night-coverage [email or web address] Password [email or web address] 05/30/2019, 8:30 AM

## 2019-05-30 NOTE — Progress Notes (Signed)
Tele reported that pt had junctional rhythm with a rate of 35. Pt was asymptomatic and heart rated occasionally dropped drop down to 50's. Vital Stable. Currently heart rate sustains at 60's. Notified MD

## 2019-05-30 NOTE — Progress Notes (Signed)
CARDIAC REHAB PHASE I   PRE:  Rate/Rhythm: 69 SR    BP: sitting 146/69    SaO2: 96 RA  MODE:  Ambulation: 100 ft   POST:  Rate/Rhythm: 107 afib    BP: sitting 165/105, retake 176/103     SaO2: 93 RA  Pt took pills then ambulated. Needs assist as she is unsteady, esp without RW. Core weak. Used RW and assist x2 with gait belt. Rest x1 after 50 ft for DOE and fatigue. Able to walk back but got weak at bed and tried to sit too early getting to recliner. Noted pt now in Afib, low 100s. BP elevated. Encouraged pt to continue to work hard at [facility] and remember sternal precautions.  [id number]   [place], ACSM 05/30/2019 10:33 AM

## 2019-05-30 NOTE — TOC Progression Note (Addendum)
Transition of Care [facility]) - Progression Note    Patient Details  Name: Amy Chambers MRN: [id number] Date of Birth: Aug 31, 1927  Transition of Care [facility]) CM/SW [place], [place] Phone Number: 05/30/2019, 1:52 PM  Clinical Narrative:    1:59pm- Reached Cecilie Kicks, NP through secure chat. Dr. Claiborne Billings is rounding MD this week, pt not medically stable for discharge will follow for dc tomorrow.  1:52pm- Patient has insurance authorization, pending COVID Test results at this time in order to discharge to [facility]. Unable to ascertain at this time who is attending MD to complete DNR and discharge summary when COVID test is completed.    Expected Discharge Plan: Skilled Nursing Facility Barriers to Discharge: Insurance Authorization  Expected Discharge Plan and Services Expected Discharge Plan: [place] In-house Referral: Clinical Social Work Discharge Planning Services: NA Post Acute Care Choice: [place] Living arrangements for the past 2 months: Single Family Home          DME Arranged: N/A HH Arranged: NA HH Agency: NA Date [address]: 05/14/19 Time [place]: 1301 Representative spoke with at [place]: [place] ([place]) Interventions    Readmission Risk Interventions Readmission Risk Prevention Plan 05/14/2019  Transportation Screening Complete  Home Care Screening Complete  Medication Review (RN CM) Complete  Some recent data might be hidden

## 2019-05-30 NOTE — Progress Notes (Signed)
Daughter told me pt jerking movement after she used BSC to urinate. There's a tissue with pinky blood. Stated that pt also threw up some food she ate earlier. Made MD aware.

## 2019-05-31 ENCOUNTER — Inpatient Hospital Stay
Admission: RE | Admit: 2019-05-31 | Discharge: 2019-06-14 | Disposition: A | Discharge status: Home / Self Care | Payer: Medicare Other | Source: Ambulatory Visit | Attending: Internal Medicine | Admitting: Internal Medicine

## 2019-05-31 ENCOUNTER — Encounter (HOSPITAL_COMMUNITY): Payer: Self-pay | Admitting: Cardiology

## 2019-05-31 DIAGNOSIS — Z23 Encounter for immunization: Secondary | ICD-10-CM | POA: Diagnosis not present

## 2019-05-31 DIAGNOSIS — J441 Chronic obstructive pulmonary disease with (acute) exacerbation: Secondary | ICD-10-CM | POA: Diagnosis not present

## 2019-05-31 DIAGNOSIS — I959 Hypotension, unspecified: Secondary | ICD-10-CM | POA: Diagnosis not present

## 2019-05-31 DIAGNOSIS — I480 Paroxysmal atrial fibrillation: Secondary | ICD-10-CM | POA: Diagnosis not present

## 2019-05-31 DIAGNOSIS — M6281 Muscle weakness (generalized): Secondary | ICD-10-CM | POA: Diagnosis not present

## 2019-05-31 DIAGNOSIS — Y840 Cardiac catheterization as the cause of abnormal reaction of the patient, or of later complication, without mention of misadventure at the time of the procedure: Secondary | ICD-10-CM | POA: Diagnosis not present

## 2019-05-31 DIAGNOSIS — D649 Anemia, unspecified: Secondary | ICD-10-CM | POA: Diagnosis not present

## 2019-05-31 DIAGNOSIS — E039 Hypothyroidism, unspecified: Secondary | ICD-10-CM | POA: Diagnosis not present

## 2019-05-31 DIAGNOSIS — D62 Acute posthemorrhagic anemia: Secondary | ICD-10-CM | POA: Diagnosis not present

## 2019-05-31 DIAGNOSIS — I214 Non-ST elevation (NSTEMI) myocardial infarction: Secondary | ICD-10-CM | POA: Diagnosis not present

## 2019-05-31 DIAGNOSIS — I5041 Acute combined systolic (congestive) and diastolic (congestive) heart failure: Secondary | ICD-10-CM | POA: Diagnosis not present

## 2019-05-31 DIAGNOSIS — R262 Difficulty in walking, not elsewhere classified: Secondary | ICD-10-CM | POA: Diagnosis not present

## 2019-05-31 DIAGNOSIS — I5042 Chronic combined systolic (congestive) and diastolic (congestive) heart failure: Secondary | ICD-10-CM | POA: Diagnosis not present

## 2019-05-31 DIAGNOSIS — R0602 Shortness of breath: Secondary | ICD-10-CM | POA: Diagnosis not present

## 2019-05-31 DIAGNOSIS — Z741 Need for assistance with personal care: Secondary | ICD-10-CM | POA: Diagnosis not present

## 2019-05-31 DIAGNOSIS — J449 Chronic obstructive pulmonary disease, unspecified: Secondary | ICD-10-CM | POA: Diagnosis not present

## 2019-05-31 DIAGNOSIS — I495 Sick sinus syndrome: Secondary | ICD-10-CM | POA: Diagnosis not present

## 2019-05-31 DIAGNOSIS — J410 Simple chronic bronchitis: Secondary | ICD-10-CM | POA: Diagnosis not present

## 2019-05-31 DIAGNOSIS — J9601 Acute respiratory failure with hypoxia: Secondary | ICD-10-CM | POA: Diagnosis not present

## 2019-05-31 DIAGNOSIS — J9602 Acute respiratory failure with hypercapnia: Secondary | ICD-10-CM | POA: Diagnosis not present

## 2019-05-31 DIAGNOSIS — Z7401 Bed confinement status: Secondary | ICD-10-CM | POA: Diagnosis not present

## 2019-05-31 DIAGNOSIS — I314 Cardiac tamponade: Secondary | ICD-10-CM | POA: Diagnosis not present

## 2019-05-31 DIAGNOSIS — I2510 Atherosclerotic heart disease of native coronary artery without angina pectoris: Secondary | ICD-10-CM | POA: Diagnosis not present

## 2019-05-31 DIAGNOSIS — J9600 Acute respiratory failure, unspecified whether with hypoxia or hypercapnia: Secondary | ICD-10-CM | POA: Diagnosis not present

## 2019-05-31 DIAGNOSIS — R5381 Other malaise: Secondary | ICD-10-CM | POA: Diagnosis not present

## 2019-05-31 DIAGNOSIS — R1310 Dysphagia, unspecified: Secondary | ICD-10-CM | POA: Diagnosis not present

## 2019-05-31 DIAGNOSIS — Z66 Do not resuscitate: Secondary | ICD-10-CM

## 2019-05-31 DIAGNOSIS — I230 Hemopericardium as current complication following acute myocardial infarction: Secondary | ICD-10-CM | POA: Diagnosis not present

## 2019-05-31 DIAGNOSIS — K219 Gastro-esophageal reflux disease without esophagitis: Secondary | ICD-10-CM | POA: Diagnosis not present

## 2019-05-31 DIAGNOSIS — M859 Disorder of bone density and structure, unspecified: Secondary | ICD-10-CM | POA: Diagnosis not present

## 2019-05-31 DIAGNOSIS — M2550 Pain in unspecified joint: Secondary | ICD-10-CM | POA: Diagnosis not present

## 2019-05-31 DIAGNOSIS — G44209 Tension-type headache, unspecified, not intractable: Secondary | ICD-10-CM | POA: Diagnosis not present

## 2019-05-31 DIAGNOSIS — Z7901 Long term (current) use of anticoagulants: Secondary | ICD-10-CM

## 2019-05-31 HISTORY — DX: Dysphagia, unspecified: R13.10

## 2019-05-31 LAB — TSH: TSH: 7.685 u[IU]/mL — ABNORMAL HIGH (ref 0.350–4.500)

## 2019-05-31 MED ORDER — MOMETASONE FURO-FORMOTEROL FUM 200-5 MCG/ACT IN AERO: 2.0000 | INHALATION_SPRAY | Freq: Two times a day (BID) | RESPIRATORY_TRACT | Status: DC

## 2019-05-31 MED ORDER — AMIODARONE HCL 200 MG PO TABS: 200.0000 mg | ORAL_TABLET | ORAL | Status: DC

## 2019-05-31 MED ORDER — ENSURE ENLIVE PO LIQD: 237.0000 mL | Freq: Two times a day (BID) | ORAL | 12 refills | Status: DC

## 2019-05-31 MED ORDER — ADULT MULTIVITAMIN W/MINERALS CH: 1.0000 | ORAL_TABLET | Freq: Every day | ORAL | Status: AC

## 2019-05-31 MED ORDER — FUROSEMIDE 20 MG PO TABS: 20.0000 mg | ORAL_TABLET | Freq: Every day | ORAL | Status: DC

## 2019-05-31 MED ORDER — AMIODARONE HCL 100 MG PO TABS: 100.0000 mg | ORAL_TABLET | ORAL | Status: DC

## 2019-05-31 MED ORDER — BISACODYL 10 MG RE SUPP: 10.0000 mg | Freq: Every day | RECTAL | 0 refills | Status: AC | PRN

## 2019-05-31 MED ORDER — ACETAMINOPHEN 325 MG PO TABS: 650.0000 mg | ORAL_TABLET | Freq: Four times a day (QID) | ORAL | Status: DC | PRN

## 2019-05-31 MED ORDER — DIPHENHYDRAMINE-ZINC ACETATE 2-0.1 % EX CREA: TOPICAL_CREAM | Freq: Two times a day (BID) | CUTANEOUS | 0 refills | Status: AC | PRN

## 2019-05-31 MED ORDER — ATORVASTATIN CALCIUM 40 MG PO TABS: 40.0000 mg | ORAL_TABLET | Freq: Every day | ORAL | Status: DC

## 2019-05-31 MED ORDER — GUAIFENESIN ER 600 MG PO TB12: 600.0000 mg | ORAL_TABLET | Freq: Two times a day (BID) | ORAL | Status: AC

## 2019-05-31 MED ORDER — AMIODARONE HCL 200 MG PO TABS
200.0000 mg | ORAL_TABLET | ORAL | Status: DC
  Administered 2019-05-31: 09:00:00 100 mg via ORAL
  Filled 2019-05-31: qty 1

## 2019-05-31 MED ORDER — LORATADINE 10 MG PO TABS: 10.0000 mg | ORAL_TABLET | Freq: Every day | ORAL | Status: DC

## 2019-05-31 MED ORDER — PANTOPRAZOLE SODIUM 40 MG PO TBEC: 40.0000 mg | DELAYED_RELEASE_TABLET | Freq: Two times a day (BID) | ORAL | Status: DC

## 2019-05-31 MED ORDER — CLOPIDOGREL BISULFATE 75 MG PO TABS: 75.0000 mg | ORAL_TABLET | Freq: Every day | ORAL | Status: DC

## 2019-05-31 MED ORDER — APIXABAN 2.5 MG PO TABS: 2.5000 mg | ORAL_TABLET | Freq: Two times a day (BID) | ORAL | Status: DC

## 2019-05-31 MED ORDER — NEPRO/CARBSTEADY PO LIQD: 237.0000 mL | Freq: Three times a day (TID) | ORAL | 0 refills | Status: DC

## 2019-05-31 NOTE — Progress Notes (Signed)
PROGRESS NOTE    Amy Chambers  WJX:[id number] DOB: [birth year] DOA: 05/10/2019 PCP: Bennie Pierini, [name]  Brief Narrative: 85 year old with past medical history significant for hypertension, hypothyroidism, COPD and GERD who initially presented for acute onset of shortness of breath with hypoxia.  Found to have non-STEMI for which left heart cath revealed proximal RCA stenosis.  Intervention was complicated by perforation with subsequent tamponade and hemopericardium.  Cardiothoracic surgery was consulted and patient underwent pericardial washout on 9/29.  Postoperative noted to have A. fib with pulses and electrophysiology was consulted.  Currently patient noted to have poor oral intake and failure to thrive. Currently evaluated for inpatient rehab. PT advised SNF Lives alone  Subjective: Sleeping, woke up on calling.  Nurses at the bedside.  Reports no acute events overnight.  Had some hallucination yesterday.  Assessment & Plan: NSTEMI s/p PCI of proximal RCA: Stable no chest pain, continue Plavix and Eliquis, no aspirin, per cardiology  Acute combined heart failure on Lasix.  Currently euvolemic.  Continue per cardiology.  Paroxysmal A. fib with sick sinus syndrome : Rate stable, continue Eliquis 2.5 mg twice daily amiodarone dose was decreased to 100 mg daily due to bradycardia. Continue low-dose metoprolol per cardiology.  Acute exacerbation of COPD and acute hypoxic respiratory failure: Patient completed steroid and antibiotic. continue Dulera,Albuterol.  Currently on room air and saturating well.    Blood loss anemia with hemopericardium secondary to coronary perforation surgery 05/15/2019 with 250 cc of blood evacuated follow-up echo 05/16/2019 showed no effusion per cardiology.  Mild elevated transaminases improving.  Ultrasound no biliary sludge.  Hypothyroidism continue current dose of Synthroid.  Nausea unclear etiology PPI dose increased to twice daily 05/28/2019 .  Doing  well overall.  Reported hallucination 10/15- UA obtained unremarkable.  Afebrile.  Appears alert awake and fairly stable.  She is elderly at 36, ? Baseline memory issues and related sundowning.  If persistent can consider neurology evaluation-defer to cardiology.  Abnormal thyroid function TSH elevated at 7.6 but Free T4 also up slightly at 1.21 and free T3 low at 1.1, ?amiodarone related.monitor, amiodarone dose per cardio.  Microscopic hematuria on UA WBC stable.  Monitor while on anticoagulation  Nutrition Problem: Inadequate oral intake Etiology: inability to eat  Estimated body mass index is 23.04 kg/m as calculated from the following:   Height as of this encounter: 5\' 1"  (1.549 m).   Weight as of this encounter: 55.3 kg.  DVT prophylaxis: Eliquis.  Code Status: DO NOT RESUSCITATE  Family Communication:none at bedside Disposition plan: awaiting SNF placement.  Negative Covid 19. Continue dispo plan as per cardiology. Hospitalist will continue to follow while patient is in the hospital.   Objective: Vitals:   05/31/19 0537 05/31/19 0543 05/31/19 0800 05/31/19 0914  BP:  (!) 120/53 (!) 153/59   Pulse:  (!) 58 62   Resp:  18 19   Temp:  98 F (36.7 C) 97.9 F (36.6 C)   TempSrc:  Oral Oral   SpO2:  97% 98% 93%  Weight: 55.3 kg     Height:        Intake/Output Summary (Last 24 hours) at 05/31/2019 0959 Last data filed at 05/31/2019 0851 Gross per 24 hour  Intake 920 ml  Output 175 ml  Net 745 ml   Filed Weights   05/29/19 0450 05/30/19 0622 05/31/19 0537  Weight: 56.7 kg 51.8 kg 55.3 kg    Examination: General exam: Elderly frail lady alert awake comfortable.  Just waking up.  HEENT:Oral mucosa moist, Ear/Nose WNL grossly, dentition normal. Respiratory system: Bilateral equal air entry, no crackles and wheezing, no use of accessory muscle, non tender on palpation. Cardiovascular system: regular rate and rhythm, S1 & S2 heard, No JVD/murmurs. Gastrointestinal  system: Abdomen soft, non-tender, non-distended, BS +. No hepatosplenomegaly palpable. Nervous System: Able to move UE and LE, sensation intact. Extremities:b/l leg edema, distal peripheral pulses palpable.  Skin: No rashes,no icterus. MSK: Normal muscle bulk,tone, power external.  Sternal Incision stable.  Data Reviewed: I have personally reviewed following labs and imaging studies  CBC: Recent Labs  Lab 05/24/19 1117 05/25/19 0439 05/26/19 0931 05/27/19 0745 05/28/19 0402  WBC 12.1* 11.6* 9.7 13.1* 9.9  HGB 10.9* 9.6* 10.4* 10.8* 10.7*  HCT 34.0* 31.3* 34.2* 33.7* 34.5*  MCV 95.5 95.4 96.6 94.4 95.6  PLT 268 230 268 249 539   Basic Metabolic Panel: Recent Labs  Lab 05/25/19 0439 05/26/19 0803 05/27/19 0745 05/28/19 0402 05/29/19 0545  NA 138 137 136 136 133*  K 3.4* 4.3 4.2 4.1 4.6  CL 96* 97* 97* 97* 94*  CO2 31 30 30 27 30   GLUCOSE 86 113* 106* 81 80  BUN 17 15 18 19 20   CREATININE 0.87 0.80 0.80 0.85 0.91  CALCIUM 8.1* 8.8* 8.4* 8.4* 8.4*   GFR: Estimated Creatinine Clearance: 31.6 mL/min (by C-G formula based on SCr of 0.91 mg/dL). Liver Function Tests: Recent Labs  Lab 05/24/19 1117 05/25/19 0439 05/26/19 0803 05/27/19 0745 05/29/19 0545  AST 52* 46* 38 29 26  ALT 155* 122* 115* 85* 61*  ALKPHOS 64 55 63 63 62  BILITOT 1.2 0.9 1.1 1.0 1.1  PROT 5.0* 4.7* 5.7* 5.1* 5.2*  ALBUMIN 2.5* 2.4* 2.9* 2.6* 2.7*   No results for input(s): LIPASE, AMYLASE in the last 168 hours. No results for input(s): AMMONIA in the last 168 hours. Coagulation Profile: No results for input(s): INR, PROTIME in the last 168 hours. Cardiac Enzymes: No results for input(s): CKTOTAL, CKMB, CKMBINDEX, TROPONINI in the last 168 hours. BNP (last 3 results) No results for input(s): PROBNP in the last 8760 hours. HbA1C: No results for input(s): HGBA1C in the last 72 hours. CBG: Recent Labs  Lab 05/24/19 1658  GLUCAP 110*   Lipid Profile: No results for input(s): CHOL, HDL,  LDLCALC, TRIG, CHOLHDL, LDLDIRECT in the last 72 hours. Thyroid Function Tests: Recent Labs    05/31/19 0652  TSH 7.685*   Anemia Panel: No results for input(s): VITAMINB12, FOLATE, FERRITIN, TIBC, IRON, RETICCTPCT in the last 72 hours. Sepsis Labs: No results for input(s): PROCALCITON, LATICACIDVEN in the last 168 hours.  Recent Results (from the past 240 hour(s))  Culture, Urine     Status: Abnormal   Collection Time: 05/24/19  4:11 PM   Specimen: Urine, Clean Catch  Result Value Ref Range Status   Specimen Description URINE, CLEAN CATCH  Final   Special Requests   Final    NONE Performed at [facility], [address], [place], [place] [address]    Culture MULTIPLE SPECIES PRESENT, SUGGEST RECOLLECTION (A)  Final   Report Status 05/26/2019 FINAL  Final  Novel Coronavirus, NAA (hospital order; send-out to ref lab)     Status: None   Collection Time: 05/28/19  9:23 PM   Specimen: Nasopharyngeal Swab; Respiratory  Result Value Ref Range Status   SARS-CoV-2, NAA NOT DETECTED NOT DETECTED Final    Comment: (NOTE) This nucleic acid amplification test was developed and its performance characteristics determined  by [facility]. Nucleic acid amplification tests include PCR and TMA. This test has not been FDA cleared or approved. This test has been authorized by FDA under an Emergency Use Authorization (EUA). This test is only authorized for the duration of time the declaration that circumstances exist justifying the authorization of the emergency use of in vitro diagnostic tests for detection of SARS-CoV-2 virus and/or diagnosis of COVID-19 infection under section 564(b)(1) of the Act, 21 U.S.C. 409WJX-9(J360bbb-3(b) (1), unless the authorization is terminated or revoked sooner. When diagnostic testing is negative, the possibility of a false negative result should be considered in the context of a patient's recent exposures and the presence of clinical signs and symptoms  consistent with COVID-19. An individual without symptoms of COVID- 19 and who is not shedding SARS-CoV-2 vi rus would expect to have a negative (not detected) result in this assay. Performed At: [facility] [address] [place], [place] [id number] Jolene SchimkeNagendra Sanjai MD HY:[id number]:[phone]    Coronavirus Source NASOPHARYNGEAL  Final    Comment: Performed at [facility], [address] [place], [place] [address]         Radiology Studies: No results found.      Scheduled Meds: . [START ON 06/01/2019] amiodarone  100 mg Oral Q T,Th,S,Su  . amiodarone  200 mg Oral Q M,W,F  . apixaban  2.5 mg Oral BID  . atorvastatin  40 mg Oral q1800  . clopidogrel  75 mg Oral Daily  . feeding supplement (ENSURE ENLIVE)  237 mL Oral BID BM  . feeding supplement (NEPRO CARB STEADY)  237 mL Oral TID BM  . furosemide  20 mg Oral Daily  . guaiFENesin  600 mg Oral BID  . levothyroxine  75 mcg Oral Daily  . loratadine  10 mg Oral Daily  . mouth rinse  15 mL Mouth Rinse BID  . mometasone-formoterol  2 puff Inhalation BID  . multivitamin with minerals  1 tablet Oral Daily  . pantoprazole  40 mg Oral BID  . polyethylene glycol  17 g Oral Daily  . sodium chloride flush  3 mL Intravenous Q12H   Continuous Infusions: . sodium chloride Stopped (05/17/19 2000)     LOS: 21 days     Lanae Boastamesh Cambrey Lupi, MD [facility]  If 7PM-7AM, please contact night-coverage [email or web address] Password [email or web address] 05/31/2019, 9:59 AM

## 2019-05-31 NOTE — Plan of Care (Signed)
  Problem: Elimination: Goal: Will not experience complications related to urinary retention Outcome: Completed/Met

## 2019-05-31 NOTE — Progress Notes (Signed)
OT Cancellation Note  Patient Details Name: Amy Chambers MRN: [id number] DOB: 01/23/1928   Cancelled Treatment:    Reason Eval/Treat Not Completed: Other (comment). Pt recently returned back to bed to rest after walking with PT and sitting up this morning. Family and pt requesting OT reattempt later. Plan to reattempt another day.  Tyrone Schimke, OT Acute Rehabilitation Services Pager: [phone] Office: [phone]  05/31/2019, 1:02 PM

## 2019-05-31 NOTE — TOC Transition Note (Signed)
Transition of Care [facility]) - CM/SW Discharge Note   Patient Details  Name: Amy Chambers MRN: [id number] Date of Birth: 1928/06/25  [facility] CM/SW Contact:  Gelene Mink, [place] Phone Number: 05/31/2019, 1:18 PM   Clinical Narrative:     Patient will DC to: [facility] Anticipated DC date: 05/31/2019 Family notified: Yes Transport by: Corey Harold   Per MD patient ready for DC to . RN, patient, patient's family, and facility notified of DC. Discharge Summary and FL2 sent to facility. RN to call report prior to discharge [phone]). The patient will go to room 156. DC packet on chart. Ambulance transport requested for patient.   CSW will sign off for now as social work intervention is no longer needed. Please consult [place] again if new needs arise.  Jeneva Schweizer, LCSW-A [place]/Clinical Social Work Department Cell: [phone]    Final next level of care: [place] Barriers to Discharge: No Barriers Identified   Patient Goals and CMS Choice Patient states their goals for this hospitalization and ongoing recovery are:: Pt will discharge to [facility] for rehab CMS Medicare.gov Compare Post Acute Care list provided to:: Patient Choice offered to / list presented to : Patient  Discharge Placement   Existing PASRR number confirmed : 05/27/19          Patient [facility] bed at: [facility] Patient to be transferred to facility by: [place] Name of family member notified: [name] Patient and family notified of of transfer: 05/31/19  Discharge Plan and Services In-house Referral: Clinical Social Work Discharge Planning Services: NA Post Acute Care Choice: [place]          DME Arranged: N/A DME Agency: NA       HH Arranged: NA HH Agency: NA Date HH Agency Contacted: 05/14/19 Time [place]: 1301 Representative spoke with at [place]: [place] Determinants of Health ([place]) Interventions      Readmission Risk Interventions Readmission Risk Prevention Plan 05/14/2019  Transportation Screening Complete  Home Care Screening Complete  Medication Review (RN CM) Complete  Some recent data might be hidden

## 2019-05-31 NOTE — Discharge Summary (Addendum)
Discharge Summary    Patient ID: Amy Chambers MRN: [id number]; DOB: [birth year]  Admit date: 05/10/2019 Discharge date: 05/31/2019  Primary Care Provider: Bennie Pierini, [name]  Primary Cardiologist: Dina Rich, MD  Primary Electrophysiologist:  None   Discharge Diagnoses    Principal Problem:   Acute respiratory failure with hypoxia and hypercapnia (HCC) Active Problems:   NSTEMI (non-ST elevated myocardial infarction) (HCC)   Hemopericardium   HTN (hypertension)   Hyperlipemia   Hypothyroid   GERD (gastroesophageal reflux disease)   Pericardial tamponade   CAD S/P percutaneous coronary angioplasty   Acute respiratory failure with hypoxia (HCC)   COPD with acute exacerbation (HCC)   Status post coronary artery stent placement   PAF (paroxysmal atrial fibrillation) (HCC)   Sick sinus syndrome (HCC)   Acute blood loss anemia, transfusesd 3 units PRBC   Acute combined systolic and diastolic HF (heart failure) (HCC) EF 45%    Anticoagulation adequate on Eliquis for PAF    Physical deconditioning due to acute illness   Dysphagia   DNR (do not resuscitate)  Dysphagia 2 Diet  PT/OT for weakness after significant illness  Allergies Allergies  Allergen Reactions   Crestor [Rosuvastatin Calcium] Other (See Comments)    Cramping/pain in lower extremeties   Niaspan [Niacin Er] Other (See Comments)    Cramping/pain in lower extremeties    Diagnostic Studies/Procedures    Echo 05/11/19 IMPRESSIONS    1. Left ventricular ejection fraction, by visual estimation, is 45 to 50%. The left ventricle has normal function. Normal left ventricular size. There is no left ventricular hypertrophy.  2. There is mild hypokinesis of the basal to mid inferoseptum/inferior wall. This is concerning for RCA ischemia/infarction.  3. Left ventricular diastolic Doppler parameters are consistent with impaired relaxation pattern of LV diastolic filling.  4. Global right ventricle has  normal systolic function.The right ventricular size is normal. No increase in right ventricular wall thickness.  5. Left atrial size was normal.  6. Right atrial size was normal.  7. Presence of pericardial fat pad.  8. The pericardial effusion is anterior to the right ventricle.  9. Trivial pericardial effusion is present. 10. Moderate mitral annular calcification. 11. The mitral valve is normal in structure. Trace mitral valve regurgitation. No evidence of mitral stenosis. 12. The tricuspid valve is normal in structure. Tricuspid valve regurgitation is mild. 13. The aortic valve is tricuspid Aortic valve regurgitation is trivial by color flow Doppler. Moderate calcification of the AoV with focal calcification of the NCC. No aortic stenosis. 14. The pulmonic valve was grossly normal. Pulmonic valve regurgitation is mild by color flow Doppler. 15. Moderately elevated pulmonary artery systolic pressure. 16. The inferior vena cava is normal in size with greater than 50% respiratory variability, suggesting right atrial pressure of 3 mmHg.  FINDINGS  Left Ventricle: Left ventricular ejection fraction, by visual estimation, is 45 to 50%. The left ventricle has normal function. There is no left ventricular hypertrophy. Normal left ventricular size. Spectral Doppler shows Left ventricular diastolic  Doppler parameters are consistent with impaired relaxation pattern of LV diastolic filling. Normal left ventricular filling pressures. There is mild hypokinesis of the basal to mid inferoseptum/inferior wall. This is concerning for RCA  ischemia/infarction.  Right Ventricle: The right ventricular size is normal. No increase in right ventricular wall thickness. Global RV systolic function is has normal systolic function. The tricuspid regurgitant velocity is 3.38 m/s, and with an assumed right atrial pressure  of 3 mmHg, the estimated right ventricular  systolic pressure is moderately elevated at 48.7  mmHg.  Left Atrium: Left atrial size was normal in size.  Right Atrium: Right atrial size was normal in size  Pericardium: Trivial pericardial effusion is present. The pericardial effusion is anterior to the right ventricle. Presence of pericardial fat pad.  Mitral Valve: The mitral valve is normal in structure. Moderate mitral annular calcification. No evidence of mitral valve stenosis by observation. Trace mitral valve regurgitation.  Tricuspid Valve: The tricuspid valve is normal in structure. Tricuspid valve regurgitation is mild by color flow Doppler.  Aortic Valve: The aortic valve is tricuspid. Aortic valve regurgitation is trivial by color flow Doppler. Moderate calcification of the AoV with focal calcification of the NCC. No aortic stenosis.  Pulmonic Valve: The pulmonic valve was grossly normal. Pulmonic valve regurgitation is mild by color flow Doppler.  Aorta: The aortic root, ascending aorta and aortic arch are all structurally normal, with no evidence of dilitation or obstruction.  Venous: The inferior vena cava is normal in size with greater than 50% respiratory variability, suggesting right atrial pressure of 3 mmHg.  IAS/Shunts: No atrial level shunt detected by color flow Doppler. No ventricular septal defect is seen or detected. There is no evidence of an atrial septal defect.     LEFT VENTRICLE PLAX 2D LVIDd:         4.40 cm  Diastology LVIDs:         2.70 cm  LV e' lateral:   8.27 cm/s LV PW:         1.00 cm  LV E/e' lateral: 12.6 LV IVS:        0.80 cm  LV e' medial:    4.90 cm/s LVOT diam:     1.80 cm  LV E/e' medial:  21.2 LV SV:         61 ml LV SV Index:   40.17 LVOT Area:     2.54 cm   Cardiac cath 05/14/19   Prox RCA lesion is 30% stenosed.  Prox RCA to Mid RCA lesion is 80% stenosed.  1st Mrg lesion is 40% stenosed.  Mid Cx lesion is 20% stenosed.  Mid LAD lesion is 20% stenosed.  Dist RCA lesion is 20% stenosed.  A stent was  successfully placed.  Post intervention, there is a 0% residual stenosis.  Post intervention, there is a 0% residual stenosis.  Mid RCA to Dist RCA lesion is 20% stenosed.  LV end diastolic pressure is moderately elevated.   Coronary artery obstructive disease with mild coronary calcification.  The LAD has 20% mild stenosis in its proximal to mid segment and dips intramyocardially in the mid LAD without systolic muscle bridging; there is mild 20% proximal circumflex stenosis with 40% narrowing and a small first marginal branch ostially.  RCA is a very large dominant vessel with coronary calcification and sharp angulation proximally and in its mid segment.  There is 70 -80% stenosis proximal to mid segment with TIMI-3 flow and mild 20% distal RCA stenoses.  LV dysfunction with EF estimate at 50% with mid to basal inferior hypocontractility.  LVEDP elevated at 28 mm.  Very difficult but successful percutaneous coronary intervention involving the calcified sharp angled proximal to mid RCA with ultimate PTCA, and tandem stenting necessitating Guideliner support ultimate insertion of a 3.5 x 20 mm Synergy and 3.0 x 12 mm Synergy stents postdilated to 3.6 to 3.5 mm with the region being reduced to 0%.  RECOMMENDATION: DAPT for a minimum of 12  months.  Aggressive lipid-lowering therapy with target LDL less than 70.  Optimal blood pressure control with target blood pressure less than 130/80.    05/14/19  Limited Echo  IMPRESSIONS    1. Left ventricular ejection fraction, by visual estimation, is 55%. The left ventricle has normal function. Normal left ventricular size. There is mildly increased left ventricular hypertrophy. The left ventricle was not fully evaluated, unable to  comment on wall motion pattern.  2. Left ventricular diastolic function could not be evaluated pattern of LV diastolic filling.  3. Global right ventricle has normal systolic function.The right ventricular size is  normal. No increase in right ventricular wall thickness.  4. Left atrial size was normal.  5. Right atrial size was normal.  6. Mild mitral annular calcification.  7. The mitral valve is normal in structure. No evidence of mitral valve regurgitation. No evidence of mitral stenosis.  8. The tricuspid valve is not assessed. Tricuspid valve regurgitation was not assessed by color flow Doppler.  9. The aortic valve was not assessed Aortic valve regurgitation was not assessed by color flow Doppler. Mild aortic valve sclerosis without stenosis. 10. The pulmonic valve was not assessed. Pulmonic valve regurgitation was not assessed by color flow Doppler. 11. The interatrial septum was not assessed. 12. Moderate pericardial effusion with extensive fibrinous material suggestive of hematoma. There was > 25% respirophasic variation of mitral E inflow velocity. The IVC measured 1.8 cm but collapsed < 50%. There was RV diastolic indentation noted.  Findings suggestive of tamponade.  FINDINGS  Left Ventricle: Left ventricular ejection fraction, by visual estimation, is 55%. The left ventricle has normal function. There is mildly increased left ventricular hypertrophy. Normal left ventricular size. Spectral Doppler shows Left ventricular  diastolic function could not be evaluated pattern of LV diastolic filling.  Right Ventricle: The right ventricular size is normal. No increase in right ventricular wall thickness. Global RV systolic function is has normal systolic function.  Left Atrium: Left atrial size was normal in size.  Right Atrium: Right atrial size was normal in size  Pericardium: A moderately sized pericardial effusion is present. Moderate pericardial effusion with extensive fibrinous material suggestive of hematoma. There was > 25% respirophasic variation of mitral E inflow velocity. The IVC measured 1.8 cm but  collapsed < 50%. There was RV diastolic indentation noted. Findings suggestive of  tamponade.  Mitral Valve: The mitral valve is normal in structure. Mild mitral annular calcification. No evidence of mitral valve stenosis by observation. No evidence of mitral valve regurgitation.  Tricuspid Valve: The tricuspid valve is not assessed. Tricuspid valve regurgitation was not assessed by color flow Doppler.  Aortic Valve: The aortic valve was not assessed. Aortic valve regurgitation was not assessed by color flow Doppler. Mild aortic valve sclerosis is present, with no evidence of aortic valve stenosis.  Pulmonic Valve: The pulmonic valve was not assessed. Pulmonic valve regurgitation was not assessed by color flow Doppler.  Aorta: The aortic root is normal in size and structure.  Shunts: The interatrial septum was not assessed.   cardiac cath 05/14/19  No evidence of extravasation noted. Late venous phase contrast filling of what appears to be the right atrium noted, cannot exclude AV fistula filling the pericardium however.  Ost RCA lesion is 55% stenosed. Prior to the stent  Previously placed Prox RCA stent (drug-eluting) is widely patent.  Previously placed Prox RCA to Mid RCA stent (drug-eluting) is widely patent.  LV end diastolic pressure is normal.   SUMMARY  No obvious evidence of extravasation from the PCI site  Upstream 50% focal lesion (proximal from stent)  Relatively normal LVEDP with mild systemic hypotension on pressors  Likely metabolic acidosis with initial pH of 7.21 -> was given 1 amp of bicarb   RECOMMENDATION  The patient will go from Cath Lab to the OR for likely anterior approach pericardial window and VATS (will likely need to explore for source of bleed)  I have written to hold Brilinta.  Once stable postop, would need to restart  Would for now treat the proximal pre-stent stenosis medically => desire to avoid further intervention at this time.   05/15/19 OP note POST-OPERATIVE DIAGNOSIS:  1. S/p NSTEMI 2. S/p PCI with  Synergy DES stents x 2 to RCA 3.Hemopericardium  PROCEDURE:  EMERGENT MEDIAN STERNOTOMY FINDINGS: Clot found along right side of heart    _____________   History of Present Illness     Amy Chambers is a 85 y.o. female with hx COPD and admitted to [facility] for SOB and elevation of Troponin to 975.  She had taken ABX for ear pain , was resting and became significantly SOB.  SP02 was in the 60s and  EMS called.  Treated with steroids, epinephrine and transferred to [facility] from [facility].   CT of chest with coronary calcifications and concern for ischemic event.    Hospital Course     Consultants: Cardiology, Dr Wyline Mood, TCTS Dr. Cliffton Asters, CCM for vent management,   EP Dr. Ladona Ridgel - hospitalist for failure to thrive.  PT/OT   At [facility] ON IV heparin  Her BNP was 1600.  Plan for ischemic eval once pt stable from respiratory status.  She continued to improve and plans were made for cath on 05/14/19.  Troponin did pk at 1600.   Echo wit EF 45%, with hypokinesis of the septum.    Pt underwent cardiac cath and rec'd PCI  prox to mid RCA with PTCA and tandem stenting.  DES plan for DAPT for 12 months.  Later that day she became hypotensive, to 80/40, given several boluses of fluid, then levophed. Central line was placed.  Concern for retroperitoneal bleed so emergent CT of abd and chest with no bleed retro peritoneal but pericardial effusion up to 11 mm thickness seen.  Pt then taken to cath lab for relook of coronary arteries to exclude ongoing bleeding from the RCA.  Cath was stable TCTS called for pericardial window with the amount of thrombus present and pericardiocentesis would not be definitive treatment.  Dr. Cliffton Asters took pt to OR after discussing with pt and family for hemopericardium.  She had sternotomy and mediastinal washout.  250 ml of blood evacuated.   There was a perforation noted along the posterior wall in the distribution of the posterior descending artery.  There was no active  bleeding coming from this perforation.  Pt left OR with chest tubes and on vent.   She did receive 3 units PRBC.   She began improving and levo was slowly weaned  She had developed a fib with acute illness and with IV amiodarone she was maintaining SR then back in and out of a fib. .  ASA and Brilinta resumed.   Pt extubated 05/16/19 and weaned off Levophed.   For her COPD exacerbation she completed 5 days of steroids/ABX.  05/17/19 chest tube removed, she was still in and out of A fib.and would have conversion pauses.  EP was consulted "She has symptomatic tachy-brady syndrome  with PAF and pasuses. She has been started on an oral amio load which she is tolerating. Hopefully the atrial fib and long pauses will improve. If not she will need a PPM. I suspect that the atrial fib is being driven by pericardial inflammation which should get better in the coming weeks. "   With PAF, her Brilinta was changed to Plavix and her ASA was stopped.  Eliquis was added.    Pt continued to slowly improve but would need rehab, SNF was decided.  The last week she did have some bradycardia and rare PAF with shorter conversion pauses. Her BB was stopped and her amiodarone was adjusted to 100 mg alt with 200 mg 200 MWF and 100 mg T,TH, Sat and Sunday.  She has been weaned off oxygen and ambulates with sp02 93 or greater.   She has had some confusion but cleared today.  Thought to be due to illness and age.  She had swallowing difficulties speech evaluated and on dysphasia 2 diet.  She needs supplementation for nutrition.   Her TSH mildly elevated will need to monitor on amiodarone.  She does have microscopic hematuria on plavix and eliquis will need to monitor.  No UTI.  She had mildly elevated transaminases but improved and ultrasound no bilary sludge.  She was seen and evaluated by Dr. Claiborne Billings and found stable for discharge.  Pt is a DNR and papers were signed.    She is being discharged to [place] home and will follow up  as outpt.   Did the patient have an acute coronary syndrome (MI, NSTEMI, STEMI, etc) this admission?:  Yes                               AHA/ACC Clinical Performance & Quality Measures: 1. Aspirin prescribed? - No - on Eliquis and Plavix and post acute blood loss 2. ADP Receptor Inhibitor (Plavix/Clopidogrel, Brilinta/Ticagrelor or Effient/Prasugrel) prescribed (includes medically managed patients)? - Yes 3. Beta Blocker prescribed? - No - bradycardia 4. High Intensity Statin (Lipitor 40-80mg  or Crestor 20-40mg ) prescribed? - Yes 5. EF assessed during THIS hospitalization? - Yes 6. For EF <40%, was ACEI/ARB prescribed? - Not Applicable (EF >/= 67%) 7. For EF <40%, Aldosterone Antagonist (Spironolactone or Eplerenone) prescribed? - Not Applicable (EF >/= 61%) 8. Cardiac Rehab Phase II ordered (Included Medically managed Patients)? - Yes   _____________  Discharge Vitals Blood pressure (!) 113/55, pulse 68, temperature (!) 97.5 F (36.4 C), temperature source Oral, resp. rate 19, height 5\' 1"  (1.549 m), weight 55.3 kg, SpO2 96 %.  Filed Weights   05/29/19 0450 05/30/19 0622 05/31/19 0537  Weight: 56.7 kg 51.8 kg 55.3 kg    Labs & Radiologic Studies    CBC No results for input(s): WBC, NEUTROABS, HGB, HCT, MCV, PLT in the last 72 hours. Basic Metabolic Panel Recent Labs    05/29/19 0545  NA 133*  K 4.6  CL 94*  CO2 30  GLUCOSE 80  BUN 20  CREATININE 0.91  CALCIUM 8.4*   Liver Function Tests Recent Labs    05/29/19 0545  AST 26  ALT 61*  ALKPHOS 62  BILITOT 1.1  PROT 5.2*  ALBUMIN 2.7*   No results for input(s): LIPASE, AMYLASE in the last 72 hours. High Sensitivity Troponin:   Recent Labs  Lab 05/10/19 1303 05/10/19 1527 05/10/19 1906 05/12/19 0653 05/12/19 0908  TROPONINIHS 44* 499* 975* 1,563* 1,288*  BNP Invalid input(s): POCBNP D-Dimer No results for input(s): DDIMER in the last 72 hours. Hemoglobin A1C No results for input(s): HGBA1C in the last  72 hours. Fasting Lipid Panel No results for input(s): CHOL, HDL, LDLCALC, TRIG, CHOLHDL, LDLDIRECT in the last 72 hours. Thyroid Function Tests Recent Labs    05/31/19 0652  TSH 7.685*   _____________  Ct Abdomen Pelvis Wo Contrast  Result Date: 05/14/2019 CLINICAL DATA:  85 year old female with back pain and hypertension. Status post catheterization. Rule out retroperitoneal hematoma. EXAM: CT ABDOMEN AND PELVIS WITHOUT CONTRAST TECHNIQUE: Multidetector CT imaging of the abdomen and pelvis was performed following the standard protocol without IV contrast. COMPARISON:  Chest CT dated 05/10/2019 FINDINGS: Evaluation of this exam is limited in the absence of intravenous contrast. Lower chest: Mild emphysematous changes of the visualized lung bases. Several punctate calcified granuloma at the left lung base. Probable trace right pleural effusion. Partially visualized high attenuating pericardial effusion measuring up to 8 mm in thickness most consistent with hemopericardium. This is new since the prior CT. Clinical correlation is recommended. There is coronary vascular calcification. No intra-abdominal free air or free fluid. No intraperitoneal or retroperitoneal hematoma. Hepatobiliary: Several hepatic hypodense lesions which are not completely characterized this non-contrast CT and measure up to approximately 3 cm. These demonstrate fluid attenuation most consistent with cysts. High attenuating content within the gallbladder likely represents vicarious excretion of injected contrast from recent catheterization. Small gallstones are not excluded. No pericholecystic fluid. Pancreas: Unremarkable. No pancreatic ductal dilatation or surrounding inflammatory changes. Spleen: Normal in size without focal abnormality. Adrenals/Urinary Tract: The adrenal glands are unremarkable. Enhancing renal parenchyma secondary to recent contrast administration. Multiple bilateral renal cysts measure up to 3.3 cm on the  left. Additional smaller hypodensities are too small to characterize. There is no hydronephrosis on either side. Excreted contrast noted the partially distended urinary bladder. Stomach/Bowel: There is sigmoid diverticulosis without active inflammatory changes. There is no bowel obstruction or active inflammation. The appendix is normal. There is a 4 cm duodenal diverticula. Vascular/Lymphatic: Advanced aortoiliac atherosclerotic disease. Left femoral venous line with tip in the left common femoral vein. Right common iliac artery catheterization with tip in the right external iliac artery. No hematoma. The IVC is grossly unremarkable. Small pocket of air in the upper IVC, likely introduced via catheterization. No portal venous gas. There is no adenopathy. Reproductive: The uterus and ovaries are grossly unremarkable. Other: None Musculoskeletal: Osteopenia with degenerative changes of the spine. No acute osseous pathology. IMPRESSION: 1. No acute intra-abdominal or pelvic pathology. No intraperitoneal or retroperitoneal hematoma. 2. Partially visualized hemopericardium. Clinical correlation is recommended. 3. Small pocket of air within the upper IVC, iatrogenic. 4. Sigmoid diverticulosis. 5. Advanced aortic Atherosclerosis (ICD10-I70.0). These results were called by telephone at the time of interpretation on 05/14/2019 at 7:52 pm to provider O'Neal, who verbally acknowledged these results. Electronically Signed   By: Elgie Collard M.D.   On: 05/14/2019 19:56   Dg Chest 2 View  Result Date: 05/24/2019 CLINICAL DATA:  Acute respiratory failure with hypoxia hypercapnia. History of COPD. EXAM: CHEST - 2 VIEW COMPARISON:  CT chest, abdomen and pelvis 05/14/2019. Single-view of the chest 05/15/2019 and 05/16/2019. FINDINGS: Small layering pleural effusions are present bilaterally. The lungs are emphysematous but clear. Cardiopericardial silhouette is enlarged compatible pericardial effusion seen on the prior CT.  IMPRESSION: Small layering pleural effusions. Enlarged cardiopericardial silhouette compatible with pericardial effusion as seen on prior CT. Emphysema. Electronically Signed   By: Maisie Fus  Dalessio M.D.   On: 05/24/2019 17:12   Dg Abd 1 View  Result Date: 05/15/2019 CLINICAL DATA:  Orogastric tube placement. EXAM: ABDOMEN - 1 VIEW COMPARISON:  07/02/2018 and chest x-ray 05/15/2019 FINDINGS: Evidence of patient's nasogastric tube with tip over the mid abdomen just right of midline likely over the distal stomach. Bowel gas pattern is nonobstructive. Remainder of the exam is unchanged including 2 catheters overlying the heart. IMPRESSION: Nonobstructive bowel gas pattern. Enteric tube with tip just right of midline in the mid abdomen likely over the distal stomach. Electronically Signed   By: Elberta Fortisaniel  Boyle M.D.   On: 05/15/2019 16:46   Ct Soft Tissue Neck Wo Contrast  Result Date: 05/10/2019 CLINICAL DATA:  Arterial stricture/occlusion head neck. COPD with short of breath and respiratory distress. EXAM: CT NECK WITHOUT CONTRAST TECHNIQUE: Multidetector CT imaging of the neck was performed following the standard protocol without intravenous contrast. COMPARISON:  None. FINDINGS: Pharynx and larynx: Normal. No mass or swelling. Normal airway. No pharyngeal edema. Salivary glands: No inflammation, mass, or stone. Thyroid: Negative Lymph nodes: No pathologic lymph nodes in the neck. Vascular: Limited vascular evaluation without intravenous contrast. Arterial stricture or stenosis not evaluated on the study. There is atherosclerotic calcification in the carotid bulb bilaterally. Atherosclerotic calcification also in the cavernous carotid bilaterally. Limited intracranial: Negative Visualized orbits: Negative Mastoids and visualized paranasal sinuses: Negative Skeleton: Cervical spondylosis.  No acute skeletal abnormality. Upper chest: Mild apical emphysema. No acute abnormality in the lung apices. Other: None  IMPRESSION: No acute abnormality Atherosclerotic calcification of the carotid arteries bilaterally. Arterial stricture occlusion not excluded on this study without intravenous contrast Airway intact. Electronically Signed   By: Marlan Palauharles  Clark M.D.   On: 05/10/2019 17:13   Ct Angio Chest Pe W And/or Wo Contrast  Result Date: 05/10/2019 CLINICAL DATA:  COPD, hypertension, respiratory distress, high pretest probability for PE EXAM: CT ANGIOGRAPHY CHEST WITH CONTRAST TECHNIQUE: Multidetector CT imaging of the chest was performed using the standard protocol during bolus administration of intravenous contrast. Multiplanar CT image reconstructions and MIPs were obtained to evaluate the vascular anatomy. CONTRAST:  75mL OMNIPAQUE IOHEXOL 350 MG/ML SOLN COMPARISON:  None. FINDINGS: Cardiovascular: Heart size upper limits normal. Some reflux of contrast from the right atrium into hepatic veins. The RV is nondilated. Satisfactory opacification of pulmonary arteries noted, and there is no evidence of pulmonary emboli. Moderate coronary calcifications. Fair contrast opacification of the thoracic aorta with no suggestion of dissection, aneurysm, or stenosis. There is classic 3-vessel brachiocephalic arch anatomy without proximal stenosis. Calcified plaque in the arch and descending thoracic segment. Visualized proximal abdominal aorta is atheromatous without aneurysm. Mediastinum/Nodes:  no hilar or mediastinal adenopathy. Lungs/Pleura: No pleural effusion. No pneumothorax. Pulmonary emphysema. Upper Abdomen: 2.9 cm probable cyst in hepatic segment 4A. Smaller low-attenuation lesions in the right hepatic lobe are incompletely visualized and characterized. No acute findings. Musculoskeletal: Anterior vertebral endplate spurring at multiple levels in the mid and lower thoracic spine. No fracture or worrisome bone lesion. Review of the MIP images confirms the above findings. IMPRESSION: 1. Negative for acute PE or thoracic  aortic dissection. 2. Coronary and Aortic Atherosclerosis (ICD10-170.0). Electronically Signed   By: Corlis Leak  Hassell M.D.   On: 05/10/2019 17:07   Dg Chest Port 1 View  Result Date: 05/16/2019 CLINICAL DATA:  ETT placement EXAM: PORTABLE CHEST 1 VIEW COMPARISON:  05/15/2019 FINDINGS: Endotracheal tube with the tip 3 cm above the carina. Nasogastric tube coursing below the diaphragm. Left-sided chest tube  in unchanged position. Left jugular central venous catheter with the tip projecting over the SVC. Mediastinal drain in unchanged position. No focal consolidation, pleural effusion or pneumothorax. Mild left basilar atelectasis. Stable cardiomediastinal silhouette. Prior median sternotomy. IMPRESSION: 1. Support lines and tubing in satisfactory position. Electronically Signed   By: Elige Ko   On: 05/16/2019 08:07   Dg Chest Port 1 View  Result Date: 05/15/2019 CLINICAL DATA:  Central line placement EXAM: PORTABLE CHEST 1 VIEW COMPARISON:  05/15/2019, 5:23 a.m. FINDINGS: Interval placement of a left neck vascular catheter, tip projecting over the midportion of the SVC. Otherwise unchanged examination with endotracheal tube projecting above the carina, chest or mediastinal drainage tubes, and no acute abnormality of the lungs and no significant pneumothorax. IMPRESSION: 1. Interval placement of a left neck vascular catheter, tip projecting over the midportion of the SVC. 2. Otherwise unchanged examination with endotracheal tube projecting above the carina, chest or mediastinal drainage tubes, and no acute abnormality of the lungs and no significant pneumothorax. Electronically Signed   By: Lauralyn Primes M.D.   On: 05/15/2019 11:33   Dg Chest Port 1 View  Result Date: 05/15/2019 CLINICAL DATA:  ET tube, chest tube, pneumothorax EXAM: PORTABLE CHEST 1 VIEW COMPARISON:  05/12/2019 FINDINGS: Endotracheal tube tip is 2.5 cm above the carina. Left basilar chest tube or mediastinal drain is in place. No  pneumothorax. Bibasilar atelectasis. No visible effusions. IMPRESSION: Bibasilar atelectasis. No visible pneumothorax. Electronically Signed   By: Charlett Nose M.D.   On: 05/15/2019 08:29   Dg Chest Port 1 View  Result Date: 05/12/2019 CLINICAL DATA:  Shortness of breath and lethargy EXAM: PORTABLE CHEST 1 VIEW COMPARISON:  05/10/2019, 03/04/2019 FINDINGS: Hyperinflated lungs with emphysematous disease. Mild scarring at the bases. No consolidation or effusion. Stable cardiomediastinal silhouette with aortic atherosclerosis. Probable skin fold artifact over the right upper and lower chest. IMPRESSION: 1. Hyperinflated lungs with emphysematous disease. No focal pulmonary infiltrate. 2. Suspected skin fold artifact over the right upper lung, no definitive pneumothorax seen. Electronically Signed   By: Jasmine Pang M.D.   On: 05/12/2019 16:12   Dg Chest Portable 1 View  Result Date: 05/10/2019 CLINICAL DATA:  Pt brought in by [facility] with c/o SOB that started suddenly around noon. EMS reports no air movement, O2 sat 61% on RA, diaphoretic. EMS reports pt has become less lethargic. EMS gave 2 Albuterol neb, Solumedrol, Epi 0.15 subq. O2 sat increased to 98% with NRB but no change in mental status and pt denies SOB getting better. EMS reports pt took 1st dose of Amoxicillin at 1030 for ear pain that pt has been having for 2 days. Pt has taken PCN before with no problems.HISTORY OF HTN, COPD EXAM: PORTABLE CHEST 1 VIEW COMPARISON:  03/04/2019 and older exams. FINDINGS: Cardiac silhouette is top-normal in size. No mediastinal hilar masses. Prominent bronchovascular markings, stable. Lungs hyperexpanded but otherwise clear with no evidence of pneumonia or edema. No convincing pleural effusion and no pneumothorax. Skeletal structures are demineralized but grossly intact. IMPRESSION: 1. No acute cardiopulmonary disease. Electronically Signed   By: Amie Portland M.D.   On: 05/10/2019 13:38   Ct Angio Chest/abd/pel For  Dissection W And/or W/wo  Result Date: 05/14/2019 CLINICAL DATA:  85 year old female status post cardiac catheterization. Concern for dissection. EXAM: CT ANGIOGRAPHY CHEST, ABDOMEN AND PELVIS TECHNIQUE: Multidetector CT imaging through the chest, abdomen and pelvis was performed using the standard protocol during bolus administration of intravenous contrast. Multiplanar reconstructed images and MIPs  were obtained and reviewed to evaluate the vascular anatomy. CONTRAST:  OMNIPAQUE IOHEXOL 350 MG/ML SOLN COMPARISON:  Earlier CT of the abdomen pelvis dated 05/14/2019 FINDINGS: CTA CHEST FINDINGS Cardiovascular: There is no cardiomegaly. High attenuating pericardial effusion measures up to 11 mm in thickness. This is most consistent with hemopericardium. There is multi vessel coronary vascular calcification. Evaluation of the heart is limited due to cardiac motion. There is moderate calcified and noncalcified plaque of the thoracic aorta. No aneurysmal dilatation or dissection. The visualized origins of the great vessels of the aortic arch appear patent. There is a diminutive left vertebral artery. The central pulmonary arteries appear patent. Mediastinum/Nodes: No hilar or mediastinal adenopathy. The esophagus is grossly unremarkable. No mediastinal fluid collection. Lungs/Pleura: There is emphysematous changes of the lungs. Trace bilateral pleural effusions. No focal consolidation or pneumothorax. The central airways are patent. Musculoskeletal: Degenerative changes of the spine. No acute osseous pathology. Review of the MIP images confirms the above findings. CTA ABDOMEN AND PELVIS FINDINGS VASCULAR Aorta: Advanced atherosclerotic calcification. No aneurysmal dilatation or dissection. Celiac: Atherosclerotic calcification and narrowing of the origin of the celiac axis. The celiac axis remains patent. SMA: Atherosclerotic calcification and narrowing of the origin of the SMA. The SMA is patent. Renals:  Atherosclerotic calcification of the origins of the renal arteries. The renal arteries remain patent although appear narrowed. Accessory arteries noted to the lower pole of the kidneys bilaterally. IMA: The IMA is patent. Inflow: Advanced atherosclerotic calcification of the iliac arteries. There is irregularity of the common iliac arteries with focal ectasia of the right common iliac artery measuring up to 13 mm. The iliac arteries remain patent. Right femoral artery catheter with tip in the right external iliac artery noted. Veins: No obvious venous abnormality within the limitations of this arterial phase study. A small pocket of air in the upper IVC, iatrogenic. Left common femoral vein catheter with tip left common iliac vein. No fluid collection or hematoma. Review of the MIP images confirms the above findings. NON-VASCULAR Hepatobiliary: Multiple hepatic hypodense lesions, likely cysts. Mild irregularity of the liver contour likely cirrhosis. There appears to be partial cannulization of the paraumbilical vein indicative of portal hypertension. No intrahepatic biliary ductal dilatation. Excreted contrast within the gallbladder. Pancreas: Unremarkable. No pancreatic ductal dilatation or surrounding inflammatory changes. Spleen: Normal in size without focal abnormality. Adrenals/Urinary Tract: The adrenal glands are unremarkable. Bilateral renal cysts measuring up to 4 cm and smaller hypodense lesions which are not characterized. Hyperenhancement of the renal parenchyma concerning for a degree of renal dysfunction. Clinical correlation is recommended. No hydronephrosis. The urinary bladder is filled with excreted contrast. Stomach/Bowel: There is a 4 cm duodenal diverticulum. There is sigmoid diverticulosis without active inflammatory changes. A rectal tube is in place. There is no bowel obstruction. The appendix is normal. Lymphatic: No adenopathy. Reproductive: The uterus and ovaries are grossly unremarkable.  Other: No intra-abdominal fluid or free air. No hematoma. Musculoskeletal: Osteopenia with degenerative changes of the spine. No acute osseous pathology. Review of the MIP images confirms the above findings. IMPRESSION: 1. No CT evidence of aortic dissection or aneurysm.  No hematoma. 2. High attenuating pericardial effusion most consistent with hemopericardium. 3. Coronary vascular calcification. 4. Colonic diverticulosis. No bowel obstruction or active inflammation. Normal appendix. 5. Hyperenhancement of the renal parenchyma concerning for a degree of renal dysfunction. Clinical correlation is recommended. 6. Aortic Atherosclerosis (ICD10-I70.0) and Emphysema (ICD10-J43.9). Electronically Signed   By: Elgie Collard M.D.   On: 05/14/2019 21:03  US Abdomen Limited Ruq  Result Date: 05/24/2019 CLINICAL DATA:  Leukocytosis, elevated LFTs. EXAM: ULTRASOUND ABDOMEN LIMITED RIGHT UPPER QUADRANT COMPARISON:  CT abdomen pelvis 05/14/2019 FINDINGS: Gallbladder: Small amount of biliary sludge. No gallstones or wall thickening visualized. No sonographic Murphy sign noted by sonographer. Common bile duct: Diameter: 0.4 cm Liver: There are two hepatic cysts as seen on prior CT, one in the posterior right hepatic lobe measuring 1.6 x 1.2 x 1.6 cm and another in the right hepatic lobe measuring 4.8 x 2.7 x 4.1 cm. Within normal limits in parenchymal echogenicity. Portal vein is patent on color Doppler imaging with normal direction of blood flow towards the liver. Other: None. IMPRESSION: 1. No sonographic finding to explain the patient's leukocytosis and elevated LFTs. 2. Small amount of biliary sludge. No evidence of acute gallbladder inflammation. 3.  Hepatic cysts. Electronically Signed   By: Emmaline Kluver M.D.   On: 05/24/2019 21:14   Disposition   Pt is being discharged home today in good condition.  Follow-up Plans & Appointments  Dysphasia 2 Diet   Physical therapy and OT  Do not stop Eliquis or  Plavix.    Discharge Instructions:  1. You may shower, please wash incisions daily with soap and water and keep dry.  If you wish to cover wounds with dressing you may do so but please keep clean and change daily.  No tub baths or swimming until incisions have completely healed.  If your incisions become red or develop any drainage please call our office at [phone]  2. No Driving until cleared by Dr. Lucilla Lame office and you are no longer using narcotic pain medications  3. Fever of 101.5 for at least 24 hours with no source, please contact our office at [phone]  4. Activity- up as tolerated, please walk at least 3 times per day.  Avoid strenuous activity, no lifting, pushing, or pulling with your arms over 8-10 lbs for a minimum of 6 weeks  5. If any questions or concerns arise, please do not hesitate to contact our office at [phone]     Contact information for follow-up providers    [facility] Follow up.   Why: Physical Therapy-office to call with visit times. Contact information: [address] [place] [address] [phone]       Corliss Skains, MD. Go on 06/07/2019.   Specialty: Thoracic Surgery Why: PA/LAT CXR to be taken (at [facility] which is in the same building as Dr. Lucilla Lame office on 10/23 at 11:30 am ; Appointment time is at 12:00 pm Contact information: [address] [place] [address] [phone]        Dyann Kief, PA-C Follow up on 06/10/2019.   Specialty: Cardiology Why: [facility] office (located in [facility]) - 06/10/19 at 2pm. Elon Jester is one of our PAs that works closely with Dr. Wyline Mood and our cardiology team. Contact information: [address] [place] [address] [phone]            Contact information for after-discharge care    Destination    [facility] .   Service: Skilled Nursing Contact  information: [address] [place] [address] [phone]                 Discharge Instructions    Amb Referral to Cardiac Rehabilitation   Complete by: As directed    Diagnosis:  Coronary  Stents PTCA NSTEMI Other     After initial evaluation and assessments completed: Virtual Based Care may be provided alone or in conjunction with Phase 2 Cardiac Rehab based on patient barriers.: Yes      Discharge Medications   Allergies as of 05/31/2019      Reactions   Crestor [rosuvastatin Calcium] Other (See Comments)   Cramping/pain in lower extremeties   Niaspan [niacin Er] Other (See Comments)   Cramping/pain in lower extremeties      Medication List    STOP taking these medications   albuterol 108 (90 Base) MCG/ACT inhaler Commonly known as: VENTOLIN HFA   amoxicillin-clavulanate 875-125 MG tablet Commonly known as: AUGMENTIN   aspirin 81 MG tablet   benazepril 20 MG tablet Commonly known as: LOTENSIN   budesonide-formoterol 160-4.5 MCG/ACT inhaler Commonly known as: SYMBICORT   fluticasone 50 MCG/ACT nasal spray Commonly known as: FLONASE     TAKE these medications   acetaminophen 325 MG tablet Commonly known as: TYLENOL Take 2 tablets (650 mg total) by mouth every 6 (six) hours as needed for mild pain or headache.   amiodarone 100 MG tablet Commonly known as: PACERONE Take 1 tablet (100 mg total) by mouth every Tuesday, Thursday, Saturday, and Sunday. Start taking on: June 01, 2019   amiodarone 200 MG tablet Commonly known as: PACERONE Take 1 tablet (200 mg total) by mouth every Monday, Wednesday, and Friday. Start taking on: June 03, 2019   apixaban 2.5 MG Tabs tablet Commonly known as: ELIQUIS Take 1 tablet (2.5 mg total) by mouth 2 (two) times daily.   atorvastatin 40 MG tablet Commonly known as: LIPITOR Take 1 tablet (40 mg total) by mouth daily at 6 PM.   bisacodyl 10 MG suppository Commonly known as:  DULCOLAX Place 1 suppository (10 mg total) rectally daily as needed for moderate constipation.   calcium-vitamin D 250-100 MG-UNIT tablet Take 1 tablet by mouth 2 (two) times daily.   clopidogrel 75 MG tablet Commonly known as: PLAVIX Take 1 tablet (75 mg total) by mouth daily. Start taking on: June 01, 2019   diphenhydrAMINE-zinc acetate cream Commonly known as: BENADRYL Apply topically 2 (two) times daily as needed for itching.   feeding supplement (ENSURE ENLIVE) Liqd Take 237 mLs by mouth 2 (two) times daily between meals.   feeding supplement (NEPRO CARB STEADY) Liqd Take 237 mLs by mouth 3 (three) times daily between meals.   furosemide 20 MG tablet Commonly known as: LASIX Take 1 tablet (20 mg total) by mouth daily. Start taking on: June 01, 2019   guaiFENesin 600 MG 12 hr tablet Commonly known as: MUCINEX Take 1 tablet (600 mg total) by mouth 2 (two) times daily.   levothyroxine 75 MCG tablet Commonly known as: SYNTHROID Take 1 tablet (75 mcg total) by mouth daily.   loratadine 10 MG tablet Commonly known as: CLARITIN Take 1 tablet (10 mg total) by mouth daily. Start taking on: June 01, 2019   mometasone-formoterol 200-5 MCG/ACT Aero Commonly known as: DULERA Inhale 2 puffs into the lungs 2 (two) times daily.   multivitamin with minerals Tabs tablet Take 1 tablet by mouth daily. Start taking on: June 01, 2019   pantoprazole 40 MG tablet Commonly known as: PROTONIX Take 1 tablet (40 mg total) by mouth 2 (two) times daily.   polyethylene glycol 17 g packet Commonly known as: MIRALAX / GLYCOLAX Take 17 g by mouth daily.   VITAMIN D PO Take 1 capsule by mouth daily.  Durable Medical Equipment  (From admission, onward)         Start     Ordered   05/14/19 1313  For home use only DME Walker rolling  Once    Comments: 5" wheels  Question:  Patient needs a walker to treat with the following condition  Answer:  Ambulatory  dysfunction   05/14/19 1312             Outstanding Labs/Studies    2 week monitor in the next month to eval HR.  Recheck labs.   Duration of Discharge Encounter   Greater than 30 minutes including physician time.  Signed, Nada Boozer, NP 05/31/2019, 1:54 PM

## 2019-05-31 NOTE — Discharge Instructions (Signed)
Dysphasia 2 Diet   Physical therapy and OT  Do not stop Eliquis or Plavix.    Discharge Instructions:  1. You may shower, please wash incisions daily with soap and water and keep dry.  If you wish to cover wounds with dressing you may do so but please keep clean and change daily.  No tub baths or swimming until incisions have completely healed.  If your incisions become red or develop any drainage please call our office at [phone]  2. No Driving until cleared by Dr. Abran Duke office and you are no longer using narcotic pain medications  3. Fever of 101.5 for at least 24 hours with no source, please contact our office at [phone]  4. Activity- up as tolerated, please walk at least 3 times per day.  Avoid strenuous activity, no lifting, pushing, or pulling with your arms over 8-10 lbs for a minimum of 6 weeks  5. If any questions or concerns arise, please do not hesitate to contact our office at [phone]  Information on my medicine - ELIQUIS (apixaban)  This medication education was reviewed with me or my healthcare representative as part of my discharge preparation.  The pharmacist that spoke with me during my hospital stay was:    Why was Eliquis prescribed for you? Eliquis was prescribed for you to reduce the risk of a blood clot forming that can cause a stroke if you have a medical condition called atrial fibrillation (a type of irregular heartbeat).  What do You need to know about Eliquis ? Take your Eliquis TWICE DAILY - one tablet in the morning and one tablet in the evening with or without food. If you have difficulty swallowing the tablet whole please discuss with your pharmacist how to take the medication safely.  Take Eliquis exactly as prescribed by your doctor and DO NOT stop taking Eliquis without talking to the doctor who prescribed the medication.  Stopping may increase your risk of developing a stroke.  Refill your prescription before you run  out.  After discharge, you should have regular check-up appointments with your healthcare provider that is prescribing your Eliquis.  In the future your dose may need to be changed if your kidney function or weight changes by a significant amount or as you get older.  What do you do if you miss a dose? If you miss a dose, take it as soon as you remember on the same day and resume taking twice daily.  Do not take more than one dose of ELIQUIS at the same time to make up a missed dose.  Important Safety Information A possible side effect of Eliquis is bleeding. You should call your healthcare provider right away if you experience any of the following: ? Bleeding from an injury or your nose that does not stop. ? Unusual colored urine (red or dark brown) or unusual colored stools (red or black). ? Unusual bruising for unknown reasons. ? A serious fall or if you hit your head (even if there is no bleeding).  Some medicines may interact with Eliquis and might increase your risk of bleeding or clotting while on Eliquis. To help avoid this, consult your healthcare provider or pharmacist prior to using any new prescription or non-prescription medications, including herbals, vitamins, non-steroidal anti-inflammatory drugs (NSAIDs) and supplements.  This website has more information on Eliquis (apixaban): http://www.eliquis.com/eliquis/home

## 2019-05-31 NOTE — Progress Notes (Signed)
Physical Therapy Treatment Patient Details Name: Amy Chambers MRN: [id number] DOB: 02-17-1928 Today's Date: 05/31/2019    History of Present Illness 85 yo female with onset of hypoxia, hypercapnia and acute respiratory failure was admitted, may have been a medication reaction.  Cleared for PE, Covid (-).  PMHx:  thyroid disease, COPD, HTN, atherosclerosis carotids, bronchitis, underwent cath on 9/29 and developed post procedure hypotension found to have pericardial effusion with concern for tampanade due to coronary bleed.  Underwent emergent sternotomy, and evacuation of pericardial effusion.  Extubated 10/1.    PT Comments    Patient seen for mobility progression. Pt is making progress toward PT goals and tolerated increased activity this session. HR and SpO2 WNL. Overall min/mod A for mobility. Continue to progress as tolerated.    Follow Up Recommendations  SNF;Supervision for mobility/OOB     Equipment Recommendations  Other (comment)(TBD next venue)    Recommendations for Other Services       Precautions / Restrictions Precautions Precautions: Fall;Sternal    Mobility  Bed Mobility Overal bed mobility: Needs Assistance Bed Mobility: Sidelying to Sit   Sidelying to sit: Mod assist       General bed mobility comments: assist to elevate trunk into sitting  Transfers Overall transfer level: Needs assistance   Transfers: Sit to/from Stand Sit to Stand: Mod assist         General transfer comment: cues for hand placement; pt hugging pillow; assist to power up into standing  Ambulation/Gait Ambulation/Gait assistance: Min assist Gait Distance (Feet): 120 Feet Assistive device: Rolling walker (2 wheeled) Gait Pattern/deviations: Step-through pattern;Decreased stride length;Trunk flexed Gait velocity: decreased   General Gait Details: cues for upright posture and maintaining safe proximity to [employer] Mobility    Modified  Rankin (Stroke Patients Only)       Balance Overall balance assessment: Needs assistance Sitting-balance support: No upper extremity supported;Feet supported Sitting balance-Leahy Scale: Good     Standing balance support: During functional activity;Bilateral upper extremity supported Standing balance-Leahy Scale: Poor                              Cognition Arousal/Alertness: Awake/alert Behavior During Therapy: WFL for tasks assessed/performed Overall Cognitive Status: Within Functional Limits for tasks assessed                       Memory: Decreased recall of precautions         General Comments: needs cues to maintain sternal precautions during functional mobility      Exercises      General Comments General comments (skin integrity, edema, etc.): HR and SpO2 WNL      Pertinent Vitals/Pain Pain Assessment: Faces Faces Pain Scale: No hurt    Home Living                      Prior Function            PT Goals (current goals can now be found in the care plan section) Progress towards PT goals: Progressing toward goals    Frequency    Min 2X/week      PT Plan Current plan remains appropriate    Co-evaluation              AM-PAC PT "6 Clicks" Mobility   Outcome Measure  Help needed turning from your back to your side while in a flat bed without using bedrails?: A Little Help needed moving from lying on your back to sitting on the side of a flat bed without using bedrails?: A Lot Help needed moving to and from a bed to a chair (including a wheelchair)?: A Little Help needed standing up from a chair using your arms (e.g., wheelchair or bedside chair)?: A Little Help needed to walk in hospital room?: A Little Help needed climbing 3-5 steps with a railing? : A Lot 6 Click Score: 16    End of Session Equipment Utilized During Treatment: Gait belt Activity Tolerance: Patient tolerated treatment well Patient left:  with call bell/phone within reach;with family/visitor present;in chair Nurse Communication: Mobility status PT Visit Diagnosis: Other abnormalities of gait and mobility (R26.89);Muscle weakness (generalized) (M62.81)     Time: 1655-3748 PT Time Calculation (min) (ACUTE ONLY): 27 min  Charges:  $Gait Training: 23-37 mins                     Erline Levine, PTA Acute Rehabilitation Services Pager: [phone] Office: [phone]     Carolynne Edouard 05/31/2019, 11:12 AM

## 2019-05-31 NOTE — Progress Notes (Addendum)
Progress Note  Patient Name: Amy Chambers Date of Encounter: 05/31/2019  Primary Cardiologist: Reatha HarpsWesley T O'Neal, MD   Subjective   No pain or SOB, is tired after eating BK so resting.    Inpatient Medications    Scheduled Meds:  [START ON 06/01/2019] amiodarone  100 mg Oral Q T,Th,S,Su   amiodarone  200 mg Oral Q M,W,F   apixaban  2.5 mg Oral BID   atorvastatin  40 mg Oral q1800   clopidogrel  75 mg Oral Daily   feeding supplement (ENSURE ENLIVE)  237 mL Oral BID BM   feeding supplement (NEPRO CARB STEADY)  237 mL Oral TID BM   furosemide  20 mg Oral Daily   guaiFENesin  600 mg Oral BID   levothyroxine  75 mcg Oral Daily   loratadine  10 mg Oral Daily   mouth rinse  15 mL Mouth Rinse BID   mometasone-formoterol  2 puff Inhalation BID   multivitamin with minerals  1 tablet Oral Daily   pantoprazole  40 mg Oral BID   polyethylene glycol  17 g Oral Daily   sodium chloride flush  3 mL Intravenous Q12H   Continuous Infusions:  sodium chloride Stopped (05/17/19 2000)   PRN Meds: sodium chloride, acetaminophen, albuterol, bisacodyl, diphenhydrAMINE-zinc acetate, ondansetron **OR** ondansetron (ZOFRAN) IV, oxyCODONE, sodium chloride flush, traMADol, traZODone   Vital Signs    Vitals:   05/31/19 0037 05/31/19 0537 05/31/19 0543 05/31/19 0800  BP: (!) 110/43  (!) 120/53 (!) 153/59  Pulse: 63  (!) 58 62  Resp: 16  18 19   Temp: 98.5 F (36.9 C)  98 F (36.7 C) 97.9 F (36.6 C)  TempSrc: Oral  Oral Oral  SpO2: 96%  97% 98%  Weight:  55.3 kg    Height:        Intake/Output Summary (Last 24 hours) at 05/31/2019 0849 Last data filed at 05/31/2019 0700 Gross per 24 hour  Intake 680 ml  Output 175 ml  Net 505 ml   Last 3 Weights 05/31/2019 05/30/2019 05/29/2019  Weight (lbs) 121 lb 14.6 oz 114 lb 1.6 oz 125 lb  Weight (kg) 55.3 kg 51.755 kg 56.7 kg      Telemetry    SR no afib since yesterday - Personally Reviewed  ECG    No new - Personally  Reviewed  Physical Exam   GEN: No acute distress.   Neck: No JVD Cardiac: RRR, no murmurs, rubs, or gallops.  Respiratory: Clear to auscultation bilaterally. GI: Soft, nontender, non-distended  MS: No edema; No deformity. Neuro:  Nonfocal alert and oriented X 3 MAE, follows commands, has not heard any more voices Psych: Normal affect   Labs    High Sensitivity Troponin:   Recent Labs  Lab 05/10/19 1303 05/10/19 1527 05/10/19 1906 05/12/19 0653 05/12/19 0908  TROPONINIHS 44* 499* 975* 1,563* 1,288*      Chemistry Recent Labs  Lab 05/26/19 0803 05/27/19 0745 05/28/19 0402 05/29/19 0545  NA 137 136 136 133*  K 4.3 4.2 4.1 4.6  CL 97* 97* 97* 94*  CO2 30 30 27 30   GLUCOSE 113* 106* 81 80  BUN 15 18 19 20   CREATININE 0.80 0.80 0.85 0.91  CALCIUM 8.8* 8.4* 8.4* 8.4*  PROT 5.7* 5.1*  --  5.2*  ALBUMIN 2.9* 2.6*  --  2.7*  AST 38 29  --  26  ALT 115* 85*  --  61*  ALKPHOS 63 63  --  62  BILITOT 1.1 1.0  --  1.1  GFRNONAA >60 >60 >60 56*  GFRAA >60 >60 >60 >60  ANIONGAP 10 9 12 9      Hematology Recent Labs  Lab 05/26/19 0931 05/27/19 0745 05/28/19 0402  WBC 9.7 13.1* 9.9  RBC 3.54* 3.57* 3.61*  HGB 10.4* 10.8* 10.7*  HCT 34.2* 33.7* 34.5*  MCV 96.6 94.4 95.6  MCH 29.4 30.3 29.6  MCHC 30.4 32.0 31.0  RDW 17.8* 18.0* 18.5*  PLT 268 249 248    BNPNo results for input(s): BNP, PROBNP in the last 168 hours.   DDimer No results for input(s): DDIMER in the last 168 hours.   Radiology    No results found.  Cardiac Studies   Echo 05/16/19 IMPRESSIONS 1. Left ventricular ejection fraction, by visual estimation, is 55 to 60%. The left ventricle has normal function. Left ventricular septal wall thickness was mildly increased. There is mildly increased left ventricular hypertrophy. 2. Inferior basal hypokinesis but overal EF preserved. 3. Global right ventricle has normal systolic function.The right ventricular size is normal. No increase in right  ventricular wall thickness. 4. Left atrial size was normal. 5. Right atrial size was mildly dilated. 6. Moderate calcification of the mitral valve leaflet(s). 7. Moderate mitral annular calcification. 8. Moderate thickening of the mitral valve leaflet(s). 9. The mitral valve is normal in structure. No evidence of mitral valve regurgitation. No evidence of mitral stenosis. 10. The tricuspid valve is normal in structure. Tricuspid valve regurgitation is mild. 11. The aortic valve is tricuspid Aortic valve regurgitation was not visualized by color flow Doppler. Mild to moderate aortic valve sclerosis/calcification without any evidence of aortic stenosis. 12. The pulmonic valve was not well visualized. Pulmonic valve regurgitation was not assessed by color flow Doppler. 13. Post pericardial window with no tamponade or residual effuson. 14. The interatrial septum was not well visualized.  FINDINGS Left Ventricle: Left ventricular ejection fraction, by visual estimation, is 55 to 60%. The left ventricle has normal function. Left ventricular septal wall thickness was mildly increased. There is mildly increased left ventricular hypertrophy. Inferior basal hypokinesis but overal EF preserved.   Patient Profile     85 y.o. female from [place] with history of COPD, HTN, and GERD who was initially admitted with COPD exacerbation with hypoxic respiratory failure. It was unclear if there was perhaps a component of respiratory failure from allergic reaction to Augmentin. She was treated with steroids, antibiotics, and supplemental O2. As part of her workup she was also found to have NSTEMI with hsTroponin peak of 1563 and 2D echo with suspected ischemic cardiomyopathy with EF 45-50% and mild hypokinesis of the basal to mid inferoseptum/inferior wall concerning for RCA ischemia/infarction.   She underwent cath 05/14/19 with PTCA/DES to proximal RCA stenosis. The intervention was complicated by  perforation with tamponade and hemopericardium.   She was taken to the OR 9/29 for mediastinal pericardial washout. Post-op course was complicated by postoperative paroxysmal atrial fibrillation with intermittent post-conversion pauses up to 3.7 seconds. She was seen by EP, loaded with amiodarone, and metoprolol was reduced. She was not felt to require PPM.  Assessment & Plan    1. NSTEMI/CAD s/p PCI of the proximal RCA- - continue Plavix, no ASA 2nd Eliquis - BB held due to low BP, restarted at decreased dose - BP improved, but HR low - have stopped BB  2. Acute combined CHF- - EF previously decreased, normal by echo 10/01 - CHF improved, now on po Lasix 20 mg -  I/O netneg 1572since admit - wt 52.7 kg>>64.4 kg>>56.7kg -> 51.8 Kg -> 55.3 kg - 10/09 CXR w/ small layering effusions, emphysema - cardiopericardial silhouette enlarged, similar to prior CT  3. AECOPD with acute hypoxic respiratory failure - - s/p 5 days steroids and ABX - on Dulera & prn albuterol (per CCM recs) -She was not on home O2 prior to admission -Need to see how she does off O2, stable on RA   4. Paroxymal atrial fibrillation with SSS -has had on/off since surgery - post-termination pauses up to 3.7 sec - seen by EP, amio loaded but decreased to 200 mg qd on 10/09 due to nausea now alt 100 mg /200 mg - continue Eliquis 2.5 mg bid - now with bradycardiato 30s, decreased amio yesterday and now with break through a fib with HR 100-105  we stopped BB  5. Acute blood loss anemia/hemopericardium 2/2 coronary perforation - - s/p surgery 09/30 w/ 250 cc blood evacuated - chest tube out 10/02 - H&H are stable -Last echo was 10/1 and showed no effusion, MD advise on when this should be repeated  6. Thrombocytopenia- - felt 2nd surgery and acute illness - nadir 78, resolved  7. Deconditioning - - Plan is for SNFPerhaps Friday  8. Transaminitis: -Mild elevation, improving -Ultrasound  unremarkable -Likely related to the stress of acute illness -Since on amiodarone, continue to follow  9. Rash:resolved No complaints at this time  10. Dysphagia -Speech has evaluated, it may be that her reflux symptoms have increased over the last few days and the dysphagia may be esophageal in origin. -SLP will continue to followon dysphasia 2 diet  11. Bradycardia with junct rhythm, after conversion pause, on amio 100 mg dail and torpol XL 12.5 mg daily.  Will stop BB and alt amio 100/200 QOD.   12 Confusion  possible sundowning,     .   13. Constipation will give suppository  14.  TSH 7.685 on amiodarone  15.  Hematuria - neg nitrites many bacteria  On plavix and eliquis monitor       For questions or updates, please contact [facility] Please consult [email or web address] for contact info under       Signed, Nada Boozer, NP  05/31/2019, 8:49 AM     Patient seen and examined. Agree with assessment and plan. Feels well today. More energetic. Maintaining sinus rhythm at 68.  Tolerating amiodarone now at a dose of 100 mg alternating with 200 mg every other day.  No longer on beta-blocker therapy.  Patient has underlying sick sinus syndrome with prolonged sinus node recovery time.  She appears stable to be discharged today and transfer to skilled nursing facility for additional rehabilitation.   Lennette Bihari, MD, [facility] 05/31/2019 11:24 AM

## 2019-06-04 ENCOUNTER — Non-Acute Institutional Stay (SKILLED_NURSING_FACILITY): Payer: Medicare Other | Admitting: Adult Health

## 2019-06-04 ENCOUNTER — Encounter: Payer: Self-pay | Admitting: Adult Health

## 2019-06-04 DIAGNOSIS — I214 Non-ST elevation (NSTEMI) myocardial infarction: Secondary | ICD-10-CM

## 2019-06-04 DIAGNOSIS — E038 Other specified hypothyroidism: Secondary | ICD-10-CM

## 2019-06-04 DIAGNOSIS — J410 Simple chronic bronchitis: Secondary | ICD-10-CM | POA: Diagnosis not present

## 2019-06-04 DIAGNOSIS — J9601 Acute respiratory failure with hypoxia: Secondary | ICD-10-CM

## 2019-06-04 DIAGNOSIS — I480 Paroxysmal atrial fibrillation: Secondary | ICD-10-CM

## 2019-06-04 DIAGNOSIS — I230 Hemopericardium as current complication following acute myocardial infarction: Secondary | ICD-10-CM | POA: Diagnosis not present

## 2019-06-04 DIAGNOSIS — I5041 Acute combined systolic (congestive) and diastolic (congestive) heart failure: Secondary | ICD-10-CM

## 2019-06-04 DIAGNOSIS — K219 Gastro-esophageal reflux disease without esophagitis: Secondary | ICD-10-CM

## 2019-06-04 DIAGNOSIS — D62 Acute posthemorrhagic anemia: Secondary | ICD-10-CM

## 2019-06-04 DIAGNOSIS — K5909 Other constipation: Secondary | ICD-10-CM

## 2019-06-04 DIAGNOSIS — E782 Mixed hyperlipidemia: Secondary | ICD-10-CM

## 2019-06-04 DIAGNOSIS — E43 Unspecified severe protein-calorie malnutrition: Secondary | ICD-10-CM

## 2019-06-04 DIAGNOSIS — R1311 Dysphagia, oral phase: Secondary | ICD-10-CM

## 2019-06-04 NOTE — Progress Notes (Signed)
Location:    [facility] Room Number: 156/W Place of Service:  SNF (31)   CODE STATUS: DNR  Allergies  Allergen Reactions  . Crestor [Rosuvastatin Calcium] Other (See Comments)    Cramping/pain in lower extremeties  . Niaspan [Niacin Er] Other (See Comments)    Cramping/pain in lower extremeties    Chief Complaint  Patient presents with  . Hospitalization Follow-up    Hospitalization Follow Up    HPI:  She is a 85 year old woman who has been hospitalized from 05-10-19 through 05-31-19. She had developed significant shortness of breath at rest. Her ct of the chest demonstrated calcifications and concern for ischemic event. She was admitted to NSTEMI. She had a cardiac cath with stent. Had a hemopericardium which required open heart surgical repair. She denies any uncontrolled pain; no shortness of breath. No changes in her appetite. She does get tired easily. She is here for short term rehab with her goal to return back home. She will continue to be followed for her chronic illnesses including: gerd; afib; chf.   Past Medical History:  Diagnosis Date  . Cataract   . COPD (chronic obstructive pulmonary disease) (HCC)   . Dysphagia 05/31/2019  . GERD (gastroesophageal reflux disease)   . Hypertension   . Osteopenia   . Thyroid disease     Past Surgical History:  Procedure Laterality Date  . CATARACT EXTRACTION, BILATERAL    . CORONARY STENT INTERVENTION N/A 05/14/2019   Procedure: CORONARY STENT INTERVENTION;  Surgeon: Lennette Bihari, MD;  Location: [facility] INVASIVE CV LAB;  Service: Cardiovascular;  Laterality: N/A;  . LEFT HEART CATH AND CORONARY ANGIOGRAPHY N/A 05/14/2019   Procedure: LEFT HEART CATH AND CORONARY ANGIOGRAPHY;  Surgeon: Marykay Lex, MD;  Location: [facility] INVASIVE CV LAB;  Service: Cardiovascular;  Laterality: N/A;  . LEFT HEART CATH AND CORONARY ANGIOGRAPHY N/A 05/14/2019   Procedure: LEFT HEART CATH AND CORONARY ANGIOGRAPHY;  Surgeon:  Lennette Bihari, MD;  Location: [facility] INVASIVE CV LAB;  Service: Cardiovascular;  Laterality: N/A;  . MEDIASTINAL EXPLORATION N/A 05/14/2019   Procedure: Mediastinal Exploration with Washout;  Surgeon: Corliss Skains, MD;  Location: [facility] OR;  Service: Open Heart Surgery;  Laterality: N/A;    Social History   Socioeconomic History  . Marital status: Married    Spouse name: Not on file  . Number of children: Not on file  . Years of education: Not on file  . Highest education level: Not on file  Occupational History  . Not on file  Social Needs  . Financial resource strain: Not on file  . Food insecurity    Worry: Not on file    Inability: Not on file  . Transportation needs    Medical: Not on file    Non-medical: Not on file  Tobacco Use  . Smoking status: Former Smoker    Quit date: 08/15/1994    Years since quitting: 24.8  . Smokeless tobacco: Former Neurosurgeon    Types: Chew    Quit date: 08/15/1998  Substance and Sexual Activity  . Alcohol use: No  . Drug use: No  . Sexual activity: Not Currently  Lifestyle  . Physical activity    Days per week: Not on file    Minutes per session: Not on file  . Stress: Not on file  Relationships  . Social Musician on phone: Not on file    Gets together: Not on file  Attends religious service: Not on file    Active member of club or organization: Not on file    Attends meetings of clubs or organizations: Not on file    Relationship status: Not on file  . Intimate partner violence    Fear of current or ex partner: Not on file    Emotionally abused: Not on file    Physically abused: Not on file    Forced sexual activity: Not on file  Other Topics Concern  . Not on file  Social History Narrative  . Not on file   Family History  Problem Relation Age of Onset  . Diabetes Mother   . Cancer Brother   . Tuberculosis Father   . Early death Sister   . Early death Brother   . COPD Brother   . Emphysema Brother   . Early  death Sister   . COPD Sister   . Emphysema Sister   . Aneurysm Sister   . Stroke Sister   . Cancer Sister   . Hypertension Son   . Hypertension Daughter   . Fibromyalgia Daughter   . Hypertension Daughter       VITAL SIGNS BP 111/67   Pulse 74   Temp (!) 97.5 F (36.4 C) (Oral)   Resp 20   Ht 4\' 11"  (1.499 m)   Wt 118 lb 4.8 oz (53.7 kg)   BMI 23.89 kg/m   Outpatient Encounter Medications as of 06/04/2019  Medication Sig  . acetaminophen (TYLENOL) 325 MG tablet Take 2 tablets (650 mg total) by mouth every 6 (six) hours as needed for mild pain or headache.  Marland Kitchen. amiodarone (PACERONE) 100 MG tablet Take 1 tablet (100 mg total) by mouth every Tuesday, Thursday, Saturday, and Sunday.  Marland Kitchen. amiodarone (PACERONE) 200 MG tablet Take 1 tablet (200 mg total) by mouth every Monday, Wednesday, and Friday.  Marland Kitchen. apixaban (ELIQUIS) 2.5 MG TABS tablet Take 1 tablet (2.5 mg total) by mouth 2 (two) times daily.  Marland Kitchen. atorvastatin (LIPITOR) 40 MG tablet Take 1 tablet (40 mg total) by mouth daily at 6 PM.  . bisacodyl (DULCOLAX) 10 MG suppository Place 1 suppository (10 mg total) rectally daily as needed for moderate constipation.  . calcium-vitamin D 250-100 MG-UNIT per tablet Take 1 tablet by mouth 2 (two) times daily.   . clopidogrel (PLAVIX) 75 MG tablet Take 1 tablet (75 mg total) by mouth daily.  . diphenhydrAMINE-zinc acetate (BENADRYL) cream Apply topically 2 (two) times daily as needed for itching.  . feeding supplement, ENSURE ENLIVE, (ENSURE ENLIVE) LIQD Take 237 mLs by mouth 3 (three) times daily between meals.  . furosemide (LASIX) 20 MG tablet Take 1 tablet (20 mg total) by mouth daily.  Marland Kitchen. guaiFENesin (MUCINEX) 600 MG 12 hr tablet Take 1 tablet (600 mg total) by mouth 2 (two) times daily.  Marland Kitchen. levothyroxine (SYNTHROID) 75 MCG tablet Take 1 tablet (75 mcg total) by mouth daily.  Marland Kitchen. loratadine (CLARITIN) 10 MG tablet Take 1 tablet (10 mg total) by mouth daily.  . mometasone-formoterol (DULERA)  200-5 MCG/ACT AERO Inhale 2 puffs into the lungs 2 (two) times daily.  . Multiple Vitamin (MULTIVITAMIN WITH MINERALS) TABS tablet Take 1 tablet by mouth daily.  . NON FORMULARY Diet change: Dysphagia 3, thin liquids; will allow cereal for breakfast  . pantoprazole (PROTONIX) 40 MG tablet Take 1 tablet (40 mg total) by mouth 2 (two) times daily.  . polyethylene glycol (MIRALAX / GLYCOLAX) packet Take 17 g by mouth  daily.  . Vitamin D, Cholecalciferol, 25 MCG (1000 UT) TABS Take 1 tablet by mouth daily.  . [DISCONTINUED] feeding supplement, ENSURE ENLIVE, (ENSURE ENLIVE) LIQD Take 237 mLs by mouth 2 (two) times daily between meals. (Patient taking differently: Take 237 mLs by mouth 3 (three) times daily between meals. )  . [DISCONTINUED] Nutritional Supplements (FEEDING SUPPLEMENT, NEPRO CARB STEADY,) LIQD Take 237 mLs by mouth 3 (three) times daily between meals.  . [DISCONTINUED] VITAMIN D PO Take 1 capsule by mouth daily.   No facility-administered encounter medications on file as of 06/04/2019.      SIGNIFICANT DIAGNOSTIC EXAMS  TODAY;   05-10-19: ct angio of chest:  1. Negative for acute PE or thoracic aortic dissection. 2. Coronary and Aortic Atherosclerosis   05-10-19: ct of soft tissue of neck:  No acute abnormality Atherosclerotic calcification of the carotid arteries bilaterally. Arterial stricture occlusion not excluded on this study without intravenous contrast Airway intact.   05-14-19: ct angio of chest abdomen and pelvis: 1. No CT evidence of aortic dissection or aneurysm.  No hematoma. 2. High attenuating pericardial effusion most consistent with hemopericardium. 3. Coronary vascular calcification. 4. Colonic diverticulosis. No bowel obstruction or active inflammation. Normal appendix. 5. Hyperenhancement of the renal parenchyma concerning for a degree of renal dysfunction. Clinical correlation is recommended. 6. Aortic Atherosclerosis and Emphysema     05-14-19: ct  of abdomen and pelvis:  1. No acute intra-abdominal or pelvic pathology. No intraperitoneal or retroperitoneal hematoma. 2. Partially visualized hemopericardium. Clinical correlation is recommended. 3. Small pocket of air within the upper IVC, iatrogenic. 4. Sigmoid diverticulosis. 5. Advanced aortic Atherosclerosis   05-14-19: cardiac catheterization Coronary artery obstructive disease with mild coronary calcification.  The LAD has 20% mild stenosis in its proximal to mid segment and dips intramyocardially in the mid LAD without systolic muscle bridging; there is mild 20% proximal circumflex stenosis with 40% narrowing and a small first marginal branch ostially.  RCA is a very large dominant vessel with coronary calcification and sharp angulation proximally and in its mid segment.  There is 70 -80% stenosis proximal to mid segment with TIMI-3 flow and mild 20% distal RCA stenoses.   LV dysfunction with EF estimate at 50% with mid to basal inferior hypocontractility.  LVEDP elevated at 28 mm.   Very difficult but successful percutaneous coronary intervention involving the calcified sharp angled proximal to mid RCA with ultimate PTCA, and tandem stenting necessitating Guideliner support ultimate insertion of a 3.5 x 20 mm Synergy and 3.0 x 12 mm Synergy stents postdilated to 3.6 to 3.5 mm with the region being reduced to 0%.   05-16-19: 2-d echo:   1. Left ventricular ejection fraction, by visual estimation, is 55 to 60%. The left ventricle has normal function. Left ventricular septal wall thickness was mildly increased. There is mildly increased left ventricular hypertrophy.  2. Inferior basal hypokinesis but overal EF preserved.  3. Global right ventricle has normal systolic function.The right ventricular size is normal. No increase in right ventricular wall thickness.  4. Left atrial size was normal.  5. Right atrial size was mildly dilated.  6. Moderate calcification of the mitral valve  leaflet(s).  7. Moderate mitral annular calcification.  8. Moderate thickening of the mitral valve leaflet(s).  9. The mitral valve is normal in structure. No evidence of mitral valve regurgitation. No evidence of mitral stenosis. 10. The tricuspid valve is normal in structure. Tricuspid valve regurgitation is mild. 11. The aortic valve is tricuspid Aortic  valve regurgitation was not visualized by color flow Doppler. Mild to moderate aortic valve sclerosis/calcification without any evidence of aortic stenosis. 12. The pulmonic valve was not well visualized. Pulmonic valve regurgitation was not assessed by color flow Doppler. 13. Post pericardial window with no tamponade or residual effuson. 14. The interatrial septum was not well visualized.  05-24-19: chest x-ray:  Small layering pleural effusions. Enlarged cardiopericardial silhouette compatible with pericardial effusion as seen on prior CT. Emphysema.  05-24-19: abdominal ultrasound 1. No sonographic finding to explain the patient's leukocytosis and elevated LFTs. 2. Small amount of biliary sludge. No evidence of acute gallbladder inflammation. 3.  Hepatic cysts.  LABS REVIEWED TODAY  05-10-19: wbc 22.2; hgb 13.9; hct 46.3; mcv 98.7 plt 320; glucose 195; bun 27; creat 0.78; k+ 4.3; na++ 138; ca 8.6; liver normal albumin 4.0; blood culture: no growth BNP 211.0 05-11-19: wbc 10.3; hgb 13.4; hct 41.5; mcv 94.3; plt 169; glucose 141; bun 24; creat 0.89; k+ 4.7; na++ 141; ca 9.1; BNP 1643.0 05-15-19: wbc 20.1; hgb 12.2; hct 37.6; mcv 90.2 ;plt 81; glucose 175 bun 28; creat 0.89; k+ 4.5; na++ 140; ca 6.5  05-19-19: wbc 10.'6 hgb 9.5; hct 29.5; mcv 92.5; plt 121; glucose 121; bun 21; creat 0.75; k+ 3.5; na++ 139; ca 7.9 05-24-19: wbc 12.1; hgb 10.9'; h ct 34.0; mcv 95.5; plt 268; glucose 106; bun 19; creat 0.84; k+ 3.6; na++ 138; ca 8.4; ast 52; alt 155; total bili 0.3; albumin 2.5 tsh 4.306 urine culture: multiple 05-27-19: wbc 13.1; hgb 10.8; hct  33.7; mcv 94.4 plt 249; glucose 106; bun 18; creat 0.80; k+ 4.2; na++ 136; ca 8.4; ast 29; alt 85; total bili 1.0; albumin 2.6 pre-albumin 17.7 05-28-19: free T3: 1.1; free T4: 1.21  05-31-19: tsh 7.685   Review of Systems  Constitutional: Negative for malaise/fatigue.  Respiratory: Negative for cough and shortness of breath.   Cardiovascular: Negative for chest pain, palpitations and leg swelling.  Gastrointestinal: Negative for abdominal pain, constipation and heartburn.  Musculoskeletal: Negative for back pain, joint pain and myalgias.  Skin: Negative.   Neurological: Negative for dizziness.  Psychiatric/Behavioral: The patient is not nervous/anxious.     Physical Exam Constitutional:      General: She is not in acute distress.    Appearance: She is underweight. She is not diaphoretic.  Eyes:     Comments: History of bilateral cataract removal  Neck:     Musculoskeletal: Neck supple.     Thyroid: No thyromegaly.  Cardiovascular:     Rate and Rhythm: Normal rate and regular rhythm.     Pulses: Normal pulses.     Heart sounds: Normal heart sounds.     Comments: Status post cardiac stent Pulmonary:     Effort: Pulmonary effort is normal. No respiratory distress.     Breath sounds: Normal breath sounds.  Abdominal:     General: Bowel sounds are normal. There is no distension.     Palpations: Abdomen is soft.     Tenderness: There is no abdominal tenderness.  Musculoskeletal:     Right lower leg: No edema.     Left lower leg: No edema.     Comments: Is able to move all extremities   Lymphadenopathy:     Cervical: No cervical adenopathy.  Skin:    General: Skin is warm and dry.     Comments: Chest incision line without signs of infection   Neurological:     Mental Status: She is alert and  oriented to person, place, and time.  Psychiatric:        Mood and Affect: Mood normal.       ASSESSMENT/ PLAN:  TODAY;   1. NSTEMI (non st elevation myocardial infarction)  hemopericardium after myocardial infarction: is status post evacuation of hemopericardium: is stable will continue plavix 75 mg daily will continue to monitor her status.   2. 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   3. Paroxysmal atrial fibrillation: heart rate is stable is on alternating doses of amiodarone 100/200 mg daily for rate control; is on long term anticoagulation therapy eliquis 2.5 mg twice daily   4. Simple chronic bronchitis/acute respiratory failure with hypoxia: is stable will continue dulera 100/5 mcg 2 puffs twice daily ; mucinex 600 mg twice daily claritin 10 mg daily   5. Gastroesophageal reflux disease without esophagitis: is stable will continue protonix 40 mg twice daily   6. Oral phase dysphagia: is stable no signs of aspiration present; is on thin liquids  7. Other specified hypothyroidism: is stable tsh 7.685 will continue synthroid 75 mcg daily   8. Mixed hyperlipidemia: is stable will continue lipitor 40 mg daily   9. Acute blood loss anemia transfused 3 units RBC: is stable hgb 10.8 will monitor  10. Chronic constipation: is stable will continue miralax daily   11. Protein calorie malnutrition severe: is stable pre-albumin 17.7 albumin 2.6 will continue supplements as directed.        MD is aware of resident's narcotic use and is in agreement with current plan of care. We will attempt to wean resident as appropriate.  Synthia Innocent NP [facility]  Contact [phone] Monday through Friday 8am- 5pm  After hours call [phone]

## 2019-06-05 ENCOUNTER — Other Ambulatory Visit: Payer: Self-pay | Admitting: Adult Health

## 2019-06-05 ENCOUNTER — Non-Acute Institutional Stay (SKILLED_NURSING_FACILITY): Payer: Medicare Other | Admitting: Internal Medicine

## 2019-06-05 ENCOUNTER — Encounter: Payer: Self-pay | Admitting: Internal Medicine

## 2019-06-05 DIAGNOSIS — E782 Mixed hyperlipidemia: Secondary | ICD-10-CM

## 2019-06-05 DIAGNOSIS — I314 Cardiac tamponade: Secondary | ICD-10-CM | POA: Diagnosis not present

## 2019-06-05 DIAGNOSIS — I230 Hemopericardium as current complication following acute myocardial infarction: Secondary | ICD-10-CM | POA: Diagnosis not present

## 2019-06-05 DIAGNOSIS — I214 Non-ST elevation (NSTEMI) myocardial infarction: Secondary | ICD-10-CM

## 2019-06-05 DIAGNOSIS — J441 Chronic obstructive pulmonary disease with (acute) exacerbation: Secondary | ICD-10-CM

## 2019-06-05 DIAGNOSIS — D62 Acute posthemorrhagic anemia: Secondary | ICD-10-CM

## 2019-06-05 DIAGNOSIS — E039 Hypothyroidism, unspecified: Secondary | ICD-10-CM

## 2019-06-05 DIAGNOSIS — I480 Paroxysmal atrial fibrillation: Secondary | ICD-10-CM | POA: Diagnosis not present

## 2019-06-05 DIAGNOSIS — I2510 Atherosclerotic heart disease of native coronary artery without angina pectoris: Secondary | ICD-10-CM

## 2019-06-05 DIAGNOSIS — Z9861 Coronary angioplasty status: Secondary | ICD-10-CM

## 2019-06-05 DIAGNOSIS — K219 Gastro-esophageal reflux disease without esophagitis: Secondary | ICD-10-CM

## 2019-06-05 DIAGNOSIS — J9601 Acute respiratory failure with hypoxia: Secondary | ICD-10-CM

## 2019-06-05 NOTE — Progress Notes (Signed)
Location:  [facility] Room Number: 156-W Place of Service:  SNF (31)  Bennie Pierini, [name]  Patient Care Team: Bennie Pierini, [name] as PCP - General (Nurse Practitioner) Antoine Poche, MD as PCP - Cardiology (Cardiology)  Extended Emergency Contact Information Primary Emergency Contact: Amos,[name]          [place] [phone] [place] Home Phone: [phone] Mobile Phone: [phone] Relation: Daughter Secondary Emergency Contact: Martelle,Gary Address: [address]          [place], [place] [address] [place] Home Phone: [phone] Relation: Son    Allergies: Crestor [rosuvastatin calcium] and Niaspan [niacin er]  Chief Complaint  Patient presents with   New Admit To SNF    New admission to [facility]    HPI: Patient is an 85 y.o. female with COPD who presented to [facility] after becoming significantly short of breath while at rest.  Patient had taken an antibiotic for ear pain.  At any pain hospital her troponin was 975.  Patient was treated steroids epinephrine and transferred to [facility] from [facility].  CT chest with coronary calcifications and concern for ischemic event.  Patient was admitted to [facility] from 9/25-10/16 where she was found to have an NSTEMI and hemopericardium requiring cardiac cath and then later open heart surgery to evacuate hemopericardium.  Hospital course was complicated by atrial fibrillation which was treated with IV amiodarone.  Patient is admitted to skilled nursing facility for OT/PT.  While at skilled nursing facility patient will be followed for COPD treated with an Dulera, hypothyroidism treated with replacement, and GERD treated with Protonix.  Past Medical History:  Diagnosis Date   Cataract    COPD (chronic obstructive pulmonary disease) (HCC)    Dysphagia 05/31/2019   GERD (gastroesophageal reflux disease)    Hypertension    Osteopenia     Thyroid disease     Past Surgical History:  Procedure Laterality Date   CATARACT EXTRACTION, BILATERAL     CORONARY STENT INTERVENTION N/A 05/14/2019   Procedure: CORONARY STENT INTERVENTION;  Surgeon: Lennette Bihari, MD;  Location: [facility] INVASIVE CV LAB;  Service: Cardiovascular;  Laterality: N/A;   LEFT HEART CATH AND CORONARY ANGIOGRAPHY N/A 05/14/2019   Procedure: LEFT HEART CATH AND CORONARY ANGIOGRAPHY;  Surgeon: Marykay Lex, MD;  Location: [facility] INVASIVE CV LAB;  Service: Cardiovascular;  Laterality: N/A;   LEFT HEART CATH AND CORONARY ANGIOGRAPHY N/A 05/14/2019   Procedure: LEFT HEART CATH AND CORONARY ANGIOGRAPHY;  Surgeon: Lennette Bihari, MD;  Location: [facility] INVASIVE CV LAB;  Service: Cardiovascular;  Laterality: N/A;   MEDIASTINAL EXPLORATION N/A 05/14/2019   Procedure: Mediastinal Exploration with Washout;  Surgeon: Corliss Skains, MD;  Location: [facility] OR;  Service: Open Heart Surgery;  Laterality: N/A;    Allergies as of 06/05/2019      Reactions   Crestor [rosuvastatin Calcium] Other (See Comments)   Cramping/pain in lower extremeties   Niaspan [niacin Er] Other (See Comments)   Cramping/pain in lower extremeties      Medication List    Notice   This visit is during an admission. Changes to the med list made in this visit will be reflected in the After Visit Summary of the admission.    Current Outpatient Medications on File Prior to Visit  Medication Sig Dispense Refill   acetaminophen (TYLENOL) 325 MG tablet Take 2 tablets (650 mg total) by mouth every 6 (six) hours as  needed for mild pain or headache.     amiodarone (PACERONE) 100 MG tablet Take 1 tablet (100 mg total) by mouth every Tuesday, Thursday, Saturday, and Sunday.     amiodarone (PACERONE) 200 MG tablet Take 1 tablet (200 mg total) by mouth every Monday, Wednesday, and Friday.     apixaban (ELIQUIS) 2.5 MG TABS tablet Take 1 tablet (2.5 mg total) by mouth 2 (two) times daily. 60 tablet     atorvastatin (LIPITOR) 40 MG tablet Take 1 tablet (40 mg total) by mouth daily at 6 PM.     bisacodyl (DULCOLAX) 10 MG suppository Place 1 suppository (10 mg total) rectally daily as needed for moderate constipation. 12 suppository 0   calcium-vitamin D 250-100 MG-UNIT per tablet Take 1 tablet by mouth 2 (two) times daily.      clopidogrel (PLAVIX) 75 MG tablet Take 1 tablet (75 mg total) by mouth daily.     diphenhydrAMINE-zinc acetate (BENADRYL) cream Apply topically 2 (two) times daily as needed for itching. 28.4 g 0   feeding supplement, ENSURE ENLIVE, (ENSURE ENLIVE) LIQD Take 237 mLs by mouth 3 (three) times daily between meals.     furosemide (LASIX) 20 MG tablet Take 1 tablet (20 mg total) by mouth daily. 30 tablet    guaiFENesin (MUCINEX) 600 MG 12 hr tablet Take 1 tablet (600 mg total) by mouth 2 (two) times daily.     levothyroxine (SYNTHROID) 75 MCG tablet Take 1 tablet (75 mcg total) by mouth daily. 90 tablet 1   loratadine (CLARITIN) 10 MG tablet Take 1 tablet (10 mg total) by mouth daily.     mometasone-formoterol (DULERA) 200-5 MCG/ACT AERO Inhale 2 puffs into the lungs 2 (two) times daily.     Multiple Vitamin (MULTIVITAMIN WITH MINERALS) TABS tablet Take 1 tablet by mouth daily.     NON FORMULARY Diet change: Dysphagia 3, thin liquids; will allow cereal for breakfast     pantoprazole (PROTONIX) 40 MG tablet Take 1 tablet (40 mg total) by mouth 2 (two) times daily.     polyethylene glycol (MIRALAX / GLYCOLAX) packet Take 17 g by mouth daily.     Vitamin D, Cholecalciferol, 25 MCG (1000 UT) TABS Take 1 tablet by mouth daily.     No current facility-administered medications on file prior to visit.      No orders of the defined types were placed in this encounter.   Immunization History  Administered Date(s) Administered   Fluad Quad(high Dose 65+) 05/17/2019   Influenza Whole 05/13/2010   Influenza, High Dose Seasonal PF 06/04/2015, 05/19/2016, 06/06/2017,  05/24/2018   Influenza,inj,Quad PF,6+ Mos 06/07/2013, 06/16/2014   Pneumococcal Conjugate-13 06/07/2013   Pneumococcal Polysaccharide-23 04/15/2010, 05/17/2019   Td 08/15/2001   Tdap 01/30/2012    Social History   Tobacco Use   Smoking status: Former Smoker    Quit date: 08/15/1994    Years since quitting: 24.8   Smokeless tobacco: Former NeurosurgeonUser    Types: Chew    Quit date: 08/15/1998  Substance Use Topics   Alcohol use: No    Review of Systems  DATA OBTAINED: from patient, nurse GENERAL:  no fevers, fatigue, appetite changes SKIN: No itching, rash HEENT: No complaint RESPIRATORY: No cough, wheezing, SOB CARDIAC: No chest pain, palpitations, lower extremity edema  GI: No abdominal pain, No N/V/D or constipation, No heartburn or reflux  GU: No dysuria, frequency or urgency, or incontinence  MUSCULOSKELETAL: No unrelieved bone/joint pain NEUROLOGIC: No headache, dizziness  PSYCHIATRIC: No  overt anxiety or sadness  Vitals:   06/05/19 1620  BP: 122/64  Pulse: 67  Resp: 20  Temp: (!) 97 F (36.1 C)   Body mass index is 23.89 kg/m. Physical Exam  GENERAL APPEARANCE: Alert, conversant, No acute distress  SKIN: Midline scar chest scar HEENT: Unremarkable RESPIRATORY: Breathing is even, unlabored. Lung sounds are clear   CARDIOVASCULAR: Heart RRR no murmurs, rubs or gallops. No peripheral edema  GASTROINTESTINAL: Abdomen is soft, non-tender, not distended w/ normal bowel sounds.  GENITOURINARY: Bladder non tender, not distended  MUSCULOSKELETAL: No abnormal joints or musculature NEUROLOGIC: Cranial nerves 2-12 grossly intact. Moves all extremities PSYCHIATRIC: Mood and affect appropriate to situation, no behavioral issues  Patient Active Problem List   Diagnosis Date Noted   Acute blood loss anemia, transfusesd 3 units PRBC 05/31/2019   Acute combined systolic and diastolic HF (heart failure) (HCC) EF 45%  05/31/2019   Anticoagulation adequate on Eliquis for  PAF  05/31/2019   Physical deconditioning due to acute illness 05/31/2019   Dysphagia 05/31/2019   DNR (do not resuscitate) 05/31/2019   Sick sinus syndrome (HCC)    Acute respiratory failure with hypoxia (HCC)    COPD with acute exacerbation (HCC)    Status post coronary artery stent placement    PAF (paroxysmal atrial fibrillation) (HCC)    Hemopericardium    Pericardial tamponade    CAD S/P percutaneous coronary angioplasty    Acute respiratory failure with hypoxia and hypercapnia (HCC) 05/10/2019   NSTEMI (non-ST elevated myocardial infarction) (HCC) 05/10/2019   Simple chronic bronchitis (HCC) 05/24/2018   HTN (hypertension) 12/01/2010   Hyperlipemia 12/01/2010   Hypothyroid 12/01/2010   Osteopenia 12/01/2010   GERD (gastroesophageal reflux disease) 12/01/2010    CMP     Component Value Date/Time   NA 133 (L) 05/29/2019 0545   NA 138 08/30/2018 1045   K 4.6 05/29/2019 0545   CL 94 (L) 05/29/2019 0545   CO2 30 05/29/2019 0545   GLUCOSE 80 05/29/2019 0545   BUN 20 05/29/2019 0545   BUN 22 08/30/2018 1045   CREATININE 0.91 05/29/2019 0545   CREATININE 0.83 02/04/2013 1053   CALCIUM 8.4 (L) 05/29/2019 0545   PROT 5.2 (L) 05/29/2019 0545   PROT 6.8 08/30/2018 1045   ALBUMIN 2.7 (L) 05/29/2019 0545   ALBUMIN 4.3 08/30/2018 1045   AST 26 05/29/2019 0545   ALT 61 (H) 05/29/2019 0545   ALKPHOS 62 05/29/2019 0545   BILITOT 1.1 05/29/2019 0545   BILITOT 0.3 08/30/2018 1045   GFRNONAA 56 (L) 05/29/2019 0545   GFRNONAA 66 02/04/2013 1053   GFRAA >60 05/29/2019 0545   GFRAA 76 02/04/2013 1053   Recent Labs    05/27/19 0745 05/28/19 0402 05/29/19 0545  NA 136 136 133*  K 4.2 4.1 4.6  CL 97* 97* 94*  CO2 GLUCOSE 106* 81 80  BUN CREATININE 0.80 0.85 0.91  CALCIUM 8.4* 8.4* 8.4*   Recent Labs    05/26/19 0803 05/27/19 0745 05/29/19 0545  AST 38 29 26  ALT 115* 85* 61*  ALKPHOS 63 63 62  BILITOT 1.1 1.0 1.1  PROT 5.7*  5.1* 5.2*  ALBUMIN 2.9* 2.6* 2.7*   Recent Labs    05/10/19 1303  05/26/19 0931 05/27/19 0745 05/28/19 0402  WBC 22.2*   < > 9.7 13.1* 9.9  NEUTROABS 12.6*  --   --   --   --   HGB 13.9   < >  10.4* 10.8* 10.7*  HCT 46.3*   < > 34.2* 33.7* 34.5*  MCV 98.7   < > 96.6 94.4 95.6  PLT 320   < > 268 249 248   < > = values in this interval not displayed.   Recent Labs    08/30/18 1045  CHOL 204*  LDLCALC 130*  TRIG 140   No results found for: [facility] Lab Results  Component Value Date   TSH 7.685 (H) 05/31/2019   No results found for: [facility] Lab Results  Component Value Date   CHOL 204 (H) 08/30/2018   HDL 46 08/30/2018   LDLCALC 130 (H) 08/30/2018   TRIG 140 08/30/2018   CHOLHDL 4.4 08/30/2018    Significant Diagnostic Results in last 30 days:  Ct Abdomen Pelvis Wo Contrast  Result Date: 05/14/2019 CLINICAL DATA:  85 year old female with back pain and hypertension. Status post catheterization. Rule out retroperitoneal hematoma. EXAM: CT ABDOMEN AND PELVIS WITHOUT CONTRAST TECHNIQUE: Multidetector CT imaging of the abdomen and pelvis was performed following the standard protocol without IV contrast. COMPARISON:  Chest CT dated 05/10/2019 FINDINGS: Evaluation of this exam is limited in the absence of intravenous contrast. Lower chest: Mild emphysematous changes of the visualized lung bases. Several punctate calcified granuloma at the left lung base. Probable trace right pleural effusion. Partially visualized high attenuating pericardial effusion measuring up to 8 mm in thickness most consistent with hemopericardium. This is new since the prior CT. Clinical correlation is recommended. There is coronary vascular calcification. No intra-abdominal free air or free fluid. No intraperitoneal or retroperitoneal hematoma. Hepatobiliary: Several hepatic hypodense lesions which are not completely characterized this non-contrast CT and measure up to approximately 3 cm. These demonstrate  fluid attenuation most consistent with cysts. High attenuating content within the gallbladder likely represents vicarious excretion of injected contrast from recent catheterization. Small gallstones are not excluded. No pericholecystic fluid. Pancreas: Unremarkable. No pancreatic ductal dilatation or surrounding inflammatory changes. Spleen: Normal in size without focal abnormality. Adrenals/Urinary Tract: The adrenal glands are unremarkable. Enhancing renal parenchyma secondary to recent contrast administration. Multiple bilateral renal cysts measure up to 3.3 cm on the left. Additional smaller hypodensities are too small to characterize. There is no hydronephrosis on either side. Excreted contrast noted the partially distended urinary bladder. Stomach/Bowel: There is sigmoid diverticulosis without active inflammatory changes. There is no bowel obstruction or active inflammation. The appendix is normal. There is a 4 cm duodenal diverticula. Vascular/Lymphatic: Advanced aortoiliac atherosclerotic disease. Left femoral venous line with tip in the left common femoral vein. Right common iliac artery catheterization with tip in the right external iliac artery. No hematoma. The IVC is grossly unremarkable. Small pocket of air in the upper IVC, likely introduced via catheterization. No portal venous gas. There is no adenopathy. Reproductive: The uterus and ovaries are grossly unremarkable. Other: None Musculoskeletal: Osteopenia with degenerative changes of the spine. No acute osseous pathology. IMPRESSION: 1. No acute intra-abdominal or pelvic pathology. No intraperitoneal or retroperitoneal hematoma. 2. Partially visualized hemopericardium. Clinical correlation is recommended. 3. Small pocket of air within the upper IVC, iatrogenic. 4. Sigmoid diverticulosis. 5. Advanced aortic Atherosclerosis (ICD10-I70.0). These results were called by telephone at the time of interpretation on 05/14/2019 at 7:52 pm to provider O'Neal,  who verbally acknowledged these results. Electronically Signed   By: Elgie Collard M.D.   On: 05/14/2019 19:56   Dg Chest 2 View  Result Date: 05/24/2019 CLINICAL DATA:  Acute respiratory failure with hypoxia hypercapnia. History of COPD. EXAM: CHEST -  2 VIEW COMPARISON:  CT chest, abdomen and pelvis 05/14/2019. Single-view of the chest 05/15/2019 and 05/16/2019. FINDINGS: Small layering pleural effusions are present bilaterally. The lungs are emphysematous but clear. Cardiopericardial silhouette is enlarged compatible pericardial effusion seen on the prior CT. IMPRESSION: Small layering pleural effusions. Enlarged cardiopericardial silhouette compatible with pericardial effusion as seen on prior CT. Emphysema. Electronically Signed   By: Drusilla Kanner M.D.   On: 05/24/2019 17:12   Dg Abd 1 View  Result Date: 05/15/2019 CLINICAL DATA:  Orogastric tube placement. EXAM: ABDOMEN - 1 VIEW COMPARISON:  07/02/2018 and chest x-ray 05/15/2019 FINDINGS: Evidence of patient's nasogastric tube with tip over the mid abdomen just right of midline likely over the distal stomach. Bowel gas pattern is nonobstructive. Remainder of the exam is unchanged including 2 catheters overlying the heart. IMPRESSION: Nonobstructive bowel gas pattern. Enteric tube with tip just right of midline in the mid abdomen likely over the distal stomach. Electronically Signed   By: Elberta Fortis M.D.   On: 05/15/2019 16:46   Ct Soft Tissue Neck Wo Contrast  Result Date: 05/10/2019 CLINICAL DATA:  Arterial stricture/occlusion head neck. COPD with short of breath and respiratory distress. EXAM: CT NECK WITHOUT CONTRAST TECHNIQUE: Multidetector CT imaging of the neck was performed following the standard protocol without intravenous contrast. COMPARISON:  None. FINDINGS: Pharynx and larynx: Normal. No mass or swelling. Normal airway. No pharyngeal edema. Salivary glands: No inflammation, mass, or stone. Thyroid: Negative Lymph nodes: No  pathologic lymph nodes in the neck. Vascular: Limited vascular evaluation without intravenous contrast. Arterial stricture or stenosis not evaluated on the study. There is atherosclerotic calcification in the carotid bulb bilaterally. Atherosclerotic calcification also in the cavernous carotid bilaterally. Limited intracranial: Negative Visualized orbits: Negative Mastoids and visualized paranasal sinuses: Negative Skeleton: Cervical spondylosis.  No acute skeletal abnormality. Upper chest: Mild apical emphysema. No acute abnormality in the lung apices. Other: None IMPRESSION: No acute abnormality Atherosclerotic calcification of the carotid arteries bilaterally. Arterial stricture occlusion not excluded on this study without intravenous contrast Airway intact. Electronically Signed   By: Marlan Palau M.D.   On: 05/10/2019 17:13   Ct Angio Chest Pe W And/or Wo Contrast  Result Date: 05/10/2019 CLINICAL DATA:  COPD, hypertension, respiratory distress, high pretest probability for PE EXAM: CT ANGIOGRAPHY CHEST WITH CONTRAST TECHNIQUE: Multidetector CT imaging of the chest was performed using the standard protocol during bolus administration of intravenous contrast. Multiplanar CT image reconstructions and MIPs were obtained to evaluate the vascular anatomy. CONTRAST:  75mL OMNIPAQUE IOHEXOL 350 MG/ML SOLN COMPARISON:  None. FINDINGS: Cardiovascular: Heart size upper limits normal. Some reflux of contrast from the right atrium into hepatic veins. The RV is nondilated. Satisfactory opacification of pulmonary arteries noted, and there is no evidence of pulmonary emboli. Moderate coronary calcifications. Fair contrast opacification of the thoracic aorta with no suggestion of dissection, aneurysm, or stenosis. There is classic 3-vessel brachiocephalic arch anatomy without proximal stenosis. Calcified plaque in the arch and descending thoracic segment. Visualized proximal abdominal aorta is atheromatous without  aneurysm. Mediastinum/Nodes:  no hilar or mediastinal adenopathy. Lungs/Pleura: No pleural effusion. No pneumothorax. Pulmonary emphysema. Upper Abdomen: 2.9 cm probable cyst in hepatic segment 4A. Smaller low-attenuation lesions in the right hepatic lobe are incompletely visualized and characterized. No acute findings. Musculoskeletal: Anterior vertebral endplate spurring at multiple levels in the mid and lower thoracic spine. No fracture or worrisome bone lesion. Review of the MIP images confirms the above findings. IMPRESSION: 1. Negative for acute PE  or thoracic aortic dissection. 2. Coronary and Aortic Atherosclerosis (ICD10-170.0). Electronically Signed   By: Corlis Leak M.D.   On: 05/10/2019 17:07   Dg Chest Port 1 View  Result Date: 05/16/2019 CLINICAL DATA:  ETT placement EXAM: PORTABLE CHEST 1 VIEW COMPARISON:  05/15/2019 FINDINGS: Endotracheal tube with the tip 3 cm above the carina. Nasogastric tube coursing below the diaphragm. Left-sided chest tube in unchanged position. Left jugular central venous catheter with the tip projecting over the SVC. Mediastinal drain in unchanged position. No focal consolidation, pleural effusion or pneumothorax. Mild left basilar atelectasis. Stable cardiomediastinal silhouette. Prior median sternotomy. IMPRESSION: 1. Support lines and tubing in satisfactory position. Electronically Signed   By: Elige Ko   On: 05/16/2019 08:07   Dg Chest Port 1 View  Result Date: 05/15/2019 CLINICAL DATA:  Central line placement EXAM: PORTABLE CHEST 1 VIEW COMPARISON:  05/15/2019, 5:23 a.m. FINDINGS: Interval placement of a left neck vascular catheter, tip projecting over the midportion of the SVC. Otherwise unchanged examination with endotracheal tube projecting above the carina, chest or mediastinal drainage tubes, and no acute abnormality of the lungs and no significant pneumothorax. IMPRESSION: 1. Interval placement of a left neck vascular catheter, tip projecting over the  midportion of the SVC. 2. Otherwise unchanged examination with endotracheal tube projecting above the carina, chest or mediastinal drainage tubes, and no acute abnormality of the lungs and no significant pneumothorax. Electronically Signed   By: Lauralyn Primes M.D.   On: 05/15/2019 11:33   Dg Chest Port 1 View  Result Date: 05/15/2019 CLINICAL DATA:  ET tube, chest tube, pneumothorax EXAM: PORTABLE CHEST 1 VIEW COMPARISON:  05/12/2019 FINDINGS: Endotracheal tube tip is 2.5 cm above the carina. Left basilar chest tube or mediastinal drain is in place. No pneumothorax. Bibasilar atelectasis. No visible effusions. IMPRESSION: Bibasilar atelectasis. No visible pneumothorax. Electronically Signed   By: Charlett Nose M.D.   On: 05/15/2019 08:29   Dg Chest Port 1 View  Result Date: 05/12/2019 CLINICAL DATA:  Shortness of breath and lethargy EXAM: PORTABLE CHEST 1 VIEW COMPARISON:  05/10/2019, 03/04/2019 FINDINGS: Hyperinflated lungs with emphysematous disease. Mild scarring at the bases. No consolidation or effusion. Stable cardiomediastinal silhouette with aortic atherosclerosis. Probable skin fold artifact over the right upper and lower chest. IMPRESSION: 1. Hyperinflated lungs with emphysematous disease. No focal pulmonary infiltrate. 2. Suspected skin fold artifact over the right upper lung, no definitive pneumothorax seen. Electronically Signed   By: Jasmine Pang M.D.   On: 05/12/2019 16:12   Dg Chest Portable 1 View  Result Date: 05/10/2019 CLINICAL DATA:  Pt brought in by [facility] with c/o SOB that started suddenly around noon. EMS reports no air movement, O2 sat 61% on RA, diaphoretic. EMS reports pt has become less lethargic. EMS gave 2 Albuterol neb, Solumedrol, Epi 0.15 subq. O2 sat increased to 98% with NRB but no change in mental status and pt denies SOB getting better. EMS reports pt took 1st dose of Amoxicillin at 1030 for ear pain that pt has been having for 2 days. Pt has taken PCN before with no  problems.HISTORY OF HTN, COPD EXAM: PORTABLE CHEST 1 VIEW COMPARISON:  03/04/2019 and older exams. FINDINGS: Cardiac silhouette is top-normal in size. No mediastinal hilar masses. Prominent bronchovascular markings, stable. Lungs hyperexpanded but otherwise clear with no evidence of pneumonia or edema. No convincing pleural effusion and no pneumothorax. Skeletal structures are demineralized but grossly intact. IMPRESSION: 1. No acute cardiopulmonary disease. Electronically Signed   By: Onalee Hua  Ormond M.D.   On: 05/10/2019 13:38   Ct Angio Chest/abd/pel For Dissection W And/or W/wo  Result Date: 05/14/2019 CLINICAL DATA:  85 year old female status post cardiac catheterization. Concern for dissection. EXAM: CT ANGIOGRAPHY CHEST, ABDOMEN AND PELVIS TECHNIQUE: Multidetector CT imaging through the chest, abdomen and pelvis was performed using the standard protocol during bolus administration of intravenous contrast. Multiplanar reconstructed images and MIPs were obtained and reviewed to evaluate the vascular anatomy. CONTRAST:  OMNIPAQUE IOHEXOL 350 MG/ML SOLN COMPARISON:  Earlier CT of the abdomen pelvis dated 05/14/2019 FINDINGS: CTA CHEST FINDINGS Cardiovascular: There is no cardiomegaly. High attenuating pericardial effusion measures up to 11 mm in thickness. This is most consistent with hemopericardium. There is multi vessel coronary vascular calcification. Evaluation of the heart is limited due to cardiac motion. There is moderate calcified and noncalcified plaque of the thoracic aorta. No aneurysmal dilatation or dissection. The visualized origins of the great vessels of the aortic arch appear patent. There is a diminutive left vertebral artery. The central pulmonary arteries appear patent. Mediastinum/Nodes: No hilar or mediastinal adenopathy. The esophagus is grossly unremarkable. No mediastinal fluid collection. Lungs/Pleura: There is emphysematous changes of the lungs. Trace bilateral pleural  effusions. No focal consolidation or pneumothorax. The central airways are patent. Musculoskeletal: Degenerative changes of the spine. No acute osseous pathology. Review of the MIP images confirms the above findings. CTA ABDOMEN AND PELVIS FINDINGS VASCULAR Aorta: Advanced atherosclerotic calcification. No aneurysmal dilatation or dissection. Celiac: Atherosclerotic calcification and narrowing of the origin of the celiac axis. The celiac axis remains patent. SMA: Atherosclerotic calcification and narrowing of the origin of the SMA. The SMA is patent. Renals: Atherosclerotic calcification of the origins of the renal arteries. The renal arteries remain patent although appear narrowed. Accessory arteries noted to the lower pole of the kidneys bilaterally. IMA: The IMA is patent. Inflow: Advanced atherosclerotic calcification of the iliac arteries. There is irregularity of the common iliac arteries with focal ectasia of the right common iliac artery measuring up to 13 mm. The iliac arteries remain patent. Right femoral artery catheter with tip in the right external iliac artery noted. Veins: No obvious venous abnormality within the limitations of this arterial phase study. A small pocket of air in the upper IVC, iatrogenic. Left common femoral vein catheter with tip left common iliac vein. No fluid collection or hematoma. Review of the MIP images confirms the above findings. NON-VASCULAR Hepatobiliary: Multiple hepatic hypodense lesions, likely cysts. Mild irregularity of the liver contour likely cirrhosis. There appears to be partial cannulization of the paraumbilical vein indicative of portal hypertension. No intrahepatic biliary ductal dilatation. Excreted contrast within the gallbladder. Pancreas: Unremarkable. No pancreatic ductal dilatation or surrounding inflammatory changes. Spleen: Normal in size without focal abnormality. Adrenals/Urinary Tract: The adrenal glands are unremarkable. Bilateral renal cysts  measuring up to 4 cm and smaller hypodense lesions which are not characterized. Hyperenhancement of the renal parenchyma concerning for a degree of renal dysfunction. Clinical correlation is recommended. No hydronephrosis. The urinary bladder is filled with excreted contrast. Stomach/Bowel: There is a 4 cm duodenal diverticulum. There is sigmoid diverticulosis without active inflammatory changes. A rectal tube is in place. There is no bowel obstruction. The appendix is normal. Lymphatic: No adenopathy. Reproductive: The uterus and ovaries are grossly unremarkable. Other: No intra-abdominal fluid or free air. No hematoma. Musculoskeletal: Osteopenia with degenerative changes of the spine. No acute osseous pathology. Review of the MIP images confirms the above findings. IMPRESSION: 1. No CT evidence of aortic dissection  or aneurysm.  No hematoma. 2. High attenuating pericardial effusion most consistent with hemopericardium. 3. Coronary vascular calcification. 4. Colonic diverticulosis. No bowel obstruction or active inflammation. Normal appendix. 5. Hyperenhancement of the renal parenchyma concerning for a degree of renal dysfunction. Clinical correlation is recommended. 6. Aortic Atherosclerosis (ICD10-I70.0) and Emphysema (ICD10-J43.9). Electronically Signed   By: Elgie Collard M.D.   On: 05/14/2019 21:03   US Abdomen Limited Ruq  Result Date: 05/24/2019 CLINICAL DATA:  Leukocytosis, elevated LFTs. EXAM: ULTRASOUND ABDOMEN LIMITED RIGHT UPPER QUADRANT COMPARISON:  CT abdomen pelvis 05/14/2019 FINDINGS: Gallbladder: Small amount of biliary sludge. No gallstones or wall thickening visualized. No sonographic Murphy sign noted by sonographer. Common bile duct: Diameter: 0.4 cm Liver: There are two hepatic cysts as seen on prior CT, one in the posterior right hepatic lobe measuring 1.6 x 1.2 x 1.6 cm and another in the right hepatic lobe measuring 4.8 x 2.7 x 4.1 cm. Within normal limits in parenchymal  echogenicity. Portal vein is patent on color Doppler imaging with normal direction of blood flow towards the liver. Other: None. IMPRESSION: 1. No sonographic finding to explain the patient's leukocytosis and elevated LFTs. 2. Small amount of biliary sludge. No evidence of acute gallbladder inflammation. 3.  Hepatic cysts. Electronically Signed   By: Emmaline Kluver M.D.   On: 05/24/2019 21:14    Assessment and Plan  NSTEMI/stent/acute respiratory failure with hypoxia -presented with shortness of breath; troponin peaked at 1600; patient underwent cardiac cath and received PCI proximal to mid RCA with PTCA and tandem stenting DES; plan for DAPT for 12 months; patient treated with ASA and Brilinta which was later changed to Plavix and Eliquis SNF-admitted for OT/PT; continue Plavix 75 mg daily  Hemopericardium/hypotension/atrial fibrillation -CT of abdomen and chest showed no retroperitoneal bleed but did show pericardial effusion.  Patient taken to cath again to look for coronary arteries but it was stable.  Cardiothoracic surgeon recommended pericardial window with the amount of thrombus present and patient was taken to the OR where 250 mils of blood was evacuated and a perforation was noted along the posterior wall in the distribution of the posterior descending artery, there was active bleeding from this perforation.  Patient left the OR with chest tubes and on vent as patient began to improve and Levophed was weaned she developed atrial fibrillation requiring IV amiodarone but patient continued atrial fibrillation off.  At this point it was felt that her atrial flutter was being driven by pericardial inflammation and should get better in the coming weeks and her Brilinta was changed to Plavix and her aspirin was stopped and Eliquis was added; she did have some bradycardia and her amiodarone dose was adjusted. SNF-patient admitted for OT/PT; continue amiodarone 200 mg q. Monday Wednesday Friday and 100  mg all other days Plavix 75 mg daily, Eliquis 2.5 mg twice daily; continue Lipitor 40 mg daily  Acute blood loss anemia postop-patient was transfused with 3 units PRBC SNF-follow-up CBC  Hyperlipidemia SNF-not stated as uncontrolled; continue Lipitor 40 mg daily  COPD SNF-briefly mention that she had had an exacerbation and it was treated with antibiotic and steroid; patient was not discharged on any steroids, only guaifenesin 600 mg twice daily and Dulera 2 puffs twice daily  GERD SNF-status uncontrolled; continue Protonix 40 mg twice daily  Hypothyroidism SNF-TSH slightly elevated will follow as patient is on amiodarone; for now continue levothyroxine 75 mg daily   Time spent greater than 45 minutes;> 50% of time with patient  was spent reviewing records, labs, tests and studies, counseling and developing plan of care  Hennie Duos, MD

## 2019-06-06 ENCOUNTER — Telehealth: Payer: Self-pay | Admitting: Cardiology

## 2019-06-06 NOTE — Telephone Encounter (Signed)
Will forward to Cecilie Kicks, NP as an Juluis Rainier.

## 2019-06-06 NOTE — Telephone Encounter (Signed)
Pt's daughter Tessie Fass would like someone to please give her a call after the pts apt on Monday with Cecilie Kicks since she's admitted at the [facility] she doesn't know what is going on and the pt will have a virtual visit.   [phone]

## 2019-06-07 ENCOUNTER — Encounter: Payer: Self-pay | Admitting: Thoracic Surgery (Cardiothoracic Vascular Surgery)

## 2019-06-07 ENCOUNTER — Encounter: Payer: Self-pay | Admitting: Internal Medicine

## 2019-06-07 NOTE — Progress Notes (Signed)
Virtual Visit via Telephone Note   This visit type was conducted due to national recommendations for restrictions regarding the COVID-19 Pandemic (e.g. social distancing) in an effort to limit this patient's exposure and mitigate transmission in our community.  Due to her co-morbid illnesses, this patient is at least at moderate risk for complications without adequate follow up.  This format is felt to be most appropriate for this patient at this time.  The patient did not have access to video technology/had technical difficulties with video requiring transitioning to audio format only (telephone).  All issues noted in this document were discussed and addressed.  No physical exam could be performed with this format.  Please refer to the patient's chart for her  consent to telehealth for [facility].   Date:  06/10/2019   ID:  Amy, Chambers 09/27/1927, MRN [id number]  Patient Location: Skilled Nursing Facility Provider Location: Office  PCP:  Bennie Pierini, [name]  Cardiologist:  Dina Rich, MD  Electrophysiologist:  None   Evaluation Performed:  Follow-Up Visit  Chief Complaint:  Post hospital, NSTEMI   History of Present Illness:    Amy Chambers is a 85 y.o. female with post hospital for NSTEMI, COPD and acute respiratory failure wit hypoxia and hypercapnia, on vent for period of time post op,  s/p stent placement and pericardial tamponade with acute blood loss anemia (rec'd 3 units PRBC)  requiring intubation, pressors, recath with stable PCI site and proceeded to emergent median sternotomy for hemopericardium and clot found along Rt side of heart,  with evacuation and chest tube placement.  She had PAF at times with long coonversion pauses.  Was seen by EP and no pacemaker at that time.  Though may be needed in future.  She was extubated post op the next day and chest tubes came out in 2 days.   She slowly recovered from her COPD and at discharge was feeling much better.   She was discharged to SNF for rehab.  She was on amiodarone and BB but with bradycardia the BB this was stopped.  Prior to discharge she did have another bit of PAF and conversion pauses though not as long.   Pt discharged on plavix and Eliquis, no ASA.  She had dysphasia and swallowing study was done and discharged on dysphasia 2 diet.  She is to have PT and OT at SNF. Pt was a DNR when she left the hospital.    Today she is ding well, no chest pain and SOB with working with PT, but if she goes slow she does ok.  Labs were done today and stable.  Mild anemia.  She notes Rt groin hematoma but looks smaller.  No pain.  She is eating fairly well.  No lightheadedness or dizziness.  She is to see surgeon on Friday.     The patient does not have symptoms concerning for COVID-19 infection (fever, chills, cough, or new shortness of breath).  She is quarantined at [facility] for 2 weeks - she is unsure when she can go home.      Past Medical History:  Diagnosis Date   Cataract    COPD (chronic obstructive pulmonary disease) (HCC)    Dysphagia 05/31/2019   GERD (gastroesophageal reflux disease)    Hypertension    Osteopenia    Thyroid disease    Past Surgical History:  Procedure Laterality Date   CATARACT EXTRACTION, BILATERAL     CORONARY STENT INTERVENTION N/A 05/14/2019  Procedure: CORONARY STENT INTERVENTION;  Surgeon: Troy Sine, MD;  Location: [place] [facility];  Service: Cardiovascular;  Laterality: N/A;   LEFT HEART CATH AND CORONARY ANGIOGRAPHY N/A 05/14/2019   Procedure: LEFT HEART CATH AND CORONARY ANGIOGRAPHY;  Surgeon: Leonie Man, MD;  Location: [place] [facility];  Service: Cardiovascular;  Laterality: N/A;   LEFT HEART CATH AND CORONARY ANGIOGRAPHY N/A 05/14/2019   Procedure: LEFT HEART CATH AND CORONARY ANGIOGRAPHY;  Surgeon: Troy Sine, MD;  Location: [place] [facility];  Service: Cardiovascular;  Laterality: N/A;   MEDIASTINAL EXPLORATION N/A 05/14/2019    Procedure: Mediastinal Exploration with Washout;  Surgeon: Lajuana Matte, MD;  Location: [place];  Service: Open Heart Surgery;  Laterality: N/A;     Current Meds  Medication Sig   amiodarone (PACERONE) 100 MG tablet Take 1 tablet (100 mg total) by mouth every Tuesday, Thursday, Saturday, and Sunday.   amiodarone (PACERONE) 200 MG tablet Take 1 tablet (200 mg total) by mouth every Monday, Wednesday, and Friday.   apixaban (ELIQUIS) 2.5 MG TABS tablet Take 1 tablet (2.5 mg total) by mouth 2 (two) times daily.   atorvastatin (LIPITOR) 40 MG tablet Take 1 tablet (40 mg total) by mouth daily at 6 PM.   bisacodyl (DULCOLAX) 10 MG suppository Place 1 suppository (10 mg total) rectally daily as needed for moderate constipation.   calcium-vitamin D 250-100 MG-UNIT per tablet Take 1 tablet by mouth 2 (two) times daily.    clopidogrel (PLAVIX) 75 MG tablet Take 1 tablet (75 mg total) by mouth daily.   diphenhydrAMINE-zinc acetate (BENADRYL) cream Apply topically 2 (two) times daily as needed for itching.   feeding supplement, ENSURE ENLIVE, (ENSURE ENLIVE) LIQD Take 237 mLs by mouth 3 (three) times daily between meals.   furosemide (LASIX) 20 MG tablet Take 1 tablet (20 mg total) by mouth daily.   guaiFENesin (MUCINEX) 600 MG 12 hr tablet Take 1 tablet (600 mg total) by mouth 2 (two) times daily.   levothyroxine (SYNTHROID) 75 MCG tablet Take 1 tablet (75 mcg total) by mouth daily.   loratadine (CLARITIN) 10 MG tablet Take 1 tablet (10 mg total) by mouth daily.   mometasone-formoterol (DULERA) 200-5 MCG/ACT AERO Inhale 2 puffs into the lungs 2 (two) times daily.   Multiple Vitamin (MULTIVITAMIN WITH MINERALS) TABS tablet Take 1 tablet by mouth daily.   NON FORMULARY Diet change: Dysphagia 3, thin liquids; will allow cereal for breakfast   pantoprazole (PROTONIX) 40 MG tablet Take 1 tablet (40 mg total) by mouth 2 (two) times daily.   polyethylene glycol (MIRALAX / GLYCOLAX) packet  Take 17 g by mouth daily.   Vitamin D, Cholecalciferol, 25 MCG (1000 UT) TABS Take 1 tablet by mouth daily.     Allergies:   Crestor [rosuvastatin calcium] and Niaspan [niacin er]   Social History   Tobacco Use   Smoking status: Former Smoker    Quit date: 08/15/1994    Years since quitting: 24.8   Smokeless tobacco: Former Systems developer    Types: Chew    Quit date: 08/15/1998  Substance Use Topics   Alcohol use: No   Drug use: No     Family Hx: The patient's family history includes Aneurysm in her sister; COPD in her brother and sister; Cancer in her brother and sister; Diabetes in her mother; Early death in her brother, sister, and sister; Emphysema in her brother and sister; Fibromyalgia in her daughter; Hypertension in her daughter, daughter, and  son; Stroke in her sister; Tuberculosis in her father.  ROS:   Please see the history of present illness.    General:no colds or fevers, no weight changes Skin:no rashes or ulcers HEENT:no blurred vision, no congestion CV:see HPI PUL:see HPI GI:no diarrhea constipation or melena, no indigestion GU:no hematuria, no dysuria MS:no joint pain, no claudication Neuro:no syncope, no lightheadedness Endo:no diabetes, + thyroid disease  All other systems reviewed and are negative.   Prior CV studies:   The following studies were reviewed today:  Echo 05/11/19 IMPRESSIONS   1. Left ventricular ejection fraction, by visual estimation, is 45 to 50%. The left ventricle has normal function. Normal left ventricular size. There is no left ventricular hypertrophy. 2. There is mild hypokinesis of the basal to mid inferoseptum/inferior wall. This is concerning for RCA ischemia/infarction. 3. Left ventricular diastolic Doppler parameters are consistent with impaired relaxation pattern of LV diastolic filling. 4. Global right ventricle has normal systolic function.The right ventricular size is normal. No increase in right ventricular wall  thickness. 5. Left atrial size was normal. 6. Right atrial size was normal. 7. Presence of pericardial fat pad. 8. The pericardial effusion is anterior to the right ventricle. 9. Trivial pericardial effusion is present. 10. Moderate mitral annular calcification. 11. The mitral valve is normal in structure. Trace mitral valve regurgitation. No evidence of mitral stenosis. 12. The tricuspid valve is normal in structure. Tricuspid valve regurgitation is mild. 13. The aortic valve is tricuspid Aortic valve regurgitation is trivial by color flow Doppler. Moderate calcification of the AoV with focal calcification of the NCC. No aortic stenosis. 14. The pulmonic valve was grossly normal. Pulmonic valve regurgitation is mild by color flow Doppler. 15. Moderately elevated pulmonary artery systolic pressure. 16. The inferior vena cava is normal in size with greater than 50% respiratory variability, suggesting right atrial pressure of 3 mmHg.  FINDINGS Left Ventricle: Left ventricular ejection fraction, by visual estimation, is 45 to 50%. The left ventricle has normal function. There is no left ventricular hypertrophy. Normal left ventricular size. Spectral Doppler shows Left ventricular diastolic  Doppler parameters are consistent with impaired relaxation pattern of LV diastolic filling. Normal left ventricular filling pressures. There is mild hypokinesis of the basal to mid inferoseptum/inferior wall. This is concerning for RCA  ischemia/infarction.  Right Ventricle: The right ventricular size is normal. No increase in right ventricular wall thickness. Global RV systolic function is has normal systolic function. The tricuspid regurgitant velocity is 3.38 m/s, and with an assumed right atrial pressure of 3 mmHg, the estimated right ventricular systolic pressure is moderately elevated at 48.7 mmHg.  Left Atrium: Left atrial size was normal in size.  Right Atrium: Right atrial size was normal  in size  Pericardium: Trivial pericardial effusion is present. The pericardial effusion is anterior to the right ventricle. Presence of pericardial fat pad.  Mitral Valve: The mitral valve is normal in structure. Moderate mitral annular calcification. No evidence of mitral valve stenosis by observation. Trace mitral valve regurgitation.  Tricuspid Valve: The tricuspid valve is normal in structure. Tricuspid valve regurgitation is mild by color flow Doppler.  Aortic Valve: The aortic valve is tricuspid. Aortic valve regurgitation is trivial by color flow Doppler. Moderate calcification of the AoV with focal calcification of the NCC. No aortic stenosis.  Pulmonic Valve: The pulmonic valve was grossly normal. Pulmonic valve regurgitation is mild by color flow Doppler.  Aorta: The aortic root, ascending aorta and aortic arch are all structurally normal, with no  evidence of dilitation or obstruction.  Venous: The inferior vena cava is normal in size with greater than 50% respiratory variability, suggesting right atrial pressure of 3 mmHg.  IAS/Shunts: No atrial level shunt detected by color flow Doppler. No ventricular septal defect is seen or detected. There is no evidence of an atrial septal defect.    LEFT VENTRICLE PLAX 2D LVIDd: 4.40 cm Diastology LVIDs: 2.70 cm LV e' lateral: 8.27 cm/s LV PW: 1.00 cm LV E/e' lateral: 12.6 LV IVS: 0.80 cm LV e' medial: 4.90 cm/s LVOT diam: 1.80 cm LV E/e' medial: 21.2 LV SV: 61 ml LV SV Index: 40.17 LVOT Area: 2.54 cm   Cardiac cath 05/14/19   Prox RCA lesion is 30% stenosed.  Prox RCA to Mid RCA lesion is 80% stenosed.  1st Mrg lesion is 40% stenosed.  Mid Cx lesion is 20% stenosed.  Mid LAD lesion is 20% stenosed.  Dist RCA lesion is 20% stenosed.  A stent was successfully placed.  Post intervention, there is a 0% residual stenosis.  Post intervention, there  is a 0% residual stenosis.  Mid RCA to Dist RCA lesion is 20% stenosed.  LV end diastolic pressure is moderately elevated.  Coronary artery obstructive disease with mild coronary calcification. The LAD has 20% mild stenosis in its proximal to mid segment and dips intramyocardially in the mid LAD without systolic muscle bridging; there is mild 20% proximal circumflex stenosis with 40% narrowing and a small first marginal branch ostially. RCA is a very large dominant vessel with coronary calcification and sharp angulation proximally and in its mid segment. There is 70 -80% stenosis proximal to mid segment with TIMI-3 flow and mild 20% distal RCA stenoses.  LV dysfunction with EF estimate at 50% with mid to basal inferior hypocontractility. LVEDP elevated at 28 mm.  Very difficult but successful percutaneous coronary intervention involving the calcified sharp angled proximal to mid RCA with ultimate PTCA, and tandem stenting necessitating Guideliner support ultimate insertion of a 3.5 x 20 mm Synergy and 3.0 x 12 mm Synergy stents postdilated to 3.6 to 3.5 mm with the region being reduced to 0%.  RECOMMENDATION: DAPT for a minimum of 12 months. Aggressive lipid-lowering therapy with target LDL less than 70. Optimal blood pressure control with target blood pressure less than 130/80.    05/14/19  Limited Echo  IMPRESSIONS   1. Left ventricular ejection fraction, by visual estimation, is 55%. The left ventricle has normal function. Normal left ventricular size. There is mildly increased left ventricular hypertrophy. The left ventricle was not fully evaluated, unable to  comment on wall motion pattern. 2. Left ventricular diastolic function could not be evaluated pattern of LV diastolic filling. 3. Global right ventricle has normal systolic function.The right ventricular size is normal. No increase in right ventricular wall thickness. 4. Left atrial size was normal. 5. Right  atrial size was normal. 6. Mild mitral annular calcification. 7. The mitral valve is normal in structure. No evidence of mitral valve regurgitation. No evidence of mitral stenosis. 8. The tricuspid valve is not assessed. Tricuspid valve regurgitation was not assessed by color flow Doppler. 9. The aortic valve was not assessed Aortic valve regurgitation was not assessed by color flow Doppler. Mild aortic valve sclerosis without stenosis. 10. The pulmonic valve was not assessed. Pulmonic valve regurgitation was not assessed by color flow Doppler. 11. The interatrial septum was not assessed. 12. Moderate pericardial effusion with extensive fibrinous material suggestive of hematoma. There was >25% respirophasic variation of  mitral E inflow velocity. The IVC measured 1.8 cm but collapsed <50%. There was RV diastolic indentation noted.  Findings suggestive of tamponade.  FINDINGS Left Ventricle: Left ventricular ejection fraction, by visual estimation, is 55%. The left ventricle has normal function. There is mildly increased left ventricular hypertrophy. Normal left ventricular size. Spectral Doppler shows Left ventricular  diastolic function could not be evaluated pattern of LV diastolic filling.  Right Ventricle: The right ventricular size is normal. No increase in right ventricular wall thickness. Global RV systolic function is has normal systolic function.  Left Atrium: Left atrial size was normal in size.  Right Atrium: Right atrial size was normal in size  Pericardium: A moderately sized pericardial effusion is present. Moderate pericardial effusion with extensive fibrinous material suggestive of hematoma. There was >25% respirophasic variation of mitral E inflow velocity. The IVC measured 1.8 cm but  collapsed < 50%. There was RV diastolic indentation noted. Findings suggestive of tamponade.  Mitral Valve: The mitral valve is normal in structure. Mild mitral annular  calcification. No evidence of mitral valve stenosis by observation. No evidence of mitral valve regurgitation.  Tricuspid Valve: The tricuspid valve is not assessed. Tricuspid valve regurgitation was not assessed by color flow Doppler.  Aortic Valve: The aortic valve was not assessed. Aortic valve regurgitation was not assessed by color flow Doppler. Mild aortic valve sclerosis is present, with no evidence of aortic valve stenosis.  Pulmonic Valve: The pulmonic valve was not assessed. Pulmonic valve regurgitation was not assessed by color flow Doppler.  Aorta: The aortic root is normal in size and structure.  Shunts: The interatrial septum was not assessed.  cardiac cath 05/14/19  No evidence of extravasation noted. Late venous phase contrast filling of what appears to be the right atrium noted, cannot exclude AV fistula filling the pericardium however.  Ost RCA lesion is 55% stenosed. Prior to the stent  Previously placed Prox RCA stent (drug-eluting) is widely patent.  Previously placed Prox RCA to Mid RCA stent (drug-eluting) is widely patent.  LV end diastolic pressure is normal.  SUMMARY  No obvious evidence of extravasation from the PCI site  Upstream 50% focal lesion (proximal from stent)  Relatively normal LVEDP with mild systemic hypotension on pressors  Likely metabolic acidosis with initial pH of 7.21 -> was given 1 amp of bicarb   RECOMMENDATION  The patient will go from Cath Lab to the OR for likely anterior approach pericardial window and VATS (will likely need to explore for source of bleed)  I have written to hold Brilinta. Once stable postop, would need to restart  Would for now treat the proximal pre-stent stenosis medically => desire to avoid further intervention at this time.   05/15/19 OP note POST-OPERATIVE DIAGNOSIS:1. S/p NSTEMI 2. S/p PCI with Synergy DES stents x 2 to RCA 3.Hemopericardium  PROCEDURE:EMERGENT MEDIAN  STERNOTOMY FINDINGS: Clot found along right side of heart    Labs/Other Tests and Data Reviewed:    EKG:  No ECG reviewed.stable at discharge from hospital.   Recent Labs: 05/11/2019: B Natriuretic Peptide 1,643.2 05/31/2019: TSH 7.685 06/10/2019: ALT 24; BUN 24; Creatinine, Ser 0.88; Hemoglobin 10.8; Platelets 249; Potassium 5.0; Sodium 138   Recent Lipid Panel Lab Results  Component Value Date/Time   CHOL 204 (H) 08/30/2018 10:45 AM   CHOL 149 02/04/2013 10:53 AM   TRIG 140 08/30/2018 10:45 AM   TRIG 220 (H) 10/16/2014 11:28 AM   TRIG 129 02/04/2013 10:53 AM   HDL 46 08/30/2018  10:45 AM   HDL 33 (L) 10/16/2014 11:28 AM   HDL 35 (L) 02/04/2013 10:53 AM   CHOLHDL 4.4 08/30/2018 10:45 AM   LDLCALC 130 (H) 08/30/2018 10:45 AM   LDLCALC 108 (H) 02/12/2014 10:37 AM   LDLCALC 88 02/04/2013 10:53 AM    Wt Readings from Last 3 Encounters:  06/05/19 118 lb 4.8 oz (53.7 kg)  06/04/19 118 lb 4.8 oz (53.7 kg)  05/31/19 121 lb 14.6 oz (55.3 kg)     Objective:    Vital Signs:  BP 139/77    Pulse 81    Temp 98.1 F (36.7 C) (Oral)    Resp 18    SpO2 93%    VITAL SIGNS:  reviewed  General voice is strong.   Neuro alert and oriented and answered questions approp Lungs could speak in complete sentences without problems or SOB  ASSESSMENT & PLAN:    1. NSTEMI with stent to the calcified sharp angled proximal to mid RCA with ultimate PTCA, and tandem stenting necessitating Guideliner support ultimate insertion of a 3.5 x 20 mm Synergy and 3.0 x 12 mm Synergy stents postdilated to 3.6 to 3.5 mm with the region being reduced to 0%.  No angina today.  On plavix and eliquis, did not tolerate BB on statin 2. CAD see above.  3. Pericardial tamponade and hemo pericardium with open chest and evacuation stable to see Dr. Cliffton AstersLightfoot on the 30th.  4. Groin hematoma, per pt, smaller H/H stable. 5. PAF on eliqis. No ASA but on plavix  She is on amiodarone 100 mg alt with 200 mg   6. anticoagulation on eliquis. 7. Bradycardia and conversion pauses from a fib, SSS will have her wear 30 day event monitor to eval for further brady.  We stopped her BB due to bradycardia.   8. COPD with acute exacerbation treated and stable. 9. Hypothyroid will need to follow on amiodarone.  10. Anemia due to ABL (transfused 3 units in hospital)stable on eliquis and plavix  11. HLD on statin  She will follow up with Dr. Wyline MoodBranch in 5-6 weeks. Continue with PT/OT   Please note I discussed with daughter as well on another call.  COVID-19 Education: The signs and symptoms of COVID-19 were discussed with the patient and how to seek care for testing (follow up with PCP or arrange E-visit).  The importance of social distancing was discussed today.  Time:   Today, I have spent 18 minutes with the patient with telehealth technology discussing the above problems.     Medication Adjustments/Labs and Tests Ordered: Current medicines are reviewed at length with the patient today.  Concerns regarding medicines are outlined above.   Tests Ordered: No orders of the defined types were placed in this encounter.   Medication Changes: No orders of the defined types were placed in this encounter.   Follow Up:  In Person in 5 week(s)  Signed, Nada BoozerLaura Preston Weill, NP  06/10/2019 2:18 PM    [facility]

## 2019-06-09 DIAGNOSIS — E43 Unspecified severe protein-calorie malnutrition: Secondary | ICD-10-CM | POA: Insufficient documentation

## 2019-06-09 DIAGNOSIS — K5909 Other constipation: Secondary | ICD-10-CM | POA: Insufficient documentation

## 2019-06-10 ENCOUNTER — Encounter: Payer: Self-pay | Admitting: Cardiology

## 2019-06-10 ENCOUNTER — Non-Acute Institutional Stay (SKILLED_NURSING_FACILITY): Payer: Medicare Other | Admitting: Adult Health

## 2019-06-10 ENCOUNTER — Encounter: Payer: Self-pay | Admitting: Adult Health

## 2019-06-10 ENCOUNTER — Encounter (HOSPITAL_COMMUNITY)
Admission: RE | Admit: 2019-06-10 | Discharge: 2019-06-10 | Disposition: A | Discharge status: Home / Self Care | Payer: Medicare Other | Source: Skilled Nursing Facility | Attending: Adult Health | Admitting: Adult Health

## 2019-06-10 ENCOUNTER — Telehealth (INDEPENDENT_AMBULATORY_CARE_PROVIDER_SITE_OTHER): Payer: Medicare Other | Admitting: Cardiology

## 2019-06-10 VITALS — BP 139/77 | HR 81 | Temp 98.1°F | Resp 18

## 2019-06-10 DIAGNOSIS — I214 Non-ST elevation (NSTEMI) myocardial infarction: Secondary | ICD-10-CM | POA: Diagnosis not present

## 2019-06-10 DIAGNOSIS — J441 Chronic obstructive pulmonary disease with (acute) exacerbation: Secondary | ICD-10-CM

## 2019-06-10 DIAGNOSIS — D62 Acute posthemorrhagic anemia: Secondary | ICD-10-CM

## 2019-06-10 DIAGNOSIS — E039 Hypothyroidism, unspecified: Secondary | ICD-10-CM

## 2019-06-10 DIAGNOSIS — I314 Cardiac tamponade: Secondary | ICD-10-CM

## 2019-06-10 DIAGNOSIS — I480 Paroxysmal atrial fibrillation: Secondary | ICD-10-CM

## 2019-06-10 DIAGNOSIS — R001 Bradycardia, unspecified: Secondary | ICD-10-CM

## 2019-06-10 DIAGNOSIS — G44209 Tension-type headache, unspecified, not intractable: Secondary | ICD-10-CM | POA: Diagnosis not present

## 2019-06-10 DIAGNOSIS — E782 Mixed hyperlipidemia: Secondary | ICD-10-CM

## 2019-06-10 DIAGNOSIS — I5042 Chronic combined systolic (congestive) and diastolic (congestive) heart failure: Secondary | ICD-10-CM | POA: Insufficient documentation

## 2019-06-10 DIAGNOSIS — I2510 Atherosclerotic heart disease of native coronary artery without angina pectoris: Secondary | ICD-10-CM

## 2019-06-10 DIAGNOSIS — I495 Sick sinus syndrome: Secondary | ICD-10-CM

## 2019-06-10 DIAGNOSIS — S301XXD Contusion of abdominal wall, subsequent encounter: Secondary | ICD-10-CM

## 2019-06-10 DIAGNOSIS — Z95820 Peripheral vascular angioplasty status with implants and grafts: Secondary | ICD-10-CM

## 2019-06-10 LAB — CBC
HCT: 36.7 % (ref 36.0–46.0)
Hemoglobin: 10.8 g/dL — ABNORMAL LOW (ref 12.0–15.0)
MCH: 29.8 pg (ref 26.0–34.0)
MCHC: 29.4 g/dL — ABNORMAL LOW (ref 30.0–36.0)
MCV: 101.4 fL — ABNORMAL HIGH (ref 80.0–100.0)
Platelets: 249 10*3/uL (ref 150–400)
RBC: 3.62 MIL/uL — ABNORMAL LOW (ref 3.87–5.11)
RDW: 19.3 % — ABNORMAL HIGH (ref 11.5–15.5)
WBC: 8.3 10*3/uL (ref 4.0–10.5)
nRBC: 0 % (ref 0.0–0.2)

## 2019-06-10 LAB — COMPREHENSIVE METABOLIC PANEL
ALT: 24 U/L (ref 0–44)
AST: 26 U/L (ref 15–41)
Albumin: 3.3 g/dL — ABNORMAL LOW (ref 3.5–5.0)
Alkaline Phosphatase: 79 U/L (ref 38–126)
Anion gap: 9 (ref 5–15)
BUN: 24 mg/dL — ABNORMAL HIGH (ref 8–23)
CO2: 27 mmol/L (ref 22–32)
Calcium: 9 mg/dL (ref 8.9–10.3)
Chloride: 102 mmol/L (ref 98–111)
Creatinine, Ser: 0.88 mg/dL (ref 0.44–1.00)
GFR calc Af Amer: >60 mL/min (ref >60)
GFR calc non Af Amer: 58 mL/min — ABNORMAL LOW (ref >60)
Glucose, Bld: 97 mg/dL (ref 70–99)
Potassium: 5 mmol/L (ref 3.5–5.1)
Sodium: 138 mmol/L (ref 135–145)
Total Bilirubin: 0.7 mg/dL (ref 0.3–1.2)
Total Protein: 6.2 g/dL — ABNORMAL LOW (ref 6.5–8.1)

## 2019-06-10 NOTE — Progress Notes (Signed)
Location:    [facility] Room Number: 156/W Place of Service:  SNF (31)   CODE STATUS: DNR  Allergies  Allergen Reactions   Crestor [Rosuvastatin Calcium] Other (See Comments)    Cramping/pain in lower extremeties   Niaspan [Niacin Er] Other (See Comments)    Cramping/pain in lower extremeties    Chief Complaint  Patient presents with   Acute Visit    headache    HPI:  She is complaining of right side headache. The pain is sharpe in nature. The pain starts in her right neck and goes into her head. She does have some scabbed areas on her scalp which are resolving. She does feel as though the pain goes into her eye as well. She denies any vertigo; no sound or light sensitivity. No nausea or vomiting.   Past Medical History:  Diagnosis Date   Cataract    COPD (chronic obstructive pulmonary disease) (HCC)    Dysphagia 05/31/2019   GERD (gastroesophageal reflux disease)    Hypertension    Osteopenia    Thyroid disease     Past Surgical History:  Procedure Laterality Date   CATARACT EXTRACTION, BILATERAL     CORONARY STENT INTERVENTION N/A 05/14/2019   Procedure: CORONARY STENT INTERVENTION;  Surgeon: Lennette BihariKelly, Thomas A, MD;  Location: [facility] INVASIVE CV LAB;  Service: Cardiovascular;  Laterality: N/A;   LEFT HEART CATH AND CORONARY ANGIOGRAPHY N/A 05/14/2019   Procedure: LEFT HEART CATH AND CORONARY ANGIOGRAPHY;  Surgeon: Marykay LexHarding, David W, MD;  [facility];  Service: Cardiovascular;  Laterality: N/A;   LEFT HEART CATH AND CORONARY ANGIOGRAPHY N/A 05/14/2019   Procedure: LEFT HEART CATH AND CORONARY ANGIOGRAPHY;  Surgeon: Lennette BihariKelly, Thomas A, MD;  Location: [facility] INVASIVE CV LAB;  Service: Cardiovascular;  Laterality: N/A;   MEDIASTINAL EXPLORATION N/A 05/14/2019   Procedure: Mediastinal Exploration with Washout;  Surgeon: Corliss SkainsLightfoot, Harrell O, MD;  Location: [facility] OR;  Service: Open Heart Surgery;  Laterality: N/A;    Social History    Socioeconomic History   Marital status: Married    Spouse name: Not on file   Number of children: Not on file   Years of education: Not on file   Highest education level: Not on file  Occupational History   Not on file  Social Needs   Financial resource strain: Not on file   Food insecurity    Worry: Not on file    Inability: Not on file   Transportation needs    Medical: Not on file    Non-medical: Not on file  Tobacco Use   Smoking status: Former Smoker    Quit date: 08/15/1994    Years since quitting: 24.8   Smokeless tobacco: Former NeurosurgeonUser    Types: Chew    Quit date: 08/15/1998  Substance and Sexual Activity   Alcohol use: No   Drug use: No   Sexual activity: Not Currently  Lifestyle   Physical activity    Days per week: Not on file    Minutes per session: Not on file   Stress: Not on file  Relationships   Social connections    Talks on phone: Not on file    Gets together: Not on file    Attends religious service: Not on file    Active member of club or organization: Not on file    Attends meetings of clubs or organizations: Not on file    Relationship status: Not on file   Intimate  partner violence    Fear of current or ex partner: Not on file    Emotionally abused: Not on file    Physically abused: Not on file    Forced sexual activity: Not on file  Other Topics Concern   Not on file  Social History Narrative   Not on file   Family History  Problem Relation Age of Onset   Diabetes Mother    Cancer Brother    Tuberculosis Father    Early death Sister    Early death Brother    COPD Brother    Emphysema Brother    Early death Sister    COPD Sister    Emphysema Sister    Aneurysm Sister    Stroke Sister    Cancer Sister    Hypertension Son    Hypertension Daughter    Fibromyalgia Daughter    Hypertension Daughter       VITAL SIGNS BP 122/73    Pulse 81    Temp 98 F (36.7 C) (Oral)    Resp 20    Ht 4\' 11"   (1.499 m)    Wt 117 lb 6.4 oz (53.3 kg)    BMI 23.71 kg/m   Outpatient Encounter Medications as of 06/10/2019  Medication Sig   acetaminophen (TYLENOL) 325 MG tablet Take 2 tablets (650 mg total) by mouth every 6 (six) hours as needed for mild pain or headache.   amiodarone (PACERONE) 100 MG tablet Take 1 tablet (100 mg total) by mouth every Tuesday, Thursday, Saturday, and Sunday.   amiodarone (PACERONE) 200 MG tablet Take 1 tablet (200 mg total) by mouth every Monday, Wednesday, and Friday.   apixaban (ELIQUIS) 2.5 MG TABS tablet Take 1 tablet (2.5 mg total) by mouth 2 (two) times daily.   atorvastatin (LIPITOR) 40 MG tablet Take 1 tablet (40 mg total) by mouth daily at 6 PM.   bisacodyl (DULCOLAX) 10 MG suppository Place 1 suppository (10 mg total) rectally daily as needed for moderate constipation.   calcium-vitamin D 250-100 MG-UNIT per tablet Take 1 tablet by mouth 2 (two) times daily.    clopidogrel (PLAVIX) 75 MG tablet Take 1 tablet (75 mg total) by mouth daily.   diphenhydrAMINE-zinc acetate (BENADRYL) cream Apply topically 2 (two) times daily as needed for itching.   feeding supplement, ENSURE ENLIVE, (ENSURE ENLIVE) LIQD Take 237 mLs by mouth 3 (three) times daily between meals.   furosemide (LASIX) 20 MG tablet Take 1 tablet (20 mg total) by mouth daily.   guaiFENesin (MUCINEX) 600 MG 12 hr tablet Take 1 tablet (600 mg total) by mouth 2 (two) times daily.   levothyroxine (SYNTHROID) 75 MCG tablet Take 1 tablet (75 mcg total) by mouth daily.   loratadine (CLARITIN) 10 MG tablet Take 1 tablet (10 mg total) by mouth daily.   mometasone-formoterol (DULERA) 200-5 MCG/ACT AERO Inhale 2 puffs into the lungs 2 (two) times daily.   Multiple Vitamin (MULTIVITAMIN WITH MINERALS) TABS tablet Take 1 tablet by mouth daily.   NON FORMULARY Diet change: Dysphagia 3, thin liquids; will allow cereal for breakfast   pantoprazole (PROTONIX) 40 MG tablet Take 1 tablet (40 mg total) by  mouth 2 (two) times daily.   polyethylene glycol (MIRALAX / GLYCOLAX) packet Take 17 g by mouth daily.   Vitamin D, Cholecalciferol, 25 MCG (1000 UT) TABS Take 1 tablet by mouth daily.   No facility-administered encounter medications on file as of 06/10/2019.      SIGNIFICANT DIAGNOSTIC EXAMS  PREVIOUS;   05-10-19: ct angio of chest:  1. Negative for acute PE or thoracic aortic dissection. 2. Coronary and Aortic Atherosclerosis   05-10-19: ct of soft tissue of neck:  No acute abnormality Atherosclerotic calcification of the carotid arteries bilaterally. Arterial stricture occlusion not excluded on this study without intravenous contrast Airway intact.   05-14-19: ct angio of chest abdomen and pelvis: 1. No CT evidence of aortic dissection or aneurysm.  No hematoma. 2. High attenuating pericardial effusion most consistent with hemopericardium. 3. Coronary vascular calcification. 4. Colonic diverticulosis. No bowel obstruction or active inflammation. Normal appendix. 5. Hyperenhancement of the renal parenchyma concerning for a degree of renal dysfunction. Clinical correlation is recommended. 6. Aortic Atherosclerosis and Emphysema     05-14-19: ct of abdomen and pelvis:  1. No acute intra-abdominal or pelvic pathology. No intraperitoneal or retroperitoneal hematoma. 2. Partially visualized hemopericardium. Clinical correlation is recommended. 3. Small pocket of air within the upper IVC, iatrogenic. 4. Sigmoid diverticulosis. 5. Advanced aortic Atherosclerosis   05-14-19: cardiac catheterization Coronary artery obstructive disease with mild coronary calcification.  The LAD has 20% mild stenosis in its proximal to mid segment and dips intramyocardially in the mid LAD without systolic muscle bridging; there is mild 20% proximal circumflex stenosis with 40% narrowing and a small first marginal branch ostially.  RCA is a very large dominant vessel with coronary calcification and sharp  angulation proximally and in its mid segment.  There is 70 -80% stenosis proximal to mid segment with TIMI-3 flow and mild 20% distal RCA stenoses.   LV dysfunction with EF estimate at 50% with mid to basal inferior hypocontractility.  LVEDP elevated at 28 mm.   Very difficult but successful percutaneous coronary intervention involving the calcified sharp angled proximal to mid RCA with ultimate PTCA, and tandem stenting necessitating Guideliner support ultimate insertion of a 3.5 x 20 mm Synergy and 3.0 x 12 mm Synergy stents postdilated to 3.6 to 3.5 mm with the region being reduced to 0%.   05-16-19: 2-d echo:   1. Left ventricular ejection fraction, by visual estimation, is 55 to 60%. The left ventricle has normal function. Left ventricular septal wall thickness was mildly increased. There is mildly increased left ventricular hypertrophy.  2. Inferior basal hypokinesis but overal EF preserved.  3. Global right ventricle has normal systolic function.The right ventricular size is normal. No increase in right ventricular wall thickness.  4. Left atrial size was normal.  5. Right atrial size was mildly dilated.  6. Moderate calcification of the mitral valve leaflet(s).  7. Moderate mitral annular calcification.  8. Moderate thickening of the mitral valve leaflet(s).  9. The mitral valve is normal in structure. No evidence of mitral valve regurgitation. No evidence of mitral stenosis. 10. The tricuspid valve is normal in structure. Tricuspid valve regurgitation is mild. 11. The aortic valve is tricuspid Aortic valve regurgitation was not visualized by color flow Doppler. Mild to moderate aortic valve sclerosis/calcification without any evidence of aortic stenosis. 12. The pulmonic valve was not well visualized. Pulmonic valve regurgitation was not assessed by color flow Doppler. 13. Post pericardial window with no tamponade or residual effuson. 14. The interatrial septum was not well  visualized.  05-24-19: chest x-ray:  Small layering pleural effusions. Enlarged cardiopericardial silhouette compatible with pericardial effusion as seen on prior CT. Emphysema.  05-24-19: abdominal ultrasound 1. No sonographic finding to explain the patient's leukocytosis and elevated LFTs. 2. Small amount of biliary sludge. No evidence of acute gallbladder inflammation.  3.  Hepatic cysts.  NO NEW EXAMS.   LABS REVIEWED PREVIOUS  05-10-19: wbc 22.2; hgb 13.9; hct 46.3; mcv 98.7 plt 320; glucose 195; bun 27; creat 0.78; k+ 4.3; na++ 138; ca 8.6; liver normal albumin 4.0; blood culture: no growth BNP 211.0 05-11-19: wbc 10.3; hgb 13.4; hct 41.5; mcv 94.3; plt 169; glucose 141; bun 24; creat 0.89; k+ 4.7; na++ 141; ca 9.1; BNP 1643.0 05-15-19: wbc 20.1; hgb 12.2; hct 37.6; mcv 90.2 ;plt 81; glucose 175 bun 28; creat 0.89; k+ 4.5; na++ 140; ca 6.5  05-19-19: wbc 10.'6 hgb 9.5; hct 29.5; mcv 92.5; plt 121; glucose 121; bun 21; creat 0.75; k+ 3.5; na++ 139; ca 7.9 05-24-19: wbc 12.1; hgb 10.9'; h ct 34.0; mcv 95.5; plt 268; glucose 106; bun 19; creat 0.84; k+ 3.6; na++ 138; ca 8.4; ast 52; alt 155; total bili 0.3; albumin 2.5 tsh 4.306 urine culture: multiple 05-27-19: wbc 13.1; hgb 10.8; hct 33.7; mcv 94.4 plt 249; glucose 106; bun 18; creat 0.80; k+ 4.2; na++ 136; ca 8.4; ast 29; alt 85; total bili 1.0; albumin 2.6 pre-albumin 17.7 05-28-19: free T3: 1.1; free T4: 1.21  05-31-19: tsh 7.685  NO NEW LABS.   Review of Systems  Constitutional: Negative for malaise/fatigue.  Respiratory: Negative for cough and shortness of breath.   Cardiovascular: Negative for chest pain, palpitations and leg swelling.  Gastrointestinal: Negative for abdominal pain, constipation and heartburn.  Musculoskeletal: Negative for back pain, joint pain and myalgias.  Skin: Negative.   Neurological: Negative for dizziness.       Has right side head ache   Psychiatric/Behavioral: The patient is not nervous/anxious.      Physical Exam Constitutional:      General: She is not in acute distress.    Appearance: She is well-developed. She is not diaphoretic.  Eyes:     Conjunctiva/sclera: Conjunctivae normal.     Pupils: Pupils are equal, round, and reactive to light.     Comments:  History of bilateral cataract removal   Neck:     Musculoskeletal: Neck supple.     Thyroid: No thyromegaly.  Cardiovascular:     Rate and Rhythm: Normal rate and regular rhythm.     Pulses: Normal pulses.     Heart sounds: Normal heart sounds.     Comments: Status post cardiac stent Pulmonary:     Effort: Pulmonary effort is normal. No respiratory distress.     Breath sounds: Normal breath sounds.  Abdominal:     General: Bowel sounds are normal. There is no distension.     Palpations: Abdomen is soft.     Tenderness: There is no abdominal tenderness.  Musculoskeletal:     Right lower leg: No edema.     Left lower leg: No edema.     Comments: Is able to move all extremities  Right neck is tight to palpitation; headache improves with massage   Lymphadenopathy:     Cervical: No cervical adenopathy.  Skin:    General: Skin is warm and dry.     Comments: Chest incision line without signs of infection    Neurological:     Mental Status: She is alert and oriented to person, place, and time.     Cranial Nerves: No cranial nerve deficit.      ASSESSMENT/ PLAN:  TODAY;   1. Acute tensions type headache non intractable: is worse: will begin tylenol 1 gm three times daily will monitor her status; therapy will see for  right neck tightness   MD is aware of resident's narcotic use and is in agreement with current plan of care. We will attempt to wean resident as appropriate.  Synthia Innocent NP [facility]  Contact [phone] Monday through Friday 8am- 5pm  After hours call [phone]

## 2019-06-10 NOTE — Patient Instructions (Addendum)
Medication Instructions:  Your physician recommends that you continue on your current medications as directed. Please refer to the Current Medication list given to you today.  *If you need a refill on your cardiac medications before your next appointment, please call your pharmacy*  Lab Work: None today  If you have labs (blood work) drawn today and your tests are completely normal, you will receive your results only by: Marland Kitchen MyChart Message (if you have MyChart) OR . A paper copy in the mail If you have any lab test that is abnormal or we need to change your treatment, we will call you to review the results.  Testing/Procedures: Your physician has recommended that you wear an event monitor for 14 days. Event monitors are medical devices that record the heart's electrical activity. Doctors most often [place] these monitors to diagnose arrhythmias. Arrhythmias are problems with the speed or rhythm of the heartbeat. The monitor is a small, portable device. You can wear one while you do your normal daily activities. This is usually used to diagnose what is causing palpitations/syncope (passing out).    Follow-Up: At [facility], you and your health needs are our priority.  As part of our continuing mission to provide you with exceptional heart care, we have created designated Provider Care Teams.  These Care Teams include your primary Cardiologist (physician) and Advanced Practice Providers (APPs -  Physician Assistants and Nurse Practitioners) who all work together to provide you with the care you need, when you need it.  Your next appointment:    5-6 weeks with Dr. Harl Bowie   The format for your next appointment:   Virtual Visit   Provider:   Carlyle Dolly, MD  Other Instructions  The cardiac monitor will be mailed to the [facility]

## 2019-06-11 ENCOUNTER — Encounter: Payer: Self-pay | Admitting: Adult Health

## 2019-06-11 ENCOUNTER — Ambulatory Visit: Payer: Self-pay | Admitting: Nurse Practitioner

## 2019-06-11 ENCOUNTER — Non-Acute Institutional Stay (SKILLED_NURSING_FACILITY): Payer: Medicare Other | Admitting: Adult Health

## 2019-06-11 DIAGNOSIS — I214 Non-ST elevation (NSTEMI) myocardial infarction: Secondary | ICD-10-CM | POA: Diagnosis not present

## 2019-06-11 DIAGNOSIS — I230 Hemopericardium as current complication following acute myocardial infarction: Secondary | ICD-10-CM | POA: Diagnosis not present

## 2019-06-11 DIAGNOSIS — I480 Paroxysmal atrial fibrillation: Secondary | ICD-10-CM | POA: Diagnosis not present

## 2019-06-11 DIAGNOSIS — I5041 Acute combined systolic (congestive) and diastolic (congestive) heart failure: Secondary | ICD-10-CM

## 2019-06-11 NOTE — Progress Notes (Signed)
Location:    [facility] Room Number: 156/W Place of Service:  SNF (31)   CODE STATUS: DNR  Allergies  Allergen Reactions  . Crestor [Rosuvastatin Calcium] Other (See Comments)    Cramping/pain in lower extremeties  . Niaspan [Niacin Er] Other (See Comments)    Cramping/pain in lower extremeties   Chief Complaint  Patient presents with  . Medical Management of Chronic Issues         NSTEMI (non st elevation myocardial infarction) hemopericardium after myocardial infarction:  Acute combined systolic and diastolic HF (heart failure)  . Paroxysmal atrial fibrillation:   Weekly follow up for the first 30 days post hospitalization     HPI:  She is a 85 year old short term rehab patient being seen for the management of her chronic illnesses; nstemi; afib; heart failure. She denies any shortness of breath; no chest pain; no cough no lower extremity edema. She continues to participate in therapy.   Past Medical History:  Diagnosis Date  . Cataract   . COPD (chronic obstructive pulmonary disease) (HCC)   . Dysphagia 05/31/2019  . GERD (gastroesophageal reflux disease)   . Hypertension   . Osteopenia   . Thyroid disease     Past Surgical History:  Procedure Laterality Date  . CATARACT EXTRACTION, BILATERAL    . CORONARY STENT INTERVENTION N/A 05/14/2019   Procedure: CORONARY STENT INTERVENTION;  Surgeon: Lennette Bihari, MD;  Location: [facility] INVASIVE CV LAB;  Service: Cardiovascular;  Laterality: N/A;  . LEFT HEART CATH AND CORONARY ANGIOGRAPHY N/A 05/14/2019   Procedure: LEFT HEART CATH AND CORONARY ANGIOGRAPHY;  Surgeon: Marykay Lex, MD;  Location: [facility] INVASIVE CV LAB;  Service: Cardiovascular;  Laterality: N/A;  . LEFT HEART CATH AND CORONARY ANGIOGRAPHY N/A 05/14/2019   Procedure: LEFT HEART CATH AND CORONARY ANGIOGRAPHY;  Surgeon: Lennette Bihari, MD;  Location: [facility] INVASIVE CV LAB;  Service: Cardiovascular;  Laterality: N/A;  . MEDIASTINAL EXPLORATION N/A  05/14/2019   Procedure: Mediastinal Exploration with Washout;  Surgeon: Corliss Skains, MD;  Location: [facility] OR;  Service: Open Heart Surgery;  Laterality: N/A;    Social History   Socioeconomic History  . Marital status: Married    Spouse name: Not on file  . Number of children: Not on file  . Years of education: Not on file  . Highest education level: Not on file  Occupational History  . Not on file  Social Needs  . Financial resource strain: Not on file  . Food insecurity    Worry: Not on file    Inability: Not on file  . Transportation needs    Medical: Not on file    Non-medical: Not on file  Tobacco Use  . Smoking status: Former Smoker    Quit date: 08/15/1994    Years since quitting: 24.8  . Smokeless tobacco: Former Neurosurgeon    Types: Chew    Quit date: 08/15/1998  Substance and Sexual Activity  . Alcohol use: No  . Drug use: No  . Sexual activity: Not Currently  Lifestyle  . Physical activity    Days per week: Not on file    Minutes per session: Not on file  . Stress: Not on file  Relationships  . Social Musician on phone: Not on file    Gets together: Not on file    Attends religious service: Not on file    Active member of club or organization: Not on  file    Attends meetings of clubs or organizations: Not on file    Relationship status: Not on file  . Intimate partner violence    Fear of current or ex partner: Not on file    Emotionally abused: Not on file    Physically abused: Not on file    Forced sexual activity: Not on file  Other Topics Concern  . Not on file  Social History Narrative  . Not on file   Family History  Problem Relation Age of Onset  . Diabetes Mother   . Cancer Brother   . Tuberculosis Father   . Early death Sister   . Early death Brother   . COPD Brother   . Emphysema Brother   . Early death Sister   . COPD Sister   . Emphysema Sister   . Aneurysm Sister   . Stroke Sister   . Cancer Sister   . Hypertension  Son   . Hypertension Daughter   . Fibromyalgia Daughter   . Hypertension Daughter       VITAL SIGNS BP 96/61   Pulse (!) 55   Temp 97.8 F (36.6 C) (Oral)   Resp 16   Ht 4\' 11"  (1.499 m)   Wt 117 lb 6.4 oz (53.3 kg)   SpO2 96%   BMI 23.71 kg/m   Outpatient Encounter Medications as of 06/11/2019  Medication Sig  . acetaminophen (TYLENOL) 500 MG tablet Take 500 mg by mouth 3 (three) times daily.  Marland Kitchen. amiodarone (PACERONE) 100 MG tablet Take 1 tablet (100 mg total) by mouth every Tuesday, Thursday, Saturday, and Sunday.  Marland Kitchen. amiodarone (PACERONE) 200 MG tablet Take 1 tablet (200 mg total) by mouth every Monday, Wednesday, and Friday.  Marland Kitchen. apixaban (ELIQUIS) 2.5 MG TABS tablet Take 1 tablet (2.5 mg total) by mouth 2 (two) times daily.  Marland Kitchen. atorvastatin (LIPITOR) 40 MG tablet Take 1 tablet (40 mg total) by mouth daily at 6 PM.  . bisacodyl (DULCOLAX) 10 MG suppository Place 1 suppository (10 mg total) rectally daily as needed for moderate constipation.  . calcium-vitamin D 250-100 MG-UNIT per tablet Take 1 tablet by mouth 2 (two) times daily.   . clopidogrel (PLAVIX) 75 MG tablet Take 1 tablet (75 mg total) by mouth daily.  . diphenhydrAMINE-zinc acetate (BENADRYL) cream Apply topically 2 (two) times daily as needed for itching.  . feeding supplement, ENSURE ENLIVE, (ENSURE ENLIVE) LIQD Take 237 mLs by mouth 3 (three) times daily between meals.  . furosemide (LASIX) 20 MG tablet Take 1 tablet (20 mg total) by mouth daily.  Marland Kitchen. guaiFENesin (MUCINEX) 600 MG 12 hr tablet Take 1 tablet (600 mg total) by mouth 2 (two) times daily.  Marland Kitchen. levothyroxine (SYNTHROID) 75 MCG tablet Take 1 tablet (75 mcg total) by mouth daily.  Marland Kitchen. loratadine (CLARITIN) 10 MG tablet Take 1 tablet (10 mg total) by mouth daily.  . mometasone-formoterol (DULERA) 200-5 MCG/ACT AERO Inhale 2 puffs into the lungs 2 (two) times daily.  . Multiple Vitamin (MULTIVITAMIN WITH MINERALS) TABS tablet Take 1 tablet by mouth daily.  . NON  FORMULARY Diet change: Dysphagia 3, thin liquids; will allow cereal for breakfast  . pantoprazole (PROTONIX) 40 MG tablet Take 1 tablet (40 mg total) by mouth 2 (two) times daily.  . polyethylene glycol (MIRALAX / GLYCOLAX) packet Take 17 g by mouth daily.  . Vitamin D, Cholecalciferol, 25 MCG (1000 UT) TABS Take 1 tablet by mouth daily.  . [DISCONTINUED] acetaminophen (TYLENOL) 325  MG tablet Take 2 tablets (650 mg total) by mouth every 6 (six) hours as needed for mild pain or headache. (Patient taking differently: Take 650 mg by mouth 3 (three) times daily. )   No facility-administered encounter medications on file as of 06/11/2019.      SIGNIFICANT DIAGNOSTIC EXAMS   PREVIOUS;   05-10-19: ct angio of chest:  1. Negative for acute PE or thoracic aortic dissection. 2. Coronary and Aortic Atherosclerosis   05-10-19: ct of soft tissue of neck:  No acute abnormality Atherosclerotic calcification of the carotid arteries bilaterally. Arterial stricture occlusion not excluded on this study without intravenous contrast Airway intact.   05-14-19: ct angio of chest abdomen and pelvis: 1. No CT evidence of aortic dissection or aneurysm.  No hematoma. 2. High attenuating pericardial effusion most consistent with hemopericardium. 3. Coronary vascular calcification. 4. Colonic diverticulosis. No bowel obstruction or active inflammation. Normal appendix. 5. Hyperenhancement of the renal parenchyma concerning for a degree of renal dysfunction. Clinical correlation is recommended. 6. Aortic Atherosclerosis and Emphysema     05-14-19: ct of abdomen and pelvis:  1. No acute intra-abdominal or pelvic pathology. No intraperitoneal or retroperitoneal hematoma. 2. Partially visualized hemopericardium. Clinical correlation is recommended. 3. Small pocket of air within the upper IVC, iatrogenic. 4. Sigmoid diverticulosis. 5. Advanced aortic Atherosclerosis   05-14-19: cardiac catheterization Coronary  artery obstructive disease with mild coronary calcification.  The LAD has 20% mild stenosis in its proximal to mid segment and dips intramyocardially in the mid LAD without systolic muscle bridging; there is mild 20% proximal circumflex stenosis with 40% narrowing and a small first marginal branch ostially.  RCA is a very large dominant vessel with coronary calcification and sharp angulation proximally and in its mid segment.  There is 70 -80% stenosis proximal to mid segment with TIMI-3 flow and mild 20% distal RCA stenoses.   LV dysfunction with EF estimate at 50% with mid to basal inferior hypocontractility.  LVEDP elevated at 28 mm.   Very difficult but successful percutaneous coronary intervention involving the calcified sharp angled proximal to mid RCA with ultimate PTCA, and tandem stenting necessitating Guideliner support ultimate insertion of a 3.5 x 20 mm Synergy and 3.0 x 12 mm Synergy stents postdilated to 3.6 to 3.5 mm with the region being reduced to 0%.   05-16-19: 2-d echo:   1. Left ventricular ejection fraction, by visual estimation, is 55 to 60%. The left ventricle has normal function. Left ventricular septal wall thickness was mildly increased. There is mildly increased left ventricular hypertrophy.  2. Inferior basal hypokinesis but overal EF preserved.  3. Global right ventricle has normal systolic function.The right ventricular size is normal. No increase in right ventricular wall thickness.  4. Left atrial size was normal.  5. Right atrial size was mildly dilated.  6. Moderate calcification of the mitral valve leaflet(s).  7. Moderate mitral annular calcification.  8. Moderate thickening of the mitral valve leaflet(s).  9. The mitral valve is normal in structure. No evidence of mitral valve regurgitation. No evidence of mitral stenosis. 10. The tricuspid valve is normal in structure. Tricuspid valve regurgitation is mild. 11. The aortic valve is tricuspid Aortic valve  regurgitation was not visualized by color flow Doppler. Mild to moderate aortic valve sclerosis/calcification without any evidence of aortic stenosis. 12. The pulmonic valve was not well visualized. Pulmonic valve regurgitation was not assessed by color flow Doppler. 13. Post pericardial window with no tamponade or residual effuson. 14. The interatrial  septum was not well visualized.  05-24-19: chest x-ray:  Small layering pleural effusions. Enlarged cardiopericardial silhouette compatible with pericardial effusion as seen on prior CT. Emphysema.  05-24-19: abdominal ultrasound 1. No sonographic finding to explain the patient's leukocytosis and elevated LFTs. 2. Small amount of biliary sludge. No evidence of acute gallbladder inflammation. 3.  Hepatic cysts.  NO NEW EXAMS.   LABS REVIEWED PREVIOUS  05-10-19: wbc 22.2; hgb 13.9; hct 46.3; mcv 98.7 plt 320; glucose 195; bun 27; creat 0.78; k+ 4.3; na++ 138; ca 8.6; liver normal albumin 4.0; blood culture: no growth BNP 211.0 05-11-19: wbc 10.3; hgb 13.4; hct 41.5; mcv 94.3; plt 169; glucose 141; bun 24; creat 0.89; k+ 4.7; na++ 141; ca 9.1; BNP 1643.0 05-15-19: wbc 20.1; hgb 12.2; hct 37.6; mcv 90.2 ;plt 81; glucose 175 bun 28; creat 0.89; k+ 4.5; na++ 140; ca 6.5  05-19-19: wbc 10.'6 hgb 9.5; hct 29.5; mcv 92.5; plt 121; glucose 121; bun 21; creat 0.75; k+ 3.5; na++ 139; ca 7.9 05-24-19: wbc 12.1; hgb 10.9'; h ct 34.0; mcv 95.5; plt 268; glucose 106; bun 19; creat 0.84; k+ 3.6; na++ 138; ca 8.4; ast 52; alt 155; total bili 0.3; albumin 2.5 tsh 4.306 urine culture: multiple 05-27-19: wbc 13.1; hgb 10.8; hct 33.7; mcv 94.4 plt 249; glucose 106; bun 18; creat 0.80; k+ 4.2; na++ 136; ca 8.4; ast 29; alt 85; total bili 1.0; albumin 2.6 pre-albumin 17.7 05-28-19: free T3: 1.1; free T4: 1.21  05-31-19: tsh 7.685  NO NEW LABS.   Review of Systems  Constitutional: Negative for malaise/fatigue.  Respiratory: Negative for cough and shortness of breath.    Cardiovascular: Negative for chest pain, palpitations and leg swelling.  Gastrointestinal: Negative for abdominal pain, constipation and heartburn.  Musculoskeletal: Negative for back pain, joint pain and myalgias.  Skin: Negative.   Neurological: Negative for dizziness.  Psychiatric/Behavioral: The patient is not nervous/anxious.     Physical Exam Constitutional:      General: She is not in acute distress.    Appearance: She is well-developed. She is not diaphoretic.  Eyes:     Comments:  History of bilateral cataract removal    Neck:     Musculoskeletal: Neck supple.     Thyroid: No thyromegaly.  Cardiovascular:     Rate and Rhythm: Normal rate and regular rhythm.     Pulses: Normal pulses.     Heart sounds: Normal heart sounds.     Comments: Status post cardiac stent Pulmonary:     Effort: Pulmonary effort is normal. No respiratory distress.     Breath sounds: Normal breath sounds.  Abdominal:     General: Bowel sounds are normal. There is no distension.     Palpations: Abdomen is soft.     Tenderness: There is no abdominal tenderness.  Musculoskeletal:     Right lower leg: No edema.     Left lower leg: No edema.     Comments: Is able to move all extremities  Right neck is tight to palpitation;   Lymphadenopathy:     Cervical: No cervical adenopathy.  Skin:    General: Skin is warm and dry.  Neurological:     Mental Status: She is alert and oriented to person, place, and time.  Psychiatric:        Mood and Affect: Mood normal.       ASSESSMENT/ PLAN:  TODAY;   1. NSTEMI (non st elevation myocardial infarction) hemopericardium after myocardial infarction: is status post  evacuation of hemopericardium: is stable will continue plavix 75 mg daily will continue to monitor her status.   2. 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   3. Paroxysmal atrial fibrillation: heart  rate is stable is on alternating doses of amiodarone 100/200 mg daily for rate control; is on long term anticoagulation therapy eliquis 2.5 mg twice daily   PREVIOUS   4. Simple chronic bronchitis/acute respiratory failure with hypoxia: is stable will continue dulera 100/5 mcg 2 puffs twice daily ; mucinex 600 mg twice daily claritin 10 mg daily   5. Gastroesophageal reflux disease without esophagitis: is stable will continue protonix 40 mg twice daily   6. Oral phase dysphagia: is stable no signs of aspiration present; is on thin liquids  7. Other specified hypothyroidism: is stable tsh 7.685 will continue synthroid 75 mcg daily   8. Mixed hyperlipidemia: is stable will continue lipitor 40 mg daily   9. Acute blood loss anemia transfused 3 units RBC: is stable hgb 10.8 will monitor  10. Chronic constipation: is stable will continue miralax daily   11. Protein calorie malnutrition severe: is stable pre-albumin 17.7 albumin 2.6 will continue supplements as directed.   12. Tension type headache: is stable will continue tylenol 1 gm three times daily    MD is aware of resident's narcotic use and is in agreement with current plan of care. We will attempt to wean resident as appropriate.  Synthia Innocent NP [facility]  Contact [phone] Monday through Friday 8am- 5pm  After hours call [phone]

## 2019-06-12 ENCOUNTER — Other Ambulatory Visit: Payer: Self-pay | Admitting: Adult Health

## 2019-06-12 ENCOUNTER — Non-Acute Institutional Stay (SKILLED_NURSING_FACILITY): Payer: Medicare Other | Admitting: Adult Health

## 2019-06-12 ENCOUNTER — Other Ambulatory Visit: Payer: Self-pay | Admitting: Thoracic Surgery (Cardiothoracic Vascular Surgery)

## 2019-06-12 DIAGNOSIS — J441 Chronic obstructive pulmonary disease with (acute) exacerbation: Secondary | ICD-10-CM | POA: Diagnosis not present

## 2019-06-12 DIAGNOSIS — I230 Hemopericardium as current complication following acute myocardial infarction: Secondary | ICD-10-CM

## 2019-06-12 DIAGNOSIS — I214 Non-ST elevation (NSTEMI) myocardial infarction: Secondary | ICD-10-CM | POA: Diagnosis not present

## 2019-06-12 DIAGNOSIS — E039 Hypothyroidism, unspecified: Secondary | ICD-10-CM

## 2019-06-12 DIAGNOSIS — I5041 Acute combined systolic (congestive) and diastolic (congestive) heart failure: Secondary | ICD-10-CM

## 2019-06-12 DIAGNOSIS — I314 Cardiac tamponade: Secondary | ICD-10-CM

## 2019-06-12 MED ORDER — LEVOTHYROXINE SODIUM 75 MCG PO TABS: 75.0000 ug | ORAL_TABLET | Freq: Every day | ORAL | 0 refills | Status: DC

## 2019-06-12 MED ORDER — APIXABAN 2.5 MG PO TABS: 2.5000 mg | ORAL_TABLET | Freq: Two times a day (BID) | ORAL | 0 refills | Status: DC

## 2019-06-12 MED ORDER — FUROSEMIDE 20 MG PO TABS: 20.0000 mg | ORAL_TABLET | Freq: Every day | ORAL | 0 refills | Status: DC

## 2019-06-12 MED ORDER — AMIODARONE HCL 100 MG PO TABS: 100.0000 mg | ORAL_TABLET | ORAL | 0 refills | Status: AC

## 2019-06-12 MED ORDER — AMIODARONE HCL 200 MG PO TABS: 200.0000 mg | ORAL_TABLET | ORAL | 0 refills | Status: DC

## 2019-06-12 MED ORDER — MOMETASONE FURO-FORMOTEROL FUM 200-5 MCG/ACT IN AERO: 2.0000 | INHALATION_SPRAY | Freq: Two times a day (BID) | RESPIRATORY_TRACT | 0 refills | Status: DC

## 2019-06-12 MED ORDER — CLOPIDOGREL BISULFATE 75 MG PO TABS: 75.0000 mg | ORAL_TABLET | Freq: Every day | ORAL | 0 refills | Status: DC

## 2019-06-12 MED ORDER — ATORVASTATIN CALCIUM 40 MG PO TABS: 40.0000 mg | ORAL_TABLET | Freq: Every day | ORAL | 0 refills | Status: DC

## 2019-06-12 MED ORDER — PANTOPRAZOLE SODIUM 40 MG PO TBEC: 40.0000 mg | DELAYED_RELEASE_TABLET | Freq: Two times a day (BID) | ORAL | 0 refills | Status: DC

## 2019-06-14 ENCOUNTER — Inpatient Hospital Stay
Admit: 2019-06-14 | Discharge: 2019-06-14 | Disposition: A | Discharge status: Home / Self Care | Payer: Medicare Other | Attending: Thoracic Surgery (Cardiothoracic Vascular Surgery) | Admitting: Thoracic Surgery (Cardiothoracic Vascular Surgery)

## 2019-06-14 ENCOUNTER — Encounter: Payer: Self-pay | Admitting: Thoracic Surgery (Cardiothoracic Vascular Surgery)

## 2019-06-14 ENCOUNTER — Ambulatory Visit (INDEPENDENT_AMBULATORY_CARE_PROVIDER_SITE_OTHER): Payer: Self-pay | Admitting: Thoracic Surgery (Cardiothoracic Vascular Surgery)

## 2019-06-14 VITALS — BP 145/77 | HR 85 | Temp 97.8°F | Resp 20 | Ht 59.0 in | Wt 117.0 lb

## 2019-06-14 DIAGNOSIS — I314 Cardiac tamponade: Secondary | ICD-10-CM

## 2019-06-14 DIAGNOSIS — R0602 Shortness of breath: Secondary | ICD-10-CM | POA: Diagnosis not present

## 2019-06-14 DIAGNOSIS — I312 Hemopericardium, not elsewhere classified: Secondary | ICD-10-CM

## 2019-06-14 DIAGNOSIS — Z09 Encounter for follow-up examination after completed treatment for conditions other than malignant neoplasm: Secondary | ICD-10-CM

## 2019-06-14 DIAGNOSIS — G44209 Tension-type headache, unspecified, not intractable: Secondary | ICD-10-CM | POA: Insufficient documentation

## 2019-06-14 NOTE — Progress Notes (Signed)
      [address]       [place], [address]             [phone]        Avalyn M Stabler [place] Medical Record #[id number] Date of Birth: 05-22-1928  Referring: Leonie Man, MD Primary Care: Chevis Pretty, [name] Primary Cardiologist:Branch, Roderic Palau, MD  Reason for visit:   follow-up  History of Present Illness:     85 year old female presents for her first follow-up urgent sternotomy evacuation of hematoma secondary to coronary dissection.  She was discharged inpatient rehab today.  Given her age she has recovered well from procedure.  Physical Exam: BP (!) 145/77   Pulse 85   Temp 97.8 F (36.6 C) (Skin)   Resp 20   Ht 4\' 11"  (1.499 m)   Wt 117 lb (53.1 kg)   SpO2 95% Comment: RA  BMI 23.63 kg/m   Alert NAD Incision well-healed.  Sternum stable Bruising along the right inner thigh.  Diagnostic Studies & Laboratory data: CXR: Clear     Assessment / Plan:   85 year old female status post emergent sternotomy evacuation of mediastinal hematoma following an iatrogenic coronary dissection.  Doing well.  Cleared for cardiac rehab. Follow-up as needed.   Lajuana Matte 06/14/2019 1:31 PM

## 2019-06-15 ENCOUNTER — Encounter: Payer: Self-pay | Admitting: Adult Health

## 2019-06-15 NOTE — Progress Notes (Signed)
Location:   [facility] Room Number: 666 Place of Service:  SNF (31)    CODE STATUS: full code   Allergies  Allergen Reactions  . Crestor [Rosuvastatin Calcium] Other (See Comments)    Cramping/pain in lower extremeties  . Niaspan [Niacin Er] Other (See Comments)    Cramping/pain in lower extremeties    Chief Complaint  Patient presents with  . Discharge Note    HPI:  She is being discharged to home with home health for pt/ot. She will not need any dme. She will need her prescriptions written and will need to follow up with her medical provider. She was hospitalized for a NSTEMI and hemopericardium. She was admitted to this facility for short term rehab. She has participated in therapy. She is ready to complete therapy on a home health basis.     Past Medical History:  Diagnosis Date  . Cataract   . COPD (chronic obstructive pulmonary disease) (HCC)   . Dysphagia 05/31/2019  . GERD (gastroesophageal reflux disease)   . Hypertension   . Osteopenia   . Thyroid disease     Past Surgical History:  Procedure Laterality Date  . CATARACT EXTRACTION, BILATERAL    . CORONARY STENT INTERVENTION N/A 05/14/2019   Procedure: CORONARY STENT INTERVENTION;  Surgeon: Lennette BihariKelly, Thomas A, MD;  [facility];  Service: Cardiovascular;  Laterality: N/A;  . LEFT HEART CATH AND CORONARY ANGIOGRAPHY N/A 05/14/2019   Procedure: LEFT HEART CATH AND CORONARY ANGIOGRAPHY;  Surgeon: Marykay LexHarding, David W, MD;  [facility];  Service: Cardiovascular;  Laterality: N/A;  . LEFT HEART CATH AND CORONARY ANGIOGRAPHY N/A 05/14/2019   Procedure: LEFT HEART CATH AND CORONARY ANGIOGRAPHY;  Surgeon: Lennette BihariKelly, Thomas A, MD;  Location: [facility] INVASIVE CV LAB;  Service: Cardiovascular;  Laterality: N/A;  . MEDIASTINAL EXPLORATION N/A 05/14/2019   Procedure: Mediastinal Exploration with Washout;  Surgeon: Corliss SkainsLightfoot, Harrell O, MD;  Location: [facility] OR;  Service: Open Heart Surgery;   Laterality: N/A;    Social History   Socioeconomic History  . Marital status: Married    Spouse name: Not on file  . Number of children: Not on file  . Years of education: Not on file  . Highest education level: Not on file  Occupational History  . Not on file  Social Needs  . Financial resource strain: Not on file  . Food insecurity    Worry: Not on file    Inability: Not on file  . Transportation needs    Medical: Not on file    Non-medical: Not on file  Tobacco Use  . Smoking status: Former Smoker    Quit date: 08/15/1994    Years since quitting: 24.8  . Smokeless tobacco: Former NeurosurgeonUser    Types: Chew    Quit date: 08/15/1998  Substance and Sexual Activity  . Alcohol use: No  . Drug use: No  . Sexual activity: Not Currently  Lifestyle  . Physical activity    Days per week: Not on file    Minutes per session: Not on file  . Stress: Not on file  Relationships  . Social Musicianconnections    Talks on phone: Not on file    Gets together: Not on file    Attends religious service: Not on file    Active member of club or organization: Not on file    Attends meetings of clubs or organizations: Not on file    Relationship status: Not on  file  . Intimate partner violence    Fear of current or ex partner: Not on file    Emotionally abused: Not on file    Physically abused: Not on file    Forced sexual activity: Not on file  Other Topics Concern  . Not on file  Social History Narrative  . Not on file   Family History  Problem Relation Age of Onset  . Diabetes Mother   . Cancer Brother   . Tuberculosis Father   . Early death Sister   . Early death Brother   . COPD Brother   . Emphysema Brother   . Early death Sister   . COPD Sister   . Emphysema Sister   . Aneurysm Sister   . Stroke Sister   . Cancer Sister   . Hypertension Son   . Hypertension Daughter   . Fibromyalgia Daughter   . Hypertension Daughter     VITAL SIGNS BP (!) 145/78   Pulse 79   Temp 97.6 F  (36.4 C)   Resp 16   Ht  (1.499 m)   Wt 117 lb (53.1 kg)   BMI 23.63 kg/m   Patient's Medications  New Prescriptions   No medications on file  Previous Medications   ACETAMINOPHEN (TYLENOL) 500 MG TABLET    Take 500 mg by mouth 3 (three) times daily.   AMIODARONE (PACERONE) 100 MG TABLET    Take 1 tablet (100 mg total) by mouth every Tuesday, Thursday, Saturday, and Sunday.   AMIODARONE (PACERONE) 200 MG TABLET    Take 1 tablet (200 mg total) by mouth every Monday, Wednesday, and Friday.   APIXABAN (ELIQUIS) 2.5 MG TABS TABLET    Take 1 tablet (2.5 mg total) by mouth 2 (two) times daily.   ATORVASTATIN (LIPITOR) 40 MG TABLET    Take 1 tablet (40 mg total) by mouth daily at 6 PM.   BISACODYL (DULCOLAX) 10 MG SUPPOSITORY    Place 1 suppository (10 mg total) rectally daily as needed for moderate constipation.   CALCIUM-VITAMIN D 250-100 MG-UNIT PER TABLET    Take 1 tablet by mouth 2 (two) times daily.    CLOPIDOGREL (PLAVIX) 75 MG TABLET    Take 1 tablet (75 mg total) by mouth daily.   DIPHENHYDRAMINE-ZINC ACETATE (BENADRYL) CREAM    Apply topically 2 (two) times daily as needed for itching.   FEEDING SUPPLEMENT, ENSURE ENLIVE, (ENSURE ENLIVE) LIQD    Take 237 mLs by mouth 3 (three) times daily between meals.   FUROSEMIDE (LASIX) 20 MG TABLET    Take 1 tablet (20 mg total) by mouth daily.   GUAIFENESIN (MUCINEX) 600 MG 12 HR TABLET    Take 1 tablet (600 mg total) by mouth 2 (two) times daily.   LEVOTHYROXINE (SYNTHROID) 75 MCG TABLET    Take 1 tablet (75 mcg total) by mouth daily.   LORATADINE (CLARITIN) 10 MG TABLET    Take 1 tablet (10 mg total) by mouth daily.   MOMETASONE-FORMOTEROL (DULERA) 200-5 MCG/ACT AERO    Inhale 2 puffs into the lungs 2 (two) times daily.   MULTIPLE VITAMIN (MULTIVITAMIN WITH MINERALS) TABS TABLET    Take 1 tablet by mouth daily.   NON FORMULARY    Diet change: Dysphagia 3, thin liquids; will allow cereal for breakfast   PANTOPRAZOLE (PROTONIX) 40 MG  TABLET    Take 1 tablet (40 mg total) by mouth 2 (two) times daily.   POLYETHYLENE GLYCOL (MIRALAX /  GLYCOLAX) PACKET    Take 17 g by mouth daily.   VITAMIN D, CHOLECALCIFEROL, 25 MCG (1000 UT) TABS    Take 1 tablet by mouth daily.  Modified Medications   No medications on file  Discontinued Medications   No medications on file     SIGNIFICANT DIAGNOSTIC EXAMS   PREVIOUS;   05-10-19: ct angio of chest:  1. Negative for acute PE or thoracic aortic dissection. 2. Coronary and Aortic Atherosclerosis   05-10-19: ct of soft tissue of neck:  No acute abnormality Atherosclerotic calcification of the carotid arteries bilaterally. Arterial stricture occlusion not excluded on this study without intravenous contrast Airway intact.   05-14-19: ct angio of chest abdomen and pelvis: 1. No CT evidence of aortic dissection or aneurysm.  No hematoma. 2. High attenuating pericardial effusion most consistent with hemopericardium. 3. Coronary vascular calcification. 4. Colonic diverticulosis. No bowel obstruction or active inflammation. Normal appendix. 5. Hyperenhancement of the renal parenchyma concerning for a degree of renal dysfunction. Clinical correlation is recommended. 6. Aortic Atherosclerosis and Emphysema     05-14-19: ct of abdomen and pelvis:  1. No acute intra-abdominal or pelvic pathology. No intraperitoneal or retroperitoneal hematoma. 2. Partially visualized hemopericardium. Clinical correlation is recommended. 3. Small pocket of air within the upper IVC, iatrogenic. 4. Sigmoid diverticulosis. 5. Advanced aortic Atherosclerosis   05-14-19: cardiac catheterization Coronary artery obstructive disease with mild coronary calcification.  The LAD has 20% mild stenosis in its proximal to mid segment and dips intramyocardially in the mid LAD without systolic muscle bridging; there is mild 20% proximal circumflex stenosis with 40% narrowing and a small first marginal branch ostially.  RCA  is a very large dominant vessel with coronary calcification and sharp angulation proximally and in its mid segment.  There is 70 -80% stenosis proximal to mid segment with TIMI-3 flow and mild 20% distal RCA stenoses.   LV dysfunction with EF estimate at 50% with mid to basal inferior hypocontractility.  LVEDP elevated at 28 mm.   Very difficult but successful percutaneous coronary intervention involving the calcified sharp angled proximal to mid RCA with ultimate PTCA, and tandem stenting necessitating Guideliner support ultimate insertion of a 3.5 x 20 mm Synergy and 3.0 x 12 mm Synergy stents postdilated to 3.6 to 3.5 mm with the region being reduced to 0%.   05-16-19: 2-d echo:   1. Left ventricular ejection fraction, by visual estimation, is 55 to 60%. The left ventricle has normal function. Left ventricular septal wall thickness was mildly increased. There is mildly increased left ventricular hypertrophy.  2. Inferior basal hypokinesis but overal EF preserved.  3. Global right ventricle has normal systolic function.The right ventricular size is normal. No increase in right ventricular wall thickness.  4. Left atrial size was normal.  5. Right atrial size was mildly dilated.  6. Moderate calcification of the mitral valve leaflet(s).  7. Moderate mitral annular calcification.  8. Moderate thickening of the mitral valve leaflet(s).  9. The mitral valve is normal in structure. No evidence of mitral valve regurgitation. No evidence of mitral stenosis. 10. The tricuspid valve is normal in structure. Tricuspid valve regurgitation is mild. 11. The aortic valve is tricuspid Aortic valve regurgitation was not visualized by color flow Doppler. Mild to moderate aortic valve sclerosis/calcification without any evidence of aortic stenosis. 12. The pulmonic valve was not well visualized. Pulmonic valve regurgitation was not assessed by color flow Doppler. 13. Post pericardial window with no tamponade or  residual effuson. 14.  The interatrial septum was not well visualized.  05-24-19: chest x-ray:  Small layering pleural effusions. Enlarged cardiopericardial silhouette compatible with pericardial effusion as seen on prior CT. Emphysema.  05-24-19: abdominal ultrasound 1. No sonographic finding to explain the patient's leukocytosis and elevated LFTs. 2. Small amount of biliary sludge. No evidence of acute gallbladder inflammation. 3.  Hepatic cysts.  NO NEW EXAMS.   LABS REVIEWED PREVIOUS  05-10-19: wbc 22.2; hgb 13.9; hct 46.3; mcv 98.7 plt 320; glucose 195; bun 27; creat 0.78; k+ 4.3; na++ 138; ca 8.6; liver normal albumin 4.0; blood culture: no growth BNP 211.0 05-11-19: wbc 10.3; hgb 13.4; hct 41.5; mcv 94.3; plt 169; glucose 141; bun 24; creat 0.89; k+ 4.7; na++ 141; ca 9.1; BNP 1643.0 05-15-19: wbc 20.1; hgb 12.2; hct 37.6; mcv 90.2 ;plt 81; glucose 175 bun 28; creat 0.89; k+ 4.5; na++ 140; ca 6.5  05-19-19: wbc 10.'6 hgb 9.5; hct 29.5; mcv 92.5; plt 121; glucose 121; bun 21; creat 0.75; k+ 3.5; na++ 139; ca 7.9 05-24-19: wbc 12.1; hgb 10.9'; h ct 34.0; mcv 95.5; plt 268; glucose 106; bun 19; creat 0.84; k+ 3.6; na++ 138; ca 8.4; ast 52; alt 155; total bili 0.3; albumin 2.5 tsh 4.306 urine culture: multiple 05-27-19: wbc 13.1; hgb 10.8; hct 33.7; mcv 94.4 plt 249; glucose 106; bun 18; creat 0.80; k+ 4.2; na++ 136; ca 8.4; ast 29; alt 85; total bili 1.0; albumin 2.6 pre-albumin 17.7 05-28-19: free T3: 1.1; free T4: 1.21  05-31-19: tsh 7.685  NO NEW LABS.    Review of Systems  Constitutional: Negative for malaise/fatigue.  Respiratory: Negative for cough and shortness of breath.   Cardiovascular: Negative for chest pain, palpitations and leg swelling.  Gastrointestinal: Negative for abdominal pain, constipation and heartburn.  Musculoskeletal: Negative for back pain, joint pain and myalgias.  Skin: Negative.   Neurological: Negative for dizziness.  Psychiatric/Behavioral: The patient  is not nervous/anxious.     Physical Exam Constitutional:      General: She is not in acute distress.    Appearance: She is well-developed. She is not diaphoretic.  Eyes:     Comments: History of bilateral cataract removal     Neck:     Musculoskeletal: Neck supple.     Thyroid: No thyromegaly.  Cardiovascular:     Rate and Rhythm: Normal rate and regular rhythm.     Pulses: Normal pulses.     Heart sounds: Normal heart sounds.     Comments: Status post cardiac stent Pulmonary:     Effort: Pulmonary effort is normal. No respiratory distress.     Breath sounds: Normal breath sounds.  Abdominal:     General: Bowel sounds are normal. There is no distension.     Palpations: Abdomen is soft.     Tenderness: There is no abdominal tenderness.  Musculoskeletal:     Right lower leg: No edema.     Left lower leg: No edema.     Comments: Is able to move all extremities   Lymphadenopathy:     Cervical: No cervical adenopathy.  Skin:    General: Skin is warm and dry.  Neurological:     Mental Status: She is alert and oriented to person, place, and time.  Psychiatric:        Mood and Affect: Mood normal.       ASSESSMENT/ PLAN:   Patient is being discharged with the following home health services: pt/ot to evaluate and treat as indicated for gait balance  strength adl training.   Patient is being discharged with the following durable medical equipment:  None needed   Patient has been advised to f/u with their PCP in 1-2 weeks to bring them up to date on their rehab stay.  Social services at facility was responsible for arranging this appointment.  Pt was provided with a 30 day supply of prescriptions for medications and refills must be obtained from their PCP.  For controlled substances, a more limited supply may be provided adequate until PCP appointment only.  A 30 day supply of her prescription medications have been sent to [facility] pharmacy in [place]  Time spent with patient:  medications; dme; home health.    Synthia Innocent NP [facility]  Contact [phone] Monday through Friday 8am- 5pm  After hours call [phone]

## 2019-06-17 ENCOUNTER — Other Ambulatory Visit: Payer: Self-pay

## 2019-06-17 DIAGNOSIS — I110 Hypertensive heart disease with heart failure: Secondary | ICD-10-CM | POA: Diagnosis not present

## 2019-06-17 DIAGNOSIS — Z48812 Encounter for surgical aftercare following surgery on the circulatory system: Secondary | ICD-10-CM | POA: Diagnosis not present

## 2019-06-17 DIAGNOSIS — I2510 Atherosclerotic heart disease of native coronary artery without angina pectoris: Secondary | ICD-10-CM | POA: Diagnosis not present

## 2019-06-17 DIAGNOSIS — J439 Emphysema, unspecified: Secondary | ICD-10-CM | POA: Diagnosis not present

## 2019-06-17 DIAGNOSIS — I252 Old myocardial infarction: Secondary | ICD-10-CM | POA: Diagnosis not present

## 2019-06-18 ENCOUNTER — Ambulatory Visit (INDEPENDENT_AMBULATORY_CARE_PROVIDER_SITE_OTHER): Payer: Medicare Other | Admitting: Nurse Practitioner

## 2019-06-18 ENCOUNTER — Encounter: Payer: Self-pay | Admitting: Nurse Practitioner

## 2019-06-18 VITALS — BP 132/69 | HR 83 | Temp 97.8°F | Resp 20 | Ht 59.0 in | Wt 117.0 lb

## 2019-06-18 DIAGNOSIS — M542 Cervicalgia: Secondary | ICD-10-CM | POA: Diagnosis not present

## 2019-06-18 MED ORDER — DICLOFENAC SODIUM 1 % TD GEL: 4.0000 g | Freq: Four times a day (QID) | TRANSDERMAL | 2 refills | Status: AC

## 2019-06-18 NOTE — Progress Notes (Signed)
   Subjective:    Patient ID: Amy Chambers, female    DOB: [birth year], 85 y.o.   MRN: [id number]   Chief Complaint: Neck Pain   HPI Patient brought in by daughter with c/o back pain and neck pain. Sh erecently got out of the hospital with heart attack, which they said did some heart damage. She had a stent put in and develop fluid around her heart. That lead to having open heart surgery. She was discharged from hospital 05/31/19 and went to [facility] for 2 weeks , and just came home on Friday. Sh eis doing well. She is c/o pain on right side of neck that radiates up into her neck. Pain is a stabbing pain and worsens with movement. Rates  Pain 7-8/10. Nothing seems to make it worse and nothing seems to make it better.daughter says her pin toleracne is very low.    Review of Systems  Constitutional: Negative for activity change and appetite change.  HENT: Negative.   Eyes: Negative for pain.  Respiratory: Negative for shortness of breath.   Cardiovascular: Negative for chest pain, palpitations and leg swelling.  Gastrointestinal: Negative for abdominal pain.  Endocrine: Negative for polydipsia.  Genitourinary: Negative.   Skin: Negative for rash.  Neurological: Negative for dizziness, weakness and headaches.  Hematological: Does not bruise/bleed easily.  Psychiatric/Behavioral: Negative.   All other systems reviewed and are negative.      Objective:   Physical Exam Vitals signs and nursing note reviewed.  Constitutional:      Appearance: Normal appearance.  HENT:     Head:     Comments: Pain on light palpation of right jaw Cardiovascular:     Rate and Rhythm: Normal rate and regular rhythm.     Heart sounds: Normal heart sounds.  Pulmonary:     Breath sounds: Normal breath sounds.  Musculoskeletal:     Comments: FROM of neck without pain  Skin:    General: Skin is warm.     Comments: Flakiness of scalp on right side- like craddle cap  Neurological:     General: No focal  deficit present.     Mental Status: She is alert and oriented to person, place, and time.  Psychiatric:        Mood and Affect: Mood normal.        Behavior: Behavior normal.    BP 132/69   Pulse 83   Temp 97.8 F (36.6 C) (Temporal)   Resp 20   Ht 4\' 11"  (1.499 m)   Wt 117 lb (53.1 kg)   SpO2 99%   BMI 23.63 kg/m       Assessment & Plan:  Amy Chambers in today with chief complaint of Neck Pain   1. Neck pain Moist heat to neck RTO prn - diclofenac sodium (VOLTAREN) 1 % GEL; Apply 4 g topically 4 (four) times daily.  Dispense: 350 g; Refill: 2   [name] Hassell Done, [name]

## 2019-06-18 NOTE — Patient Instructions (Signed)
Musculoskeletal Pain Musculoskeletal pain refers to aches and pains in your bones, joints, muscles, and the tissues that surround them. This pain can occur in any part of the body. It can last for a short time (acute) or a long time (chronic). A physical exam, lab tests, and imaging studies may be done to find the cause of your musculoskeletal pain. Follow these instructions at home:  Lifestyle  Try to control or lower your stress levels. Stress increases muscle tension and can worsen musculoskeletal pain. It is important to recognize when you are anxious or stressed and learn ways to manage it. This may include: ? Meditation or yoga. ? Cognitive or behavioral therapy. ? Acupuncture or massage therapy.  You may continue all activities unless the activities cause more pain. When the pain gets better, slowly resume your normal activities. Gradually increase the intensity and duration of your activities or exercise. Managing pain, stiffness, and swelling  Take over-the-counter and prescription medicines only as told by your health care provider.  When your pain is severe, bed rest may be helpful. Lie or sit in any position that is comfortable, but get out of bed and walk around at least every couple of hours.  If directed, apply heat to the affected area as often as told by your health care provider. Use the heat source that your health care provider recommends, such as a moist heat pack or a heating pad. ? Place a towel between your skin and the heat source. ? Leave the heat on for 20-30 minutes. ? Remove the heat if your skin turns bright red. This is especially important if you are unable to feel pain, heat, or cold. You may have a greater risk of getting burned.  If directed, put ice on the painful area. ? Put ice in a plastic bag. ? Place a towel between your skin and the bag. ? Leave the ice on for 20 minutes, 2-3 times a day. General instructions  Your health care provider may  recommend that you see a physical therapist. This person can help you come up with a safe exercise program. Do any exercises as told by your physical therapist.  Keep all follow-up visits, including any physical therapy visits, as told by your health care providers. This is important. Contact a health care provider if:  Your pain gets worse.  Medicines do not help ease your pain.  You cannot use the part of your body that hurts, such as your arm, leg, or neck.  You have trouble sleeping.  You have trouble doing your normal activities. Get help right away if:  You have a new injury and your pain is worse or different.  You feel numb or you have tingling in the painful area. Summary  Musculoskeletal pain refers to aches and pains in your bones, joints, muscles, and the tissues that surround them.  This pain can occur in any part of the body.  Your health care provider may recommend that you see a physical therapist. This person can help you come up with a safe exercise program. Do any exercises as told by your physical therapist.  Lower your stress level. Stress can worsen musculoskeletal pain. Ways to lower stress may include meditation, yoga, cognitive or behavioral therapy, acupuncture, and massage therapy. This information is not intended to replace advice given to you by your health care provider. Make sure you discuss any questions you have with your health care provider. Document Released: 08/01/2005 Document Revised: 07/14/2017 Document Reviewed:   08/31/2016 Elsevier Patient Education  2020 [employer].  

## 2019-06-21 ENCOUNTER — Other Ambulatory Visit: Payer: Self-pay

## 2019-06-21 DIAGNOSIS — Z20822 Contact with and (suspected) exposure to covid-19: Secondary | ICD-10-CM

## 2019-06-23 LAB — NOVEL CORONAVIRUS, NAA: SARS-CoV-2, NAA: NOT DETECTED

## 2019-06-24 ENCOUNTER — Ambulatory Visit (INDEPENDENT_AMBULATORY_CARE_PROVIDER_SITE_OTHER): Payer: Medicare Other

## 2019-06-24 DIAGNOSIS — I495 Sick sinus syndrome: Secondary | ICD-10-CM

## 2019-06-25 ENCOUNTER — Ambulatory Visit: Payer: Medicare Other | Admitting: Nurse Practitioner

## 2019-06-25 DIAGNOSIS — I110 Hypertensive heart disease with heart failure: Secondary | ICD-10-CM | POA: Diagnosis not present

## 2019-06-25 DIAGNOSIS — I2510 Atherosclerotic heart disease of native coronary artery without angina pectoris: Secondary | ICD-10-CM | POA: Diagnosis not present

## 2019-06-25 DIAGNOSIS — J439 Emphysema, unspecified: Secondary | ICD-10-CM | POA: Diagnosis not present

## 2019-06-25 DIAGNOSIS — Z48812 Encounter for surgical aftercare following surgery on the circulatory system: Secondary | ICD-10-CM | POA: Diagnosis not present

## 2019-06-25 DIAGNOSIS — I252 Old myocardial infarction: Secondary | ICD-10-CM | POA: Diagnosis not present

## 2019-06-26 DIAGNOSIS — Z48812 Encounter for surgical aftercare following surgery on the circulatory system: Secondary | ICD-10-CM | POA: Diagnosis not present

## 2019-06-26 DIAGNOSIS — I110 Hypertensive heart disease with heart failure: Secondary | ICD-10-CM | POA: Diagnosis not present

## 2019-06-26 DIAGNOSIS — I252 Old myocardial infarction: Secondary | ICD-10-CM | POA: Diagnosis not present

## 2019-06-26 DIAGNOSIS — I2510 Atherosclerotic heart disease of native coronary artery without angina pectoris: Secondary | ICD-10-CM | POA: Diagnosis not present

## 2019-06-26 DIAGNOSIS — J439 Emphysema, unspecified: Secondary | ICD-10-CM | POA: Diagnosis not present

## 2019-06-28 ENCOUNTER — Telehealth: Payer: Self-pay | Admitting: Nurse Practitioner

## 2019-06-28 DIAGNOSIS — I110 Hypertensive heart disease with heart failure: Secondary | ICD-10-CM | POA: Diagnosis not present

## 2019-06-28 DIAGNOSIS — I2510 Atherosclerotic heart disease of native coronary artery without angina pectoris: Secondary | ICD-10-CM | POA: Diagnosis not present

## 2019-06-28 DIAGNOSIS — I252 Old myocardial infarction: Secondary | ICD-10-CM | POA: Diagnosis not present

## 2019-06-28 DIAGNOSIS — Z48812 Encounter for surgical aftercare following surgery on the circulatory system: Secondary | ICD-10-CM | POA: Diagnosis not present

## 2019-06-28 DIAGNOSIS — J439 Emphysema, unspecified: Secondary | ICD-10-CM | POA: Diagnosis not present

## 2019-06-28 MED ORDER — BACLOFEN 10 MG PO TABS: 10.0000 mg | ORAL_TABLET | Freq: Three times a day (TID) | ORAL | 0 refills | Status: DC

## 2019-06-28 NOTE — Telephone Encounter (Signed)
Will try some low dose baclofen

## 2019-06-28 NOTE — Telephone Encounter (Signed)
Patient aware.

## 2019-07-01 DIAGNOSIS — J439 Emphysema, unspecified: Secondary | ICD-10-CM | POA: Diagnosis not present

## 2019-07-01 DIAGNOSIS — I2510 Atherosclerotic heart disease of native coronary artery without angina pectoris: Secondary | ICD-10-CM | POA: Diagnosis not present

## 2019-07-01 DIAGNOSIS — I110 Hypertensive heart disease with heart failure: Secondary | ICD-10-CM | POA: Diagnosis not present

## 2019-07-01 DIAGNOSIS — Z48812 Encounter for surgical aftercare following surgery on the circulatory system: Secondary | ICD-10-CM | POA: Diagnosis not present

## 2019-07-01 DIAGNOSIS — I252 Old myocardial infarction: Secondary | ICD-10-CM | POA: Diagnosis not present

## 2019-07-02 ENCOUNTER — Other Ambulatory Visit: Payer: Self-pay

## 2019-07-02 ENCOUNTER — Ambulatory Visit (INDEPENDENT_AMBULATORY_CARE_PROVIDER_SITE_OTHER): Payer: Medicare Other

## 2019-07-02 DIAGNOSIS — I313 Pericardial effusion (noninflammatory): Secondary | ICD-10-CM

## 2019-07-02 DIAGNOSIS — D72829 Elevated white blood cell count, unspecified: Secondary | ICD-10-CM

## 2019-07-02 DIAGNOSIS — I5041 Acute combined systolic (congestive) and diastolic (congestive) heart failure: Secondary | ICD-10-CM

## 2019-07-02 DIAGNOSIS — E43 Unspecified severe protein-calorie malnutrition: Secondary | ICD-10-CM

## 2019-07-02 DIAGNOSIS — E782 Mixed hyperlipidemia: Secondary | ICD-10-CM

## 2019-07-02 DIAGNOSIS — Z9841 Cataract extraction status, right eye: Secondary | ICD-10-CM

## 2019-07-02 DIAGNOSIS — Z87891 Personal history of nicotine dependence: Secondary | ICD-10-CM

## 2019-07-02 DIAGNOSIS — K7689 Other specified diseases of liver: Secondary | ICD-10-CM

## 2019-07-02 DIAGNOSIS — Z955 Presence of coronary angioplasty implant and graft: Secondary | ICD-10-CM

## 2019-07-02 DIAGNOSIS — I700 Atherosclerosis of aorta: Secondary | ICD-10-CM

## 2019-07-02 DIAGNOSIS — Z7902 Long term (current) use of antithrombotics/antiplatelets: Secondary | ICD-10-CM

## 2019-07-02 DIAGNOSIS — I110 Hypertensive heart disease with heart failure: Secondary | ICD-10-CM

## 2019-07-02 DIAGNOSIS — I2510 Atherosclerotic heart disease of native coronary artery without angina pectoris: Secondary | ICD-10-CM

## 2019-07-02 DIAGNOSIS — D62 Acute posthemorrhagic anemia: Secondary | ICD-10-CM

## 2019-07-02 DIAGNOSIS — K5909 Other constipation: Secondary | ICD-10-CM

## 2019-07-02 DIAGNOSIS — I252 Old myocardial infarction: Secondary | ICD-10-CM

## 2019-07-02 DIAGNOSIS — J9601 Acute respiratory failure with hypoxia: Secondary | ICD-10-CM

## 2019-07-02 DIAGNOSIS — Z9181 History of falling: Secondary | ICD-10-CM

## 2019-07-02 DIAGNOSIS — I0981 Rheumatic heart failure: Secondary | ICD-10-CM

## 2019-07-02 DIAGNOSIS — Z7901 Long term (current) use of anticoagulants: Secondary | ICD-10-CM

## 2019-07-02 DIAGNOSIS — K219 Gastro-esophageal reflux disease without esophagitis: Secondary | ICD-10-CM

## 2019-07-02 DIAGNOSIS — Z9842 Cataract extraction status, left eye: Secondary | ICD-10-CM

## 2019-07-02 DIAGNOSIS — K5730 Diverticulosis of large intestine without perforation or abscess without bleeding: Secondary | ICD-10-CM

## 2019-07-02 DIAGNOSIS — R1313 Dysphagia, pharyngeal phase: Secondary | ICD-10-CM

## 2019-07-02 DIAGNOSIS — E038 Other specified hypothyroidism: Secondary | ICD-10-CM

## 2019-07-02 DIAGNOSIS — I083 Combined rheumatic disorders of mitral, aortic and tricuspid valves: Secondary | ICD-10-CM

## 2019-07-02 DIAGNOSIS — J439 Emphysema, unspecified: Secondary | ICD-10-CM

## 2019-07-02 DIAGNOSIS — I480 Paroxysmal atrial fibrillation: Secondary | ICD-10-CM

## 2019-07-02 DIAGNOSIS — I6523 Occlusion and stenosis of bilateral carotid arteries: Secondary | ICD-10-CM

## 2019-07-02 DIAGNOSIS — M8580 Other specified disorders of bone density and structure, unspecified site: Secondary | ICD-10-CM

## 2019-07-02 DIAGNOSIS — Z48812 Encounter for surgical aftercare following surgery on the circulatory system: Secondary | ICD-10-CM

## 2019-07-03 ENCOUNTER — Telehealth: Payer: Self-pay | Admitting: Nurse Practitioner

## 2019-07-03 DIAGNOSIS — I252 Old myocardial infarction: Secondary | ICD-10-CM | POA: Diagnosis not present

## 2019-07-03 DIAGNOSIS — I110 Hypertensive heart disease with heart failure: Secondary | ICD-10-CM | POA: Diagnosis not present

## 2019-07-03 DIAGNOSIS — Z48812 Encounter for surgical aftercare following surgery on the circulatory system: Secondary | ICD-10-CM | POA: Diagnosis not present

## 2019-07-03 DIAGNOSIS — I2510 Atherosclerotic heart disease of native coronary artery without angina pectoris: Secondary | ICD-10-CM | POA: Diagnosis not present

## 2019-07-03 DIAGNOSIS — J439 Emphysema, unspecified: Secondary | ICD-10-CM | POA: Diagnosis not present

## 2019-07-03 NOTE — Telephone Encounter (Signed)
Patient daughter states they have tried voltaren gel and it did not help. Patients daughter wants to know if a CT scan of MRI can be done. If nose bleed comes back she will call us and let us know or take her to Urgent Care. Patients daughter aware that MMM out til tomorrow for her to review on medication and scan,

## 2019-07-03 NOTE — Telephone Encounter (Signed)
Need to try OTC Voltaren gel. If nose continues to bleed, she needs to be seen.

## 2019-07-04 DIAGNOSIS — I252 Old myocardial infarction: Secondary | ICD-10-CM | POA: Diagnosis not present

## 2019-07-04 DIAGNOSIS — I110 Hypertensive heart disease with heart failure: Secondary | ICD-10-CM | POA: Diagnosis not present

## 2019-07-04 DIAGNOSIS — I2510 Atherosclerotic heart disease of native coronary artery without angina pectoris: Secondary | ICD-10-CM | POA: Diagnosis not present

## 2019-07-04 DIAGNOSIS — Z48812 Encounter for surgical aftercare following surgery on the circulatory system: Secondary | ICD-10-CM | POA: Diagnosis not present

## 2019-07-04 DIAGNOSIS — J439 Emphysema, unspecified: Secondary | ICD-10-CM | POA: Diagnosis not present

## 2019-07-04 MED ORDER — PREDNISONE 20 MG PO TABS: ORAL_TABLET | ORAL | 0 refills | Status: DC

## 2019-07-04 NOTE — Telephone Encounter (Signed)
Daughter aware and verbalizes understanding. 

## 2019-07-04 NOTE — Telephone Encounter (Signed)
Insurance will not approve CT or MRI without conservative treatment for at least 6 weeks. Will try steroid for a few days to see if helps- prescription sent to pharmacy

## 2019-07-08 ENCOUNTER — Other Ambulatory Visit: Payer: Self-pay | Admitting: Adult Health

## 2019-07-08 ENCOUNTER — Telehealth: Payer: Self-pay | Admitting: Nurse Practitioner

## 2019-07-08 DIAGNOSIS — I110 Hypertensive heart disease with heart failure: Secondary | ICD-10-CM | POA: Diagnosis not present

## 2019-07-08 DIAGNOSIS — J439 Emphysema, unspecified: Secondary | ICD-10-CM | POA: Diagnosis not present

## 2019-07-08 DIAGNOSIS — Z48812 Encounter for surgical aftercare following surgery on the circulatory system: Secondary | ICD-10-CM | POA: Diagnosis not present

## 2019-07-08 DIAGNOSIS — I2510 Atherosclerotic heart disease of native coronary artery without angina pectoris: Secondary | ICD-10-CM | POA: Diagnosis not present

## 2019-07-08 DIAGNOSIS — M542 Cervicalgia: Secondary | ICD-10-CM

## 2019-07-08 DIAGNOSIS — I252 Old myocardial infarction: Secondary | ICD-10-CM | POA: Diagnosis not present

## 2019-07-08 NOTE — Telephone Encounter (Signed)
The only other thing we can do is order and MRI of her neck

## 2019-07-08 NOTE — Telephone Encounter (Signed)
Patient aware and verbalized understanding. Wants to go to [facility]. Contact number [phone]

## 2019-07-08 NOTE — Telephone Encounter (Signed)
Prednisone was sent in 11/19- please advise

## 2019-07-09 ENCOUNTER — Other Ambulatory Visit: Payer: Self-pay

## 2019-07-09 ENCOUNTER — Ambulatory Visit (INDEPENDENT_AMBULATORY_CARE_PROVIDER_SITE_OTHER): Payer: Medicare Other

## 2019-07-09 DIAGNOSIS — I110 Hypertensive heart disease with heart failure: Secondary | ICD-10-CM

## 2019-07-09 DIAGNOSIS — E038 Other specified hypothyroidism: Secondary | ICD-10-CM

## 2019-07-09 DIAGNOSIS — I5041 Acute combined systolic (congestive) and diastolic (congestive) heart failure: Secondary | ICD-10-CM

## 2019-07-09 DIAGNOSIS — J9601 Acute respiratory failure with hypoxia: Secondary | ICD-10-CM

## 2019-07-09 DIAGNOSIS — K5730 Diverticulosis of large intestine without perforation or abscess without bleeding: Secondary | ICD-10-CM

## 2019-07-09 DIAGNOSIS — Z7902 Long term (current) use of antithrombotics/antiplatelets: Secondary | ICD-10-CM

## 2019-07-09 DIAGNOSIS — D62 Acute posthemorrhagic anemia: Secondary | ICD-10-CM

## 2019-07-09 DIAGNOSIS — I313 Pericardial effusion (noninflammatory): Secondary | ICD-10-CM

## 2019-07-09 DIAGNOSIS — J439 Emphysema, unspecified: Secondary | ICD-10-CM

## 2019-07-09 DIAGNOSIS — K5909 Other constipation: Secondary | ICD-10-CM

## 2019-07-09 DIAGNOSIS — M8580 Other specified disorders of bone density and structure, unspecified site: Secondary | ICD-10-CM

## 2019-07-09 DIAGNOSIS — D72829 Elevated white blood cell count, unspecified: Secondary | ICD-10-CM

## 2019-07-09 DIAGNOSIS — Z7901 Long term (current) use of anticoagulants: Secondary | ICD-10-CM

## 2019-07-09 DIAGNOSIS — I252 Old myocardial infarction: Secondary | ICD-10-CM | POA: Diagnosis not present

## 2019-07-09 DIAGNOSIS — Z9842 Cataract extraction status, left eye: Secondary | ICD-10-CM

## 2019-07-09 DIAGNOSIS — I6523 Occlusion and stenosis of bilateral carotid arteries: Secondary | ICD-10-CM

## 2019-07-09 DIAGNOSIS — Z48812 Encounter for surgical aftercare following surgery on the circulatory system: Secondary | ICD-10-CM | POA: Diagnosis not present

## 2019-07-09 DIAGNOSIS — I083 Combined rheumatic disorders of mitral, aortic and tricuspid valves: Secondary | ICD-10-CM

## 2019-07-09 DIAGNOSIS — I2510 Atherosclerotic heart disease of native coronary artery without angina pectoris: Secondary | ICD-10-CM

## 2019-07-09 DIAGNOSIS — I700 Atherosclerosis of aorta: Secondary | ICD-10-CM

## 2019-07-09 DIAGNOSIS — Z9841 Cataract extraction status, right eye: Secondary | ICD-10-CM

## 2019-07-09 DIAGNOSIS — Z955 Presence of coronary angioplasty implant and graft: Secondary | ICD-10-CM

## 2019-07-09 DIAGNOSIS — I0981 Rheumatic heart failure: Secondary | ICD-10-CM

## 2019-07-09 DIAGNOSIS — I480 Paroxysmal atrial fibrillation: Secondary | ICD-10-CM

## 2019-07-09 DIAGNOSIS — K219 Gastro-esophageal reflux disease without esophagitis: Secondary | ICD-10-CM

## 2019-07-09 DIAGNOSIS — E782 Mixed hyperlipidemia: Secondary | ICD-10-CM

## 2019-07-09 DIAGNOSIS — K7689 Other specified diseases of liver: Secondary | ICD-10-CM

## 2019-07-09 DIAGNOSIS — Z9181 History of falling: Secondary | ICD-10-CM

## 2019-07-09 DIAGNOSIS — E43 Unspecified severe protein-calorie malnutrition: Secondary | ICD-10-CM

## 2019-07-09 DIAGNOSIS — R1313 Dysphagia, pharyngeal phase: Secondary | ICD-10-CM

## 2019-07-09 DIAGNOSIS — Z87891 Personal history of nicotine dependence: Secondary | ICD-10-CM

## 2019-07-12 ENCOUNTER — Emergency Department (HOSPITAL_COMMUNITY)
Admission: EM | Admit: 2019-07-12 | Discharge: 2019-07-12 | Disposition: A | Discharge status: Home / Self Care | Payer: Medicare Other | Attending: Emergency Medicine | Admitting: Emergency Medicine

## 2019-07-12 ENCOUNTER — Other Ambulatory Visit: Payer: Self-pay

## 2019-07-12 ENCOUNTER — Telehealth: Payer: Self-pay | Admitting: Family Medicine

## 2019-07-12 ENCOUNTER — Encounter (HOSPITAL_COMMUNITY): Payer: Self-pay | Admitting: *Deleted

## 2019-07-12 DIAGNOSIS — G4489 Other headache syndrome: Secondary | ICD-10-CM | POA: Diagnosis not present

## 2019-07-12 DIAGNOSIS — I10 Essential (primary) hypertension: Secondary | ICD-10-CM | POA: Insufficient documentation

## 2019-07-12 DIAGNOSIS — J449 Chronic obstructive pulmonary disease, unspecified: Secondary | ICD-10-CM | POA: Insufficient documentation

## 2019-07-12 DIAGNOSIS — I252 Old myocardial infarction: Secondary | ICD-10-CM | POA: Diagnosis not present

## 2019-07-12 DIAGNOSIS — Z79899 Other long term (current) drug therapy: Secondary | ICD-10-CM | POA: Diagnosis not present

## 2019-07-12 DIAGNOSIS — Z87891 Personal history of nicotine dependence: Secondary | ICD-10-CM | POA: Diagnosis not present

## 2019-07-12 DIAGNOSIS — M542 Cervicalgia: Secondary | ICD-10-CM | POA: Diagnosis present

## 2019-07-12 DIAGNOSIS — M4722 Other spondylosis with radiculopathy, cervical region: Secondary | ICD-10-CM | POA: Diagnosis not present

## 2019-07-12 DIAGNOSIS — E039 Hypothyroidism, unspecified: Secondary | ICD-10-CM | POA: Insufficient documentation

## 2019-07-12 DIAGNOSIS — I4891 Unspecified atrial fibrillation: Secondary | ICD-10-CM | POA: Diagnosis not present

## 2019-07-12 DIAGNOSIS — R519 Headache, unspecified: Secondary | ICD-10-CM | POA: Diagnosis not present

## 2019-07-12 DIAGNOSIS — Z7901 Long term (current) use of anticoagulants: Secondary | ICD-10-CM | POA: Diagnosis not present

## 2019-07-12 DIAGNOSIS — Z743 Need for continuous supervision: Secondary | ICD-10-CM | POA: Diagnosis not present

## 2019-07-12 DIAGNOSIS — I959 Hypotension, unspecified: Secondary | ICD-10-CM | POA: Diagnosis not present

## 2019-07-12 MED ORDER — ATORVASTATIN CALCIUM 40 MG PO TABS: 40.0000 mg | ORAL_TABLET | Freq: Every day | ORAL | 0 refills | Status: DC

## 2019-07-12 MED ORDER — APIXABAN 2.5 MG PO TABS: 2.5000 mg | ORAL_TABLET | Freq: Two times a day (BID) | ORAL | 0 refills | Status: DC

## 2019-07-12 MED ORDER — TRAMADOL-ACETAMINOPHEN 37.5-325 MG PO TABS: 1.0000 | ORAL_TABLET | Freq: Four times a day (QID) | ORAL | 0 refills | Status: DC | PRN

## 2019-07-12 MED ORDER — KETOROLAC TROMETHAMINE 60 MG/2ML IM SOLN
15.0000 mg | Freq: Once | INTRAMUSCULAR | Status: AC
  Administered 2019-07-12: 15 mg via INTRAMUSCULAR
  Filled 2019-07-12: qty 2

## 2019-07-12 MED ORDER — FUROSEMIDE 20 MG PO TABS: 20.0000 mg | ORAL_TABLET | Freq: Every day | ORAL | 0 refills | Status: DC

## 2019-07-12 MED ORDER — LORAZEPAM 0.5 MG PO TABS
0.5000 mg | ORAL_TABLET | Freq: Once | ORAL | Status: AC
  Administered 2019-07-12: 0.5 mg via ORAL
  Filled 2019-07-12: qty 1

## 2019-07-12 MED ORDER — PANTOPRAZOLE SODIUM 40 MG PO TBEC: 40.0000 mg | DELAYED_RELEASE_TABLET | Freq: Two times a day (BID) | ORAL | 0 refills | Status: DC

## 2019-07-12 MED ORDER — CLOPIDOGREL BISULFATE 75 MG PO TABS: 75.0000 mg | ORAL_TABLET | Freq: Every day | ORAL | 0 refills | Status: DC

## 2019-07-12 MED ORDER — LORAZEPAM 0.5 MG PO TABS: 1.0000 mg | ORAL_TABLET | Freq: Three times a day (TID) | ORAL | 0 refills | Status: AC | PRN

## 2019-07-12 NOTE — Telephone Encounter (Signed)
Patient needed some medications for heart after leaving the hospital, sent 1 month refill and she will touch base with pcp. Caryl Pina, MD [facility] 07/12/2019, 10:14 AM

## 2019-07-12 NOTE — ED Provider Notes (Signed)
[facility] EMERGENCY DEPARTMENT Provider Note   CSN: [id number] Arrival date & time: 07/12/19  [id number]     History   Chief Complaint Chief Complaint  Patient presents with  . Headache    HPI Amy Chambers is a 85 y.o. female.     HPI She presents for ongoing pain and right neck and headache, for several weeks.  She has been seeing her doctor for it and they have been treating her with medications including baclofen and prednisone.  She is participating in a 6-week period of observation with conservative treatment prior to receiving advanced imaging.  An MRI is pending, to be done, on 07/19/2019.  She is unable to give a comprehensive history and defers answers to questions to her daughter is not currently in the room.  Patient examined by me at 7:45 AM.  Level 5 caveat-poor historian    Past Medical History:  Diagnosis Date  . Cataract   . COPD (chronic obstructive pulmonary disease) (HCC)   . Dysphagia 05/31/2019  . GERD (gastroesophageal reflux disease)   . Hypertension   . Osteopenia   . Thyroid disease     Patient Active Problem List   Diagnosis Date Noted  . Acute tension-type headache 06/14/2019  . Chronic constipation 06/09/2019  . Protein-calorie malnutrition, severe (HCC) 06/09/2019  . Acute blood loss anemia, transfusesd 3 units PRBC 05/31/2019  . 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 Anticoagulation adequate on Eliquis for PAF  05/31/2019  . Physical deconditioning due to acute illness 05/31/2019  . Dysphagia 05/31/2019  . DNR (do not resuscitate) 05/31/2019  . Sick sinus syndrome (HCC)   . Acute respiratory failure with hypoxia (HCC)   . COPD with acute exacerbation (HCC)   . Status post coronary artery stent placement   . PAF (paroxysmal atrial fibrillation) (HCC)   . 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 Pericardial tamponade   . CAD S/P percutaneous coronary angioplasty   . Acute  respiratory failure with hypoxia and hypercapnia (HCC) 05/10/2019  . NSTEMI (non-ST elevated myocardial infarction) (HCC) 05/10/2019  . Simple chronic bronchitis (HCC) 05/24/2018  . HTN (hypertension) 12/01/2010  . Hyperlipemia 12/01/2010  . Hypothyroid 12/01/2010  . Osteopenia 12/01/2010  . GERD (gastroesophageal reflux disease) 12/01/2010    Past Surgical History:  Procedure Laterality Date  . CATARACT EXTRACTION, BILATERAL    . CORONARY STENT INTERVENTION N/A 05/14/2019   Procedure: CORONARY STENT INTERVENTION;  Surgeon: Lennette BihariKelly, Thomas A, MD;  [facility];  Service: Cardiovascular;  Laterality: N/A;  . LEFT HEART CATH AND CORONARY ANGIOGRAPHY N/A 05/14/2019   Procedure: LEFT HEART CATH AND CORONARY ANGIOGRAPHY;  Surgeon: Marykay LexHarding, David W, MD;  [facility];  Service: Cardiovascular;  Laterality: N/A;  . LEFT HEART CATH AND CORONARY ANGIOGRAPHY N/A 05/14/2019   Procedure: LEFT HEART CATH AND CORONARY ANGIOGRAPHY;  Surgeon: Lennette BihariKelly, Thomas A, MD;  Location: [facility] INVASIVE CV LAB;  Service: Cardiovascular;  Laterality: N/A;  . MEDIASTINAL EXPLORATION N/A 05/14/2019   Procedure: Mediastinal Exploration with Washout;  Surgeon: Corliss SkainsLightfoot, Harrell O, MD;  Location: [facility] OR;  Service: Open Heart Surgery;  Laterality: N/A;     OB History   No obstetric history on file.      Home Medications    Prior to Admission medications   Medication Sig Start Date End Date Taking? Authorizing Provider  acetaminophen (TYLENOL) 500 MG tablet Take 500 mg by mouth 3 (three)  times daily.    [provider]  amiodarone (PACERONE) 100 MG tablet Take 1 tablet (100 mg total) by mouth every Tuesday, Thursday, Saturday, and Sunday. 06/13/19   Sharee Holster, NP  amiodarone (PACERONE) 200 MG tablet Take 1 tablet (200 mg total) by mouth every Monday, Wednesday, and Friday. 06/12/19   Sharee Holster, NP  apixaban (ELIQUIS) 2.5 MG TABS tablet Take 1 tablet (2.5 mg total) by mouth  2 (two) times daily. 06/12/19   Sharee Holster, NP  atorvastatin (LIPITOR) 40 MG tablet Take 1 tablet (40 mg total) by mouth daily at 6 PM. 06/12/19   Chilton Si, Chong Sicilian, NP  baclofen (LIORESAL) 10 MG tablet Take 1 tablet (10 mg total) by mouth 3 (three) times daily. 06/28/19   [name], [name], [name]  bisacodyl (DULCOLAX) 10 MG suppository Place 1 suppository (10 mg total) rectally daily as needed for moderate constipation. 05/31/19   Leone Brand, NP  calcium-vitamin D 250-100 MG-UNIT per tablet Take 1 tablet by mouth 2 (two) times daily.     [provider]  clopidogrel (PLAVIX) 75 MG tablet Take 1 tablet (75 mg total) by mouth daily. 06/12/19   Sharee Holster, NP  diclofenac sodium (VOLTAREN) 1 % GEL Apply 4 g topically 4 (four) times daily. 06/18/19   [name] [name], [name]  diphenhydrAMINE-zinc acetate (BENADRYL) cream Apply topically 2 (two) times daily as needed for itching. 05/31/19   Leone Brand, NP  furosemide (LASIX) 20 MG tablet Take 1 tablet (20 mg total) by mouth daily. 06/12/19   Sharee Holster, NP  guaiFENesin (MUCINEX) 600 MG 12 hr tablet Take 1 tablet (600 mg total) by mouth 2 (two) times daily. 05/31/19   Leone Brand, NP  levothyroxine (SYNTHROID) 75 MCG tablet Take 1 tablet (75 mcg total) by mouth daily. 06/12/19   Sharee Holster, NP  loratadine (CLARITIN) 10 MG tablet Take 1 tablet (10 mg total) by mouth daily. 06/01/19   Leone Brand, NP  LORazepam (ATIVAN) 0.5 MG tablet Take 2 tablets (1 mg total) by mouth every 8 (eight) hours as needed (muscle spasm). 07/12/19   Mancel Bale, MD  mometasone-formoterol [facility]) 200-5 MCG/ACT AERO Inhale 2 puffs into the lungs 2 (two) times daily. 06/12/19   Sharee Holster, NP  Multiple Vitamin (MULTIVITAMIN WITH MINERALS) TABS tablet Take 1 tablet by mouth daily. 06/01/19   Leone Brand, NP  pantoprazole (PROTONIX) 40 MG tablet Take 1 tablet (40 mg total) by mouth 2 (two) times daily. 06/12/19   Sharee Holster, NP  polyethylene glycol (MIRALAX / GLYCOLAX) packet Take 17 g by mouth daily.    [provider]  predniSONE (DELTASONE) 20 MG tablet 2 po at sametime daily for 5 days 07/04/19   Bennie Pierini, [name]  traMADol-acetaminophen (ULTRACET) 37.5-325 MG tablet Take 1 tablet by mouth every 6 (six) hours as needed for moderate pain. 07/12/19   Mancel Bale, MD  Vitamin D, Cholecalciferol, 25 MCG (1000 UT) TABS Take 1 tablet by mouth daily.    [provider]    Family History Family History  Problem Relation Age of Onset  . Diabetes Mother   . Cancer Brother   . Tuberculosis Father   . Early death Sister   . Early death Brother   . COPD Brother   . Emphysema Brother   . Early death Sister   . COPD Sister   . Emphysema Sister   . Aneurysm Sister   .  Stroke Sister   . Cancer Sister   . Hypertension Son   . Hypertension Daughter   . Fibromyalgia Daughter   . Hypertension Daughter     Social History Social History   Tobacco Use  . Smoking status: Former Smoker    Quit date: 08/15/1994    Years since quitting: 24.9  . Smokeless tobacco: Former Neurosurgeon    Types: Chew    Quit date: 08/15/1998  Substance Use Topics  . Alcohol use: No  . Drug use: No     Allergies   Crestor [rosuvastatin calcium] and Niaspan [niacin er]   Review of Systems Review of Systems  Unable to perform ROS: Other     Physical Exam Updated Vital Signs BP 136/63 (BP Location: Right Arm)   Pulse 75   Temp 97.9 F (36.6 C) (Oral)   Resp 18   Ht 4\' 11"  (1.499 m)   Wt 53.1 kg   SpO2 93%   BMI 23.63 kg/m   Physical Exam Vitals signs and nursing note reviewed.  Constitutional:      General: She is not in acute distress.    Appearance: She is not ill-appearing, toxic-appearing or diaphoretic.     Comments: Elderly, frail  HENT:     Head: Normocephalic and atraumatic.     Right Ear: External ear normal.     Left Ear: External ear normal.     Mouth/Throat:      Mouth: Mucous membranes are moist.  Eyes:     Extraocular Movements: Extraocular movements intact.     Conjunctiva/sclera: Conjunctivae normal.     Pupils: Pupils are equal, round, and reactive to light.  Neck:     Musculoskeletal: Normal range of motion and neck supple.     Trachea: Phonation normal.  Cardiovascular:     Rate and Rhythm: Normal rate and regular rhythm.     Heart sounds: Normal heart sounds.  Pulmonary:     Effort: Pulmonary effort is normal.     Breath sounds: Normal breath sounds.  Abdominal:     General: There is no distension.     Palpations: Abdomen is soft.     Tenderness: There is no abdominal tenderness.  Musculoskeletal:     Comments: Mild tenderness right lateral neck.  No posterior step-off or focal tenderness over the cervical spine.  Skin:    General: Skin is warm and dry.  Neurological:     Mental Status: She is alert.     Cranial Nerves: No cranial nerve deficit.     Motor: No abnormal muscle tone.     Coordination: Coordination normal.  Psychiatric:        Mood and Affect: Mood normal.        Behavior: Behavior normal.      ED Treatments / Results  Labs (all labs ordered are listed, but only abnormal results are displayed) Labs Reviewed - No data to display  EKG EKG Interpretation  Date/Time:  Friday July 12 2019 07:08:04 EST Ventricular Rate:  74 PR Interval:    QRS Duration: 107 QT Interval:  539 QTC Calculation: 599 R Axis:   29 Text Interpretation: Sinus rhythm Nonspecific T abnormalities, lateral leads Prolonged QT interval Since last tracing now in SR and QT is longer Confirmed by 08[birth year]1983 [phone]) on 07/12/2019 8:07:49 AM   Radiology No results found.  Procedures Procedures (including critical care time)  Medications Ordered in ED Medications  ketorolac (TORADOL) injection 15 mg (15 mg Intramuscular Given 07/12/19  6599)  LORazepam (ATIVAN) tablet 0.5 mg (0.5 mg Oral Given 07/12/19 0807)     Initial  Impression / Assessment and Plan / ED Course  I have reviewed the triage vital signs and the nursing notes.  Pertinent labs & imaging results that were available during my care of the patient were reviewed by me and considered in my medical decision making (see chart for details).  Clinical Course as of Jul 11 853  Ludwig Clarks Jul 12, 2019  0853 Patient's daughter [name] is here and states that patient has not been eating since yesterday because of pain in her neck.  She is not currently taking anything for the pain and has completed a course of prednisone recently.  She previously failed using baclofen as a muscle relaxer because of intolerance.  It apparently caused vomiting and difficulty urinating.  Findings discussed with [name].  She agrees with the plan.   [EW]    Clinical Course User Index [EW] Daleen Bo, MD        Patient Vitals for the past 24 hrs:  BP Temp Temp src Pulse Resp SpO2 Height Weight  07/12/19 0702 - - - - - - 4\' 11"  (1.499 m) 53.1 kg  07/12/19 0700 136/63 97.9 F (36.6 C) Oral 75 18 93 % - -    8:54 AM Reevaluation with update and discussion. After initial assessment and treatment, an updated evaluation reveals she appears more comfortable now.  Findings discussed with caregiver, daughter Marcie Bal.  All questions answered. Daleen Bo   Medical Decision Making: Headache and neck pain secondary to cervical spine disease.  Visualization of imaging from May 10, 2019, CT neck done for evaluation of vascular structures.  Able to see cervical spine degenerative changes, which are likely cause of her pain.  Suspect patient has radicular pain causing headache.  Doubt cervical myelopathy.  Doubt fracture.  Doubt acute spine infection.  CRITICAL CARE-no Performed by: Daleen Bo  Nursing Notes Reviewed/ Care Coordinated Applicable Imaging Reviewed Interpretation of Laboratory Data incorporated into ED treatment  The patient appears reasonably screened and/or  stabilized for discharge and I doubt any other medical condition or other [facility] requiring further screening, evaluation, or treatment in the ED at this time prior to discharge.  Plan: Home Medications-continue usual medications; Home Treatments-heat to affected area; return here if the recommended treatment, does not improve the symptoms; Recommended follow up-PCP, as needed.  MRI imaging on 07/19/2019 is scheduled.   Final Clinical Impressions(s) / ED Diagnoses   Final diagnoses:  Osteoarthritis of spine with radiculopathy, cervical region    ED Discharge Orders         Ordered    traMADol-acetaminophen (ULTRACET) 37.5-325 MG tablet  Every 6 hours PRN     07/12/19 0844    LORazepam (ATIVAN) 0.5 MG tablet  Every 8 hours PRN     07/12/19 0844           Daleen Bo, MD 07/12/19 [phone]

## 2019-07-12 NOTE — ED Triage Notes (Signed)
Pt had stent placed and the headaches started after that.

## 2019-07-12 NOTE — ED Triage Notes (Signed)
Pt c/o chronic headache for last three month, medications have been ineffective, MRI has been scheduled, however over the last three days patient has not been able to eat or move around very well.          

## 2019-07-12 NOTE — Discharge Instructions (Signed)
We are prescribing a mild muscle relaxer and pain reliever to help her discomfort, until she can get the MRI done on 07/19/2019.  Try using heat on the sore area 3-4 times a day.  Follow-up with your primary care doctor as needed for problems.

## 2019-07-12 NOTE — ED Triage Notes (Signed)
Pt c/o chronic headache for last three month, medications have been ineffective, MRI has been scheduled, however over the last three days patient has not been able to eat or move around very well.

## 2019-07-15 ENCOUNTER — Telehealth (INDEPENDENT_AMBULATORY_CARE_PROVIDER_SITE_OTHER): Payer: Medicare Other | Admitting: Nurse Practitioner

## 2019-07-15 ENCOUNTER — Other Ambulatory Visit: Payer: Self-pay | Admitting: Nurse Practitioner

## 2019-07-15 ENCOUNTER — Telehealth: Payer: Self-pay

## 2019-07-15 DIAGNOSIS — R609 Edema, unspecified: Secondary | ICD-10-CM | POA: Diagnosis not present

## 2019-07-15 DIAGNOSIS — E039 Hypothyroidism, unspecified: Secondary | ICD-10-CM

## 2019-07-15 DIAGNOSIS — R531 Weakness: Secondary | ICD-10-CM

## 2019-07-15 MED ORDER — LEVOTHYROXINE SODIUM 100 MCG PO TABS: 100.0000 ug | ORAL_TABLET | Freq: Every day | ORAL | 11 refills | Status: AC

## 2019-07-15 NOTE — Patient Instructions (Addendum)
Virtual Visit via video Note  I connected with patients daughter Amy Chambers on 07/15/19 at 11:00 by video and verified that I am speaking with the correct person using two identifiers. [facility] and her daughter Amy Chambers  is currently with her during visit. The provider, Amy Hassell Done, [name] is located at home at time of visit.  I discussed the limitations, risks, security and privacy concerns of performing an evaluation and management service by telephone and the availability of in person appointments. I also discussed with the patient that there may be a patient responsible charge related to this service. The patient expressed understanding and agreed to proceed.   History and Present Illness:  Patient has not been doing well. She went to the ER on Friday with headache and neck pain, no labs or tests were ran and she was given ultram and ativan. Her daughter also says she was very weak when they took her to the hospital. Since hospital visit she  has been weak  and lethagric. The ativan has caused her to sleep a lot.  She has a very poor appetite and has not been drinking well. She has edema in right lower ext. She gets extremely short of breath on exertion.    Review of Systems - History obtained from daughter Amy Chambers General ROS: positive for  - fatigue and weight loss Psychological ROS: positive for - anxiety and memory difficulties Respiratory ROS: positive for - shortness of breath Cardiovascular ROS: positive for - dyspnea on exertion, shortness of breath and lower ext edema Gastrointestinal ROS: positive for - appetite loss and change in stools       Observations/Objective: Patient very hard to arouse , but when aroused is alert and oriented Slow to answer questions 1+ edema right ankle and foot Sleeping through most of exam  Assessment and Plan: Amy Chambers in today with chief complaint of weakness  1. Peripheral edema 2. Sick sinus syndrome  ([place]) 3. Acute combined systolic and diastolic HF (heart failure) (HCC) EF 45%  4. Hypothyroidism, unspecified type 5. Essential hypertension 6. Mixed hyperlipidemia 7. Simple chronic bronchitis ([place]) 8. PAF (paroxysmal atrial fibrillation) (HCC)  Increase levothyroxin to 143mg - due to last labs Needs labs  Orders Placed This Encounter  Procedures  . CBC with Differential/Platelet    Standing Status:   Future    Standing Expiration Date:   07/14/2020  . CMP14+EGFR    Standing Status:   Future    Standing Expiration Date:   07/14/2020  . Thyroid Panel With TSH    Standing Status:   Future    Standing Expiration Date:   07/14/2020  . Ambulatory referral to Home Health    Referral Priority:   Routine    Referral Type:   Home Health Care    Referral Reason:   Specialty Services Required    [place]ed [place]:   [place]   Number of Visits Requested:   1       Follow Up Instructions: prn    I discussed the assessment and treatment plan with the patient. The patient was provided an opportunity to ask questions and all were answered. The patient agreed with the plan and demonstrated an understanding of the instructions.   The patient was advised to call back or seek an in-person evaluation if the symptoms worsen or if the condition fails to improve as anticipated.  The above assessment and management plan was discussed with  the patient. The patient verbalized understanding of and has agreed to the management plan. Patient is aware to call the clinic if symptoms persist or worsen. Patient is aware when to return to the clinic for a follow-up visit. Patient educated on when it is appropriate to go to the emergency department.   Time call ended: 11:30  I provided 30 minutes of face-to-face time during this encounter.    Amy Hassell Done, [name]

## 2019-07-15 NOTE — Telephone Encounter (Signed)
Patients daughter called wanting advice. States that mom is so weak she can't walk. She is in her right mind but too weak to get out of bed. They took her to the hospital over the weekend and they her pain medication. Patients daughter wants to know what you think they should do next. Can they get a nurse to come in and help or should they place her in a facility? Family really doesn't want to place her because they won't be able to see her due to Covid but they need help. What do you suggest? Call daughter at [phone]

## 2019-07-15 NOTE — Progress Notes (Signed)
Virtual Visit via video Note  I connected with patients daughter Amy Chambers on 07/15/19 at 11:00 by video and verified that I am speaking with the correct person using two identifiers. [facility] is currently [facility] and her daughter Amy Chambers  is currently with her during visit. The provider, [name] Hassell Done, [name] is located at home at time of visit.  I discussed the limitations, risks, security and privacy concerns of performing an evaluation and management service by telephone and the availability of in person appointments. I also discussed with the patient that there may be a patient responsible charge related to this service. The patient expressed understanding and agreed to proceed.   History and Present Illness:  Patient has not been doing well. She went to the ER on Friday with headache and neck pain, no labs or tests were ran and she was given ultram and ativan. Her daughter also says she was very weak when they took her to the hospital. Since hospital visit she  has been weak  and lethagric. The ativan has caused her to sleep a lot.  She has a very poor appetite and has not been drinking well. She has edema in right lower ext. She gets extremely short of breath on exertion.    Review of Systems - History obtained from daughter Amy Chambers General ROS: positive for  - fatigue and weight loss Psychological ROS: positive for - anxiety and memory difficulties Respiratory ROS: positive for - shortness of breath Cardiovascular ROS: positive for - dyspnea on exertion, shortness of breath and lower ext edema Gastrointestinal ROS: positive for - appetite loss and change in stools       Observations/Objective: Patient very hard to arouse , but when aroused is alert and oriented Slow to answer questions 1+ edema right ankle and foot Sleeping through most of exam  Assessment and Plan: Amy Chambers in today with chief complaint of weakness  1. Peripheral edema 2. Sick sinus syndrome  ([place]) 3. Acute combined systolic and diastolic HF (heart failure) (HCC) EF 45%  4. Hypothyroidism, unspecified type 5. Essential hypertension 6. Mixed hyperlipidemia 7. Simple chronic bronchitis ([place]) 8. PAF (paroxysmal atrial fibrillation) (HCC)  Increase levothyroxin to 160mg - due to last labs Needs labs  Orders Placed This Encounter  Procedures  . CBC with Differential/Platelet    Standing Status:   Future    Standing Expiration Date:   07/14/2020  . CMP14+EGFR    Standing Status:   Future    Standing Expiration Date:   07/14/2020  . Thyroid Panel With TSH    Standing Status:   Future    Standing Expiration Date:   07/14/2020  . Ambulatory referral to Home Health    Referral Priority:   Routine    Referral Type:   Home Health Care    Referral Reason:   Specialty Services Required    [place]ed [place]ty:   [place]   Number of Visits Requested:   1       Follow Up Instructions: prn    I discussed the assessment and treatment plan with the patient. The patient was provided an opportunity to ask questions and all were answered. The patient agreed with the plan and demonstrated an understanding of the instructions.   The patient was advised to call back or seek an in-person evaluation if the symptoms worsen or if the condition fails to improve as anticipated.  The above assessment and management plan was discussed with the  patient. The patient verbalized understanding of and has agreed to the management plan. Patient is aware to call the clinic if symptoms persist or worsen. Patient is aware when to return to the clinic for a follow-up visit. Patient educated on when it is appropriate to go to the emergency department.   Time call ended: 11:30  I provided 30 minutes of face-to-face time during this encounter.    [name] Hassell Done, [name]

## 2019-07-16 ENCOUNTER — Other Ambulatory Visit: Payer: Medicare Other

## 2019-07-16 ENCOUNTER — Other Ambulatory Visit: Payer: Self-pay

## 2019-07-16 ENCOUNTER — Telehealth: Payer: Self-pay | Admitting: Nurse Practitioner

## 2019-07-16 DIAGNOSIS — R531 Weakness: Secondary | ICD-10-CM

## 2019-07-16 DIAGNOSIS — Z7952 Long term (current) use of systemic steroids: Secondary | ICD-10-CM | POA: Diagnosis not present

## 2019-07-16 DIAGNOSIS — S9031XD Contusion of right foot, subsequent encounter: Secondary | ICD-10-CM | POA: Diagnosis not present

## 2019-07-16 DIAGNOSIS — E039 Hypothyroidism, unspecified: Secondary | ICD-10-CM | POA: Diagnosis not present

## 2019-07-16 DIAGNOSIS — Z7901 Long term (current) use of anticoagulants: Secondary | ICD-10-CM | POA: Diagnosis not present

## 2019-07-16 DIAGNOSIS — I495 Sick sinus syndrome: Secondary | ICD-10-CM | POA: Diagnosis not present

## 2019-07-16 DIAGNOSIS — J410 Simple chronic bronchitis: Secondary | ICD-10-CM | POA: Diagnosis not present

## 2019-07-16 DIAGNOSIS — R609 Edema, unspecified: Secondary | ICD-10-CM

## 2019-07-16 DIAGNOSIS — I480 Paroxysmal atrial fibrillation: Secondary | ICD-10-CM | POA: Diagnosis not present

## 2019-07-16 DIAGNOSIS — G8929 Other chronic pain: Secondary | ICD-10-CM | POA: Diagnosis not present

## 2019-07-16 DIAGNOSIS — R519 Headache, unspecified: Secondary | ICD-10-CM | POA: Diagnosis not present

## 2019-07-16 DIAGNOSIS — E860 Dehydration: Secondary | ICD-10-CM

## 2019-07-16 DIAGNOSIS — M542 Cervicalgia: Secondary | ICD-10-CM | POA: Diagnosis not present

## 2019-07-16 DIAGNOSIS — I110 Hypertensive heart disease with heart failure: Secondary | ICD-10-CM | POA: Diagnosis not present

## 2019-07-16 DIAGNOSIS — E782 Mixed hyperlipidemia: Secondary | ICD-10-CM | POA: Diagnosis not present

## 2019-07-16 DIAGNOSIS — Z7689 Persons encountering health services in other specified circumstances: Secondary | ICD-10-CM | POA: Diagnosis not present

## 2019-07-16 DIAGNOSIS — R600 Localized edema: Secondary | ICD-10-CM

## 2019-07-16 DIAGNOSIS — I5041 Acute combined systolic (congestive) and diastolic (congestive) heart failure: Secondary | ICD-10-CM | POA: Diagnosis not present

## 2019-07-16 MED ORDER — LIDOCAINE 5 % EX PTCH: 1.0000 | MEDICATED_PATCH | CUTANEOUS | 0 refills | Status: AC

## 2019-07-16 MED ORDER — PREDNISONE 10 MG (21) PO TBPK: ORAL_TABLET | ORAL | 0 refills | Status: DC

## 2019-07-16 NOTE — Telephone Encounter (Signed)
Daughter informed that prescriptions were sent in and that scripts for the walker and shower chair are at the front for pick up.

## 2019-07-16 NOTE — Telephone Encounter (Signed)
Shower chair and walker rx will have to be picked up

## 2019-07-16 NOTE — Addendum Note (Signed)
Addended by: Michaela Corner on: 07/16/2019 04:24 PM   Modules accepted: Orders

## 2019-07-17 ENCOUNTER — Telehealth: Payer: Self-pay | Admitting: *Deleted

## 2019-07-17 ENCOUNTER — Other Ambulatory Visit: Payer: Self-pay | Admitting: Family Medicine

## 2019-07-17 DIAGNOSIS — E039 Hypothyroidism, unspecified: Secondary | ICD-10-CM | POA: Diagnosis not present

## 2019-07-17 DIAGNOSIS — S9031XD Contusion of right foot, subsequent encounter: Secondary | ICD-10-CM | POA: Diagnosis not present

## 2019-07-17 DIAGNOSIS — I110 Hypertensive heart disease with heart failure: Secondary | ICD-10-CM | POA: Diagnosis not present

## 2019-07-17 DIAGNOSIS — I495 Sick sinus syndrome: Secondary | ICD-10-CM | POA: Diagnosis not present

## 2019-07-17 DIAGNOSIS — J410 Simple chronic bronchitis: Secondary | ICD-10-CM | POA: Diagnosis not present

## 2019-07-17 DIAGNOSIS — I5041 Acute combined systolic (congestive) and diastolic (congestive) heart failure: Secondary | ICD-10-CM | POA: Diagnosis not present

## 2019-07-17 DIAGNOSIS — G8929 Other chronic pain: Secondary | ICD-10-CM | POA: Diagnosis not present

## 2019-07-17 DIAGNOSIS — R519 Headache, unspecified: Secondary | ICD-10-CM | POA: Diagnosis not present

## 2019-07-17 DIAGNOSIS — Z7901 Long term (current) use of anticoagulants: Secondary | ICD-10-CM | POA: Diagnosis not present

## 2019-07-17 DIAGNOSIS — Z7952 Long term (current) use of systemic steroids: Secondary | ICD-10-CM | POA: Diagnosis not present

## 2019-07-17 DIAGNOSIS — I480 Paroxysmal atrial fibrillation: Secondary | ICD-10-CM | POA: Diagnosis not present

## 2019-07-17 DIAGNOSIS — M542 Cervicalgia: Secondary | ICD-10-CM | POA: Diagnosis not present

## 2019-07-17 DIAGNOSIS — E782 Mixed hyperlipidemia: Secondary | ICD-10-CM | POA: Diagnosis not present

## 2019-07-17 LAB — CBC WITH DIFFERENTIAL/PLATELET
Basophils Absolute: 0 10*3/uL (ref 0.0–0.2)
Basos: 0 %
EOS (ABSOLUTE): 0.1 10*3/uL (ref 0.0–0.4)
Eos: 1 %
Hematocrit: 26.1 % — ABNORMAL LOW (ref 34.0–46.6)
Hemoglobin: 8.3 g/dL — CL (ref 11.1–15.9)
Immature Grans (Abs): 0.2 10*3/uL — ABNORMAL HIGH (ref 0.0–0.1)
Immature Granulocytes: 1 %
Lymphocytes Absolute: 1.4 10*3/uL (ref 0.7–3.1)
Lymphs: 11 %
MCH: 31.2 pg (ref 26.6–33.0)
MCHC: 31.8 g/dL (ref 31.5–35.7)
MCV: 98 fL — ABNORMAL HIGH (ref 79–97)
Monocytes Absolute: 0.8 10*3/uL (ref 0.1–0.9)
Monocytes: 7 %
Neutrophils Absolute: 9.6 10*3/uL — ABNORMAL HIGH (ref 1.4–7.0)
Neutrophils: 80 %
Platelets: 326 10*3/uL (ref 150–450)
RBC: 2.66 x10E6/uL — CL (ref 3.77–5.28)
RDW: 14.6 % (ref 11.7–15.4)
WBC: 12.1 10*3/uL — ABNORMAL HIGH (ref 3.4–10.8)

## 2019-07-17 LAB — CMP14+EGFR
ALT: 16 IU/L (ref 0–32)
AST: 22 IU/L (ref 0–40)
Albumin/Globulin Ratio: 1.2 (ref 1.2–2.2)
Albumin: 2.6 g/dL — ABNORMAL LOW (ref 3.6–4.6)
Alkaline Phosphatase: 72 IU/L (ref 39–117)
BUN/Creatinine Ratio: 37 — ABNORMAL HIGH (ref 12–28)
BUN: 33 mg/dL — ABNORMAL HIGH (ref 8–27)
Bilirubin Total: 0.3 mg/dL (ref 0.0–1.2)
CO2: 28 mmol/L (ref 20–29)
Calcium: 8.8 mg/dL (ref 8.7–10.3)
Chloride: 104 mmol/L (ref 96–106)
Creatinine, Ser: 0.9 mg/dL (ref 0.57–1.00)
GFR calc Af Amer: 66 mL/min/{1.73_m2} (ref >59)
GFR calc non Af Amer: 57 mL/min/{1.73_m2} — ABNORMAL LOW (ref >59)
Globulin, Total: 2.2 g/dL (ref 1.5–4.5)
Glucose: 91 mg/dL (ref 65–99)
Potassium: 4.1 mmol/L (ref 3.5–5.2)
Sodium: 144 mmol/L (ref 134–144)
Total Protein: 4.8 g/dL — ABNORMAL LOW (ref 6.0–8.5)

## 2019-07-17 LAB — THYROID PANEL WITH TSH
Free Thyroxine Index: 3.6 (ref 1.2–4.9)
T3 Uptake Ratio: 40 % — ABNORMAL HIGH (ref 24–39)
T4, Total: 9 ug/dL (ref 4.5–12.0)
TSH: 3.89 u[IU]/mL (ref 0.450–4.500)

## 2019-07-17 MED ORDER — HEMOCYTE PLUS 106-1 MG PO CAPS: 1.0000 | ORAL_CAPSULE | Freq: Every day | ORAL | 3 refills | Status: DC

## 2019-07-17 NOTE — Addendum Note (Signed)
Addended by: Rolena Infante on: 07/17/2019 02:57 PM   Modules accepted: Orders

## 2019-07-17 NOTE — Telephone Encounter (Addendum)
Prior Auth for Lidocaine 5% Patches-APPROVED till 08/14/20  Key: [id number] -   [id number]  Pharmacy aware

## 2019-07-18 ENCOUNTER — Other Ambulatory Visit: Payer: Self-pay | Admitting: Nurse Practitioner

## 2019-07-18 ENCOUNTER — Ambulatory Visit (INDEPENDENT_AMBULATORY_CARE_PROVIDER_SITE_OTHER): Payer: Medicare Other

## 2019-07-18 ENCOUNTER — Other Ambulatory Visit: Payer: Self-pay

## 2019-07-18 ENCOUNTER — Other Ambulatory Visit: Payer: Medicare Other

## 2019-07-18 ENCOUNTER — Telehealth: Payer: Self-pay | Admitting: Cardiology

## 2019-07-18 DIAGNOSIS — I6523 Occlusion and stenosis of bilateral carotid arteries: Secondary | ICD-10-CM

## 2019-07-18 DIAGNOSIS — Z9841 Cataract extraction status, right eye: Secondary | ICD-10-CM

## 2019-07-18 DIAGNOSIS — J439 Emphysema, unspecified: Secondary | ICD-10-CM

## 2019-07-18 DIAGNOSIS — Z7952 Long term (current) use of systemic steroids: Secondary | ICD-10-CM | POA: Diagnosis not present

## 2019-07-18 DIAGNOSIS — I5041 Acute combined systolic (congestive) and diastolic (congestive) heart failure: Secondary | ICD-10-CM

## 2019-07-18 DIAGNOSIS — Z87891 Personal history of nicotine dependence: Secondary | ICD-10-CM

## 2019-07-18 DIAGNOSIS — I480 Paroxysmal atrial fibrillation: Secondary | ICD-10-CM | POA: Diagnosis not present

## 2019-07-18 DIAGNOSIS — I083 Combined rheumatic disorders of mitral, aortic and tricuspid valves: Secondary | ICD-10-CM

## 2019-07-18 DIAGNOSIS — G8929 Other chronic pain: Secondary | ICD-10-CM | POA: Diagnosis not present

## 2019-07-18 DIAGNOSIS — J9601 Acute respiratory failure with hypoxia: Secondary | ICD-10-CM

## 2019-07-18 DIAGNOSIS — I110 Hypertensive heart disease with heart failure: Secondary | ICD-10-CM | POA: Diagnosis not present

## 2019-07-18 DIAGNOSIS — K219 Gastro-esophageal reflux disease without esophagitis: Secondary | ICD-10-CM

## 2019-07-18 DIAGNOSIS — Z955 Presence of coronary angioplasty implant and graft: Secondary | ICD-10-CM

## 2019-07-18 DIAGNOSIS — I700 Atherosclerosis of aorta: Secondary | ICD-10-CM

## 2019-07-18 DIAGNOSIS — K5909 Other constipation: Secondary | ICD-10-CM

## 2019-07-18 DIAGNOSIS — I2510 Atherosclerotic heart disease of native coronary artery without angina pectoris: Secondary | ICD-10-CM

## 2019-07-18 DIAGNOSIS — D62 Acute posthemorrhagic anemia: Secondary | ICD-10-CM

## 2019-07-18 DIAGNOSIS — R1313 Dysphagia, pharyngeal phase: Secondary | ICD-10-CM

## 2019-07-18 DIAGNOSIS — E782 Mixed hyperlipidemia: Secondary | ICD-10-CM

## 2019-07-18 DIAGNOSIS — R519 Headache, unspecified: Secondary | ICD-10-CM | POA: Diagnosis not present

## 2019-07-18 DIAGNOSIS — K7689 Other specified diseases of liver: Secondary | ICD-10-CM

## 2019-07-18 DIAGNOSIS — D649 Anemia, unspecified: Secondary | ICD-10-CM

## 2019-07-18 DIAGNOSIS — Z48812 Encounter for surgical aftercare following surgery on the circulatory system: Secondary | ICD-10-CM

## 2019-07-18 DIAGNOSIS — E43 Unspecified severe protein-calorie malnutrition: Secondary | ICD-10-CM

## 2019-07-18 DIAGNOSIS — M8580 Other specified disorders of bone density and structure, unspecified site: Secondary | ICD-10-CM

## 2019-07-18 DIAGNOSIS — E038 Other specified hypothyroidism: Secondary | ICD-10-CM

## 2019-07-18 DIAGNOSIS — K5730 Diverticulosis of large intestine without perforation or abscess without bleeding: Secondary | ICD-10-CM

## 2019-07-18 DIAGNOSIS — M542 Cervicalgia: Secondary | ICD-10-CM | POA: Diagnosis not present

## 2019-07-18 DIAGNOSIS — Z7902 Long term (current) use of antithrombotics/antiplatelets: Secondary | ICD-10-CM

## 2019-07-18 DIAGNOSIS — I252 Old myocardial infarction: Secondary | ICD-10-CM | POA: Diagnosis not present

## 2019-07-18 DIAGNOSIS — I313 Pericardial effusion (noninflammatory): Secondary | ICD-10-CM

## 2019-07-18 DIAGNOSIS — Z7901 Long term (current) use of anticoagulants: Secondary | ICD-10-CM | POA: Diagnosis not present

## 2019-07-18 DIAGNOSIS — J410 Simple chronic bronchitis: Secondary | ICD-10-CM | POA: Diagnosis not present

## 2019-07-18 DIAGNOSIS — S9031XD Contusion of right foot, subsequent encounter: Secondary | ICD-10-CM | POA: Diagnosis not present

## 2019-07-18 DIAGNOSIS — I0981 Rheumatic heart failure: Secondary | ICD-10-CM

## 2019-07-18 DIAGNOSIS — Z9181 History of falling: Secondary | ICD-10-CM

## 2019-07-18 DIAGNOSIS — D72829 Elevated white blood cell count, unspecified: Secondary | ICD-10-CM

## 2019-07-18 DIAGNOSIS — Z9842 Cataract extraction status, left eye: Secondary | ICD-10-CM

## 2019-07-18 DIAGNOSIS — I495 Sick sinus syndrome: Secondary | ICD-10-CM | POA: Diagnosis not present

## 2019-07-18 DIAGNOSIS — E039 Hypothyroidism, unspecified: Secondary | ICD-10-CM | POA: Diagnosis not present

## 2019-07-18 LAB — HEMOGLOBIN, FINGERSTICK: Hemoglobin: 7.7 g/dL — ABNORMAL LOW (ref 11.1–15.9)

## 2019-07-18 NOTE — Telephone Encounter (Signed)
Daughter notified of Dr Percell Locus recommendations and verbalized understanding

## 2019-07-18 NOTE — Telephone Encounter (Signed)
Phone number to office if [phone]

## 2019-07-18 NOTE — Telephone Encounter (Signed)
Will forward to Dr. Branch to advise.  

## 2019-07-18 NOTE — Telephone Encounter (Signed)
Can hold eliquis but NOT plavix. She had a stent just 2 months ago and too early to discontinue plavix, risk of stent thrombosis is too high at this time to come off plavix.    Zandra Abts MD

## 2019-07-18 NOTE — Telephone Encounter (Signed)
Mandy at [facility] called stating the pt's hemoglobin has dropped to 8.3.   They're wanting to hold her Eliquis and Plavix till they find out where the bleeding is coming from and wanted to check with Dr. Harl Bowie first before doing so.   Please call Leafy Ro or Shelah Lewandowsky @ [phone]

## 2019-07-19 ENCOUNTER — Other Ambulatory Visit: Payer: Self-pay

## 2019-07-19 ENCOUNTER — Ambulatory Visit (HOSPITAL_COMMUNITY)
Admission: RE | Admit: 2019-07-19 | Discharge: 2019-07-19 | Disposition: A | Discharge status: Home / Self Care | Payer: Medicare Other | Source: Ambulatory Visit | Attending: Nurse Practitioner | Admitting: Nurse Practitioner

## 2019-07-19 ENCOUNTER — Other Ambulatory Visit: Payer: Self-pay | Admitting: Family Medicine

## 2019-07-19 ENCOUNTER — Other Ambulatory Visit: Payer: Medicare Other

## 2019-07-19 DIAGNOSIS — M542 Cervicalgia: Secondary | ICD-10-CM | POA: Diagnosis not present

## 2019-07-19 DIAGNOSIS — E039 Hypothyroidism, unspecified: Secondary | ICD-10-CM | POA: Diagnosis not present

## 2019-07-19 DIAGNOSIS — G8929 Other chronic pain: Secondary | ICD-10-CM | POA: Diagnosis not present

## 2019-07-19 DIAGNOSIS — R519 Headache, unspecified: Secondary | ICD-10-CM | POA: Diagnosis not present

## 2019-07-19 DIAGNOSIS — I110 Hypertensive heart disease with heart failure: Secondary | ICD-10-CM | POA: Diagnosis not present

## 2019-07-19 DIAGNOSIS — I495 Sick sinus syndrome: Secondary | ICD-10-CM | POA: Diagnosis not present

## 2019-07-19 DIAGNOSIS — E782 Mixed hyperlipidemia: Secondary | ICD-10-CM | POA: Diagnosis not present

## 2019-07-19 DIAGNOSIS — I480 Paroxysmal atrial fibrillation: Secondary | ICD-10-CM | POA: Diagnosis not present

## 2019-07-19 DIAGNOSIS — J410 Simple chronic bronchitis: Secondary | ICD-10-CM | POA: Diagnosis not present

## 2019-07-19 DIAGNOSIS — Z7952 Long term (current) use of systemic steroids: Secondary | ICD-10-CM | POA: Diagnosis not present

## 2019-07-19 DIAGNOSIS — I5041 Acute combined systolic (congestive) and diastolic (congestive) heart failure: Secondary | ICD-10-CM | POA: Diagnosis not present

## 2019-07-19 DIAGNOSIS — S9031XD Contusion of right foot, subsequent encounter: Secondary | ICD-10-CM | POA: Diagnosis not present

## 2019-07-19 DIAGNOSIS — D649 Anemia, unspecified: Secondary | ICD-10-CM

## 2019-07-19 DIAGNOSIS — Z7901 Long term (current) use of anticoagulants: Secondary | ICD-10-CM | POA: Diagnosis not present

## 2019-07-19 LAB — LIPID PANEL
Chol/HDL Ratio: 2.2 ratio (ref 0.0–4.4)
Cholesterol, Total: 100 mg/dL (ref 100–199)
HDL: 45 mg/dL (ref >39)
LDL Chol Calc (NIH): 36 mg/dL (ref 0–99)
Triglycerides: 97 mg/dL (ref 0–149)
VLDL Cholesterol Cal: 19 mg/dL (ref 5–40)

## 2019-07-19 LAB — SPECIMEN STATUS REPORT

## 2019-07-20 ENCOUNTER — Other Ambulatory Visit: Payer: Self-pay | Admitting: Nurse Practitioner

## 2019-07-20 DIAGNOSIS — E039 Hypothyroidism, unspecified: Secondary | ICD-10-CM | POA: Diagnosis not present

## 2019-07-20 DIAGNOSIS — R519 Headache, unspecified: Secondary | ICD-10-CM | POA: Diagnosis not present

## 2019-07-20 DIAGNOSIS — E782 Mixed hyperlipidemia: Secondary | ICD-10-CM | POA: Diagnosis not present

## 2019-07-20 DIAGNOSIS — I495 Sick sinus syndrome: Secondary | ICD-10-CM | POA: Diagnosis not present

## 2019-07-20 DIAGNOSIS — Z7952 Long term (current) use of systemic steroids: Secondary | ICD-10-CM | POA: Diagnosis not present

## 2019-07-20 DIAGNOSIS — I5041 Acute combined systolic (congestive) and diastolic (congestive) heart failure: Secondary | ICD-10-CM | POA: Diagnosis not present

## 2019-07-20 DIAGNOSIS — I480 Paroxysmal atrial fibrillation: Secondary | ICD-10-CM | POA: Diagnosis not present

## 2019-07-20 DIAGNOSIS — G8929 Other chronic pain: Secondary | ICD-10-CM | POA: Diagnosis not present

## 2019-07-20 DIAGNOSIS — I110 Hypertensive heart disease with heart failure: Secondary | ICD-10-CM | POA: Diagnosis not present

## 2019-07-20 DIAGNOSIS — S9031XD Contusion of right foot, subsequent encounter: Secondary | ICD-10-CM | POA: Diagnosis not present

## 2019-07-20 DIAGNOSIS — D649 Anemia, unspecified: Secondary | ICD-10-CM | POA: Diagnosis not present

## 2019-07-20 DIAGNOSIS — Z7901 Long term (current) use of anticoagulants: Secondary | ICD-10-CM | POA: Diagnosis not present

## 2019-07-20 DIAGNOSIS — J410 Simple chronic bronchitis: Secondary | ICD-10-CM | POA: Diagnosis not present

## 2019-07-20 DIAGNOSIS — M542 Cervicalgia: Secondary | ICD-10-CM | POA: Diagnosis not present

## 2019-07-20 MED ORDER — MOMETASONE FURO-FORMOTEROL FUM 200-5 MCG/ACT IN AERO: 2.0000 | INHALATION_SPRAY | Freq: Two times a day (BID) | RESPIRATORY_TRACT | 0 refills | Status: AC

## 2019-07-21 ENCOUNTER — Other Ambulatory Visit: Payer: Self-pay | Admitting: Adult Health

## 2019-07-22 ENCOUNTER — Other Ambulatory Visit: Payer: Self-pay | Admitting: *Deleted

## 2019-07-22 ENCOUNTER — Other Ambulatory Visit: Payer: Self-pay | Admitting: Adult Health

## 2019-07-22 MED ORDER — AMIODARONE HCL 200 MG PO TABS: 200.0000 mg | ORAL_TABLET | ORAL | 0 refills | Status: DC

## 2019-07-23 ENCOUNTER — Ambulatory Visit (HOSPITAL_COMMUNITY): Payer: Medicare Other

## 2019-07-23 ENCOUNTER — Other Ambulatory Visit: Payer: Self-pay | Admitting: Nurse Practitioner

## 2019-07-23 DIAGNOSIS — I480 Paroxysmal atrial fibrillation: Secondary | ICD-10-CM | POA: Diagnosis not present

## 2019-07-23 DIAGNOSIS — G8929 Other chronic pain: Secondary | ICD-10-CM | POA: Diagnosis not present

## 2019-07-23 DIAGNOSIS — Z7901 Long term (current) use of anticoagulants: Secondary | ICD-10-CM | POA: Diagnosis not present

## 2019-07-23 DIAGNOSIS — E782 Mixed hyperlipidemia: Secondary | ICD-10-CM | POA: Diagnosis not present

## 2019-07-23 DIAGNOSIS — I5041 Acute combined systolic (congestive) and diastolic (congestive) heart failure: Secondary | ICD-10-CM | POA: Diagnosis not present

## 2019-07-23 DIAGNOSIS — S9031XD Contusion of right foot, subsequent encounter: Secondary | ICD-10-CM | POA: Diagnosis not present

## 2019-07-23 DIAGNOSIS — I495 Sick sinus syndrome: Secondary | ICD-10-CM | POA: Diagnosis not present

## 2019-07-23 DIAGNOSIS — J410 Simple chronic bronchitis: Secondary | ICD-10-CM | POA: Diagnosis not present

## 2019-07-23 DIAGNOSIS — I110 Hypertensive heart disease with heart failure: Secondary | ICD-10-CM | POA: Diagnosis not present

## 2019-07-23 DIAGNOSIS — E039 Hypothyroidism, unspecified: Secondary | ICD-10-CM | POA: Diagnosis not present

## 2019-07-23 DIAGNOSIS — R519 Headache, unspecified: Secondary | ICD-10-CM | POA: Diagnosis not present

## 2019-07-23 DIAGNOSIS — M542 Cervicalgia: Secondary | ICD-10-CM | POA: Diagnosis not present

## 2019-07-23 DIAGNOSIS — Z7952 Long term (current) use of systemic steroids: Secondary | ICD-10-CM | POA: Diagnosis not present

## 2019-07-24 ENCOUNTER — Other Ambulatory Visit: Payer: Self-pay

## 2019-07-24 ENCOUNTER — Telehealth: Payer: Self-pay | Admitting: Cardiology

## 2019-07-24 ENCOUNTER — Ambulatory Visit (INDEPENDENT_AMBULATORY_CARE_PROVIDER_SITE_OTHER): Payer: Medicare Other | Admitting: Cardiology

## 2019-07-24 VITALS — BP 128/69 | HR 83 | Temp 97.7°F | Ht 59.0 in | Wt 116.0 lb

## 2019-07-24 DIAGNOSIS — M6281 Muscle weakness (generalized): Secondary | ICD-10-CM | POA: Diagnosis not present

## 2019-07-24 DIAGNOSIS — E782 Mixed hyperlipidemia: Secondary | ICD-10-CM

## 2019-07-24 DIAGNOSIS — I480 Paroxysmal atrial fibrillation: Secondary | ICD-10-CM

## 2019-07-24 DIAGNOSIS — R531 Weakness: Secondary | ICD-10-CM

## 2019-07-24 DIAGNOSIS — I2510 Atherosclerotic heart disease of native coronary artery without angina pectoris: Secondary | ICD-10-CM

## 2019-07-24 MED ORDER — FUROSEMIDE 20 MG PO TABS: 40.0000 mg | ORAL_TABLET | Freq: Every day | ORAL | 3 refills | Status: AC

## 2019-07-24 MED ORDER — AMIODARONE HCL 200 MG PO TABS: ORAL_TABLET | ORAL | 3 refills | Status: AC

## 2019-07-24 MED ORDER — PRAVASTATIN SODIUM 20 MG PO TABS: 20.0000 mg | ORAL_TABLET | Freq: Every evening | ORAL | 3 refills | Status: AC

## 2019-07-24 NOTE — Patient Instructions (Signed)
Medication Instructions:  STOP ATORVASTATIN  INCREASE LASIX TO 40 MG DAILY   START PRAVASTATIN 20 MG DAILY   TAKE AMIODARONE 200 MG ALTERNATING WITH 100 MG DAILY   Labwork: 1 WEEK  BMET MAGNESIUM   Testing/Procedures: NONE  Follow-Up: Your physician recommends that you schedule a follow-up appointment in: 6 WEEKS    Any Other Special Instructions Will Be Listed Below (If Applicable).     If you need a refill on your cardiac medications before your next appointment, please call your pharmacy.

## 2019-07-24 NOTE — Progress Notes (Signed)
Clinical Summary Amy Chambers is a 85 y.o.female seen today for follow up of the following medical problems.    1. CAD - admited 04/2019 with NSTEMI. Actually presented with severe hypoxia and SOB after taking augmentin.   04/2019 cath: 80% prox RCA. DES x 2 to RCA - repeat cath after developed hypotension without signs of extravasation.  - echo post PCI showed moderate effusion and possible hematoma with signs of tampondarde.  - went to OR for pericardial evacuation for hemopericardium, found to have a posterior wall perforation without active bleeding - management limited by soft bp's  - no recent chest pain - some SOB.  - legs pain and weakness on atorvastatin.   2. COPD - some cough, some wheezing.  - pcp did prednisone burst.   3. Afib/Sick sinus - developed s/p surgery for hemopericardium - was on amio gtt during admission - management complicated by postcovernsino pauses 2 to 3.7 seconds. Seen by EP but no immediate indication for pacemaker  - no recent palpitations. No recent lightheadness or dizziness.  - off eliquis due to anemia  4. Anemia - downtrend in Hgb to 8.3 - eliquis held, continued on plavix - started on oral iron    Past Medical History:  Diagnosis Date  . Cataract   . COPD (chronic obstructive pulmonary disease) ([place])   . Dysphagia 05/31/2019  . GERD (gastroesophageal reflux disease)   . Hypertension   . Osteopenia   . Thyroid disease      Allergies  Allergen Reactions  . Crestor [Rosuvastatin Calcium] Other (See Comments)    Cramping/pain in lower extremeties  . Niaspan [Niacin Er] Other (See Comments)    Cramping/pain in lower extremeties     Current Outpatient Medications  Medication Sig Dispense Refill  . acetaminophen (TYLENOL) 500 MG tablet Take 500 mg by mouth 3 (three) times daily.    Marland Kitchen amiodarone (PACERONE) 100 MG tablet Take 1 tablet (100 mg total) by mouth every Tuesday, Thursday, Saturday, and Sunday. 30 tablet 0  .  amiodarone (PACERONE) 200 MG tablet Take 1 tablet (200 mg total) by mouth every Monday, Wednesday, and Friday. 30 tablet 0  . apixaban (ELIQUIS) 2.5 MG TABS tablet Take 1 tablet (2.5 mg total) by mouth 2 (two) times daily. 60 tablet 0  . atorvastatin (LIPITOR) 40 MG tablet TAKE 1 TABLET BY MOUTH ONCE DAILY AT  6  PM 90 tablet 0  . baclofen (LIORESAL) 10 MG tablet Take 1 tablet (10 mg total) by mouth 3 (three) times daily. 30 each 0  . bisacodyl (DULCOLAX) 10 MG suppository Place 1 suppository (10 mg total) rectally daily as needed for moderate constipation. 12 suppository 0  . calcium-vitamin D 250-100 MG-UNIT per tablet Take 1 tablet by mouth 2 (two) times daily.     . clopidogrel (PLAVIX) 75 MG tablet Take 1 tablet (75 mg total) by mouth daily. 30 tablet 0  . diclofenac sodium (VOLTAREN) 1 % GEL Apply 4 g topically 4 (four) times daily. 350 g 2  . diphenhydrAMINE-zinc acetate (BENADRYL) cream Apply topically 2 (two) times daily as needed for itching. 28.4 g 0  . Fe Fum-FA-B Cmp-C-Zn-Mg-Mn-Cu (HEMOCYTE PLUS) 106-1 MG CAPS Take 1 tablet by mouth daily. 30 capsule 3  . furosemide (LASIX) 20 MG tablet Take 1 tablet (20 mg total) by mouth daily. 30 tablet 0  . guaiFENesin (MUCINEX) 600 MG 12 hr tablet Take 1 tablet (600 mg total) by mouth 2 (two) times daily.    Marland Kitchen  levothyroxine (SYNTHROID) 100 MCG tablet Take 1 tablet (100 mcg total) by mouth daily. 30 tablet 11  . lidocaine (LIDODERM) 5 % Place 1 patch onto the skin daily. Remove & Discard patch within 12 hours or as directed by MD 30 patch 0  . loratadine (CLARITIN) 10 MG tablet Take 1 tablet (10 mg total) by mouth daily.    Marland Kitchen LORazepam (ATIVAN) 0.5 MG tablet Take 2 tablets (1 mg total) by mouth every 8 (eight) hours as needed (muscle spasm). 20 tablet 0  . mometasone-formoterol (DULERA) 200-5 MCG/ACT AERO Inhale 2 puffs into the lungs 2 (two) times daily. 8.8 g 0  . Multiple Vitamin (MULTIVITAMIN WITH MINERALS) TABS tablet Take 1 tablet by mouth  daily.    . pantoprazole (PROTONIX) 40 MG tablet Take 1 tablet (40 mg total) by mouth 2 (two) times daily. 60 tablet 0  . polyethylene glycol (MIRALAX / GLYCOLAX) packet Take 17 g by mouth daily.    . predniSONE (STERAPRED UNI-PAK 21 TAB) 10 MG (21) TBPK tablet As directed x 6 days 21 tablet 0  . traMADol-acetaminophen (ULTRACET) 37.5-325 MG tablet Take 1 tablet by mouth every 6 (six) hours as needed for moderate pain. 20 tablet 0  . Vitamin D, Cholecalciferol, 25 MCG (1000 UT) TABS Take 1 tablet by mouth daily.     No current facility-administered medications for this visit.      Past Surgical History:  Procedure Laterality Date  . CATARACT EXTRACTION, BILATERAL    . CORONARY STENT INTERVENTION N/A 05/14/2019   Procedure: CORONARY STENT INTERVENTION;  Surgeon: Lennette Bihari, MD;  Location: [facility] INVASIVE CV LAB;  Service: Cardiovascular;  Laterality: N/A;  . LEFT HEART CATH AND CORONARY ANGIOGRAPHY N/A 05/14/2019   Procedure: LEFT HEART CATH AND CORONARY ANGIOGRAPHY;  Surgeon: Marykay Lex, MD;  Location: [facility] INVASIVE CV LAB;  Service: Cardiovascular;  Laterality: N/A;  . LEFT HEART CATH AND CORONARY ANGIOGRAPHY N/A 05/14/2019   Procedure: LEFT HEART CATH AND CORONARY ANGIOGRAPHY;  Surgeon: Lennette Bihari, MD;  Location: [facility] INVASIVE CV LAB;  Service: Cardiovascular;  Laterality: N/A;  . MEDIASTINAL EXPLORATION N/A 05/14/2019   Procedure: Mediastinal Exploration with Washout;  Surgeon: Corliss Skains, MD;  Location: [facility] OR;  Service: Open Heart Surgery;  Laterality: N/A;     Allergies  Allergen Reactions  . Crestor [Rosuvastatin Calcium] Other (See Comments)    Cramping/pain in lower extremeties  . Niaspan [Niacin Er] Other (See Comments)    Cramping/pain in lower extremeties      Family History  Problem Relation Age of Onset  . Diabetes Mother   . Cancer Brother   . Tuberculosis Father   . Early death Sister   . Early death Brother   . COPD Brother   . Emphysema  Brother   . Early death Sister   . COPD Sister   . Emphysema Sister   . Aneurysm Sister   . Stroke Sister   . Cancer Sister   . Hypertension Son   . Hypertension Daughter   . Fibromyalgia Daughter   . Hypertension Daughter      Social History Ms. Chong reports that she quit smoking about 24 years ago. She quit smokeless tobacco use about 20 years ago.  Her smokeless tobacco use included chew. Ms. Ask reports no history of alcohol use.   Review of Systems CONSTITUTIONAL: No weight loss, fever, chills, weakness or fatigue.  HEENT: Eyes: No visual loss, blurred vision, double vision or yellow sclerae.No hearing  loss, sneezing, congestion, runny nose or sore throat.  SKIN: No rash or itching.  CARDIOVASCULAR: per hpi RESPIRATORY: No shortness of breath, cough or sputum.  GASTROINTESTINAL: No anorexia, nausea, vomiting or diarrhea. No abdominal pain or blood.  GENITOURINARY: No burning on urination, no polyuria NEUROLOGICAL: No headache, dizziness, syncope, paralysis, ataxia, numbness or tingling in the extremities. No change in bowel or bladder control.  MUSCULOSKELETAL: No muscle, back pain, joint pain or stiffness.  LYMPHATICS: No enlarged nodes. No history of splenectomy.  PSYCHIATRIC: No history of depression or anxiety.  ENDOCRINOLOGIC: No reports of sweating, cold or heat intolerance. No polyuria or polydipsia.  Marland Kitchen.   Physical Examination Today's Vitals   07/24/19 1532  BP: 128/69  Pulse: 83  Temp: 97.7 F (36.5 C)  SpO2: 96%  Weight: 116 lb (52.6 kg)  Height: 4\' 11"  (1.499 m)   Body mass index is 23.43 kg/m.  Gen: resting comfortably, no acute distress HEENT: no scleral icterus, pupils equal round and reactive, no palptable cervical adenopathy,  CV: RRR, no m/r/g, no jvd Resp: Clear to auscultation bilaterally GI: abdomen is soft, non-tender, non-distended, normal bowel sounds, no hepatosplenomegaly MSK: extremities are warm, trace bilateral eedema Skin: warm,  no rash Neuro:  no focal deficits Psych: appropriate affect   Assessment and Plan  1. CAD - no recent symptoms, cotinue current meds  2. Afib/sick sinus - no recent symptoms - off anyticoag due to issues with anemia and GI bleed - continue amiodarone - no indication for monitor at this time.   3. Hyperlipidemia - possible side effects on atorvastatin, will try pravastatin 20mg  daily.        Amy Chambers, M.D.

## 2019-07-24 NOTE — Telephone Encounter (Signed)

## 2019-07-27 DIAGNOSIS — I495 Sick sinus syndrome: Secondary | ICD-10-CM | POA: Diagnosis not present

## 2019-07-29 ENCOUNTER — Other Ambulatory Visit: Payer: Self-pay | Admitting: Nurse Practitioner

## 2019-07-29 ENCOUNTER — Other Ambulatory Visit: Payer: Medicare Other

## 2019-07-29 ENCOUNTER — Other Ambulatory Visit: Payer: Self-pay

## 2019-07-29 DIAGNOSIS — R000 Tachycardia, unspecified: Secondary | ICD-10-CM

## 2019-07-29 DIAGNOSIS — R519 Headache, unspecified: Secondary | ICD-10-CM | POA: Diagnosis not present

## 2019-07-29 DIAGNOSIS — S9031XD Contusion of right foot, subsequent encounter: Secondary | ICD-10-CM | POA: Diagnosis not present

## 2019-07-29 DIAGNOSIS — E039 Hypothyroidism, unspecified: Secondary | ICD-10-CM | POA: Diagnosis not present

## 2019-07-29 DIAGNOSIS — I480 Paroxysmal atrial fibrillation: Secondary | ICD-10-CM | POA: Diagnosis not present

## 2019-07-29 DIAGNOSIS — E782 Mixed hyperlipidemia: Secondary | ICD-10-CM | POA: Diagnosis not present

## 2019-07-29 DIAGNOSIS — I5041 Acute combined systolic (congestive) and diastolic (congestive) heart failure: Secondary | ICD-10-CM | POA: Diagnosis not present

## 2019-07-29 DIAGNOSIS — I110 Hypertensive heart disease with heart failure: Secondary | ICD-10-CM | POA: Diagnosis not present

## 2019-07-29 DIAGNOSIS — J410 Simple chronic bronchitis: Secondary | ICD-10-CM | POA: Diagnosis not present

## 2019-07-29 DIAGNOSIS — G8929 Other chronic pain: Secondary | ICD-10-CM | POA: Diagnosis not present

## 2019-07-29 DIAGNOSIS — Z7952 Long term (current) use of systemic steroids: Secondary | ICD-10-CM | POA: Diagnosis not present

## 2019-07-29 DIAGNOSIS — M542 Cervicalgia: Secondary | ICD-10-CM | POA: Diagnosis not present

## 2019-07-29 DIAGNOSIS — Z7901 Long term (current) use of anticoagulants: Secondary | ICD-10-CM | POA: Diagnosis not present

## 2019-07-29 DIAGNOSIS — I495 Sick sinus syndrome: Secondary | ICD-10-CM | POA: Diagnosis not present

## 2019-07-30 LAB — CBC WITH DIFFERENTIAL/PLATELET
Basophils Absolute: 0 10*3/uL (ref 0.0–0.2)
Basos: 0 %
EOS (ABSOLUTE): 0 10*3/uL (ref 0.0–0.4)
Eos: 0 %
Hematocrit: 31.9 % — ABNORMAL LOW (ref 34.0–46.6)
Hemoglobin: 10.4 g/dL — ABNORMAL LOW (ref 11.1–15.9)
Immature Grans (Abs): 0.1 10*3/uL (ref 0.0–0.1)
Immature Granulocytes: 1 %
Lymphocytes Absolute: 0.5 10*3/uL — ABNORMAL LOW (ref 0.7–3.1)
Lymphs: 3 %
MCH: 33 pg (ref 26.6–33.0)
MCHC: 32.6 g/dL (ref 31.5–35.7)
MCV: 101 fL — ABNORMAL HIGH (ref 79–97)
Monocytes Absolute: 1 10*3/uL — ABNORMAL HIGH (ref 0.1–0.9)
Monocytes: 6 %
Neutrophils Absolute: 15.3 10*3/uL — ABNORMAL HIGH (ref 1.4–7.0)
Neutrophils: 90 %
Platelets: 319 10*3/uL (ref 150–450)
RBC: 3.15 x10E6/uL — ABNORMAL LOW (ref 3.77–5.28)
RDW: 14.9 % (ref 11.7–15.4)
WBC: 17 10*3/uL — ABNORMAL HIGH (ref 3.4–10.8)

## 2019-07-30 LAB — CMP14+EGFR
ALT: 22 IU/L (ref 0–32)
AST: 29 IU/L (ref 0–40)
Albumin/Globulin Ratio: 1.7 (ref 1.2–2.2)
Albumin: 3.3 g/dL — ABNORMAL LOW (ref 3.6–4.6)
Alkaline Phosphatase: 90 IU/L (ref 39–117)
BUN/Creatinine Ratio: 31 — ABNORMAL HIGH (ref 12–28)
BUN: 22 mg/dL (ref 8–27)
Bilirubin Total: 0.4 mg/dL (ref 0.0–1.2)
CO2: 22 mmol/L (ref 20–29)
Calcium: 8.4 mg/dL — ABNORMAL LOW (ref 8.7–10.3)
Chloride: 102 mmol/L (ref 96–106)
Creatinine, Ser: 0.71 mg/dL (ref 0.57–1.00)
GFR calc Af Amer: 87 mL/min/{1.73_m2} (ref >59)
GFR calc non Af Amer: 76 mL/min/{1.73_m2} (ref >59)
Globulin, Total: 2 g/dL (ref 1.5–4.5)
Glucose: 114 mg/dL — ABNORMAL HIGH (ref 65–99)
Potassium: 4.3 mmol/L (ref 3.5–5.2)
Sodium: 139 mmol/L (ref 134–144)
Total Protein: 5.3 g/dL — ABNORMAL LOW (ref 6.0–8.5)

## 2019-07-31 ENCOUNTER — Other Ambulatory Visit: Payer: Self-pay | Admitting: Nurse Practitioner

## 2019-07-31 MED ORDER — CEPHALEXIN 500 MG PO CAPS: 500.0000 mg | ORAL_CAPSULE | Freq: Three times a day (TID) | ORAL | 0 refills | Status: DC

## 2019-08-01 ENCOUNTER — Other Ambulatory Visit: Payer: Medicare Other

## 2019-08-01 ENCOUNTER — Telehealth: Payer: Self-pay | Admitting: Nurse Practitioner

## 2019-08-01 DIAGNOSIS — D72829 Elevated white blood cell count, unspecified: Secondary | ICD-10-CM

## 2019-08-01 LAB — URINALYSIS, COMPLETE
Bilirubin, UA: NEGATIVE
Glucose, UA: NEGATIVE
Nitrite, UA: POSITIVE — AB
RBC, UA: NEGATIVE
Specific Gravity, UA: 1.02 (ref 1.005–1.030)
Urobilinogen, Ur: 0.2 mg/dL (ref 0.2–1.0)
pH, UA: 5 (ref 5.0–7.5)

## 2019-08-01 LAB — MICROSCOPIC EXAMINATION
RBC, Urine: NONE SEEN /hpf (ref 0–2)
Renal Epithel, UA: NONE SEEN /hpf

## 2019-08-01 MED ORDER — AZITHROMYCIN 250 MG PO TABS: ORAL_TABLET | ORAL | 0 refills | Status: AC

## 2019-08-01 NOTE — Telephone Encounter (Signed)
Actually made a house call to listen to patients lungs today. She had CBC drawn nd WBC were 17.4. she had had some confusion. She was started on keflex yesterday for urine coverage. Her lungs sounds were diminished in left lower lobe and she was warm to touch. Has no energy to seat up. Sent in z pak rx for pneumonia coverage. Will hold keflex ntil urine results are back.

## 2019-08-02 ENCOUNTER — Other Ambulatory Visit: Payer: Self-pay

## 2019-08-02 DIAGNOSIS — I480 Paroxysmal atrial fibrillation: Secondary | ICD-10-CM | POA: Diagnosis not present

## 2019-08-02 DIAGNOSIS — Z7901 Long term (current) use of anticoagulants: Secondary | ICD-10-CM | POA: Diagnosis not present

## 2019-08-02 DIAGNOSIS — E782 Mixed hyperlipidemia: Secondary | ICD-10-CM | POA: Diagnosis not present

## 2019-08-02 DIAGNOSIS — M542 Cervicalgia: Secondary | ICD-10-CM | POA: Diagnosis not present

## 2019-08-02 DIAGNOSIS — I5041 Acute combined systolic (congestive) and diastolic (congestive) heart failure: Secondary | ICD-10-CM | POA: Diagnosis not present

## 2019-08-02 DIAGNOSIS — E039 Hypothyroidism, unspecified: Secondary | ICD-10-CM | POA: Diagnosis not present

## 2019-08-02 DIAGNOSIS — I110 Hypertensive heart disease with heart failure: Secondary | ICD-10-CM | POA: Diagnosis not present

## 2019-08-02 DIAGNOSIS — G8929 Other chronic pain: Secondary | ICD-10-CM | POA: Diagnosis not present

## 2019-08-02 DIAGNOSIS — I495 Sick sinus syndrome: Secondary | ICD-10-CM | POA: Diagnosis not present

## 2019-08-02 DIAGNOSIS — Z7952 Long term (current) use of systemic steroids: Secondary | ICD-10-CM | POA: Diagnosis not present

## 2019-08-02 DIAGNOSIS — J410 Simple chronic bronchitis: Secondary | ICD-10-CM | POA: Diagnosis not present

## 2019-08-02 DIAGNOSIS — S9031XD Contusion of right foot, subsequent encounter: Secondary | ICD-10-CM | POA: Diagnosis not present

## 2019-08-02 DIAGNOSIS — R519 Headache, unspecified: Secondary | ICD-10-CM | POA: Diagnosis not present

## 2019-08-03 LAB — URINE CULTURE

## 2019-08-04 ENCOUNTER — Other Ambulatory Visit: Payer: Self-pay | Admitting: Family Medicine

## 2019-08-05 ENCOUNTER — Other Ambulatory Visit: Payer: Self-pay | Admitting: Nurse Practitioner

## 2019-08-05 MED ORDER — PANTOPRAZOLE SODIUM 40 MG PO TBEC: 40.0000 mg | DELAYED_RELEASE_TABLET | Freq: Two times a day (BID) | ORAL | 0 refills | Status: AC

## 2019-08-05 MED ORDER — CLOPIDOGREL BISULFATE 75 MG PO TABS: 75.0000 mg | ORAL_TABLET | Freq: Every day | ORAL | 0 refills | Status: AC

## 2019-08-06 ENCOUNTER — Telehealth: Payer: Self-pay | Admitting: Nurse Practitioner

## 2019-08-06 DIAGNOSIS — E039 Hypothyroidism, unspecified: Secondary | ICD-10-CM | POA: Diagnosis not present

## 2019-08-06 DIAGNOSIS — J410 Simple chronic bronchitis: Secondary | ICD-10-CM | POA: Diagnosis not present

## 2019-08-06 DIAGNOSIS — I495 Sick sinus syndrome: Secondary | ICD-10-CM | POA: Diagnosis not present

## 2019-08-06 DIAGNOSIS — G8929 Other chronic pain: Secondary | ICD-10-CM | POA: Diagnosis not present

## 2019-08-06 DIAGNOSIS — Z7952 Long term (current) use of systemic steroids: Secondary | ICD-10-CM | POA: Diagnosis not present

## 2019-08-06 DIAGNOSIS — I110 Hypertensive heart disease with heart failure: Secondary | ICD-10-CM | POA: Diagnosis not present

## 2019-08-06 DIAGNOSIS — S9031XD Contusion of right foot, subsequent encounter: Secondary | ICD-10-CM | POA: Diagnosis not present

## 2019-08-06 DIAGNOSIS — E782 Mixed hyperlipidemia: Secondary | ICD-10-CM | POA: Diagnosis not present

## 2019-08-06 DIAGNOSIS — M542 Cervicalgia: Secondary | ICD-10-CM | POA: Diagnosis not present

## 2019-08-06 DIAGNOSIS — I5041 Acute combined systolic (congestive) and diastolic (congestive) heart failure: Secondary | ICD-10-CM | POA: Diagnosis not present

## 2019-08-06 DIAGNOSIS — I480 Paroxysmal atrial fibrillation: Secondary | ICD-10-CM | POA: Diagnosis not present

## 2019-08-06 DIAGNOSIS — R519 Headache, unspecified: Secondary | ICD-10-CM | POA: Diagnosis not present

## 2019-08-06 DIAGNOSIS — Z7901 Long term (current) use of anticoagulants: Secondary | ICD-10-CM | POA: Diagnosis not present

## 2019-08-06 NOTE — Telephone Encounter (Signed)
Arbie Cookey from [place] requested that [facility] call her back regarding patient. Phone# [phone]

## 2019-08-07 ENCOUNTER — Telehealth: Payer: Self-pay | Admitting: Nurse Practitioner

## 2019-08-07 ENCOUNTER — Other Ambulatory Visit: Payer: Self-pay | Admitting: Family Medicine

## 2019-08-07 DIAGNOSIS — J441 Chronic obstructive pulmonary disease with (acute) exacerbation: Secondary | ICD-10-CM

## 2019-08-07 DIAGNOSIS — J449 Chronic obstructive pulmonary disease, unspecified: Secondary | ICD-10-CM | POA: Diagnosis not present

## 2019-08-07 MED ORDER — ALBUTEROL SULFATE 0.63 MG/3ML IN NEBU: 0.6300 mg | INHALATION_SOLUTION | Freq: Once | RESPIRATORY_TRACT | Status: AC

## 2019-08-07 MED ORDER — DOXYCYCLINE HYCLATE 100 MG PO TABS: 100.0000 mg | ORAL_TABLET | Freq: Two times a day (BID) | ORAL | 0 refills | Status: AC

## 2019-08-07 MED ORDER — PREDNISONE 10 MG (21) PO TBPK: ORAL_TABLET | ORAL | 0 refills | Status: AC

## 2019-08-07 NOTE — Telephone Encounter (Signed)
Home health nurse Patient was put on abx for pneumonia. Nurse states both lungs sounds congested. Can she call in nebulizer needs machine and meds for it, steroid and refill of zpak. Patients daughter did test positive for COVID last week. [facility] [place]. Covering pcp

## 2019-08-07 NOTE — Telephone Encounter (Signed)
RX sent to pharmacy for neb solution and machine along with prednisone and doxycycline

## 2019-08-07 NOTE — Telephone Encounter (Signed)
I have already sent this to pharmacy this morning. I sent in doxy, not another zpack. Steroids and nebs sent to pharmacy.

## 2019-08-07 NOTE — Telephone Encounter (Signed)
Patient aware and verbalized understanding. °

## 2019-08-07 NOTE — Telephone Encounter (Signed)
Pt finished Zpak, congestion is worse Would like nebulizer & meds, Steroid dose pack & refill on Zpak

## 2019-08-08 ENCOUNTER — Other Ambulatory Visit: Payer: Self-pay | Admitting: Nurse Practitioner

## 2019-08-08 MED ORDER — ALBUTEROL SULFATE (2.5 MG/3ML) 0.083% IN NEBU: 2.5000 mg | INHALATION_SOLUTION | Freq: Four times a day (QID) | RESPIRATORY_TRACT | 12 refills | Status: AC | PRN

## 2019-08-11 ENCOUNTER — Other Ambulatory Visit: Payer: Self-pay

## 2019-08-11 ENCOUNTER — Encounter (HOSPITAL_COMMUNITY): Payer: Self-pay

## 2019-08-11 ENCOUNTER — Inpatient Hospital Stay (HOSPITAL_COMMUNITY)
Admission: EM | Admit: 2019-08-11 | Discharge: 2019-09-16 | DRG: 177 | Disposition: E | Payer: Medicare Other | Attending: Internal Medicine | Admitting: Internal Medicine

## 2019-08-11 ENCOUNTER — Emergency Department (HOSPITAL_COMMUNITY): Payer: Medicare Other

## 2019-08-11 DIAGNOSIS — D649 Anemia, unspecified: Secondary | ICD-10-CM | POA: Diagnosis present

## 2019-08-11 DIAGNOSIS — Z825 Family history of asthma and other chronic lower respiratory diseases: Secondary | ICD-10-CM

## 2019-08-11 DIAGNOSIS — E43 Unspecified severe protein-calorie malnutrition: Secondary | ICD-10-CM | POA: Diagnosis not present

## 2019-08-11 DIAGNOSIS — Z8249 Family history of ischemic heart disease and other diseases of the circulatory system: Secondary | ICD-10-CM

## 2019-08-11 DIAGNOSIS — E039 Hypothyroidism, unspecified: Secondary | ICD-10-CM | POA: Diagnosis not present

## 2019-08-11 DIAGNOSIS — Z862 Personal history of diseases of the blood and blood-forming organs and certain disorders involving the immune mechanism: Secondary | ICD-10-CM

## 2019-08-11 DIAGNOSIS — I480 Paroxysmal atrial fibrillation: Secondary | ICD-10-CM | POA: Diagnosis present

## 2019-08-11 DIAGNOSIS — I5032 Chronic diastolic (congestive) heart failure: Secondary | ICD-10-CM | POA: Diagnosis not present

## 2019-08-11 DIAGNOSIS — K219 Gastro-esophageal reflux disease without esophagitis: Secondary | ICD-10-CM | POA: Diagnosis not present

## 2019-08-11 DIAGNOSIS — M79605 Pain in left leg: Secondary | ICD-10-CM | POA: Diagnosis not present

## 2019-08-11 DIAGNOSIS — I998 Other disorder of circulatory system: Secondary | ICD-10-CM

## 2019-08-11 DIAGNOSIS — M8580 Other specified disorders of bone density and structure, unspecified site: Secondary | ICD-10-CM | POA: Diagnosis present

## 2019-08-11 DIAGNOSIS — Z743 Need for continuous supervision: Secondary | ICD-10-CM | POA: Diagnosis not present

## 2019-08-11 DIAGNOSIS — J9600 Acute respiratory failure, unspecified whether with hypoxia or hypercapnia: Secondary | ICD-10-CM | POA: Diagnosis present

## 2019-08-11 DIAGNOSIS — I2510 Atherosclerotic heart disease of native coronary artery without angina pectoris: Secondary | ICD-10-CM | POA: Diagnosis not present

## 2019-08-11 DIAGNOSIS — Z66 Do not resuscitate: Secondary | ICD-10-CM | POA: Diagnosis not present

## 2019-08-11 DIAGNOSIS — N179 Acute kidney failure, unspecified: Secondary | ICD-10-CM | POA: Diagnosis not present

## 2019-08-11 DIAGNOSIS — J441 Chronic obstructive pulmonary disease with (acute) exacerbation: Secondary | ICD-10-CM | POA: Diagnosis present

## 2019-08-11 DIAGNOSIS — R918 Other nonspecific abnormal finding of lung field: Secondary | ICD-10-CM | POA: Diagnosis not present

## 2019-08-11 DIAGNOSIS — I252 Old myocardial infarction: Secondary | ICD-10-CM

## 2019-08-11 DIAGNOSIS — R0902 Hypoxemia: Secondary | ICD-10-CM

## 2019-08-11 DIAGNOSIS — R1310 Dysphagia, unspecified: Secondary | ICD-10-CM | POA: Diagnosis not present

## 2019-08-11 DIAGNOSIS — G9341 Metabolic encephalopathy: Secondary | ICD-10-CM | POA: Diagnosis not present

## 2019-08-11 DIAGNOSIS — I5042 Chronic combined systolic (congestive) and diastolic (congestive) heart failure: Secondary | ICD-10-CM | POA: Diagnosis not present

## 2019-08-11 DIAGNOSIS — Z7951 Long term (current) use of inhaled steroids: Secondary | ICD-10-CM

## 2019-08-11 DIAGNOSIS — Z888 Allergy status to other drugs, medicaments and biological substances status: Secondary | ICD-10-CM

## 2019-08-11 DIAGNOSIS — Z823 Family history of stroke: Secondary | ICD-10-CM

## 2019-08-11 DIAGNOSIS — U071 COVID-19: Secondary | ICD-10-CM | POA: Diagnosis not present

## 2019-08-11 DIAGNOSIS — Z833 Family history of diabetes mellitus: Secondary | ICD-10-CM

## 2019-08-11 DIAGNOSIS — J1289 Other viral pneumonia: Secondary | ICD-10-CM | POA: Diagnosis not present

## 2019-08-11 DIAGNOSIS — Z87891 Personal history of nicotine dependence: Secondary | ICD-10-CM | POA: Diagnosis not present

## 2019-08-11 DIAGNOSIS — E876 Hypokalemia: Secondary | ICD-10-CM

## 2019-08-11 DIAGNOSIS — R069 Unspecified abnormalities of breathing: Secondary | ICD-10-CM | POA: Diagnosis not present

## 2019-08-11 DIAGNOSIS — J1282 Pneumonia due to coronavirus disease 2019: Secondary | ICD-10-CM | POA: Diagnosis present

## 2019-08-11 DIAGNOSIS — R5381 Other malaise: Secondary | ICD-10-CM | POA: Diagnosis present

## 2019-08-11 DIAGNOSIS — I110 Hypertensive heart disease with heart failure: Secondary | ICD-10-CM | POA: Diagnosis not present

## 2019-08-11 DIAGNOSIS — J440 Chronic obstructive pulmonary disease with acute lower respiratory infection: Secondary | ICD-10-CM | POA: Diagnosis present

## 2019-08-11 DIAGNOSIS — R0602 Shortness of breath: Secondary | ICD-10-CM | POA: Diagnosis not present

## 2019-08-11 DIAGNOSIS — E860 Dehydration: Secondary | ICD-10-CM | POA: Diagnosis present

## 2019-08-11 DIAGNOSIS — R0689 Other abnormalities of breathing: Secondary | ICD-10-CM | POA: Diagnosis not present

## 2019-08-11 DIAGNOSIS — Z515 Encounter for palliative care: Secondary | ICD-10-CM | POA: Diagnosis not present

## 2019-08-11 DIAGNOSIS — Z79899 Other long term (current) drug therapy: Secondary | ICD-10-CM

## 2019-08-11 DIAGNOSIS — Z9981 Dependence on supplemental oxygen: Secondary | ICD-10-CM

## 2019-08-11 DIAGNOSIS — Z7901 Long term (current) use of anticoagulants: Secondary | ICD-10-CM

## 2019-08-11 DIAGNOSIS — Z6822 Body mass index (BMI) 22.0-22.9, adult: Secondary | ICD-10-CM | POA: Diagnosis not present

## 2019-08-11 DIAGNOSIS — Z831 Family history of other infectious and parasitic diseases: Secondary | ICD-10-CM

## 2019-08-11 DIAGNOSIS — J9601 Acute respiratory failure with hypoxia: Secondary | ICD-10-CM | POA: Diagnosis not present

## 2019-08-11 DIAGNOSIS — J189 Pneumonia, unspecified organism: Secondary | ICD-10-CM | POA: Diagnosis not present

## 2019-08-11 DIAGNOSIS — E870 Hyperosmolality and hypernatremia: Secondary | ICD-10-CM | POA: Diagnosis not present

## 2019-08-11 DIAGNOSIS — I97610 Postprocedural hemorrhage and hematoma of a circulatory system organ or structure following a cardiac catheterization: Secondary | ICD-10-CM | POA: Diagnosis not present

## 2019-08-11 DIAGNOSIS — R9431 Abnormal electrocardiogram [ECG] [EKG]: Secondary | ICD-10-CM | POA: Diagnosis not present

## 2019-08-11 DIAGNOSIS — R1909 Other intra-abdominal and pelvic swelling, mass and lump: Secondary | ICD-10-CM | POA: Diagnosis not present

## 2019-08-11 DIAGNOSIS — Z79890 Hormone replacement therapy (postmenopausal): Secondary | ICD-10-CM

## 2019-08-11 DIAGNOSIS — Z955 Presence of coronary angioplasty implant and graft: Secondary | ICD-10-CM | POA: Diagnosis not present

## 2019-08-11 DIAGNOSIS — Z7902 Long term (current) use of antithrombotics/antiplatelets: Secondary | ICD-10-CM

## 2019-08-11 LAB — CBC WITH DIFFERENTIAL/PLATELET
Abs Immature Granulocytes: 0.2 10*3/uL — ABNORMAL HIGH (ref 0.00–0.07)
Basophils Absolute: 0 10*3/uL (ref 0.0–0.1)
Basophils Relative: 0 %
Eosinophils Absolute: 0 10*3/uL (ref 0.0–0.5)
Eosinophils Relative: 0 %
HCT: 37.2 % (ref 36.0–46.0)
Hemoglobin: 11.2 g/dL — ABNORMAL LOW (ref 12.0–15.0)
Immature Granulocytes: 2 %
Lymphocytes Relative: 3 %
Lymphs Abs: 0.3 10*3/uL — ABNORMAL LOW (ref 0.7–4.0)
MCH: 31.2 pg (ref 26.0–34.0)
MCHC: 30.1 g/dL (ref 30.0–36.0)
MCV: 103.6 fL — ABNORMAL HIGH (ref 80.0–100.0)
Monocytes Absolute: 0.8 10*3/uL (ref 0.1–1.0)
Monocytes Relative: 6 %
Neutro Abs: 11.9 10*3/uL — ABNORMAL HIGH (ref 1.7–7.7)
Neutrophils Relative %: 89 %
Platelets: 551 10*3/uL — ABNORMAL HIGH (ref 150–400)
RBC: 3.59 MIL/uL — ABNORMAL LOW (ref 3.87–5.11)
RDW: 17.2 % — ABNORMAL HIGH (ref 11.5–15.5)
WBC: 13.3 10*3/uL — ABNORMAL HIGH (ref 4.0–10.5)
nRBC: 0 % (ref 0.0–0.2)

## 2019-08-11 LAB — COMPREHENSIVE METABOLIC PANEL
ALT: 31 U/L (ref 0–44)
AST: 73 U/L — ABNORMAL HIGH (ref 15–41)
Albumin: 3.2 g/dL — ABNORMAL LOW (ref 3.5–5.0)
Alkaline Phosphatase: 84 U/L (ref 38–126)
Anion gap: 14 (ref 5–15)
BUN: 21 mg/dL (ref 8–23)
CO2: 33 mmol/L — ABNORMAL HIGH (ref 22–32)
Calcium: 9.5 mg/dL (ref 8.9–10.3)
Chloride: 91 mmol/L — ABNORMAL LOW (ref 98–111)
Creatinine, Ser: 1.03 mg/dL — ABNORMAL HIGH (ref 0.44–1.00)
GFR calc Af Amer: 56 mL/min — ABNORMAL LOW (ref >60)
GFR calc non Af Amer: 48 mL/min — ABNORMAL LOW (ref >60)
Glucose, Bld: 165 mg/dL — ABNORMAL HIGH (ref 70–99)
Potassium: 3.1 mmol/L — ABNORMAL LOW (ref 3.5–5.1)
Sodium: 138 mmol/L (ref 135–145)
Total Bilirubin: 1.2 mg/dL (ref 0.3–1.2)
Total Protein: 6.6 g/dL (ref 6.5–8.1)

## 2019-08-11 LAB — LACTATE DEHYDROGENASE: LDH: 336 U/L — ABNORMAL HIGH (ref 98–192)

## 2019-08-11 LAB — LACTIC ACID, PLASMA: Lactic Acid, Venous: 3.2 mmol/L (ref 0.5–1.9)

## 2019-08-11 LAB — C-REACTIVE PROTEIN: CRP: 1.7 mg/dL — ABNORMAL HIGH (ref <1.0)

## 2019-08-11 LAB — TRIGLYCERIDES: Triglycerides: 179 mg/dL — ABNORMAL HIGH (ref <150)

## 2019-08-11 LAB — POC SARS CORONAVIRUS 2 AG -  ED: SARS Coronavirus 2 Ag: POSITIVE — AB

## 2019-08-11 LAB — FERRITIN: Ferritin: 597 ng/mL — ABNORMAL HIGH (ref 11–307)

## 2019-08-11 LAB — FIBRINOGEN: Fibrinogen: 534 mg/dL — ABNORMAL HIGH (ref 210–475)

## 2019-08-11 LAB — BRAIN NATRIURETIC PEPTIDE: B Natriuretic Peptide: 1838 pg/mL — ABNORMAL HIGH (ref 0.0–100.0)

## 2019-08-11 LAB — PROCALCITONIN: Procalcitonin: 0.41 ng/mL

## 2019-08-11 LAB — MAGNESIUM: Magnesium: 2.8 mg/dL — ABNORMAL HIGH (ref 1.7–2.4)

## 2019-08-11 LAB — D-DIMER, QUANTITATIVE: D-Dimer, Quant: 2.98 ug/mL-FEU — ABNORMAL HIGH (ref 0.00–0.50)

## 2019-08-11 MED ORDER — POTASSIUM CHLORIDE CRYS ER 20 MEQ PO TBCR
40.0000 meq | EXTENDED_RELEASE_TABLET | ORAL | Status: AC
  Administered 2019-08-12 (×2): 40 meq via ORAL
  Filled 2019-08-11 (×2): qty 2

## 2019-08-11 MED ORDER — SODIUM CHLORIDE 0.9 % IV SOLN
100.0000 mg | Freq: Two times a day (BID) | INTRAVENOUS | Status: DC
  Administered 2019-08-12: 100 mg via INTRAVENOUS
  Filled 2019-08-11 (×4): qty 100

## 2019-08-11 MED ORDER — ALBUTEROL SULFATE HFA 108 (90 BASE) MCG/ACT IN AERS
4.0000 | INHALATION_SPRAY | Freq: Once | RESPIRATORY_TRACT | Status: AC
  Administered 2019-08-11: 4 via RESPIRATORY_TRACT
  Filled 2019-08-11: qty 6.7

## 2019-08-11 MED ORDER — IPRATROPIUM-ALBUTEROL 0.5-2.5 (3) MG/3ML IN SOLN: 3.0000 mL | RESPIRATORY_TRACT | Status: DC | PRN

## 2019-08-11 MED ORDER — METHYLPREDNISOLONE SODIUM SUCC 125 MG IJ SOLR
125.0000 mg | Freq: Once | INTRAMUSCULAR | Status: AC
  Administered 2019-08-11: 125 mg via INTRAVENOUS
  Filled 2019-08-11: qty 2

## 2019-08-11 MED ORDER — MAGNESIUM SULFATE 2 GM/50ML IV SOLN
2.0000 g | Freq: Once | INTRAVENOUS | Status: AC
  Administered 2019-08-11: 2 g via INTRAVENOUS
  Filled 2019-08-11: qty 50

## 2019-08-11 MED ORDER — ALBUTEROL SULFATE HFA 108 (90 BASE) MCG/ACT IN AERS
2.0000 | INHALATION_SPRAY | Freq: Four times a day (QID) | RESPIRATORY_TRACT | Status: DC
  Administered 2019-08-11 – 2019-08-17 (×20): 2 via RESPIRATORY_TRACT
  Filled 2019-08-11 (×2): qty 6.7

## 2019-08-11 NOTE — ED Notes (Signed)
XR in room 

## 2019-08-11 NOTE — ED Triage Notes (Signed)
Pt arrived via ems for SOB. Pt was 64% on room air with EMS. Pt had home ned and neb with ems. Pt placed on 2l [place] 87%. .   pt recently dx with pna finished z-pack and recently started on another abx.  88% on 4l [place]

## 2019-08-11 NOTE — H&P (Signed)
History and Physical    Amy Chambers UJW:[id number] DOB: 02-15-1928 DOA: 07/26/2019  PCP: [name], [name], [name]   Patient coming from: Home  I have personally briefly reviewed patient's [facility]  Chief Complaint: Shortness of breath  HPI: Amy Chambers is a 85 y.o. female with medical history significant for coronary artery disease, COPD, paroxysmal atrial fibrillation, systolic and diastolic CHF.  Patient was brought to the ED via EMS for reports of difficulty breathing.  Per EMS O2 sats was 64% on room air, sats improved to 87 percent on 2 L O2..  My evaluation patient is awake and alert.  She denies chest pain and reports increasing difficulty breathing.  She recently completed a course of antibiotics Z-Pak and cephalexin for pneumonia.  Patient is not on home O2.  Patient's daughter was diagnosed with COVID-19.  Called patient's daughter- Amy Chambers, listed as patient's contact, but got voicemail.  ED Course: O2 sats initially 87% on 2 L improved to greater than 90% on 4 L.  Temperature 97.4.  Potassium 3.1.  BNP elevated 1838.  Point-of-care COVID-19 test positive.  Portable chest x-ray-without acute cardiopulmonary abnormality, stable hyperinflation number acute tactic changes in the right lower lobe.  Inflammatory markers pending.  125 mg Solu-Medrol given in ED hospitalist admit for acute respiratory failure.  Review of Systems: As per HPI all other systems reviewed and negative.  Past Medical History:  Diagnosis Date  . Cataract   . COPD (chronic obstructive pulmonary disease) (HCC)   . Dysphagia 05/31/2019  . GERD (gastroesophageal reflux disease)   . Hypertension   . Osteopenia   . Thyroid disease     Past Surgical History:  Procedure Laterality Date  . CATARACT EXTRACTION, BILATERAL    . CORONARY STENT INTERVENTION N/A 05/14/2019   Procedure: CORONARY STENT INTERVENTION;  Surgeon: Lennette BihariKelly, Thomas A, MD;  [facility]AS[facility] CV  LAB;  Service: Cardiovascular;  Laterality: N/A;  . LEFT HEART CATH AND CORONARY ANGIOGRAPHY N/A 05/14/2019   Procedure: LEFT HEART CATH AND CORONARY ANGIOGRAPHY;  Surgeon: Marykay LexHarding, David W, MD;  [facility];  Service: Cardiovascular;  Laterality: N/A;  . LEFT HEART CATH AND CORONARY ANGIOGRAPHY N/A 05/14/2019   Procedure: LEFT HEART CATH AND CORONARY ANGIOGRAPHY;  Surgeon: Lennette BihariKelly, Thomas A, MD;  Location: [facility] INVASIVE CV LAB;  Service: Cardiovascular;  Laterality: N/A;  . MEDIASTINAL EXPLORATION N/A 05/14/2019   Procedure: Mediastinal Exploration with Washout;  Surgeon: Corliss SkainsLightfoot, Harrell O, MD;  Location: [facility] OR;  Service: Open Heart Surgery;  Laterality: N/A;     reports that she quit smoking about 25 years ago. She quit smokeless tobacco use about 21 years ago.  Her smokeless tobacco use included chew. She reports that she does not drink alcohol or use drugs.  Allergies  Allergen Reactions  . Crestor [Rosuvastatin Calcium] Other (See Comments)    Cramping/pain in lower extremeties  . Niaspan [Niacin Er] Other (See Comments)    Cramping/pain in lower extremeties    Family History  Problem Relation Age of Onset  . Diabetes Mother   . Cancer Brother   . Tuberculosis Father   . Early death Sister   . Early death Brother   . COPD Brother   . Emphysema Brother   . Early death Sister   . COPD Sister   . Emphysema Sister   . Aneurysm Sister   . Stroke Sister   . Cancer Sister   . Hypertension Son   .  Hypertension Daughter   . Fibromyalgia Daughter   . Hypertension Daughter     Prior to Admission medications   Medication Sig Start Date End Date Taking? Authorizing Provider  acetaminophen (TYLENOL) 500 MG tablet Take 500 mg by mouth 3 (three) times daily.   Yes [provider]  albuterol (PROVENTIL) (2.5 MG/3ML) 0.083% nebulizer solution Take 3 mLs (2.5 mg total) by nebulization every 6 (six) hours as needed for wheezing or shortness of breath. 08/08/19  Yes  [name], [name], [name]  amiodarone (PACERONE) 100 MG tablet Take 1 tablet (100 mg total) by mouth every Tuesday, Thursday, Saturday, and Sunday. 06/13/19  Yes Sharee Holster, NP  amiodarone (PACERONE) 200 MG tablet Take 200 mg alternating with 100 mg daily. Patient taking differently: Take 200 mg by mouth See admin instructions. Take 200 mg alternating with 100 mg daily. 07/24/19  Yes Branch, Dorothe Pea, MD  azithromycin (ZITHROMAX Z-PAK) 250 MG tablet As directed Patient taking differently: Take 250-500 mg by mouth See admin instructions. As directed starting on 08/09/2019 08/01/19  Yes [name], [name], [name]  bisacodyl (DULCOLAX) 10 MG suppository Place 1 suppository (10 mg total) rectally daily as needed for moderate constipation. 05/31/19  Yes Leone Brand, NP  calcium-vitamin D 250-100 MG-UNIT per tablet Take 1 tablet by mouth 2 (two) times daily.    Yes [provider]  clopidogrel (PLAVIX) 75 MG tablet Take 1 tablet (75 mg total) by mouth daily. 08/05/19  Yes [name], [name], [name]  diclofenac sodium (VOLTAREN) 1 % GEL Apply 4 g topically 4 (four) times daily. 06/18/19  Yes [name], [name], [name]  diphenhydrAMINE-zinc acetate (BENADRYL) cream Apply topically 2 (two) times daily as needed for itching. 05/31/19  Yes Leone Brand, NP  doxycycline (VIBRA-TABS) 100 MG tablet Take 1 tablet (100 mg total) by mouth 2 (two) times daily for 10 days. 1 po bid Patient taking differently: Take 100 mg by mouth 2 (two) times daily. 10 day course starting on 08/09/2019 08/07/19 09/15/2019 Yes Rakes, Doralee Albino, [name]  furosemide (LASIX) 20 MG tablet Take 2 tablets (40 mg total) by mouth daily. 07/24/19  Yes Branch, Dorothe Pea, MD  guaiFENesin (MUCINEX) 600 MG 12 hr tablet Take 1 tablet (600 mg total) by mouth 2 (two) times daily. 05/31/19  Yes Leone Brand, NP  levothyroxine (SYNTHROID) 100 MCG tablet Take 1 tablet (100 mcg total) by mouth daily. 07/15/19 07/14/20 Yes [name],  [name], [name]  lidocaine (LIDODERM) 5 % Place 1 patch onto the skin daily. Remove & Discard patch within 12 hours or as directed by MD Patient taking differently: Place 1 patch onto the skin daily. Remove & Discard patch within 12 hours or as directed by MD-applied to neck at bedtime 07/16/19  Yes [name], [name], [name]  LORazepam (ATIVAN) 0.5 MG tablet Take 2 tablets (1 mg total) by mouth every 8 (eight) hours as needed (muscle spasm). 07/12/19  Yes Mancel Bale, MD  mometasone-formoterol [facility]) 200-5 MCG/ACT AERO Inhale 2 puffs into the lungs 2 (two) times daily. 07/20/19  Yes [name], [name], [name]  Multiple Vitamin (MULTIVITAMIN WITH MINERALS) TABS tablet Take 1 tablet by mouth daily. 06/01/19  Yes Leone Brand, NP  pantoprazole (PROTONIX) 40 MG tablet Take 1 tablet (40 mg total) by mouth 2 (two) times daily. 08/05/19  Yes [name], [name], [name]  polyethylene glycol (MIRALAX / GLYCOLAX) packet Take 17 g by mouth daily.   Yes [provider]  pravastatin (PRAVACHOL) 20 MG tablet Take 1 tablet (20 mg total)  by mouth every evening. 07/24/19 10/22/19 Yes Branch, Dorothe Pea, MD  predniSONE (STERAPRED UNI-PAK 21 TAB) 10 MG (21) TBPK tablet As directed x 6 days Patient taking differently: Take 10-60 mg by mouth as directed. As directed x 6 days (6,5,4,3,2,1) starting on 08/09/2019 08/07/19  Yes Rakes, Doralee Albino, [name]  Vitamin D, Cholecalciferol, 25 MCG (1000 UT) TABS Take 1 tablet by mouth daily.   Yes [provider]  baclofen (LIORESAL) 10 MG tablet Take 1 tablet (10 mg total) by mouth 3 (three) times daily. Patient not taking: Reported on August 14, 2019 06/28/19   Bennie Pierini, [name]  cephALEXin (KEFLEX) 500 MG capsule Take 1 capsule (500 mg total) by mouth 3 (three) times daily. Patient not taking: Reported on August 14, 2019 07/31/19   Bennie Pierini, [name]  Fe Fum-FA-B Cmp-C-Zn-Mg-Mn-Cu (HEMOCYTE PLUS) 106-1 MG CAPS Take 1 tablet by mouth daily. Patient not  taking: Reported on 08/14/2019 07/17/19   Bennie Pierini, [name]  loratadine (CLARITIN) 10 MG tablet Take 1 tablet (10 mg total) by mouth daily. Patient not taking: Reported on August 14, 2019 06/01/19   Leone Brand, NP  traMADol-acetaminophen (ULTRACET) 37.5-325 MG tablet Take 1 tablet by mouth every 6 (six) hours as needed for moderate pain. Patient not taking: Reported on 08/14/2019 07/12/19   Mancel Bale, MD    Physical Exam: Vitals:   2019/08/14 1943 08/14/19 2000 August 14, 2019 2008 Aug 14, 2019 2009  BP: (!) 155/89 117/81    Pulse: 100  95 98  Resp: (!) 22     Temp: (!) 97.4 F (36.3 C)     TempSrc: Oral     SpO2:   92% 90%  Weight: 52.6 kg       Constitutional: NAD, calm, comfortable Vitals:   2019-08-14 1943 14-Aug-2019 2000 08-14-19 2008 08-14-2019 2009  BP: (!) 155/89 117/81    Pulse: 100  95 98  Resp: (!) 22     Temp: (!) 97.4 F (36.3 C)     TempSrc: Oral     SpO2:   92% 90%  Weight: 52.6 kg      Eyes: PERRL, lids and conjunctivae normal ENMT: Mucous membranes are moist. Posterior pharynx clear of any exudate or lesions. Neck: normal, supple, no masses, no thyromegaly Respiratory: Diffuse coarse inspiratory and expiratory wheezing.  Mild to moderate accessory muscle use.   Cardiovascular: Regular rate and rhythm, no murmurs / rubs / gallops. Trace extremity edema. 2+ pedal pulses.  Abdomen: no tenderness, no masses palpated. No hepatosplenomegaly. Bowel sounds positive.  Musculoskeletal: no clubbing / cyanosis. No joint deformity upper and lower extremities. Good ROM, no contractures. Normal muscle tone.  Skin: no rashes, lesions, ulcers. No induration Neurologic: Moving all extremities spontaneously.  No obvious cranial nerve deficit. Psychiatric: Normal judgment and insight. Alert and oriented x 3. Normal mood.   Labs on Admission: I have personally reviewed following labs and imaging studies  CBC: Recent Labs  Lab 08/14/19 2029  WBC 13.3*  NEUTROABS 11.9*  HGB  11.2*  HCT 37.2  MCV 103.6*  PLT 551*   Basic Metabolic Panel: Recent Labs  Lab 08/14/2019 2029  NA 138  K 3.1*  CL 91*  CO2 33*  GLUCOSE 165*  BUN 21  CREATININE 1.03*  CALCIUM 9.5   Liver Function Tests: Recent Labs  Lab August 14, 2019 2029  AST 73*  ALT 31  ALKPHOS 84  BILITOT 1.2  PROT 6.6  ALBUMIN 3.2*    Radiological Exams on Admission: DG Chest Portable 1 View  Result Date: 2019-08-14 CLINICAL  DATA:  Shortness of breath EXAM: PORTABLE CHEST 1 VIEW COMPARISON:  Radiograph 06/14/2019, CT 05/14/2019 FINDINGS: Lungs appear hyperexpanded, similar to comparison. Stable bronchiectatic changes again seen in the right lower lobe. No consolidation, features of edema, pneumothorax, or effusion. The aorta is calcified. The remaining cardiomediastinal contours are unremarkable. Postsurgical changes related to prior CABG including intact and aligned sternotomy wires and multiple surgical clips projecting over the mediastinum. No acute osseous or soft tissue abnormality. Degenerative changes are present in the imaged spine and shoulders. IMPRESSION: 1. No acute cardiopulmonary abnormality 2. Stable hyperinflation and bronchiectatic changes in the right lower lobe. 3. Stable postoperative changes related to prior CABG. 4. Aortic atherosclerosis. Electronically Signed   By: Kreg Shropshire M.D.   On: Sep 07, 2019 20:24    EKG: Independently reviewed.  Sinus rhythm, prolonged QTC 586.  T wave abnormalities diffusely are old.  EKG without significant change from prior.  Assessment/Plan Active Problems:   Acute respiratory failure (HCC)   Acute respiratory failure-Per EMS O2 sats 64% on room air.  Currently on 4 L O2 with sats greater than 89- 94%.  Likely secondary to COPD exacerbation.  COVID-19 positive.  But chest x-ray-no acute abnormality, stable bronchiectatic changes. -Supplemental O2, mucolytics as needed -Albuterol inhaler as needed and scheduled  COPD exacerbation-wheezing present,  dyspnea, with hypoxia.  Likely exacerbated by COVID-19 viral infection.  Chest x-ray without pneumonia.  WBC 13.3.  Not on home O2. -25 mg Solu-Medrol given in ED, continue 40 mg every 12 hourly -Albuterol inhaler as needed scheduled, -Mucolytic's. -Respiratory protocol -IV doxycycline (QTc prolonged)  COVID-19 infection -chest x-ray without pneumonia.  Recently completed antibiotic course with Z-Pak and cephalexin. -Inflammatory markers pending -Check daily inflammatory markers -CBC, CMP daily -Vitamin C, zinc -IV steroids Solu-Medrol -As chest x-ray is without pneumonia, I did not start remdesivir -Admit to GVC, high risk for further decompensation, as she is hypoxic currently on 4 L O2.  Prolonged QTC-prolonged at 586.  She is on amiodarone.  Also hypokalemia likely contributing.  Recently completed Z-Pak course. -Check magnesium -EKG a.m. -Avoid further QT prolonging medications  Hypokalemia-potassium 3.1 -Replete K - Check magnesium  Systolic and diastolic CHF- BNP elevated at 4098.  Chest x-ray without signs of volume overload. Weight trend per chart stable.  Trace bilateral extremity edema.  Last echo/08/2018 EF 55 to 60%. -Blood pressure at this time soft with systolic down to 11/91, will hold off on resuming home Lasix at this time  Paroxysmal atrial fibrillation-rate controlled and anticoagulated.  EKG shows sinus rhythm.  Developed during hospitalization 04/2019. On antiplatelet therapy Plavix.  Follows with cardiology.  Off Eliquis due to anemia. -Resume home amiodarone -Resume home Plavix  Hypertension-blood pressure now soft systolic 95/47. -Hold home Lasix for now  Hypothyroidism-  -Resume home Synthroid  CAD history -prolonged hospitalization September to October 2020 with NSTEMI., 80% prox RCA. DES x 2 to RCA. Stay was complicated by hemopericardium, requiring pericardial evacuation.  Today she denies chest pain, EKG without significant changes. -Resume  statins -Continue Plavix   DVT prophylaxis: Lovenox Code Status: Assume full code.  Unable to talk to patient's daughter.  Got voicemail. Family Communication: Called patients daughter Amy Bilis, listed as patient [phone].  Got voicemail. Disposition Plan: > 2 days Consults called: None Admission status: Inpatient, tele  I certify that at the point of admission it is my clinical judgment that the patient will require inpatient hospital care spanning beyond 2 midnights from the point of admission  due to high intensity of service, high risk for further deterioration and high frequency of surveillance required.    Bethena Roys MD [facility]  Aug 18, 2019, 10:36 PM

## 2019-08-11 NOTE — ED Provider Notes (Signed)
[facility] EMERGENCY DEPARTMENT Provider Note   CSN: [id number] Arrival date & time: 08/21/19  1911     History Chief Complaint  Patient presents with  . Shortness of Breath    COVID+    Amy Chambers is a 85 y.o. female.  Patient complains of shortness of breath.  Patient has COPD and she has been exposed to Covid by her daughter.  The history is provided by the patient. No language interpreter was used.  Shortness of Breath Severity:  Moderate Onset quality:  Sudden Timing:  Constant Progression:  Worsening Chronicity:  Recurrent Context: activity   Relieved by:  Nothing Worsened by:  Nothing Associated symptoms: no abdominal pain, no chest pain, no cough, no headaches and no rash        Past Medical History:  Diagnosis Date  . Cataract   . COPD (chronic obstructive pulmonary disease) (HCC)   . Dysphagia 05/31/2019  . GERD (gastroesophageal reflux disease)   . Hypertension   . Osteopenia   . Thyroid disease     Patient Active Problem List   Diagnosis Date Noted  . Acute tension-type headache 06/14/2019  . Chronic constipation 06/09/2019  . Protein-calorie malnutrition, severe (HCC) 06/09/2019  . Acute blood loss anemia, transfusesd 3 units PRBC 05/31/2019  . 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 Anticoagulation adequate on Eliquis for PAF  05/31/2019  . Physical deconditioning due to acute illness 05/31/2019  . Dysphagia 05/31/2019  . DNR (do not resuscitate) 05/31/2019  . Sick sinus syndrome (HCC)   . Acute respiratory failure with hypoxia (HCC)   . COPD with acute exacerbation (HCC)   . Status post coronary artery stent placement   . PAF (paroxysmal atrial fibrillation) (HCC)   . 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 Pericardial tamponade   . CAD S/P percutaneous coronary angioplasty   . Acute respiratory failure with hypoxia and hypercapnia (HCC) 05/10/2019  . NSTEMI (non-ST elevated  myocardial infarction) (HCC) 05/10/2019  . Simple chronic bronchitis (HCC) 05/24/2018  . HTN (hypertension) 12/01/2010  . Hyperlipemia 12/01/2010  . Hypothyroid 12/01/2010  . Osteopenia 12/01/2010  . GERD (gastroesophageal reflux disease) 12/01/2010    Past Surgical History:  Procedure Laterality Date  . CATARACT EXTRACTION, BILATERAL    . CORONARY STENT INTERVENTION N/A 05/14/2019   Procedure: CORONARY STENT INTERVENTION;  Surgeon: Lennette Bihari, MD;  Location: [facility] INVASIVE CV LAB;  Service: Cardiovascular;  Laterality: N/A;  . LEFT HEART CATH AND CORONARY ANGIOGRAPHY N/A 05/14/2019   Procedure: LEFT HEART CATH AND CORONARY ANGIOGRAPHY;  Surgeon: Marykay Lex, MD;  Location: [facility] INVASIVE CV LAB;  Service: Cardiovascular;  Laterality: N/A;  . LEFT HEART CATH AND CORONARY ANGIOGRAPHY N/A 05/14/2019   Procedure: LEFT HEART CATH AND CORONARY ANGIOGRAPHY;  Surgeon: Lennette Bihari, MD;  Location: [facility] INVASIVE CV LAB;  Service: Cardiovascular;  Laterality: N/A;  . MEDIASTINAL EXPLORATION N/A 05/14/2019   Procedure: Mediastinal Exploration with Washout;  Surgeon: Corliss Skains, MD;  Location: [facility] OR;  Service: Open Heart Surgery;  Laterality: N/A;     OB History   No obstetric history on file.     Family History  Problem Relation Age of Onset  . Diabetes Mother   . Cancer Brother   . Tuberculosis Father   . Early death Sister   . Early death Brother   . COPD Brother   . Emphysema Brother   . Early death Sister   .  COPD Sister   . Emphysema Sister   . Aneurysm Sister   . Stroke Sister   . Cancer Sister   . Hypertension Son   . Hypertension Daughter   . Fibromyalgia Daughter   . Hypertension Daughter     Social History   Tobacco Use  . Smoking status: Former Smoker    Quit date: 08/15/1994    Years since quitting: 25.0  . Smokeless tobacco: Former Systems developer    Types: Chew    Quit date: 08/15/1998  Substance Use Topics  . Alcohol use: No  . Drug use: No    Home  Medications Prior to Admission medications   Medication Sig Start Date End Date Taking? Authorizing Provider  acetaminophen (TYLENOL) 500 MG tablet Take 500 mg by mouth 3 (three) times daily.   Yes [provider]  albuterol (PROVENTIL) (2.5 MG/3ML) 0.083% nebulizer solution Take 3 mLs (2.5 mg total) by nebulization every 6 (six) hours as needed for wheezing or shortness of breath. 08/08/19  Yes Hassell Done, [name], [name]  amiodarone (PACERONE) 100 MG tablet Take 1 tablet (100 mg total) by mouth every Tuesday, Thursday, Saturday, and Sunday. 06/13/19  Yes Gerlene Fee, NP  amiodarone (PACERONE) 200 MG tablet Take 200 mg alternating with 100 mg daily. Patient taking differently: Take 200 mg by mouth See admin instructions. Take 200 mg alternating with 100 mg daily. 07/24/19  Yes Branch, Alphonse Guild, MD  azithromycin (ZITHROMAX Z-PAK) 250 MG tablet As directed Patient taking differently: Take 250-500 mg by mouth See admin instructions. As directed starting on 08/09/2019 08/01/19  Yes Hassell Done, [name], [name]  bisacodyl (DULCOLAX) 10 MG suppository Place 1 suppository (10 mg total) rectally daily as needed for moderate constipation. 05/31/19  Yes Isaiah Serge, NP  calcium-vitamin D 250-100 MG-UNIT per tablet Take 1 tablet by mouth 2 (two) times daily.    Yes [provider]  clopidogrel (PLAVIX) 75 MG tablet Take 1 tablet (75 mg total) by mouth daily. 08/05/19  Yes [name], [name], [name]  diclofenac sodium (VOLTAREN) 1 % GEL Apply 4 g topically 4 (four) times daily. 06/18/19  Yes [name], [name], [name]  diphenhydrAMINE-zinc acetate (BENADRYL) cream Apply topically 2 (two) times daily as needed for itching. 05/31/19  Yes Isaiah Serge, NP  doxycycline (VIBRA-TABS) 100 MG tablet Take 1 tablet (100 mg total) by mouth 2 (two) times daily for 10 days. 1 po bid Patient taking differently: Take 100 mg by mouth 2 (two) times daily. 10 day course starting on 08/09/2019 08/07/19  2019-09-16 Yes Rakes, Connye Burkitt, [name]  furosemide (LASIX) 20 MG tablet Take 2 tablets (40 mg total) by mouth daily. 07/24/19  Yes Branch, Alphonse Guild, MD  guaiFENesin (MUCINEX) 600 MG 12 hr tablet Take 1 tablet (600 mg total) by mouth 2 (two) times daily. 05/31/19  Yes Isaiah Serge, NP  levothyroxine (SYNTHROID) 100 MCG tablet Take 1 tablet (100 mcg total) by mouth daily. 07/15/19 07/14/20 Yes [name], [name], [name]  lidocaine (LIDODERM) 5 % Place 1 patch onto the skin daily. Remove & Discard patch within 12 hours or as directed by MD Patient taking differently: Place 1 patch onto the skin daily. Remove & Discard patch within 12 hours or as directed by MD-applied to neck at bedtime 07/16/19  Yes Hassell Done, [name], [name]  LORazepam (ATIVAN) 0.5 MG tablet Take 2 tablets (1 mg total) by mouth every 8 (eight) hours as needed (muscle spasm). 07/12/19  Yes Daleen Bo, MD  mometasone-formoterol [facility]) 200-5 MCG/ACT AERO Inhale  2 puffs into the lungs 2 (two) times daily. 07/20/19  Yes [name], [name], [name]  Multiple Vitamin (MULTIVITAMIN WITH MINERALS) TABS tablet Take 1 tablet by mouth daily. 06/01/19  Yes Leone Brand, NP  pantoprazole (PROTONIX) 40 MG tablet Take 1 tablet (40 mg total) by mouth 2 (two) times daily. 08/05/19  Yes [name], [name], [name]  polyethylene glycol (MIRALAX / GLYCOLAX) packet Take 17 g by mouth daily.   Yes [provider]  pravastatin (PRAVACHOL) 20 MG tablet Take 1 tablet (20 mg total) by mouth every evening. 07/24/19 10/22/19 Yes Branch, Dorothe Pea, MD  predniSONE (STERAPRED UNI-PAK 21 TAB) 10 MG (21) TBPK tablet As directed x 6 days Patient taking differently: Take 10-60 mg by mouth as directed. As directed x 6 days (6,5,4,3,2,1) starting on 08/09/2019 08/07/19  Yes Rakes, Doralee Albino, [name]  Vitamin D, Cholecalciferol, 25 MCG (1000 UT) TABS Take 1 tablet by mouth daily.   Yes [provider]  baclofen (LIORESAL) 10 MG tablet Take 1 tablet (10 mg total)  by mouth 3 (three) times daily. Patient not taking: Reported on 07/23/2019 06/28/19   Bennie Pierini, [name]  cephALEXin (KEFLEX) 500 MG capsule Take 1 capsule (500 mg total) by mouth 3 (three) times daily. Patient not taking: Reported on 07/16/2019 07/31/19   Bennie Pierini, [name]  Fe Fum-FA-B Cmp-C-Zn-Mg-Mn-Cu (HEMOCYTE PLUS) 106-1 MG CAPS Take 1 tablet by mouth daily. Patient not taking: Reported on 08/02/2019 07/17/19   Bennie Pierini, [name]  loratadine (CLARITIN) 10 MG tablet Take 1 tablet (10 mg total) by mouth daily. Patient not taking: Reported on 07/28/2019 06/01/19   Leone Brand, NP  traMADol-acetaminophen (ULTRACET) 37.5-325 MG tablet Take 1 tablet by mouth every 6 (six) hours as needed for moderate pain. Patient not taking: Reported on 07/25/2019 07/12/19   Mancel Bale, MD    Allergies    Crestor [rosuvastatin calcium] and Niaspan [niacin er]  Review of Systems   Review of Systems  Constitutional: Negative for appetite change and fatigue.  HENT: Negative for congestion, ear discharge and sinus pressure.   Eyes: Negative for discharge.  Respiratory: Positive for shortness of breath. Negative for cough.   Cardiovascular: Negative for chest pain.  Gastrointestinal: Negative for abdominal pain and diarrhea.  Genitourinary: Negative for frequency and hematuria.  Musculoskeletal: Negative for back pain.  Skin: Negative for rash.  Neurological: Negative for seizures and headaches.  Psychiatric/Behavioral: Negative for hallucinations.    Physical Exam Updated Vital Signs BP 117/81   Pulse 98   Temp (!) 97.4 F (36.3 C) (Oral)   Resp (!) 22   Wt 52.6 kg   SpO2 90%   BMI 23.42 kg/m   Physical Exam Vitals reviewed.  Constitutional:      Appearance: She is well-developed.  HENT:     Head: Normocephalic.     Nose: Nose normal.  Eyes:     General: No scleral icterus.    Conjunctiva/sclera: Conjunctivae normal.  Neck:     Thyroid: No thyromegaly.    Cardiovascular:     Rate and Rhythm: Normal rate and regular rhythm.     Heart sounds: No murmur. No friction rub. No gallop.   Pulmonary:     Breath sounds: No stridor. Wheezing present. No rales.  Chest:     Chest wall: No tenderness.  Abdominal:     General: There is no distension.     Tenderness: There is no abdominal tenderness. There is no rebound.  Musculoskeletal:  General: Normal range of motion.     Cervical back: Neck supple.  Lymphadenopathy:     Cervical: No cervical adenopathy.  Skin:    Findings: No erythema or rash.  Neurological:     Mental Status: She is alert and oriented to person, place, and time.     Motor: No abnormal muscle tone.     Coordination: Coordination normal.  Psychiatric:        Behavior: Behavior normal.     ED Results / Procedures / Treatments   Labs (all labs ordered are listed, but only abnormal results are displayed) Labs Reviewed  CBC WITH DIFFERENTIAL/PLATELET - Abnormal; Notable for the following components:      Result Value   WBC 13.3 (*)    RBC 3.59 (*)    Hemoglobin 11.2 (*)    MCV 103.6 (*)    RDW 17.2 (*)    Platelets 551 (*)    Neutro Abs 11.9 (*)    Lymphs Abs 0.3 (*)    Abs Immature Granulocytes 0.20 (*)    All other components within normal limits  COMPREHENSIVE METABOLIC PANEL - Abnormal; Notable for the following components:   Potassium 3.1 (*)    Chloride 91 (*)    CO2 33 (*)    Glucose, Bld 165 (*)    Creatinine, Ser 1.03 (*)    Albumin 3.2 (*)    AST 73 (*)    GFR calc non Af Amer 48 (*)    GFR calc Af Amer 56 (*)    All other components within normal limits  BRAIN NATRIURETIC PEPTIDE - Abnormal; Notable for the following components:   B Natriuretic Peptide 1,838.0 (*)    All other components within normal limits  POC SARS CORONAVIRUS 2 AG -  ED - Abnormal; Notable for the following components:   SARS Coronavirus 2 Ag POSITIVE (*)    All other components within normal limits  LACTIC ACID,  PLASMA  PROCALCITONIN  D-DIMER, QUANTITATIVE (NOT AT [facility])  LACTATE DEHYDROGENASE  FERRITIN  TRIGLYCERIDES  FIBRINOGEN  C-REACTIVE PROTEIN    EKG None  Radiology DG Chest Portable 1 View  Result Date: 2018/10/23 CLINICAL DATA:  Shortness of breath EXAM: PORTABLE CHEST 1 VIEW COMPARISON:  Radiograph 06/14/2019, CT 05/14/2019 FINDINGS: Lungs appear hyperexpanded, similar to comparison. Stable bronchiectatic changes again seen in the right lower lobe. No consolidation, features of edema, pneumothorax, or effusion. The aorta is calcified. The remaining cardiomediastinal contours are unremarkable. Postsurgical changes related to prior CABG including intact and aligned sternotomy wires and multiple surgical clips projecting over the mediastinum. No acute osseous or soft tissue abnormality. Degenerative changes are present in the imaged spine and shoulders. IMPRESSION: 1. No acute cardiopulmonary abnormality 2. Stable hyperinflation and bronchiectatic changes in the right lower lobe. 3. Stable postoperative changes related to prior CABG. 4. Aortic atherosclerosis. Electronically Signed   By: Kreg ShropshirePrice  DeHay M.D.   On: 2018/10/23 20:24    Procedures Procedures (including critical care time)  Medications Ordered in ED Medications  magnesium sulfate IVPB 2 g 50 mL (2 g Intravenous New Bag/Given 2019/01/07 2100)  methylPREDNISolone sodium succinate (SOLU-MEDROL) 125 mg/2 mL injection 125 mg (125 mg Intravenous Given 2019/01/07 2059)  albuterol (VENTOLIN HFA) 108 (90 Base) MCG/ACT inhaler 4 puff (4 puffs Inhalation Given 2019/01/07 2041)    ED Course  I have reviewed the triage vital signs and the nursing notes.  Pertinent labs & imaging results that were available during my care of the patient were  reviewed by me and considered in my medical decision making (see chart for details).    MDM Rules/Calculators/A&P                      Patient with hypoxia and COPD exacerbation along with Covid  infection.  She will be admitted to medicine Final Clinical Impression(s) / ED Diagnoses Final diagnoses:  COPD exacerbation (HCC)  COVID-19    Rx / DC Orders ED Discharge Orders    None       Bethann Berkshire, MD 08/12/2019 2134

## 2019-08-11 NOTE — ED Notes (Signed)
Date and time results received: 08/06/2019 2326  Test: Lactic Acid  Critical Value: 3.2  Name of Provider Notified: Nanavati  Orders Received? Or Actions Taken?: na

## 2019-08-12 ENCOUNTER — Inpatient Hospital Stay (HOSPITAL_COMMUNITY): Payer: Medicare Other

## 2019-08-12 DIAGNOSIS — U071 COVID-19: Principal | ICD-10-CM

## 2019-08-12 DIAGNOSIS — J441 Chronic obstructive pulmonary disease with (acute) exacerbation: Secondary | ICD-10-CM

## 2019-08-12 DIAGNOSIS — I480 Paroxysmal atrial fibrillation: Secondary | ICD-10-CM

## 2019-08-12 DIAGNOSIS — J1289 Other viral pneumonia: Secondary | ICD-10-CM

## 2019-08-12 LAB — COMPREHENSIVE METABOLIC PANEL
ALT: 28 U/L (ref 0–44)
AST: 33 U/L (ref 15–41)
Albumin: 2.8 g/dL — ABNORMAL LOW (ref 3.5–5.0)
Alkaline Phosphatase: 69 U/L (ref 38–126)
Anion gap: 13 (ref 5–15)
BUN: 23 mg/dL (ref 8–23)
CO2: 36 mmol/L — ABNORMAL HIGH (ref 22–32)
Calcium: 9.6 mg/dL (ref 8.9–10.3)
Chloride: 91 mmol/L — ABNORMAL LOW (ref 98–111)
Creatinine, Ser: 1.08 mg/dL — ABNORMAL HIGH (ref 0.44–1.00)
GFR calc Af Amer: 53 mL/min — ABNORMAL LOW (ref >60)
GFR calc non Af Amer: 45 mL/min — ABNORMAL LOW (ref >60)
Glucose, Bld: 121 mg/dL — ABNORMAL HIGH (ref 70–99)
Potassium: 3.3 mmol/L — ABNORMAL LOW (ref 3.5–5.1)
Sodium: 140 mmol/L (ref 135–145)
Total Bilirubin: 0.6 mg/dL (ref 0.3–1.2)
Total Protein: 5.4 g/dL — ABNORMAL LOW (ref 6.5–8.1)

## 2019-08-12 LAB — CBC WITH DIFFERENTIAL/PLATELET
Abs Immature Granulocytes: 0.12 10*3/uL — ABNORMAL HIGH (ref 0.00–0.07)
Basophils Absolute: 0 10*3/uL (ref 0.0–0.1)
Basophils Relative: 0 %
Eosinophils Absolute: 0 10*3/uL (ref 0.0–0.5)
Eosinophils Relative: 0 %
HCT: 33.2 % — ABNORMAL LOW (ref 36.0–46.0)
Hemoglobin: 10 g/dL — ABNORMAL LOW (ref 12.0–15.0)
Immature Granulocytes: 2 %
Lymphocytes Relative: 6 %
Lymphs Abs: 0.3 10*3/uL — ABNORMAL LOW (ref 0.7–4.0)
MCH: 31.3 pg (ref 26.0–34.0)
MCHC: 30.1 g/dL (ref 30.0–36.0)
MCV: 104.1 fL — ABNORMAL HIGH (ref 80.0–100.0)
Monocytes Absolute: 0.3 10*3/uL (ref 0.1–1.0)
Monocytes Relative: 4 %
Neutro Abs: 5.3 10*3/uL (ref 1.7–7.7)
Neutrophils Relative %: 88 %
Platelets: 340 10*3/uL (ref 150–400)
RBC: 3.19 MIL/uL — ABNORMAL LOW (ref 3.87–5.11)
RDW: 17.1 % — ABNORMAL HIGH (ref 11.5–15.5)
WBC: 6.1 10*3/uL (ref 4.0–10.5)
nRBC: 0 % (ref 0.0–0.2)

## 2019-08-12 LAB — D-DIMER, QUANTITATIVE: D-Dimer, Quant: 1.81 ug/mL-FEU — ABNORMAL HIGH (ref 0.00–0.50)

## 2019-08-12 LAB — ABO/RH: ABO/RH(D): A POS

## 2019-08-12 LAB — C-REACTIVE PROTEIN: CRP: 1.2 mg/dL — ABNORMAL HIGH (ref <1.0)

## 2019-08-12 LAB — FERRITIN: Ferritin: 310 ng/mL — ABNORMAL HIGH (ref 11–307)

## 2019-08-12 MED ORDER — GUAIFENESIN-DM 100-10 MG/5ML PO SYRP: 5.0000 mL | ORAL_SOLUTION | ORAL | Status: DC | PRN

## 2019-08-12 MED ORDER — AMIODARONE HCL 100 MG PO TABS
200.0000 mg | ORAL_TABLET | ORAL | Status: DC
  Administered 2019-08-12 – 2019-08-16 (×3): 200 mg via ORAL
  Filled 2019-08-12 (×4): qty 2

## 2019-08-12 MED ORDER — SODIUM CHLORIDE 0.9 % IV SOLN
100.0000 mg | Freq: Every day | INTRAVENOUS | Status: AC
  Administered 2019-08-13 – 2019-08-16 (×4): 100 mg via INTRAVENOUS
  Filled 2019-08-12 (×5): qty 20

## 2019-08-12 MED ORDER — FUROSEMIDE 20 MG PO TABS
40.0000 mg | ORAL_TABLET | Freq: Every day | ORAL | Status: DC
  Administered 2019-08-12 – 2019-08-13 (×2): 40 mg via ORAL
  Filled 2019-08-12 (×2): qty 2

## 2019-08-12 MED ORDER — ASCORBIC ACID 500 MG PO TABS
500.0000 mg | ORAL_TABLET | Freq: Every day | ORAL | Status: DC
  Administered 2019-08-12 – 2019-08-16 (×5): 500 mg via ORAL
  Filled 2019-08-12 (×6): qty 1

## 2019-08-12 MED ORDER — SODIUM CHLORIDE 0.9 % IV SOLN
200.0000 mg | Freq: Once | INTRAVENOUS | Status: AC
  Administered 2019-08-12: 13:00:00 200 mg via INTRAVENOUS
  Filled 2019-08-12: qty 20

## 2019-08-12 MED ORDER — POTASSIUM CHLORIDE CRYS ER 20 MEQ PO TBCR
40.0000 meq | EXTENDED_RELEASE_TABLET | ORAL | Status: AC
  Administered 2019-08-12 (×2): 40 meq via ORAL
  Filled 2019-08-12 (×2): qty 2

## 2019-08-12 MED ORDER — LEVOTHYROXINE SODIUM 50 MCG PO TABS
100.0000 ug | ORAL_TABLET | Freq: Every day | ORAL | Status: DC
  Administered 2019-08-13 – 2019-08-16 (×4): 100 ug via ORAL
  Filled 2019-08-12 (×5): qty 2

## 2019-08-12 MED ORDER — METHYLPREDNISOLONE SODIUM SUCC 40 MG IJ SOLR
40.0000 mg | Freq: Two times a day (BID) | INTRAMUSCULAR | Status: DC
  Administered 2019-08-12 – 2019-08-17 (×11): 40 mg via INTRAVENOUS
  Filled 2019-08-12 (×11): qty 1

## 2019-08-12 MED ORDER — CLOPIDOGREL BISULFATE 75 MG PO TABS
75.0000 mg | ORAL_TABLET | Freq: Every day | ORAL | Status: DC
  Administered 2019-08-12 – 2019-08-16 (×5): 75 mg via ORAL
  Filled 2019-08-12 (×6): qty 1

## 2019-08-12 MED ORDER — ZINC SULFATE 220 (50 ZN) MG PO CAPS
220.0000 mg | ORAL_CAPSULE | Freq: Every day | ORAL | Status: DC
  Administered 2019-08-12 – 2019-08-16 (×5): 220 mg via ORAL
  Filled 2019-08-12 (×6): qty 1

## 2019-08-12 MED ORDER — ONDANSETRON HCL 4 MG PO TABS: 4.0000 mg | ORAL_TABLET | Freq: Four times a day (QID) | ORAL | Status: DC | PRN

## 2019-08-12 MED ORDER — ENOXAPARIN SODIUM 40 MG/0.4ML ~~LOC~~ SOLN
40.0000 mg | SUBCUTANEOUS | Status: DC
  Administered 2019-08-12: 03:00:00 40 mg via SUBCUTANEOUS
  Filled 2019-08-12: qty 0.4

## 2019-08-12 MED ORDER — AMIODARONE HCL 100 MG PO TABS
100.0000 mg | ORAL_TABLET | ORAL | Status: DC
  Administered 2019-08-13 – 2019-08-15 (×2): 100 mg via ORAL
  Filled 2019-08-12 (×5): qty 1

## 2019-08-12 MED ORDER — HEPARIN SODIUM (PORCINE) 5000 UNIT/ML IJ SOLN
5000.0000 [IU] | Freq: Three times a day (TID) | INTRAMUSCULAR | Status: DC
  Administered 2019-08-13 – 2019-08-17 (×12): 5000 [IU] via SUBCUTANEOUS
  Filled 2019-08-12 (×13): qty 1

## 2019-08-12 MED ORDER — ONDANSETRON HCL 4 MG/2ML IJ SOLN: 4.0000 mg | Freq: Four times a day (QID) | INTRAMUSCULAR | Status: DC | PRN

## 2019-08-12 MED ORDER — POLYETHYLENE GLYCOL 3350 17 G PO PACK: 17.0000 g | PACK | Freq: Every day | ORAL | Status: DC | PRN

## 2019-08-12 MED ORDER — ACETAMINOPHEN 325 MG PO TABS: 650.0000 mg | ORAL_TABLET | Freq: Four times a day (QID) | ORAL | Status: DC | PRN

## 2019-08-12 MED ORDER — PANTOPRAZOLE SODIUM 40 MG PO TBEC
40.0000 mg | DELAYED_RELEASE_TABLET | Freq: Two times a day (BID) | ORAL | Status: DC
  Administered 2019-08-12 – 2019-08-16 (×10): 40 mg via ORAL
  Filled 2019-08-12 (×11): qty 1

## 2019-08-12 MED ORDER — PRAVASTATIN SODIUM 10 MG PO TABS
20.0000 mg | ORAL_TABLET | Freq: Every evening | ORAL | Status: DC
  Administered 2019-08-12 – 2019-08-16 (×5): 20 mg via ORAL
  Filled 2019-08-12 (×5): qty 2

## 2019-08-12 NOTE — Progress Notes (Signed)
PROGRESS NOTE                                                                                                                                                                                                             Patient Demographics:    Amy Chambers, is a 85 y.o. female, DOB - 1928-03-27, JXB:[id number]  Outpatient Primary MD for the patient is Bennie Pierini, [name]   Admit date - 2019-08-19   LOS - 1  Chief Complaint  Patient presents with  . Shortness of Breath    COVID+       Brief Narrative: Patient is a 85 y.o. female with PMHx of CAD-s/p PCI September 2020 complicated by hemopericardium, COPD, PAF, heart failure-who presented to the emergency room on 12/27 with shortness of breath-Per EMS report-patient was apparently satting 64% on room air.  She was evaluated in the emergency room-and thought to have COPD exacerbation and also found to have COVID-19.   Subjective:    Amy Chambers today was lying comfortably in bed-appears stable on 2-3 L of oxygen.  Very poor historian-but denies any chest pain or shortness of breath.   Assessment  & Plan :   Acute Hypoxic Resp Failure secondary to COPD exacerbation and possible small pneumonia secondary to COVID-19 not seen on chest x-ray: Still requiring around 3 L of oxygen this morning-some wheezing-but moving air well.  Continue steroids-given hypoxemia-and high likelihood that she does have a small pneumonia-we will go and start remdesivir.    Fever: afebrile  O2 requirements:  SpO2: 96 % O2 Flow Rate (L/min): 3 L/min   COVID-19 Labs: Recent Labs    19-Aug-2019 2243 08/12/19 0650  DDIMER 2.98* 1.81*  FERRITIN 597* 310*  LDH 336*  --   CRP 1.7* 1.2*       Component Value Date/Time   BNP 1,838.0 (H) August 19, 2019 2029    Recent Labs  Lab Aug 19, 2019 2243  PROCALCITON 0.41    Lab Results  Component Value Date   SARSCOV2NAA Not Detected 06/21/2019   SARSCOV2NAA NOT DETECTED 05/28/2019   SARSCOV2NAA NEGATIVE 05/10/2019     COVID-19 Medications: Steroids: 12/27>> Remdesivir: 12/28>>  Other medications: Diuretics:Euvolemic-Lasix 40 mg IV x1 to maintain negative balance. Antibiotics: No indication of bacterial infection-stop doxycycline on 12/28 Insulin: Monitor CBGs on SSI while on steroids.  Check A1c.  Prone/Incentive Spirometry: encouraged  incentive spirometry use 3-4/hour.  DVT Prophylaxis  :  Lovenox   Hypokalemia: Replete and recheck.  QTC prolongation: Replete potassium-keep magnesium more than 2.  Repeat twelve-lead EKG.  Chronic diastolic heart failure: Volume status appears stable-give 1 dose of Lasix to maintain negative balance.  CAD: Continue Plavix-no anginal symptoms.  PAF: Sinus rhythm-reviewed prior cardiology notes-not a anticoagulation secondary to anemia/GI bleeding-recent history of hemopericardium.  Dyslipidemia: Continue statin  Hypothyroidism: Continue Synthroid  Debility/deconditioning: Suspect has some amount of chronic debility/deconditioning at baseline-she appears very frail.  PT/OT ordered.  Goals of care: DNR in place.  Both family and patient do not want aggressive care with intubation/chest compressions etc.  Plan is to continue with gentle medical treatment-if she were to worsen significantly and not respond to medical treatment-plans would be then to transition to hospice care.   Consults  :  None  Procedures  :  None  ABG:    Component Value Date/Time   PHART 7.380 05/15/2019 0202   PCO2ART 37.9 05/15/2019 0202   PO2ART 240.0 (H) 05/15/2019 0202   HCO3 23.2 05/15/2019 0202   TCO2 22 05/15/2019 0220   ACIDBASEDEF 3.0 (H) 05/15/2019 0202   O2SAT 100.0 05/15/2019 0202    Vent Settings: N/A   Condition -Guarded  Family Communication  :  Daughter updated over the phone on 12/28  Code Status :  DNR  Diet :  Diet Order            Diet regular Room service appropriate?  Yes; Fluid consistency: Thin  Diet effective now               Disposition Plan  :  Remain hospitalized  Barriers to discharge: Hypoxia requiring O2 supplementation/complete 5 days of IV Remdesivir  Antimicorbials  :    Anti-infectives (From admission, onward)   Start     Dose/Rate Route Frequency Ordered Stop   07/17/2019 2230  doxycycline (VIBRAMYCIN) 100 mg in sodium chloride 0.9 % 250 mL IVPB     100 mg 125 mL/hr over 120 Minutes Intravenous Every 12 hours 08/02/2019 2216        Inpatient Medications  Scheduled Meds: . albuterol  2 puff Inhalation Q6H  . vitamin C  500 mg Oral Daily  . clopidogrel  75 mg Oral Daily  . enoxaparin (LOVENOX) injection  40 mg Subcutaneous Q24H  . furosemide  40 mg Oral Daily  . levothyroxine  100 mcg Oral Q0600  . methylPREDNISolone (SOLU-MEDROL) injection  40 mg Intravenous Q12H  . pantoprazole  40 mg Oral BID  . pravastatin  20 mg Oral QPM  . zinc sulfate  220 mg Oral Daily   Continuous Infusions: . doxycycline (VIBRAMYCIN) IV 100 mg (08/12/19 0026)   PRN Meds:.acetaminophen, guaiFENesin-dextromethorphan, ondansetron **OR** ondansetron (ZOFRAN) IV, polyethylene glycol   Time Spent in minutes  35   See all Orders from today for further details   Jeoffrey Massed M.D on 08/12/2019 at 10:54 AM  To page go to [email or web address] - use universal password  [facility] -  Office  [phone]    Objective:   Vitals:   08/12/19 0000 08/12/19 0224 08/12/19 0400 08/12/19 0944  BP: 104/64 96/64 (!) 99/54 (!) 105/52  Pulse: 80 75  75  Resp: (!) 21 20 17  (!) 22  Temp:  98.1 F (36.7 C) 98.9 F (37.2 C) 98.6 F (37 C)  TempSrc:  Oral Oral Oral  SpO2: 100% 98% 98% 96%  Weight:  51.6  kg    Height:  4\' 11"  (1.499 m)      Wt Readings from Last 3 Encounters:  08/12/19 51.6 kg  07/24/19 52.6 kg  07/12/19 53.1 kg     Intake/Output Summary (Last 24 hours) at 08/12/2019 1054 Last data filed at 08/12/2019 0944 Gross per 24 hour    Intake 260 ml  Output --  Net 260 ml     Physical Exam Gen Exam:Alert awake-not in any distress HEENT:atraumatic, normocephalic Chest: B/L clear to auscultation anteriorly-rhonchi+ CVS:S1S2 regular Abdomen:soft non tender, non distended Extremities:no edema Neurology: Non focal Skin: no rash   Data Review:    CBC Recent Labs  Lab 07/23/2019 2029 08/12/19 0650  WBC 13.3* 6.1  HGB 11.2* 10.0*  HCT 37.2 33.2*  PLT 551* 340  MCV 103.6* 104.1*  MCH 31.2 31.3  MCHC 30.1 30.1  RDW 17.2* 17.1*  LYMPHSABS 0.3* 0.3*  MONOABS 0.8 0.3  EOSABS 0.0 0.0  BASOSABS 0.0 0.0    Chemistries  Recent Labs  Lab 07/23/2019 2029 08/06/2019 2243 08/12/19 0650  NA 138  --  140  K 3.1*  --  3.3*  CL 91*  --  91*  CO2 33*  --  36*  GLUCOSE 165*  --  121*  BUN 21  --  23  CREATININE 1.03*  --  1.08*  CALCIUM 9.5  --  9.6  MG  --  2.8*  --   AST 73*  --  33  ALT 31  --  28  ALKPHOS 84  --  69  BILITOT 1.2  --  0.6   ------------------------------------------------------------------------------------------------------------------ Recent Labs    08/02/2019 2243  TRIG 179*    No results found for: HGBA1C ------------------------------------------------------------------------------------------------------------------ No results for input(s): TSH, T4TOTAL, T3FREE, THYROIDAB in the last 72 hours.  Invalid input(s): FREET3 ------------------------------------------------------------------------------------------------------------------ Recent Labs    08/10/2019 2243 08/12/19 0650  FERRITIN 597* 310*    Coagulation profile No results for input(s): INR, PROTIME in the last 168 hours.  Recent Labs    07/20/2019 2243 08/12/19 0650  DDIMER 2.98* 1.81*    Cardiac Enzymes No results for input(s): CKMB, TROPONINI, MYOGLOBIN in the last 168 hours.  Invalid input(s):  CK ------------------------------------------------------------------------------------------------------------------    Component Value Date/Time   BNP 1,838.0 (H) 07/31/2019 2029    Micro Results No results found for this or any previous visit (from the past 240 hour(s)).  Radiology Reports MR Cervical Spine Wo Contrast  Result Date: 07/20/2019 CLINICAL DATA:  Headache, neck pain. Unable to ambulate. Weakness. EXAM: MRI CERVICAL SPINE WITHOUT CONTRAST TECHNIQUE: Multiplanar, multisequence MR imaging of the cervical spine was performed. No intravenous contrast was administered. COMPARISON:  Neck CT 05/10/2019 FINDINGS: Alignment: Normal overall alignment. There is mild degenerative posterior subluxation of C4 and C5. Vertebrae: Normal marrow signal. No bone lesions or fractures. Cord: Normal cord signal intensity. No cord lesions or syrinx. Posterior Fossa, vertebral arteries, paraspinal tissues: No significant findings. Disc levels: C2-3: No significant findings. C3-4: Degenerated bulging and uncovered disc along with osteophytic ridging and uncinate spurring. There is flattening of the ventral thecal sac and narrowing the ventral CSF space. Mild left foraminal stenosis mainly due to asymmetric left-sided facet disease. C4-5: Bulging uncovered disc, osteophytic ridging and uncinate spurring. Flattening of the ventral thecal sac and narrowing the ventral CSF space. No significant foraminal stenosis. C5-6: Bulging slightly uncovered disc with osteophytic ridging and uncinate spurring. There is flattening of the ventral thecal sac and narrowing the ventral CSF space. Right-sided  disc osteophyte complex with moderate right foraminal stenosis. C6-7: Mild diffuse annular bulge with slight flattening of the ventral thecal sac. Mild left foraminal encroachment but no significant foraminal stenosis. C7-T1: No significant findings. IMPRESSION: 1. Degenerative cervical spondylosis with multilevel disc disease  and facet disease. 2. Bulging degenerated discs, osteophytic ridging and uncinate spurring mainly at C3-4, C4-5 and C5-6. No significant spinal stenosis. 3. Mild left foraminal stenosis at C3-4. 4. Moderate right foraminal stenosis at C5-6. Electronically Signed   By: Marijo Sanes M.D.   On: 07/20/2019 15:09   DG Chest Portable 1 View  Result Date: 28-Aug-2019 CLINICAL DATA:  Shortness of breath EXAM: PORTABLE CHEST 1 VIEW COMPARISON:  Radiograph 06/14/2019, CT 05/14/2019 FINDINGS: Lungs appear hyperexpanded, similar to comparison. Stable bronchiectatic changes again seen in the right lower lobe. No consolidation, features of edema, pneumothorax, or effusion. The aorta is calcified. The remaining cardiomediastinal contours are unremarkable. Postsurgical changes related to prior CABG including intact and aligned sternotomy wires and multiple surgical clips projecting over the mediastinum. No acute osseous or soft tissue abnormality. Degenerative changes are present in the imaged spine and shoulders. IMPRESSION: 1. No acute cardiopulmonary abnormality 2. Stable hyperinflation and bronchiectatic changes in the right lower lobe. 3. Stable postoperative changes related to prior CABG. 4. Aortic atherosclerosis. Electronically Signed   By: Lovena Le M.D.   On: 28-Aug-2019 20:24   DG Chest Port 1V today  Result Date: 08/12/2019 CLINICAL DATA:  Shortness of breath EXAM: PORTABLE CHEST 1 VIEW COMPARISON:  August 28, 2019 FINDINGS: Background changes of COPD. Lung aeration is similar with slightly increased retrocardiac density probably reflecting atelectasis. No significant pleural effusion. No pneumothorax. Stable cardiomediastinal contours. IMPRESSION: Slightly increased retrocardiac density probably reflecting atelectasis. Otherwise unchanged. Electronically Signed   By: Macy Mis M.D.   On: 08/12/2019 08:20

## 2019-08-12 NOTE — ED Notes (Signed)
Called and updated Amy Chambers pt sister pt sister gave verbal permission

## 2019-08-12 NOTE — Plan of Care (Signed)
  Problem: Education: Goal: Knowledge of risk factors and measures for prevention of condition will improve Outcome: Progressing   Problem: Coping: Goal: Psychosocial and spiritual needs will be supported Outcome: Progressing   Problem: Respiratory: Goal: Will maintain a patent airway Outcome: Progressing   

## 2019-08-12 NOTE — Progress Notes (Signed)
Patient admitted to room 106, required documentation completed.  Patient shown as FULL code status but in talking with her she says she is DNR.  Code status HX shows a DNR on 05/28/2019.  A message was sent to the Night hospitalist concerning this matter.

## 2019-08-12 NOTE — Progress Notes (Signed)
1848- Updates given to family. Time allowed for questions or concerns.

## 2019-08-13 LAB — CBC WITH DIFFERENTIAL/PLATELET
Abs Immature Granulocytes: 0.25 10*3/uL — ABNORMAL HIGH (ref 0.00–0.07)
Basophils Absolute: 0 10*3/uL (ref 0.0–0.1)
Basophils Relative: 0 %
Eosinophils Absolute: 0 10*3/uL (ref 0.0–0.5)
Eosinophils Relative: 0 %
HCT: 36.2 % (ref 36.0–46.0)
Hemoglobin: 10.7 g/dL — ABNORMAL LOW (ref 12.0–15.0)
Immature Granulocytes: 1 %
Lymphocytes Relative: 2 %
Lymphs Abs: 0.3 10*3/uL — ABNORMAL LOW (ref 0.7–4.0)
MCH: 31.5 pg (ref 26.0–34.0)
MCHC: 29.6 g/dL — ABNORMAL LOW (ref 30.0–36.0)
MCV: 106.5 fL — ABNORMAL HIGH (ref 80.0–100.0)
Monocytes Absolute: 0.8 10*3/uL (ref 0.1–1.0)
Monocytes Relative: 4 %
Neutro Abs: 19.8 10*3/uL — ABNORMAL HIGH (ref 1.7–7.7)
Neutrophils Relative %: 93 %
Platelets: 488 10*3/uL — ABNORMAL HIGH (ref 150–400)
RBC: 3.4 MIL/uL — ABNORMAL LOW (ref 3.87–5.11)
RDW: 17.5 % — ABNORMAL HIGH (ref 11.5–15.5)
WBC: 21.3 10*3/uL — ABNORMAL HIGH (ref 4.0–10.5)
nRBC: 0 % (ref 0.0–0.2)

## 2019-08-13 LAB — COMPREHENSIVE METABOLIC PANEL
ALT: 27 U/L (ref 0–44)
AST: 27 U/L (ref 15–41)
Albumin: 3.1 g/dL — ABNORMAL LOW (ref 3.5–5.0)
Alkaline Phosphatase: 69 U/L (ref 38–126)
Anion gap: 10 (ref 5–15)
BUN: 30 mg/dL — ABNORMAL HIGH (ref 8–23)
CO2: 33 mmol/L — ABNORMAL HIGH (ref 22–32)
Calcium: 8.9 mg/dL (ref 8.9–10.3)
Chloride: 98 mmol/L (ref 98–111)
Creatinine, Ser: 1.11 mg/dL — ABNORMAL HIGH (ref 0.44–1.00)
GFR calc Af Amer: 51 mL/min — ABNORMAL LOW (ref >60)
GFR calc non Af Amer: 44 mL/min — ABNORMAL LOW (ref >60)
Glucose, Bld: 125 mg/dL — ABNORMAL HIGH (ref 70–99)
Potassium: 5.1 mmol/L (ref 3.5–5.1)
Sodium: 141 mmol/L (ref 135–145)
Total Bilirubin: 0.7 mg/dL (ref 0.3–1.2)
Total Protein: 5.6 g/dL — ABNORMAL LOW (ref 6.5–8.1)

## 2019-08-13 LAB — FERRITIN: Ferritin: 257 ng/mL (ref 11–307)

## 2019-08-13 LAB — GLUCOSE, CAPILLARY
Glucose-Capillary: 162 mg/dL — ABNORMAL HIGH (ref 70–99)
Glucose-Capillary: 143 mg/dL — ABNORMAL HIGH (ref 70–99)
Glucose-Capillary: 125 mg/dL — ABNORMAL HIGH (ref 70–99)

## 2019-08-13 LAB — C-REACTIVE PROTEIN: CRP: 1.6 mg/dL — ABNORMAL HIGH (ref <1.0)

## 2019-08-13 LAB — D-DIMER, QUANTITATIVE: D-Dimer, Quant: 2.37 ug/mL-FEU — ABNORMAL HIGH (ref 0.00–0.50)

## 2019-08-13 MED ORDER — FUROSEMIDE 10 MG/ML IJ SOLN
40.0000 mg | Freq: Once | INTRAMUSCULAR | Status: AC
  Administered 2019-08-13: 40 mg via INTRAVENOUS
  Filled 2019-08-13: qty 4

## 2019-08-13 MED ORDER — INSULIN ASPART 100 UNIT/ML ~~LOC~~ SOLN
0.0000 [IU] | Freq: Three times a day (TID) | SUBCUTANEOUS | Status: DC
  Administered 2019-08-13: 13:00:00 2 [IU] via SUBCUTANEOUS
  Administered 2019-08-13: 17:00:00 1 [IU] via SUBCUTANEOUS
  Administered 2019-08-14: 2 [IU] via SUBCUTANEOUS
  Administered 2019-08-15 (×2): 3 [IU] via SUBCUTANEOUS
  Administered 2019-08-16: 17:00:00 2 [IU] via SUBCUTANEOUS
  Administered 2019-08-16: 13:00:00 1 [IU] via SUBCUTANEOUS
  Administered 2019-08-17: 09:00:00 2 [IU] via SUBCUTANEOUS

## 2019-08-13 NOTE — Progress Notes (Signed)
Call was received from [place] and no new orders were received, will continue to monitor

## 2019-08-13 NOTE — Plan of Care (Signed)
  Problem: Education: Goal: Knowledge of risk factors and measures for prevention of condition will improve Outcome: Progressing   Problem: Coping: Goal: Psychosocial and spiritual needs will be supported Outcome: Progressing   Problem: Respiratory: Goal: Will maintain a patent airway Outcome: Progressing   

## 2019-08-13 NOTE — Evaluation (Signed)
Occupational Therapy Evaluation Patient Details Name: Amy Chambers M Tackett MRN: [id number] DOB: [birth year] Today's Date: 08/13/2019    History of Present Illness 85 yo female presenting with ED via EMS with difficulty breathing. COVID-19 test positive. PMH including CAD, COPD, paroxysmal atrial fibrillation, systolic and diastolic CHF.   Clinical Impression   PTA, pt was living alone and was performing ADLs and IADLs; reporting recent use of RW due to weakness with "getting sick". Pt currently requires Mod A for UB ADLs, Max A for LB ADLs, and Min A for functional transfers. Pt presenting with significant fatigue impacting her safety and occupational performance/participation. At rest, SpO2 maintaining in 90s on 3L O2. With transfer to recliner, SpO2 dropping to 77% and pt requiring 4L O2 and cues for purse lip breathing to return to >88%. Pt will require further acute OT to facilitate safe dc. Recommend dc to SNF for further OT to optimize safety, independence with ADLs, and return to PLOF.      Follow Up Recommendations  SNF;Supervision/Assistance - 24 hour    Equipment Recommendations  Other (comment)(Defer to next venue)    Recommendations for Other Services PT consult     Precautions / Restrictions Precautions Precautions: Fall;Other (comment) Precaution Comments: Watch SpO2      Mobility Bed Mobility Overal bed mobility: Needs Assistance Bed Mobility: Supine to Sit     Supine to sit: Mod assist;HOB elevated     General bed mobility comments: Pt able to bring BLEs towards EOB and then pull on OT's hand into upright posture. Quickly fatigued and requires Mod A to bring hips towards EOB  Transfers Overall transfer level: Needs assistance Equipment used: None Transfers: Sit to/from UGI CorporationStand;Stand Pivot Transfers Sit to Stand: Min assist Stand pivot transfers: Min assist       General transfer comment: Min A to power up into standing and then maintain balance during pivot to  recliner    Balance Overall balance assessment: Needs assistance Sitting-balance support: No upper extremity supported;Feet supported Sitting balance-Leahy Scale: Poor     Standing balance support: Bilateral upper extremity supported;During functional activity Standing balance-Leahy Scale: Poor Standing balance comment: Reliant on UE support and physical A                           ADL either performed or assessed with clinical judgement   ADL Overall ADL's : Needs assistance/impaired Eating/Feeding: Set up;Sitting Eating/Feeding Details (indicate cue type and reason): Pt with decreased appetite. Grooming: Brushing hair;Sitting;Maximal assistance   Upper Body Bathing: Moderate assistance;Sitting   Lower Body Bathing: Maximal assistance;Sit to/from stand   Upper Body Dressing : Moderate assistance;Sitting   Lower Body Dressing: Maximal assistance;Sit to/from stand Lower Body Dressing Details (indicate cue type and reason): Max A to don socks due to significant fatigue Toilet Transfer: Minimal assistance;Stand-pivot(simulated to recliner)           Functional mobility during ADLs: Minimal assistance(stand pivot only) General ADL Comments: pt presenting with decreased activity tolerance, strength, and balance. Very fatigued and presenting with flat affect.      Vision Baseline Vision/History: Wears glasses       Perception     Praxis      Pertinent Vitals/Pain Pain Assessment: No/denies pain     Hand Dominance Right   Extremity/Trunk Assessment Upper Extremity Assessment Upper Extremity Assessment: Generalized weakness   Lower Extremity Assessment Lower Extremity Assessment: Defer to PT evaluation   Cervical / Trunk Assessment Cervical /  Trunk Assessment: Kyphotic   Communication Communication Communication: No difficulties   Cognition Arousal/Alertness: Awake/alert Behavior During Therapy: Flat affect Overall Cognitive Status:  Impaired/Different from baseline Area of Impairment: Orientation                 Orientation Level: Situation;Disoriented to             General Comments: Pt with flat affect and decreased motivation. Pt reporting she feels "so tired". Pt following commands and demonstrating WFL cognition for tasks completed. Oriented to time and place. Knows she is sick but did not know why; oriented to COVID diagnosis   General Comments  SpO2 >88% on 3L O2 at rest. During transfer to recliner, Spo2 dropping to 77% and required 4L O2 with seated rest break to return to >88%. Placed back on 3L O2 at end of session. Pt with deep belly breathing and SOB after mobility    Exercises     Shoulder Instructions      Home Living Family/patient expects to be discharged to:: Private residence Living Arrangements: Alone Available Help at Discharge: Family;Available PRN/intermittently Type of Home: House Home Access: Ramped entrance     Home Layout: One level     Bathroom Shower/Tub: Teacher, early years/pre: Standard     Home Equipment: Environmental consultant - 2 wheels   Additional Comments: Pt reporting that she has two daughters and a son who live nearby and can assist      Prior Functioning/Environment Level of Independence: Independent with assistive device(s)        Comments: Reports that she has been using her RW since she got sink. However, was not using any device prior and performed ADLs and IADLs. Also reports she drives        OT Problem List: Decreased strength;Decreased range of motion;Decreased activity tolerance;Impaired balance (sitting and/or standing);Decreased knowledge of use of DME or AE;Decreased knowledge of precautions;Cardiopulmonary status limiting activity      OT Treatment/Interventions: Self-care/ADL training;Therapeutic exercise;Energy conservation;DME and/or AE instruction;Therapeutic activities;Patient/family education    OT Goals(Current goals can be found  in the care plan section) Acute Rehab OT Goals Patient Stated Goal: "Get better from this" OT Goal Formulation: With patient Time For Goal Achievement: 08/27/19 Potential to Achieve Goals: Good  OT Frequency: Min 3X/week   Barriers to D/C:            Co-evaluation              AM-PAC OT "6 Clicks" Daily Activity     Outcome Measure Help from another person eating meals?: A Little Help from another person taking care of personal grooming?: A Lot Help from another person toileting, which includes using toliet, bedpan, or urinal?: A Little Help from another person bathing (including washing, rinsing, drying)?: A Lot Help from another person to put on and taking off regular upper body clothing?: A Lot Help from another person to put on and taking off regular lower body clothing?: A Lot 6 Click Score: 14   End of Session Equipment Utilized During Treatment: Oxygen(3-4 L) Nurse Communication: Mobility status  Activity Tolerance: Patient limited by fatigue Patient left: in chair;with call bell/phone within reach;with chair alarm set  OT Visit Diagnosis: Unsteadiness on feet (R26.81);Other abnormalities of gait and mobility (R26.89);Muscle weakness (generalized) (M62.81)                Time: 3220-2542 OT Time Calculation (min): 28 min Charges:  OT General Charges $OT Visit: 1 Visit OT  Evaluation $OT Eval Moderate Complexity: 1 Mod OT Treatments $Self Care/Home Management : 8-22 mins  Kellin Fifer MSOT, OTR/L Acute Rehab Pager: [phone] Office: [phone]  Theodoro Grist Benjamim Harnish 08/13/2019, 9:57 AM

## 2019-08-13 NOTE — Progress Notes (Signed)
1600- Family called for updates. Call went to voicemail. Message left for family to call back.

## 2019-08-13 NOTE — Progress Notes (Signed)
As per telemetry technician patient experienced 5 beats of SVT. Message to DR Vanita Ingles was send to notify.

## 2019-08-13 NOTE — Progress Notes (Signed)
PROGRESS NOTE                                                                                                                                                                                                             Patient Demographics:    Amy Chambers, is a 85 y.o. female, DOB - Jun 04, 1928, FTD:[id number]  Outpatient Primary MD for the patient is Chevis Pretty, [name]   Admit date - 08/03/2019   LOS - 2  Chief Complaint  Patient presents with  . Shortness of Breath    COVID+       Brief Narrative: Patient is a 85 y.o. female with PMHx of CAD-s/p PCI September 0623 complicated by hemopericardium, COPD, PAF, heart failure-who presented to the emergency room on 12/27 with shortness of breath-Per EMS report-patient was apparently satting 64% on room air.  She was evaluated in the emergency room-and thought to have COPD exacerbation and also found to have COVID-19.   Subjective:    Amy Chambers was lying comfortably in bed-she complained of a mild headache but otherwise was awake and alert.   Assessment  & Plan :   Acute Hypoxic Resp Failure secondary to COPD exacerbation and possible small pneumonia secondary to COVID-19 not seen on chest x-ray: Looks better-still requiring around 3-4 L of oxygen.  Continue steroids and remdesivir.  Inflammatory markers are stable.  Fever: afebrile  O2 requirements:  SpO2: 98 % O2 Flow Rate (L/min): 4 L/min   COVID-19 Labs: Recent Labs    07/24/2019 2243 08/12/19 0650 08/13/19 0340  DDIMER 2.98* 1.81* 2.37*  FERRITIN 597* 310* 257  LDH 336*  --   --   CRP 1.7* 1.2* 1.6*       Component Value Date/Time   BNP 1,838.0 (H) 07/29/2019 2029    Recent Labs  Lab 08/14/2019 2243  PROCALCITON 0.41    Lab Results  Component Value Date   SARSCOV2NAA Not Detected 06/21/2019   SARSCOV2NAA NOT DETECTED 05/28/2019   [place] NEGATIVE 05/10/2019     COVID-19  Medications: Steroids: 12/27>> Remdesivir: 12/28>>  Other medications: Antibiotics: No indication of bacterial infection-stop doxycycline on 12/28 Insulin: Monitor CBGs on SSI while on steroids.    Prone/Incentive Spirometry: encouraged  incentive spirometry use 3-4/hour.  DVT Prophylaxis  :  Lovenox   Hypokalemia: Repleted.  QTC prolongation: Resolved on 12-lead  EKG done on 12/28.  Follow magnesium and potassium.  Chronic diastolic heart failure: Volume status appears stable-Lasix 40 mg x 1 to maintain negative balance.  Suspect could be resumed on oral Lasix in the next few days.  Follow volume status closely.    CAD: Continue Plavix-no anginal symptoms.  PAF: Sinus rhythm-reviewed prior cardiology notes-not a anticoagulation secondary to anemia/GI bleeding-recent history of hemopericardium.  Continue amiodarone.  Dyslipidemia: Continue statin  Hypothyroidism: Continue Synthroid  Debility/deconditioning: Suspect has some amount of chronic debility/deconditioning at baseline-she appears very frail.  PT/OT ordered.  Goals of care: DNR in place.  Both family and patient do not want aggressive care with intubation/chest compressions etc.  Plan is to continue with gentle medical treatment-if she were to worsen significantly and not respond to medical treatment-plans would be then to transition to hospice care.   Consults  :  None  Procedures  :  None  ABG:    Component Value Date/Time   PHART 7.380 05/15/2019 0202   PCO2ART 37.9 05/15/2019 0202   PO2ART 240.0 (H) 05/15/2019 0202   HCO3 23.2 05/15/2019 0202   TCO2 22 05/15/2019 0220   ACIDBASEDEF 3.0 (H) 05/15/2019 0202   O2SAT 100.0 05/15/2019 0202    Vent Settings: N/A   Condition -Guarded  Family Communication  :  Daughter updated over the phone on 12/29  Code Status :  DNR  Diet :  Diet Order            Diet regular Room service appropriate? Yes; Fluid consistency: Thin  Diet effective now                Disposition Plan  :  Remain hospitalized  Barriers to discharge: Hypoxia requiring O2 supplementation/complete 5 days of IV Remdesivir  Antimicorbials  :    Anti-infectives (From admission, onward)   Start     Dose/Rate Route Frequency Ordered Stop   08/13/19 1000  remdesivir 100 mg in sodium chloride 0.9 % 100 mL IVPB     100 mg 200 mL/hr over 30 Minutes Intravenous Daily 08/12/19 1146 09/12/2019 0959   08/12/19 1300  remdesivir 200 mg in sodium chloride 0.9% 250 mL IVPB     200 mg 580 mL/hr over 30 Minutes Intravenous Once 08/12/19 1146 08/12/19 1400   01-Sep-2019 2230  doxycycline (VIBRAMYCIN) 100 mg in sodium chloride 0.9 % 250 mL IVPB  Status:  Discontinued     100 mg 125 mL/hr over 120 Minutes Intravenous Every 12 hours 09/01/2019 2216 08/12/19 1105      Inpatient Medications  Scheduled Meds: . albuterol  2 puff Inhalation Q6H  . amiodarone  100 mg Oral Q T,Th,S,Su  . amiodarone  200 mg Oral Q M,W,F  . vitamin C  500 mg Oral Daily  . clopidogrel  75 mg Oral Daily  . furosemide  40 mg Oral Daily  . heparin injection (subcutaneous)  5,000 Units Subcutaneous Q8H  . levothyroxine  100 mcg Oral Q0600  . methylPREDNISolone (SOLU-MEDROL) injection  40 mg Intravenous Q12H  . pantoprazole  40 mg Oral BID  . pravastatin  20 mg Oral QPM  . zinc sulfate  220 mg Oral Daily   Continuous Infusions: . remdesivir 100 mg in NS 100 mL 100 mg (08/13/19 0853)   PRN Meds:.acetaminophen, guaiFENesin-dextromethorphan, ondansetron **OR** ondansetron (ZOFRAN) IV, polyethylene glycol   Time Spent in minutes  35   See all Orders from today for further details   Jeoffrey Massed M.D on 08/13/2019 at 11:01  AM  To page go to [email or web address] - use universal password  [facility] -  Office  [phone]    Objective:   Vitals:   08/13/19 0724 08/13/19 0749 08/13/19 0800 08/13/19 0900  BP: 137/62 (!) 136/57    Pulse:  75    Resp: Temp:  98.5 F (36.9 C)    TempSrc:   Oral    SpO2:  98%    Weight:      Height:        Wt Readings from Last 3 Encounters:  08/12/19 51.6 kg  07/24/19 52.6 kg  07/12/19 53.1 kg     Intake/Output Summary (Last 24 hours) at 08/13/2019 1101 Last data filed at 08/13/2019 0830 Gross per 24 hour  Intake 650 ml  Output 200 ml  Net 450 ml     Physical Exam Gen Exam:Alert awake-not in any distress-but looks very frail and deconditioned. HEENT:atraumatic, normocephalic Chest: B/L clear to auscultation anteriorly CVS:S1S2 regular Abdomen:soft non tender, non distended Extremities:no edema Neurology: Non focal-but with generalized weakness  skin: no rash   Data Review:    CBC Recent Labs  Lab 2019-08-30 2029 08/12/19 0650 08/13/19 0340  WBC 13.3* 6.1 21.3*  HGB 11.2* 10.0* 10.7*  HCT 37.2 33.2* 36.2  PLT 551* 340 488*  MCV 103.6* 104.1* 106.5*  MCH 31.2 31.3 31.5  MCHC 30.1 30.1 29.6*  RDW 17.2* 17.1* 17.5*  LYMPHSABS 0.3* 0.3* 0.3*  MONOABS 0.8 0.3 0.8  EOSABS 0.0 0.0 0.0  BASOSABS 0.0 0.0 0.0    Chemistries  Recent Labs  Lab Aug 30, 2019 2029 2019/08/30 2243 08/12/19 0650 08/13/19 0340  NA 138  --  140 141  K 3.1*  --  3.3* 5.1  CL 91*  --  91* 98  CO2 33*  --  36* 33*  GLUCOSE 165*  --  121* 125*  BUN 21  --  23 30*  CREATININE 1.03*  --  1.08* 1.11*  CALCIUM 9.5  --  9.6 8.9  MG  --  2.8*  --   --   AST 73*  --  33 27  ALT 31  --  28 27  ALKPHOS 84  --  69 69  BILITOT 1.2  --  0.6 0.7   ------------------------------------------------------------------------------------------------------------------ Recent Labs    30-Aug-2019 2243  TRIG 179*    No results found for: HGBA1C ------------------------------------------------------------------------------------------------------------------ No results for input(s): TSH, T4TOTAL, T3FREE, THYROIDAB in the last 72 hours.  Invalid input(s):  FREET3 ------------------------------------------------------------------------------------------------------------------ Recent Labs    08/12/19 0650 08/13/19 0340  FERRITIN 310* 257    Coagulation profile No results for input(s): INR, PROTIME in the last 168 hours.  Recent Labs    08/12/19 0650 08/13/19 0340  DDIMER 1.81* 2.37*    Cardiac Enzymes No results for input(s): CKMB, TROPONINI, MYOGLOBIN in the last 168 hours.  Invalid input(s): CK ------------------------------------------------------------------------------------------------------------------    Component Value Date/Time   BNP 1,838.0 (H) Aug 30, 2019 2029    Micro Results No results found for this or any previous visit (from the past 240 hour(s)).  Radiology Reports MR Cervical Spine Wo Contrast  Result Date: 07/20/2019 CLINICAL DATA:  Headache, neck pain. Unable to ambulate. Weakness. EXAM: MRI CERVICAL SPINE WITHOUT CONTRAST TECHNIQUE: Multiplanar, multisequence MR imaging of the cervical spine was performed. No intravenous contrast was administered. COMPARISON:  Neck CT 05/10/2019 FINDINGS: Alignment: Normal overall alignment. There is mild degenerative posterior subluxation of C4 and C5. Vertebrae: Normal marrow signal. No bone lesions  or fractures. Cord: Normal cord signal intensity. No cord lesions or syrinx. Posterior Fossa, vertebral arteries, paraspinal tissues: No significant findings. Disc levels: C2-3: No significant findings. C3-4: Degenerated bulging and uncovered disc along with osteophytic ridging and uncinate spurring. There is flattening of the ventral thecal sac and narrowing the ventral CSF space. Mild left foraminal stenosis mainly due to asymmetric left-sided facet disease. C4-5: Bulging uncovered disc, osteophytic ridging and uncinate spurring. Flattening of the ventral thecal sac and narrowing the ventral CSF space. No significant foraminal stenosis. C5-6: Bulging slightly uncovered disc with  osteophytic ridging and uncinate spurring. There is flattening of the ventral thecal sac and narrowing the ventral CSF space. Right-sided disc osteophyte complex with moderate right foraminal stenosis. C6-7: Mild diffuse annular bulge with slight flattening of the ventral thecal sac. Mild left foraminal encroachment but no significant foraminal stenosis. C7-T1: No significant findings. IMPRESSION: 1. Degenerative cervical spondylosis with multilevel disc disease and facet disease. 2. Bulging degenerated discs, osteophytic ridging and uncinate spurring mainly at C3-4, C4-5 and C5-6. No significant spinal stenosis. 3. Mild left foraminal stenosis at C3-4. 4. Moderate right foraminal stenosis at C5-6. Electronically Signed   By: P.  Gallerani M.D.   On: 12/05/Rudie Meyer2020 15:09   DG Chest Portable 1 View  Result Date: 08/10/2019 CLINICAL DATA:  Shortness of breath EXAM: PORTABLE CHEST 1 VIEW COMPARISON:  Radiograph 06/14/2019, CT 05/14/2019 FINDINGS: Lungs appear hyperexpanded, similar to comparison. Stable bronchiectatic changes again seen in the right lower lobe. No consolidation, features of edema, pneumothorax, or effusion. The aorta is calcified. The remaining cardiomediastinal contours are unremarkable. Postsurgical changes related to prior CABG including intact and aligned sternotomy wires and multiple surgical clips projecting over the mediastinum. No acute osseous or soft tissue abnormality. Degenerative changes are present in the imaged spine and shoulders. IMPRESSION: 1. No acute cardiopulmonary abnormality 2. Stable hyperinflation and bronchiectatic changes in the right lower lobe. 3. Stable postoperative changes related to prior CABG. 4. Aortic atherosclerosis. Electronically Signed   By: Kreg ShropshirePrice  DeHay M.D.   On: 08/10/2019 20:24   DG Chest Port 1V today  Result Date: 08/12/2019 CLINICAL DATA:  Shortness of breath EXAM: PORTABLE CHEST 1 VIEW COMPARISON:  07/28/2019 FINDINGS: Background changes of COPD.  Lung aeration is similar with slightly increased retrocardiac density probably reflecting atelectasis. No significant pleural effusion. No pneumothorax. Stable cardiomediastinal contours. IMPRESSION: Slightly increased retrocardiac density probably reflecting atelectasis. Otherwise unchanged. Electronically Signed   By: Guadlupe SpanishPraneil  Patel M.D.   On: 08/12/2019 08:20

## 2019-08-14 DIAGNOSIS — U071 COVID-19: Secondary | ICD-10-CM

## 2019-08-14 DIAGNOSIS — I5032 Chronic diastolic (congestive) heart failure: Secondary | ICD-10-CM

## 2019-08-14 DIAGNOSIS — E876 Hypokalemia: Secondary | ICD-10-CM

## 2019-08-14 DIAGNOSIS — E43 Unspecified severe protein-calorie malnutrition: Secondary | ICD-10-CM

## 2019-08-14 LAB — COMPREHENSIVE METABOLIC PANEL
ALT: 23 U/L (ref 0–44)
AST: 22 U/L (ref 15–41)
Albumin: 3 g/dL — ABNORMAL LOW (ref 3.5–5.0)
Alkaline Phosphatase: 66 U/L (ref 38–126)
Anion gap: 11 (ref 5–15)
BUN: 43 mg/dL — ABNORMAL HIGH (ref 8–23)
CO2: 34 mmol/L — ABNORMAL HIGH (ref 22–32)
Calcium: 8.9 mg/dL (ref 8.9–10.3)
Chloride: 98 mmol/L (ref 98–111)
Creatinine, Ser: 0.97 mg/dL (ref 0.44–1.00)
GFR calc Af Amer: >60 mL/min (ref >60)
GFR calc non Af Amer: 52 mL/min — ABNORMAL LOW (ref >60)
Glucose, Bld: 126 mg/dL — ABNORMAL HIGH (ref 70–99)
Potassium: 4.2 mmol/L (ref 3.5–5.1)
Sodium: 143 mmol/L (ref 135–145)
Total Bilirubin: 0.9 mg/dL (ref 0.3–1.2)
Total Protein: 5.7 g/dL — ABNORMAL LOW (ref 6.5–8.1)

## 2019-08-14 LAB — CBC WITH DIFFERENTIAL/PLATELET
Abs Immature Granulocytes: 0.16 10*3/uL — ABNORMAL HIGH (ref 0.00–0.07)
Basophils Absolute: 0 10*3/uL (ref 0.0–0.1)
Basophils Relative: 0 %
Eosinophils Absolute: 0 10*3/uL (ref 0.0–0.5)
Eosinophils Relative: 0 %
HCT: 34.4 % — ABNORMAL LOW (ref 36.0–46.0)
Hemoglobin: 10.2 g/dL — ABNORMAL LOW (ref 12.0–15.0)
Immature Granulocytes: 1 %
Lymphocytes Relative: 3 %
Lymphs Abs: 0.4 10*3/uL — ABNORMAL LOW (ref 0.7–4.0)
MCH: 31.1 pg (ref 26.0–34.0)
MCHC: 29.7 g/dL — ABNORMAL LOW (ref 30.0–36.0)
MCV: 104.9 fL — ABNORMAL HIGH (ref 80.0–100.0)
Monocytes Absolute: 0.6 10*3/uL (ref 0.1–1.0)
Monocytes Relative: 5 %
Neutro Abs: 11.1 10*3/uL — ABNORMAL HIGH (ref 1.7–7.7)
Neutrophils Relative %: 91 %
Platelets: 336 10*3/uL (ref 150–400)
RBC: 3.28 MIL/uL — ABNORMAL LOW (ref 3.87–5.11)
RDW: 17.2 % — ABNORMAL HIGH (ref 11.5–15.5)
WBC: 12.3 10*3/uL — ABNORMAL HIGH (ref 4.0–10.5)
nRBC: 0 % (ref 0.0–0.2)

## 2019-08-14 LAB — GLUCOSE, CAPILLARY
Glucose-Capillary: 126 mg/dL — ABNORMAL HIGH (ref 70–99)
Glucose-Capillary: 183 mg/dL — ABNORMAL HIGH (ref 70–99)
Glucose-Capillary: 125 mg/dL — ABNORMAL HIGH (ref 70–99)

## 2019-08-14 LAB — C-REACTIVE PROTEIN: CRP: 5.5 mg/dL — ABNORMAL HIGH (ref <1.0)

## 2019-08-14 LAB — FERRITIN: Ferritin: 213 ng/mL (ref 11–307)

## 2019-08-14 LAB — D-DIMER, QUANTITATIVE: D-Dimer, Quant: 1.95 ug/mL-FEU — ABNORMAL HIGH (ref 0.00–0.50)

## 2019-08-14 MED ORDER — IBUPROFEN 200 MG PO TABS
200.0000 mg | ORAL_TABLET | ORAL | Status: DC | PRN
  Filled 2019-08-14 (×2): qty 1

## 2019-08-14 NOTE — Plan of Care (Signed)
  Problem: Education: Goal: Knowledge of risk factors and measures for prevention of condition will improve Outcome: Progressing   

## 2019-08-14 NOTE — Evaluation (Signed)
Physical Therapy Evaluation Patient Details Name: Amy Chambers MRN: [id number] DOB: 14-Oct-1927 Today's Date: 08/14/2019   History of Present Illness  85 yo female presenting with ED via EMS with difficulty breathing. COVID-19 test positive. PMH including CAD, COPD, paroxysmal atrial fibrillation, systolic and diastolic CHF.  Clinical Impression  The patient is very weak, required extensive max assist to stand and take afew steps to recliner. Patient reports being home with children checking in on her.. Difficult getting patient to provide information about her PLOF, relied on OT information from visit yesterday. Patient went to [facility] center per notes after last admission to [facility] in October. Pt admitted with above diagnosis.   Pt currently with functional limitations due to the deficits listed below (see PT Problem List). Pt will benefit from skilled PT to increase their independence and safety with mobility to allow discharge to the venue listed below.       Follow Up Recommendations SNF;Supervision/Assistance - 24 hour    Equipment Recommendations  None recommended by PT    Recommendations for Other Services       Precautions / Restrictions Precautions Precautions: Fall;Other (comment) Precaution Comments: Watch SpO2      Mobility  Bed Mobility Overal bed mobility: Needs Assistance Bed Mobility: Rolling;Sit to Supine Rolling: Min assist   Supine to sit: Mod assist;HOB elevated     General bed mobility comments: Pt able to bring BLEs towards EOB, HOB raised, assisted trunk to get upright, assisted to scoot.  Transfers Overall transfer level: Needs assistance Equipment used: None Transfers: Sit to/from [employer] Sit to Stand: Mod assist Stand pivot transfers: Mod assist       General transfer comment: mod assist to power up, bear hug technique, patient  unable to fully stand erect. shuffle steps to recliner.  Ambulation/Gait                 Stairs            Wheelchair Mobility    Modified Rankin (Stroke Patients Only)       Balance   Sitting-balance support: Feet supported;Bilateral upper extremity supported Sitting balance-Leahy Scale: Poor     Standing balance support: Bilateral upper extremity supported;During functional activity Standing balance-Leahy Scale: Poor Standing balance comment: Reliant on UE support and physical A                             Pertinent Vitals/Pain Pain Assessment: Faces Faces Pain Scale: Hurts little more Pain Location: general Pain Descriptors / Indicators: Discomfort;Grimacing Pain Intervention(s): Monitored during session    Home Living Family/patient expects to be discharged to:: Private residence Living Arrangements: Alone Available Help at Discharge: Family;Available PRN/intermittently Type of Home: House Home Access: Ramped entrance     Home Layout: One level Home Equipment: Walker - 2 wheels Additional Comments: Pt reporting that she has two daughters and a son who live nearby and can assist    Prior Function Level of Independence: Independent with assistive device(s)         Comments: Reports that she has been using her RW since she got sick. However, was not using any device prior and performed ADLs and IADLs. Also reports she drives- unsure of recent accuracy as was in [facility] for extended time     Hand Dominance        Extremity/Trunk Assessment   Upper Extremity Assessment Upper Extremity Assessment: Generalized weakness    Lower Extremity  Assessment Lower Extremity Assessment: Generalized weakness    Cervical / Trunk Assessment Cervical / Trunk Assessment: Kyphotic  Communication   Communication: No difficulties  Cognition Arousal/Alertness: Awake/alert Behavior During Therapy: Flat affect Overall Cognitive Status: No family/caregiver present to determine baseline cognitive functioning Area of Impairment:  Orientation;Awareness                 Orientation Level: Situation;Disoriented to     Following Commands: Follows one step commands consistently       General Comments: Pt with flat affect and decreased motivation. Pt reporting she feels "so tired". Pt following commands and demonstrating WFL cognition for tasks completed.      General Comments      Exercises Other Exercises Other Exercises: IS x 5 very weak effort   Assessment/Plan    PT Assessment Patient needs continued PT services  PT Problem List Decreased strength;Decreased mobility;Decreased safety awareness;Decreased knowledge of precautions;Decreased activity tolerance;Cardiopulmonary status limiting activity;Decreased balance;Decreased knowledge of use of DME       PT Treatment Interventions DME instruction;Therapeutic activities;Gait training;Therapeutic exercise;Functional mobility training;Patient/family education    PT Goals (Current goals can be found in the Care Plan section)  Acute Rehab PT Goals Patient Stated Goal: "Get better from this" PT Goal Formulation: With patient Time For Goal Achievement: 08/28/19 Potential to Achieve Goals: Fair    Frequency Min 2X/week   Barriers to discharge        Co-evaluation               AM-PAC PT "6 Clicks" Mobility  Outcome Measure Help needed turning from your back to your side while in a flat bed without using bedrails?: Total Help needed moving from lying on your back to sitting on the side of a flat bed without using bedrails?: Total Help needed moving to and from a bed to a chair (including a wheelchair)?: Total Help needed standing up from a chair using your arms (e.g., wheelchair or bedside chair)?: Total Help needed to walk in hospital room?: Total Help needed climbing 3-5 steps with a railing? : Total 6 Click Score: 6    End of Session Equipment Utilized During Treatment: Oxygen Activity Tolerance: Patient limited by fatigue Patient  left: in chair;with call bell/phone within reach;with chair alarm set Nurse Communication: Mobility status PT Visit Diagnosis: Unsteadiness on feet (R26.81)    Time: 3220-2542 PT Time Calculation (min) (ACUTE ONLY): 31 min   Charges:   PT Evaluation $PT Eval Moderate Complexity: 1 Mod PT Treatments $Therapeutic Activity: 8-22 mins        [place] Pager [phone] Office [phone]   Claretha Cooper 08/14/2019, 1:38 PM

## 2019-08-14 NOTE — Progress Notes (Signed)
[facility] PROGRESS NOTE    Progress Note  Amy Chambers  OJJ:[id number] DOB: [birth year] DOA: 08-30-2019 PCP: Bennie Pierini, [name]     Brief Narrative:   Amy Chambers is an 85 y.o. female past medical history coronary artery disease status PCI on September 2020 complicated by hemopericardium, COPD, paroxysmal atrial fibrillation who presents to the emergency room on August 30, 2019 for shortness of breath per EMS she was satting 64% on room air.  Notes with COVID-19 in the hospital.  Assessment/Plan:   Acute respiratory failure with hypoxia secondarily to pneumonia due to COVID-19: She still requiring 3 to 4 L of oxygen to keep saturations greater than 94%. Continue IV steroids and IV remdesivir.  Her inflammatory markers are improving. Has remained afebrile, tolerating her diet. Follow strict I's and O's and daily weights. Try to keep the patient prone for early 16 hours a day, if not prone out of bed to chair, continue to use incentive spirometry.  Hypokalemia: Repleted now resolved  Prolonged QTC  Hypokalemia likely attributing to prolonged QTC. Magnesium was 2.8.  Chronic diastolic heart failure Appears to be euvolemic, she was given a single dose of IV Lasix on 08/13/2019 and she is mildly negative. We will continue to monitor strict I's and O's, potassium and creatinine seems to be stable.  Hypothyroidism: Continue Synthroid.  CAD: Continue Plavix she is chest pain-free.  PAF (paroxysmal atrial fibrillation) (HCC) He is currently on sinus rhythm, continue amiodarone she is not a candidate for anticoagulation due to recent GI bleed and hemopericardium.  Protein-calorie malnutrition, severe (HCC)  Debility/deconditioning: Likely chronic at baseline she appears to be frail at baseline. Physical therapy was consulted  Ethics/palliative care/goals of care: Patient is a DNR both the patient and the family do not want aggressive care like intubation chest  compressions. They want to continue current medical management with no further escalation of care.  And if she does not respond to treatment they were proceeds towards hospice care.    DVT prophylaxis: lovenox Family Communication:none Disposition Plan/Barrier to D/C: unable to determine Code Status:     Code Status Orders  (From admission, onward)         Start     Ordered   08/12/19 0525  Do not attempt resuscitation (DNR)  Continuous    Question Answer Comment  In the event of cardiac or respiratory ARREST Do not call a "code blue"   In the event of cardiac or respiratory ARREST Do not perform Intubation, CPR, defibrillation or ACLS   In the event of cardiac or respiratory ARREST Use medication by any route, position, wound care, and other measures to relive pain and suffering. May use oxygen, suction and manual treatment of airway obstruction as needed for comfort.      08/12/19 0524        Code Status History    Date Active Date Inactive Code Status Order ID Comments User Context   08/12/2019 0245 08/12/2019 0524 Full Code [id number]  Onnie Boer, MD Inpatient   05/28/2019 1559 05/31/2019 1843 DNR [id number]  Alba Cory, MD Inpatient   05/16/2019 1354 05/28/2019 1559 Partial Code [id number]  Luciano Cutter, MD Inpatient   05/11/2019 0624 05/16/2019 1354 Full Code [id number]  Macon Large, NP Inpatient   05/10/2019 2102 05/11/2019 4315 DNR [id number]  Levie Heritage, DO ED   Advance Care Planning Activity        IV Access:    Peripheral IV   Procedures  and diagnostic studies:   DG Chest Port 1V today  Result Date: 08/12/2019 CLINICAL DATA:  Shortness of breath EXAM: PORTABLE CHEST 1 VIEW COMPARISON:  09-06-2019 FINDINGS: Background changes of COPD. Lung aeration is similar with slightly increased retrocardiac density probably reflecting atelectasis. No significant pleural effusion. No pneumothorax. Stable cardiomediastinal contours.  IMPRESSION: Slightly increased retrocardiac density probably reflecting atelectasis. Otherwise unchanged. Electronically Signed   By: Macy Mis M.D.   On: 08/12/2019 08:20     Medical Consultants:    None.  Anti-Infectives:   IV remdesivir  Subjective:    [name] no complaints this morning.  Objective:    Vitals:   08/13/19 1601 08/13/19 2035 08/14/19 0400 08/14/19 0716  BP: 116/68 (!) 113/59 (!) 149/90 133/73  Pulse: 82 73 73   Resp: 20 18 20  (!) 21  Temp: 98.8 F (37.1 C) 97.8 F (36.6 C) 98 F (36.7 C) 98 F (36.7 C)  TempSrc: Oral Oral Oral Oral  SpO2: 94% 98% 97%   Weight:      Height:       SpO2: 97 % O2 Flow Rate (L/min): 3 L/min   Intake/Output Summary (Last 24 hours) at 08/14/2019 0746 Last data filed at 08/14/2019 0414 Gross per 24 hour  Intake 680 ml  Output 1302 ml  Net -622 ml   Filed Weights   06-Sep-2019 1943 08/12/19 0224  Weight: 52.6 kg 51.6 kg    Exam: General exam: In no acute distress. Respiratory system: Good air movement and diffuse crackles bilaterally. Cardiovascular system: S1 & S2 heard, RRR. No JVD. Gastrointestinal system: Abdomen is nondistended, soft and nontender.  Central nervous system: Alert and oriented. No focal neurological deficits. Extremities: No pedal edema. Skin: No rashes, lesions or ulcers Psychiatry: Judgement and insight appear normal. Mood & affect appropriate.    Data Reviewed:    Labs: Basic Metabolic Panel: Recent Labs  Lab 09-06-19 2029 2019/09/06 2243 08/12/19 0650 08/13/19 0340 08/14/19 0405  NA 138  --  140 141 143  K 3.1*  --  3.3* 5.1 4.2  CL 91*  --  91* 98 98  CO2 33*  --  36* 33* 34*  GLUCOSE 165*  --  121* 125* 126*  BUN 21  --  23 30* 43*  CREATININE 1.03*  --  1.08* 1.11* 0.97  CALCIUM 9.5  --  9.6 8.9 8.9  MG  --  2.8*  --   --   --    GFR Estimated Creatinine Clearance: 26.8 mL/min (by C-G formula based on SCr of 0.97 mg/dL). Liver Function Tests: Recent Labs    Lab September 06, 2019 2029 08/12/19 0650 08/13/19 0340 08/14/19 0405  AST 73* 33 27 22  ALT 31 28 27 23   ALKPHOS 84 69 69 66  BILITOT 1.2 0.6 0.7 0.9  PROT 6.6 5.4* 5.6* 5.7*  ALBUMIN 3.2* 2.8* 3.1* 3.0*   No results for input(s): LIPASE, AMYLASE in the last 168 hours. No results for input(s): AMMONIA in the last 168 hours. Coagulation profile No results for input(s): INR, PROTIME in the last 168 hours. [place]    2019/09/06 2243 08/12/19 0650 08/13/19 0340 08/14/19 0405  DDIMER 2.98* 1.81* 2.37* 1.95*  FERRITIN 597* 310* 257  --   LDH 336*  --   --   --   CRP 1.7* 1.2* 1.6*  --     Lab Results  Component Value Date   SARSCOV2NAA Not Detected 06/21/2019   SARSCOV2NAA NOT  DETECTED 05/28/2019   SARSCOV2NAA NEGATIVE 05/10/2019    CBC: Recent Labs  Lab November 20, 2018 2029 08/12/19 0650 08/13/19 0340 08/14/19 0405  WBC 13.3* 6.1 21.3* 12.3*  NEUTROABS 11.9* 5.3 19.8* 11.1*  HGB 11.2* 10.0* 10.7* 10.2*  HCT 37.2 33.2* 36.2 34.4*  MCV 103.6* 104.1* 106.5* 104.9*  PLT 551* 340 488* 336   Cardiac Enzymes: No results for input(s): CKTOTAL, CKMB, CKMBINDEX, TROPONINI in the last 168 hours. BNP (last 3 results) No results for input(s): PROBNP in the last 8760 hours. CBG: Recent Labs  Lab 08/13/19 1248 08/13/19 1701 08/13/19 1956  GLUCAP 162* 143* 125*   D-Dimer: Recent Labs    08/13/19 0340 08/14/19 0405  DDIMER 2.37* 1.95*   Hgb A1c: No results for input(s): HGBA1C in the last 72 hours. Lipid Profile: Recent Labs    November 20, 2018 2243  TRIG 179*   Thyroid function studies: No results for input(s): TSH, T4TOTAL, T3FREE, THYROIDAB in the last 72 hours.  Invalid input(s): FREET3 Anemia work up: Recent Labs    08/12/19 0650 08/13/19 0340  FERRITIN 310* 257   Sepsis Labs: Recent Labs  Lab November 20, 2018 2029 November 20, 2018 2243 08/12/19 0650 08/13/19 0340 08/14/19 0405  PROCALCITON  --  0.41  --   --   --   WBC 13.3*  --  6.1 21.3* 12.3*   LATICACIDVEN  --  3.2*  --   --   --    Microbiology No results found for this or any previous visit (from the past 240 hour(s)).   Medications:   . albuterol  2 puff Inhalation Q6H  . amiodarone  100 mg Oral Q T,Th,S,Su  . amiodarone  200 mg Oral Q M,W,F  . vitamin C  500 mg Oral Daily  . clopidogrel  75 mg Oral Daily  . heparin injection (subcutaneous)  5,000 Units Subcutaneous Q8H  . insulin aspart  0-9 Units Subcutaneous TID WC  . levothyroxine  100 mcg Oral Q0600  . methylPREDNISolone (SOLU-MEDROL) injection  40 mg Intravenous Q12H  . pantoprazole  40 mg Oral BID  . pravastatin  20 mg Oral QPM  . zinc sulfate  220 mg Oral Daily   Continuous Infusions: . remdesivir 100 mg in NS 100 mL Stopped (08/13/19 0925)      LOS: 3 days   Marinda ElkAbraham Feliz Ortiz  [facility]  08/14/2019, 7:46 AM

## 2019-08-14 NOTE — Progress Notes (Signed)
1640- Updates given to the daughter. Time for question. RN was able to family talk to the patient.

## 2019-08-15 ENCOUNTER — Inpatient Hospital Stay (HOSPITAL_COMMUNITY): Payer: Medicare Other

## 2019-08-15 LAB — GLUCOSE, CAPILLARY
Glucose-Capillary: 120 mg/dL — ABNORMAL HIGH (ref 70–99)
Glucose-Capillary: 231 mg/dL — ABNORMAL HIGH (ref 70–99)
Glucose-Capillary: 240 mg/dL — ABNORMAL HIGH (ref 70–99)
Glucose-Capillary: 180 mg/dL — ABNORMAL HIGH (ref 70–99)

## 2019-08-15 LAB — D-DIMER, QUANTITATIVE: D-Dimer, Quant: 1.98 ug/mL-FEU — ABNORMAL HIGH (ref 0.00–0.50)

## 2019-08-15 LAB — CBC WITH DIFFERENTIAL/PLATELET
Abs Immature Granulocytes: 0.17 10*3/uL — ABNORMAL HIGH (ref 0.00–0.07)
Basophils Absolute: 0 10*3/uL (ref 0.0–0.1)
Basophils Relative: 0 %
Eosinophils Absolute: 0 10*3/uL (ref 0.0–0.5)
Eosinophils Relative: 0 %
HCT: 33.8 % — ABNORMAL LOW (ref 36.0–46.0)
Hemoglobin: 9.9 g/dL — ABNORMAL LOW (ref 12.0–15.0)
Immature Granulocytes: 2 %
Lymphocytes Relative: 3 %
Lymphs Abs: 0.3 10*3/uL — ABNORMAL LOW (ref 0.7–4.0)
MCH: 31.1 pg (ref 26.0–34.0)
MCHC: 29.3 g/dL — ABNORMAL LOW (ref 30.0–36.0)
MCV: 106.3 fL — ABNORMAL HIGH (ref 80.0–100.0)
Monocytes Absolute: 0.3 10*3/uL (ref 0.1–1.0)
Monocytes Relative: 3 %
Neutro Abs: 9.7 10*3/uL — ABNORMAL HIGH (ref 1.7–7.7)
Neutrophils Relative %: 92 %
Platelets: 320 10*3/uL (ref 150–400)
RBC: 3.18 MIL/uL — ABNORMAL LOW (ref 3.87–5.11)
RDW: 17 % — ABNORMAL HIGH (ref 11.5–15.5)
WBC: 10.6 10*3/uL — ABNORMAL HIGH (ref 4.0–10.5)
nRBC: 0 % (ref 0.0–0.2)

## 2019-08-15 LAB — COMPREHENSIVE METABOLIC PANEL
ALT: 23 U/L (ref 0–44)
AST: 21 U/L (ref 15–41)
Albumin: 3 g/dL — ABNORMAL LOW (ref 3.5–5.0)
Alkaline Phosphatase: 62 U/L (ref 38–126)
Anion gap: 12 (ref 5–15)
BUN: 44 mg/dL — ABNORMAL HIGH (ref 8–23)
CO2: 36 mmol/L — ABNORMAL HIGH (ref 22–32)
Calcium: 8.7 mg/dL — ABNORMAL LOW (ref 8.9–10.3)
Chloride: 96 mmol/L — ABNORMAL LOW (ref 98–111)
Creatinine, Ser: 0.91 mg/dL (ref 0.44–1.00)
GFR calc Af Amer: >60 mL/min (ref >60)
GFR calc non Af Amer: 56 mL/min — ABNORMAL LOW (ref >60)
Glucose, Bld: 136 mg/dL — ABNORMAL HIGH (ref 70–99)
Potassium: 4.2 mmol/L (ref 3.5–5.1)
Sodium: 144 mmol/L (ref 135–145)
Total Bilirubin: 0.8 mg/dL (ref 0.3–1.2)
Total Protein: 5.4 g/dL — ABNORMAL LOW (ref 6.5–8.1)

## 2019-08-15 LAB — C-REACTIVE PROTEIN: CRP: 2.9 mg/dL — ABNORMAL HIGH (ref <1.0)

## 2019-08-15 LAB — FERRITIN: Ferritin: 199 ng/mL (ref 11–307)

## 2019-08-15 MED ORDER — INSULIN DETEMIR 100 UNIT/ML ~~LOC~~ SOLN
5.0000 [IU] | Freq: Every day | SUBCUTANEOUS | Status: DC
  Administered 2019-08-15 – 2019-08-17 (×3): 5 [IU] via SUBCUTANEOUS
  Filled 2019-08-15 (×3): qty 0.05

## 2019-08-15 MED ORDER — SODIUM CHLORIDE 0.9 % IV SOLN: INTRAVENOUS | Status: AC

## 2019-08-15 MED ORDER — IOHEXOL 350 MG/ML SOLN
75.0000 mL | Freq: Once | INTRAVENOUS | Status: AC | PRN
  Administered 2019-08-15: 75 mL via INTRAVENOUS

## 2019-08-15 NOTE — Progress Notes (Signed)
[facility] PROGRESS NOTE    Progress Note  Amy Chambers  ONG:[id number] DOB: October 09, 1927 DOA: Aug 12, 2019 PCP: Bennie Pierini, [name]     Brief Narrative:   Amy Chambers is an 85 y.o. female past medical history coronary artery disease status PCI on September 2020 complicated by hemopericardium, COPD, paroxysmal atrial fibrillation who presents to the emergency room on 08-12-19 for shortness of breath per EMS she was satting 64% on room air.  Notes with COVID-19 in the hospital.  Assessment/Plan:   Acute respiratory failure with hypoxia secondarily to pneumonia due to COVID-19: She is still requiring 3 to 4 L to keep saturations greater than 92%. Continue IV steroids and IV remdesivir, her inflammatory markers continue to improve. Follow strict I's and O's and daily weights, try to keep the patient prone for 16 hours a day, if not prone out of bed to chair continue to use incentive spirometry. Patient seems to be a little bit less sleepy than yesterday see below for further details.  Acute metabolic encephalopathy: In the setting of dehydration and SARS-CoV-2 infection. She was hydrated and treated conservatively the patient seems to be perking up today. She is nonfocal on physical exam. Placed n.p.o., check a swallowing evaluation, the patient started coughing when provided water.  Hypokalemia: Replete orally now resolved.  Acute kidney injury: With a baseline creatinine of 0.7, on admission it was 1.1. Likely hemodynamically mediated and decreased oral intake, will start on IV fluid hydration recheck basic metabolic panel in the morning.  Prolonged QTC  Hypokalemia likely attributing to prolonged QTC. Magnesium was 2.8.  Chronic diastolic heart failure Appears to be euvolemic, she is mildly negative continue to follow strict I's and O's and daily weights replete potassium and monitor creatinine.    Hypothyroidism: Continue Synthroid.  CAD: Continue Plavix  she is chest pain-free.  PAF (paroxysmal atrial fibrillation) (HCC) He is currently on sinus rhythm, continue amiodarone she is not a candidate for anticoagulation due to recent GI bleed and hemopericardium.  Protein-calorie malnutrition, severe (HCC)  Debility/deconditioning: Likely chronic at baseline she appears to be frail at baseline. Physical therapy was consulted  Ethics/palliative care/goals of care: Patient is a DNR both the patient and the family do not want aggressive care like intubation chest compressions. They want to continue current medical management with no further escalation of care.  And if she does not respond to treatment they were proceeds towards hospice care.   DVT prophylaxis: lovenox Family Communication:none Disposition Plan/Barrier to D/C: unable to determine Code Status:     Code Status Orders  (From admission, onward)         Start     Ordered   08/12/19 0525  Do not attempt resuscitation (DNR)  Continuous    Question Answer Comment  In the event of cardiac or respiratory ARREST Do not call a "code blue"   In the event of cardiac or respiratory ARREST Do not perform Intubation, CPR, defibrillation or ACLS   In the event of cardiac or respiratory ARREST Use medication by any route, position, wound care, and other measures to relive pain and suffering. May use oxygen, suction and manual treatment of airway obstruction as needed for comfort.      08/12/19 0524        Code Status History    Date Active Date Inactive Code Status Order ID Comments User Context   08/12/2019 0245 08/12/2019 0524 Full Code [id number]  Onnie Boer, MD Inpatient   05/28/2019 [phone]  05/31/2019 1843 DNR [id number]  Alba Coryegalado, Belkys A, MD Inpatient   05/16/2019 1354 05/28/2019 1559 Partial Code [id number]  Luciano CutterEllison, Chi Jane, MD Inpatient   05/11/2019 0624 05/16/2019 1354 Full Code [id number]  Macon LargeBodenheimer, Charles A, NP Inpatient   05/10/2019 2102 05/11/2019 0624 DNR  [id number]  Levie HeritageStinson, Jacob J, DO ED   Advance Care Planning Activity        IV Access:    Peripheral IV   Procedures and diagnostic studies:   No results found.   Medical Consultants:    None.  Anti-Infectives:   IV remdesivir  Subjective:    Amy Chambers more awake she is complaining she is thirsty.  No further complaints.  Objective:    Vitals:   08/14/19 2009 08/15/19 0000 08/15/19 0346 08/15/19 0726  BP: (!) 115/52  136/66 (!) 133/56  Pulse:  65 70 71  Resp:  18 17 18   Temp: (!) 97.3 F (36.3 C)  97.8 F (36.6 C) 97.9 F (36.6 C)  TempSrc: Oral  Oral Oral  SpO2:  99% 99% 98%  Weight:      Height:       SpO2: 98 % O2 Flow Rate (L/min): 4 L/min   Intake/Output Summary (Last 24 hours) at 08/15/2019 0727 Last data filed at 08/15/2019 [id number] Gross per 24 hour  Intake 260 ml  Output 450 ml  Net -190 ml   Filed Weights   Dec 06, 2018 1943 08/12/19 0224  Weight: 52.6 kg 51.6 kg    Exam: General exam: In no acute distress. Respiratory system: Good air movement and clear to auscultation. Cardiovascular system: S1 & S2 heard, RRR. No JVD. Gastrointestinal system: Abdomen is nondistended, soft and nontender.  Central nervous system: Alert and oriented. No focal neurological deficits. Extremities: No pedal edema. Skin: No rashes, lesions or ulcers Psychiatry: Amy Chambers appears to be more awake today she is complaining of being significantly thirsty.   Data Reviewed:    Labs: Basic Metabolic Panel: Recent Labs  Lab Dec 06, 2018 2029 Dec 06, 2018 2243 08/12/19 0650 08/13/19 0340 08/14/19 0405 08/15/19 0340  NA 138  --  140 141 143 144  K 3.1*  --  3.3* 5.1 4.2 4.2  CL 91*  --  91* 98 98 96*  CO2 33*  --  36* 33* 34* 36*  GLUCOSE 165*  --  121* 125* 126* 136*  BUN 21  --  23 30* 43* 44*  CREATININE 1.03*  --  1.08* 1.11* 0.97 0.91  CALCIUM 9.5  --  9.6 8.9 8.9 8.7*  MG  --  2.8*  --   --   --   --    GFR Estimated Creatinine Clearance: 28.6 mL/min  (by C-G formula based on SCr of 0.91 mg/dL). Liver Function Tests: Recent Labs  Lab Dec 06, 2018 2029 08/12/19 0650 08/13/19 0340 08/14/19 0405 08/15/19 0340  AST 73* 33 27 22 21   ALT 31 28 27 23 23   ALKPHOS 84 69 69 66 62  BILITOT 1.2 0.6 0.7 0.9 0.8  PROT 6.6 5.4* 5.6* 5.7* 5.4*  ALBUMIN 3.2* 2.8* 3.1* 3.0* 3.0*   No results for input(s): LIPASE, AMYLASE in the last 168 hours. No results for input(s): AMMONIA in the last 168 hours. Coagulation profile No results for input(s): INR, PROTIME in the last 168 hours. COVID-19 Labs  Recent Labs    08/13/19 0340 08/14/19 0405 08/15/19 0340  DDIMER 2.37* 1.95* 1.98*  FERRITIN 257 213 199  CRP 1.6* 5.5* 2.9*    Lab Results  Component Value Date   [place] Not Detected 06/21/2019   SARSCOV2NAA NOT DETECTED 05/28/2019   [place] NEGATIVE 05/10/2019    CBC: Recent Labs  Lab 08/15/19 2029 08/12/19 0650 08/13/19 0340 08/14/19 0405 08/15/19 0340  WBC 13.3* 6.1 21.3* 12.3* 10.6*  NEUTROABS 11.9* 5.3 19.8* 11.1* 9.7*  HGB 11.2* 10.0* 10.7* 10.2* 9.9*  HCT 37.2 33.2* 36.2 34.4* 33.8*  MCV 103.6* 104.1* 106.5* 104.9* 106.3*  PLT 551* 340 488* 336 320   Cardiac Enzymes: No results for input(s): CKTOTAL, CKMB, CKMBINDEX, TROPONINI in the last 168 hours. BNP (last 3 results) No results for input(s): PROBNP in the last 8760 hours. CBG: Recent Labs  Lab 08/13/19 1701 08/13/19 1956 08/14/19 0738 08/14/19 1147 08/14/19 1653  GLUCAP 143* 125* 126* 183* 125*   D-Dimer: Recent Labs    08/14/19 0405 08/15/19 0340  DDIMER 1.95* 1.98*   Hgb A1c: No results for input(s): HGBA1C in the last 72 hours. Lipid Profile: No results for input(s): CHOL, HDL, LDLCALC, TRIG, CHOLHDL, LDLDIRECT in the last 72 hours. Thyroid function studies: No results for input(s): TSH, T4TOTAL, T3FREE, THYROIDAB in the last 72 hours.  Invalid input(s): FREET3 Anemia work up: Recent Labs    08/14/19 0405 08/15/19 0340  FERRITIN 213 199    Sepsis Labs: Recent Labs  Lab 08-15-2019 2243 08/12/19 0650 08/13/19 0340 08/14/19 0405 08/15/19 0340  PROCALCITON 0.41  --   --   --   --   WBC  --  6.1 21.3* 12.3* 10.6*  LATICACIDVEN 3.2*  --   --   --   --    Microbiology No results found for this or any previous visit (from the past 240 hour(s)).   Medications:   . albuterol  2 puff Inhalation Q6H  . amiodarone  100 mg Oral Q T,Th,S,Su  . amiodarone  200 mg Oral Q M,W,F  . vitamin C  500 mg Oral Daily  . clopidogrel  75 mg Oral Daily  . heparin injection (subcutaneous)  5,000 Units Subcutaneous Q8H  . insulin aspart  0-9 Units Subcutaneous TID WC  . levothyroxine  100 mcg Oral Q0600  . methylPREDNISolone (SOLU-MEDROL) injection  40 mg Intravenous Q12H  . pantoprazole  40 mg Oral BID  . pravastatin  20 mg Oral QPM  . zinc sulfate  220 mg Oral Daily   Continuous Infusions: . remdesivir 100 mg in NS 100 mL 100 mg (08/14/19 0859)      LOS: 4 days   Charlynne Cousins  [facility]  08/15/2019, 7:27 AM

## 2019-08-15 NOTE — Evaluation (Signed)
Clinical/Bedside Swallow Evaluation Patient Details  Name: Amy Chambers MRN: [id number] Date of Birth: 04/29/1928  Today's Date: 08/15/2019 Time: SLP Start Time (ACUTE ONLY): 1743 SLP Stop Time (ACUTE ONLY): 1800 SLP Time Calculation (min) (ACUTE ONLY): 17 min  Past Medical History:  Past Medical History:  Diagnosis Date  . Cataract   . COPD (chronic obstructive pulmonary disease) ([place])   . Dysphagia 05/31/2019  . GERD (gastroesophageal reflux disease)   . Hypertension   . Osteopenia   . Thyroid disease    Past Surgical History:  Past Surgical History:  Procedure Laterality Date  . CATARACT EXTRACTION, BILATERAL    . CORONARY STENT INTERVENTION N/A 05/14/2019   Procedure: CORONARY STENT INTERVENTION;  Surgeon: Troy Sine, MD;  Location: [place] [facility];  Service: Cardiovascular;  Laterality: N/A;  . LEFT HEART CATH AND CORONARY ANGIOGRAPHY N/A 05/14/2019   Procedure: LEFT HEART CATH AND CORONARY ANGIOGRAPHY;  Surgeon: Leonie Man, MD;  Location: [facility];  Service: Cardiovascular;  Laterality: N/A;  . LEFT HEART CATH AND CORONARY ANGIOGRAPHY N/A 05/14/2019   Procedure: LEFT HEART CATH AND CORONARY ANGIOGRAPHY;  Surgeon: Troy Sine, MD;  Location: [facility];  Service: Cardiovascular;  Laterality: N/A;  . MEDIASTINAL EXPLORATION N/A 05/14/2019   Procedure: Mediastinal Exploration with [place];  Surgeon: Lajuana Matte, MD;  Location: [facility];  Service: Open Heart Surgery;  Laterality: N/A;   HPI:  Amy Chambers is an 85 y.o. female past medical history reflux coronary artery disease, COPD, paroxysmal atrial fibrillation who presents to the emergency room on 2019/08/30 for shortness of breath. BSE 05/28/19 ordered due to regurgitation. Rec'd reg/thin and possible esophageal work up. MD documented pt coughing on thin.    Assessment / Plan / Recommendation Clinical Impression  Per RN pt has not eaten much. Diet texture is soft but she is  endentulous, reports she doesn't donn dentures to eat and eats mostly puree at home. RN reported pt has little appetite but hasn't observed her coughing with liquids multiple days he has taken care of her this week.. Multiple cup and straw sips consumed without s/s aspiration and Spo2 sats 95% and above on nasal cannula. She is deconditioned and downgrading texure to Dys 1 (puree), continue thin recommended this admission. No further ST needed.  SLP Visit Diagnosis: Dysphagia, oral phase (R13.11)    Aspiration Risk  Mild aspiration risk;Moderate aspiration risk    Diet Recommendation Thin liquid;Dysphagia 1 (Puree)   Liquid Administration via: Cup;Straw Medication Administration: Crushed with puree Supervision: Patient able to self feed;Intermittent supervision to cue for compensatory strategies Compensations: Slow rate;Small sips/bites Postural Changes: Seated upright at 90 degrees    Other  Recommendations Oral Care Recommendations: Oral care BID   Follow up Recommendations        Frequency and Duration            Prognosis        Swallow Study   General HPI: Amy Chambers is an 85 y.o. female past medical history reflux coronary artery disease, COPD, paroxysmal atrial fibrillation who presents to the emergency room on 08/30/19 for shortness of breath. BSE 05/28/19 ordered due to regurgitation. Rec'd reg/thin and possible esophageal work up. MD documented pt coughing on thin.  Type of Study: Bedside Swallow Evaluation Previous Swallow Assessment: (see HPI) Diet Prior to this Study: Thin liquids;Other (Comment)(SOFT) Temperature Spikes Noted: No Respiratory Status: Nasal cannula History of Recent Intubation: No Behavior/Cognition: Alert;Cooperative Oral Cavity  Assessment: Within Functional Limits Oral Care Completed by SLP: No Oral Cavity - Dentition: Edentulous Vision: Functional for self-feeding Self-Feeding Abilities: Able to feed self Patient Positioning: Upright in  bed Baseline Vocal Quality: Normal Volitional Cough: Weak Volitional Swallow: Able to elicit    Oral/Motor/Sensory Function Overall Oral Motor/Sensory Function: Within functional limits   Ice Chips Ice chips: Not tested   Thin Liquid Thin Liquid: Within functional limits Presentation: Cup;Straw    Nectar Thick Nectar Thick Liquid: Not tested   Honey Thick Honey Thick Liquid: Not tested   Puree Puree: Within functional limits   Solid     Solid: Not tested(eats soft food)      Amy Chambers 08/15/2019,6:33 PM

## 2019-08-15 NOTE — Progress Notes (Deleted)
[facility] PROGRESS NOTE    Progress Note  Amy Chambers  ZOX:[id number] DOB: 14-Feb-1928 DOA: 07/22/2019 PCP: [name], [name], [name]     Brief Narrative:   Amy Chambers M Chambers is an 85 y.o. female past medical history coronary artery disease status PCI on September 2020 complicated by hemopericardium, COPD, paroxysmal atrial fibrillation who presents to the emergency room on 08/02/2019 for shortness of breath per EMS she was satting 64% on room air.  Notes with COVID-19 in the hospital.  Assessment/Plan:   Acute respiratory failure with hypoxia secondarily to pneumonia due to COVID-19: She is still requiring 3 to 4 L to keep saturations greater than 92%. Continue IV steroids and IV remdesivir, her inflammatory markers continue to improve. Follow strict I's and O's and daily weights, try to keep the patient prone for 16 hours a day, if not prone out of bed to chair continue to use incentive spirometry. Patient seems to be a little bit less sleepy than yesterday see below for further details.  Acute metabolic encephalopathy: In the setting of dehydration and SARS-CoV-2 infection. She was hydrated and treated conservatively the patient seems to be perking up today. She is nonfocal on physical exam. Placed n.p.o., check a swallowing evaluation, the patient started coughing when provided water.  Hypokalemia: Replete orally now resolved.  Acute kidney injury: With a baseline creatinine of 0.7, on admission it was 1.1. Likely hemodynamically mediated and decreased oral intake, will start on IV fluid hydration recheck basic metabolic panel in the morning.  Prolonged QTC  Hypokalemia likely attributing to prolonged QTC. Magnesium was 2.8.  Chronic diastolic heart failure Appears to be euvolemic, she is mildly negative continue to follow strict I's and O's and daily weights replete potassium and monitor creatinine.    Hypothyroidism: Continue Synthroid.  CAD: Continue Plavix  she is chest pain-free.  PAF (paroxysmal atrial fibrillation) (HCC) He is currently on sinus rhythm, continue amiodarone she is not a candidate for anticoagulation due to recent GI bleed and hemopericardium.  Protein-calorie malnutrition, severe (HCC)  Debility/deconditioning: Likely chronic at baseline she appears to be frail at baseline. Physical therapy was consulted.  Palpable right groin mass: Get a CT scan of the abdomen and pelvis.  Ethics/palliative care/goals of care: Patient is a DNR both the patient and the family do not want aggressive care like intubation chest compressions. They want to continue current medical management with no further escalation of care.  And if she does not respond to treatment they were proceeds towards hospice care.   DVT prophylaxis: lovenox Family Communication:none Disposition Plan/Barrier to D/C: unable to determine Code Status:     Code Status Orders  (From admission, onward)         Start     Ordered   08/12/19 0525  Do not attempt resuscitation (DNR)  Continuous    Question Answer Comment  In the event of cardiac or respiratory ARREST Do not call a "code blue"   In the event of cardiac or respiratory ARREST Do not perform Intubation, CPR, defibrillation or ACLS   In the event of cardiac or respiratory ARREST Use medication by any route, position, wound care, and other measures to relive pain and suffering. May use oxygen, suction and manual treatment of airway obstruction as needed for comfort.      08/12/19 0524        Code Status History    Date Active Date Inactive Code Status Order ID Comments User Context   08/12/2019 0245 08/12/2019  0524 Full Code [id number]  Onnie Boer, MD Inpatient   05/28/2019 1559 05/31/2019 1843 DNR [id number]  Alba Cory, MD Inpatient   05/16/2019 1354 05/28/2019 1559 Partial Code [id number]  Luciano Cutter, MD Inpatient   05/11/2019 0624 05/16/2019 1354 Full Code [id number]   Macon Large, NP Inpatient   05/10/2019 2102 05/11/2019 0624 DNR [id number]  Levie Heritage, DO ED   Advance Care Planning Activity        IV Access:    Peripheral IV   Procedures and diagnostic studies:   No results found.   Medical Consultants:    None.  Anti-Infectives:   IV remdesivir  Subjective:    Amy Romans Hedger more awake she is complaining she is thirsty.  No further complaints.  Objective:    Vitals:   08/15/19 0346 08/15/19 0726 08/15/19 1128 08/15/19 1656  BP: 136/66 (!) 133/56 (!) 134/59 (!) 129/55  Pulse: 70 71 69 81  Resp: 17 18 18 16   Temp: 97.8 F (36.6 C) 97.9 F (36.6 C) 97.7 F (36.5 C) 97.8 F (36.6 C)  TempSrc: Oral Oral Oral Oral  SpO2: 99% 98% 97% 100%  Weight:      Height:       SpO2: 100 % O2 Flow Rate (L/min): 4 L/min   Intake/Output Summary (Last 24 hours) at 08/15/2019 1701 Last data filed at 08/15/2019 0954 Gross per 24 hour  Intake --  Output 850 ml  Net -850 ml   Filed Weights   07/22/2019 1943 08/12/19 0224  Weight: 52.6 kg 51.6 kg    Exam: General exam: In no acute distress. Respiratory system: Good air movement and clear to auscultation. Cardiovascular system: S1 & S2 heard, RRR. No JVD. Gastrointestinal system: Abdomen is nondistended, soft and nontender.  She has a prior 3 x 4 inch mass in the right groin area not tender, nonmobile and indurated. Central nervous system: Alert and oriented. No focal neurological deficits. Extremities: No pedal edema. Skin: No rashes, lesions or ulcers Psychiatry: [name] appears to be more awake today she is complaining of being significantly thirsty.   Data Reviewed:    Labs: Basic Metabolic Panel: Recent Labs  Lab 07/27/2019 2029 08/01/2019 2243 08/12/19 0650 08/13/19 0340 08/14/19 0405 08/15/19 0340  NA 138  --  140 141 143 144  K 3.1*  --  3.3* 5.1 4.2 4.2  CL 91*  --  91* 98 98 96*  CO2 33*  --  36* 33* 34* 36*  GLUCOSE 165*  --  121* 125* 126* 136*   BUN 21  --  23 30* 43* 44*  CREATININE 1.03*  --  1.08* 1.11* 0.97 0.91  CALCIUM 9.5  --  9.6 8.9 8.9 8.7*  MG  --  2.8*  --   --   --   --    GFR Estimated Creatinine Clearance: 28.6 mL/min (by C-G formula based on SCr of 0.91 mg/dL). Liver Function Tests: Recent Labs  Lab 08/10/2019 2029 08/12/19 0650 08/13/19 0340 08/14/19 0405 08/15/19 0340  AST 73* 33 27 22 21   ALT 31 28 27 23 23   ALKPHOS 84 69 69 66 62  BILITOT 1.2 0.6 0.7 0.9 0.8  PROT 6.6 5.4* 5.6* 5.7* 5.4*  ALBUMIN 3.2* 2.8* 3.1* 3.0* 3.0*   No results for input(s): LIPASE, AMYLASE in the last 168 hours. No results for input(s): AMMONIA in the last 168 hours. Coagulation profile No results for input(s): INR, PROTIME in the last 168 hours.  COVID-19 Labs  Recent Labs    08/13/19 0340 08/14/19 0405 08/15/19 0340  DDIMER 2.37* 1.95* 1.98*  FERRITIN 257 213 199  CRP 1.6* 5.5* 2.9*    Lab Results  Component Value Date   SARSCOV2NAA Not Detected 06/21/2019   SARSCOV2NAA NOT DETECTED 05/28/2019   [place] NEGATIVE 05/10/2019    CBC: Recent Labs  Lab 08/07/2019 2029 08/12/19 0650 08/13/19 0340 08/14/19 0405 08/15/19 0340  WBC 13.3* 6.1 21.3* 12.3* 10.6*  NEUTROABS 11.9* 5.3 19.8* 11.1* 9.7*  HGB 11.2* 10.0* 10.7* 10.2* 9.9*  HCT 37.2 33.2* 36.2 34.4* 33.8*  MCV 103.6* 104.1* 106.5* 104.9* 106.3*  PLT 551* 340 488* 336 320   Cardiac Enzymes: No results for input(s): CKTOTAL, CKMB, CKMBINDEX, TROPONINI in the last 168 hours. BNP (last 3 results) No results for input(s): PROBNP in the last 8760 hours. CBG: Recent Labs  Lab 08/14/19 1147 08/14/19 1653 08/15/19 0729 08/15/19 1130 08/15/19 1521  GLUCAP 183* 125* 120* 231* 240*   D-Dimer: Recent Labs    08/14/19 0405 08/15/19 0340  DDIMER 1.95* 1.98*   Hgb A1c: No results for input(s): HGBA1C in the last 72 hours. Lipid Profile: No results for input(s): CHOL, HDL, LDLCALC, TRIG, CHOLHDL, LDLDIRECT in the last 72 hours. Thyroid function  studies: No results for input(s): TSH, T4TOTAL, T3FREE, THYROIDAB in the last 72 hours.  Invalid input(s): FREET3 Anemia work up: Recent Labs    08/14/19 0405 08/15/19 0340  FERRITIN 213 199   Sepsis Labs: Recent Labs  Lab 08/02/2019 2243 08/12/19 0650 08/13/19 0340 08/14/19 0405 08/15/19 0340  PROCALCITON 0.41  --   --   --   --   WBC  --  6.1 21.3* 12.3* 10.6*  LATICACIDVEN 3.2*  --   --   --   --    Microbiology No results found for this or any previous visit (from the past 240 hour(s)).   Medications:   . albuterol  2 puff Inhalation Q6H  . amiodarone  100 mg Oral Q T,Th,S,Su  . amiodarone  200 mg Oral Q M,W,F  . vitamin C  500 mg Oral Daily  . clopidogrel  75 mg Oral Daily  . heparin injection (subcutaneous)  5,000 Units Subcutaneous Q8H  . insulin aspart  0-9 Units Subcutaneous TID WC  . insulin detemir  5 Units Subcutaneous Daily  . levothyroxine  100 mcg Oral Q0600  . methylPREDNISolone (SOLU-MEDROL) injection  40 mg Intravenous Q12H  . pantoprazole  40 mg Oral BID  . pravastatin  20 mg Oral QPM  . zinc sulfate  220 mg Oral Daily   Continuous Infusions: . sodium chloride 75 mL/hr at 08/15/19 0929  . remdesivir 100 mg in NS 100 mL 100 mg (08/15/19 0825)      LOS: 4 days   Charlynne Cousins  [facility]  08/15/2019, 5:01 PM

## 2019-08-16 LAB — CBC WITH DIFFERENTIAL/PLATELET
Abs Immature Granulocytes: 0.25 10*3/uL — ABNORMAL HIGH (ref 0.00–0.07)
Basophils Absolute: 0 10*3/uL (ref 0.0–0.1)
Basophils Relative: 0 %
Eosinophils Absolute: 0 10*3/uL (ref 0.0–0.5)
Eosinophils Relative: 0 %
HCT: 33.1 % — ABNORMAL LOW (ref 36.0–46.0)
Hemoglobin: 9.4 g/dL — ABNORMAL LOW (ref 12.0–15.0)
Immature Granulocytes: 3 %
Lymphocytes Relative: 3 %
Lymphs Abs: 0.3 10*3/uL — ABNORMAL LOW (ref 0.7–4.0)
MCH: 31.4 pg (ref 26.0–34.0)
MCHC: 28.4 g/dL — ABNORMAL LOW (ref 30.0–36.0)
MCV: 110.7 fL — ABNORMAL HIGH (ref 80.0–100.0)
Monocytes Absolute: 0.3 10*3/uL (ref 0.1–1.0)
Monocytes Relative: 4 %
Neutro Abs: 7.5 10*3/uL (ref 1.7–7.7)
Neutrophils Relative %: 90 %
Platelets: 241 10*3/uL (ref 150–400)
RBC: 2.99 MIL/uL — ABNORMAL LOW (ref 3.87–5.11)
RDW: 17 % — ABNORMAL HIGH (ref 11.5–15.5)
WBC: 8.3 10*3/uL (ref 4.0–10.5)
nRBC: 0 % (ref 0.0–0.2)

## 2019-08-16 LAB — COMPREHENSIVE METABOLIC PANEL
ALT: 22 U/L (ref 0–44)
AST: 21 U/L (ref 15–41)
Albumin: 2.5 g/dL — ABNORMAL LOW (ref 3.5–5.0)
Alkaline Phosphatase: 58 U/L (ref 38–126)
Anion gap: 6 (ref 5–15)
BUN: 40 mg/dL — ABNORMAL HIGH (ref 8–23)
CO2: 39 mmol/L — ABNORMAL HIGH (ref 22–32)
Calcium: 8.9 mg/dL (ref 8.9–10.3)
Chloride: 104 mmol/L (ref 98–111)
Creatinine, Ser: 0.78 mg/dL (ref 0.44–1.00)
GFR calc Af Amer: >60 mL/min (ref >60)
GFR calc non Af Amer: >60 mL/min (ref >60)
Glucose, Bld: 99 mg/dL (ref 70–99)
Potassium: 5 mmol/L (ref 3.5–5.1)
Sodium: 149 mmol/L — ABNORMAL HIGH (ref 135–145)
Total Bilirubin: 0.5 mg/dL (ref 0.3–1.2)
Total Protein: 4.8 g/dL — ABNORMAL LOW (ref 6.5–8.1)

## 2019-08-16 LAB — FERRITIN: Ferritin: 126 ng/mL (ref 11–307)

## 2019-08-16 LAB — C-REACTIVE PROTEIN: CRP: 1.2 mg/dL — ABNORMAL HIGH (ref <1.0)

## 2019-08-16 LAB — GLUCOSE, CAPILLARY
Glucose-Capillary: 92 mg/dL (ref 70–99)
Glucose-Capillary: 145 mg/dL — ABNORMAL HIGH (ref 70–99)
Glucose-Capillary: 177 mg/dL — ABNORMAL HIGH (ref 70–99)
Glucose-Capillary: 173 mg/dL — ABNORMAL HIGH (ref 70–99)

## 2019-08-16 LAB — D-DIMER, QUANTITATIVE: D-Dimer, Quant: 1.83 ug/mL-FEU — ABNORMAL HIGH (ref 0.00–0.50)

## 2019-08-16 MED ORDER — DEXTROSE 5 % IV SOLN: INTRAVENOUS | Status: DC

## 2019-08-16 MED ORDER — SODIUM CHLORIDE 0.9 % IV SOLN
100.0000 mg | Freq: Every day | INTRAVENOUS | Status: DC
  Administered 2019-08-17: 10:00:00 100 mg via INTRAVENOUS
  Filled 2019-08-16: qty 20

## 2019-08-16 NOTE — Progress Notes (Signed)
MD informed RN that pt was c/o RLE pain and is was more cool to touch than yesterday. Unable to palpate RLE pulses however, able to locate with doppler. MD stated to continue to monitor pulses with doppler and notify of any changes.

## 2019-08-16 NOTE — Progress Notes (Signed)
[facility] PROGRESS NOTE    Progress Note  Amy Chambers  NAT:[id number] DOB: 02-07-1928 DOA: 2019-08-26 PCP: Chevis Pretty, [name]     Brief Narrative:   Amy Chambers is an 86 y.o. female past medical history coronary artery disease status PCI on September 4270 complicated by hemopericardium, COPD, paroxysmal atrial fibrillation who presents to the emergency room on 08/26/19 for shortness of breath per EMS she was satting 64% on room air.  Notes with COVID-19 in the hospital.  Assessment/Plan:   Acute respiratory failure with hypoxia secondarily to pneumonia due to COVID-19: She is persistently requiring 3 to 4 L to keep saturations greater than 94%, try to wean to room air. Her inflammatory markers are still elevated, today will be her fifth day of IV remdesivir will continue for total of 10 days continue steroids for total of 10 days. Follow strict I's and O's and daily weights, try to keep the patient peripherally 16 hours a day if not prone out of bed to chair. Continue incentive spirometry.  Acute metabolic encephalopathy: In the setting of dehydration SARS-CoV-2, she was hydrated and treated comfortably, she seems to be perking up she is nonfocal on physical exam. She was placed n.p.o. with swallowing evaluation was done that showed high risk for aspiration. No won dysphaia 1 diet.  Hypokalemia: Replete orally now resolved.  Acute kidney injury: With a baseline creatinine of 0.7, on admission it was 1.1. Likely hemodynamically mediated improved with IV fluid hydration basic metabolic panel is pending this morning.  Prolonged QTC  Hypokalemia likely attributing to prolonged QTC.  Potassium was repleted. Magnesium was 2.8.  Chronic diastolic heart failure She appears euvolemic on physical exam continue strict I's and O's and daily weights.  Hypothyroidism: Continue Synthroid.  CAD: Continue Plavix she is chest pain-free.  PAF (paroxysmal atrial  fibrillation) ([place]) He is currently on sinus rhythm, continue amiodarone she is not a candidate for anticoagulation due to recent GI bleed and hemopericardium.  Protein-calorie malnutrition, severe (HCC)  Debility/deconditioning: Likely chronic at baseline she appears to be frail at baseline. Physical therapy was consulted  Pseudoaneurysm of the right groin: CT scan of the pelvis was done there is no significant aneurysm, the case was discussed gust with vascular surgery and the patient was not complaining of any pain, her leg is not cold she relates no pain in her right lower extremity sensation is intact passive movement of the leg does not cause any pain.  They recommended to get a Doppler ultrasound to evaluate which has been ordered and results are pending. I do not think the family would want surgical intervention on this pseudoaneurysm as previous conversations that we have had in the past, I will speak with the patient's family member about this.  Ethics/palliative care/goals of care: Patient is a DNR both the patient and the family do not want aggressive care like intubation chest compressions. They want to continue current medical management with no further escalation of care.  And if she does not respond to treatment they were proceeds towards hospice care.   DVT prophylaxis: lovenox Family Communication:none Disposition Plan/Barrier to D/C: unable to determine Code Status:     Code Status Orders  (From admission, onward)         Start     Ordered   08/12/19 0525  Do not attempt resuscitation (DNR)  Continuous    Question Answer Comment  In the event of cardiac or respiratory ARREST Do not call a "code blue"  In the event of cardiac or respiratory ARREST Do not perform Intubation, CPR, defibrillation or ACLS   In the event of cardiac or respiratory ARREST Use medication by any route, position, wound care, and other measures to relive pain and suffering. May use oxygen,  suction and manual treatment of airway obstruction as needed for comfort.      08/12/19 0524        Code Status History    Date Active Date Inactive Code Status Order ID Comments User Context   08/12/2019 0245 08/12/2019 0524 Full Code [id number]  Onnie Boer, MD Inpatient   05/28/2019 1559 05/31/2019 1843 DNR [id number]  Alba Cory, MD Inpatient   05/16/2019 1354 05/28/2019 1559 Partial Code [id number]  Luciano Cutter, MD Inpatient   05/11/2019 0624 05/16/2019 1354 Full Code [id number]  Macon Large, NP Inpatient   05/10/2019 2102 05/11/2019 0624 DNR [id number]  Levie Heritage, DO ED   Advance Care Planning Activity        IV Access:    Peripheral IV   Procedures and diagnostic studies:   CT ABDOMEN PELVIS W WO CONTRAST  Result Date: 08/15/2019 CLINICAL DATA:  Right groin mass. Inguinal hernia suspected. Early satiety. EXAM: CT ABDOMEN AND PELVIS WITHOUT AND WITH CONTRAST TECHNIQUE: Multidetector CT imaging of the abdomen and pelvis was performed following the standard protocol before and following the bolus administration of intravenous contrast. CONTRAST:  73mL OMNIPAQUE IOHEXOL 350 MG/ML SOLN COMPARISON:  05/14/2019 CTA FINDINGS: Lower chest: Right middle lobe bronchiectasis. Left lower lobe consolidation, incompletely imaged. Normal heart size with right coronary artery atherosclerosis. Median sternotomy. Trace left pleural fluid. Hepatobiliary: Motion degradation in the upper abdomen. Hepatic cysts. Grossly normal gallbladder, without biliary duct dilatation. Pancreas: Normal pancreas for age, without duct dilatation or acute inflammation. Spleen: Normal in size, without focal abnormality. Adrenals/Urinary Tract: Normal adrenal glands. Bilateral renal cysts and too small to characterize lesions. No hydronephrosis. Underdistended urinary bladder. Stomach/Bowel: Proximal gastric underdistention. A large periampullary duodenal diverticulum. Otherwise normal  small bowel. Extensive colonic diverticulosis. Normal terminal ileum and appendix. Vascular/Lymphatic: Advanced aortic and branch vessel atherosclerosis. Partially enhancing right groin collection off the femoral artery including at on the order of 7.9 x 4.9 cm on 73/2. 8.7 cm craniocaudal. The centrally enhancing portion measures 1.9 cm. No abdominopelvic adenopathy. Reproductive: Normal uterus and adnexa. Other: No significant free fluid.  Moderate pelvic floor laxity. Gas within the anterior abdominopelvic wall is likely iatrogenic. Musculoskeletal: Lumbosacral spondylosis. IMPRESSION: 1. Right-sided pseudoaneurysm off the femoral artery, accounting for the palpable abnormality. Consider relatively emergent vascular surgery consultation. 2. Left base consolidation with small left pleural effusion, suspicious for pneumonia. 3. Mild motion degradation. 4.  No acute process in the abdomen or pelvis. 5. Pelvic floor laxity. 6.  Aortic Atherosclerosis (ICD10-I70.0). 7. Proximal gastric underdistention. Given history of early satiety, wall thickening and other pathology in this area cannot be excluded. These results will be called to the ordering clinician or representative by the Radiologist Assistant, and communication documented in the PACS or zVision Dashboard. Electronically Signed   By: Jeronimo Greaves M.D.   On: 08/15/2019 17:13     Medical Consultants:    None.  Anti-Infectives:   IV remdesivir  Subjective:    Durene Romans Loud she relates her neck pain she denies, being cold or any leg pain.  Objective:    Vitals:   08/15/19 2000 08/15/19 2300 08/16/19 0000 08/16/19 0400  BP: (!) 115/53  (!) 116/55 (!) 102/46  Pulse: 73  70 62  Resp:  20  18  Temp: 98.1 F (36.7 C)   98.6 F (37 C)  TempSrc:      SpO2: 98%  100% 100%  Weight:      Height:       SpO2: 100 % O2 Flow Rate (L/min): 3 L/min   Intake/Output Summary (Last 24 hours) at 08/16/2019 0734 Last data filed at 08/16/2019  0400 Gross per 24 hour  Intake 1627.31 ml  Output 400 ml  Net 1227.31 ml   Filed Weights   07/28/2019 1943 08/12/19 0224  Weight: 52.6 kg 51.6 kg    Exam: General exam: In no acute distress. Respiratory system: Good air movement and diffuse crackles bilaterally Cardiovascular system: S1 & S2 heard, RRR. No JVD. Gastrointestinal system: Abdomen is nondistended, soft and nontender.  Central nervous system: Alert and oriented. No focal neurological deficits. Extremities: No pedal edema, she has barely palpable pulses on the right leg I Doppler them and there is a pulse present.  Passive movement does not cause his pain. Skin: No rashes, lesions or ulcers the right leg is warm Psychiatry: Judgement and insight appear normal. Mood & affect appropriate.   Data Reviewed:    Labs: Basic Metabolic Panel: Recent Labs  Lab 08/02/2019 2029 07/16/2019 2243 08/12/19 0650 08/13/19 0340 08/14/19 0405 08/15/19 0340  NA 138  --  140 141 143 144  K 3.1*  --  3.3* 5.1 4.2 4.2  CL 91*  --  91* 98 98 96*  CO2 33*  --  36* 33* 34* 36*  GLUCOSE 165*  --  121* 125* 126* 136*  BUN 21  --  23 30* 43* 44*  CREATININE 1.03*  --  1.08* 1.11* 0.97 0.91  CALCIUM 9.5  --  9.6 8.9 8.9 8.7*  MG  --  2.8*  --   --   --   --    GFR Estimated Creatinine Clearance: 28.6 mL/min (by C-G formula based on SCr of 0.91 mg/dL). Liver Function Tests: Recent Labs  Lab 08/14/2019 2029 08/12/19 0650 08/13/19 0340 08/14/19 0405 08/15/19 0340  AST 73* 33 27 22 21   ALT 31 28 27 23 23   ALKPHOS 84 69 69 66 62  BILITOT 1.2 0.6 0.7 0.9 0.8  PROT 6.6 5.4* 5.6* 5.7* 5.4*  ALBUMIN 3.2* 2.8* 3.1* 3.0* 3.0*   No results for input(s): LIPASE, AMYLASE in the last 168 hours. No results for input(s): AMMONIA in the last 168 hours. Coagulation profile No results for input(s): INR, PROTIME in the last 168 hours. COVID-19 Labs  Recent Labs    08/14/19 0405 08/15/19 0340  DDIMER 1.95* 1.98*  FERRITIN 213 199  CRP 5.5*  2.9*    Lab Results  Component Value Date   SARSCOV2NAA Not Detected 06/21/2019   SARSCOV2NAA NOT DETECTED 05/28/2019   SARSCOV2NAA NEGATIVE 05/10/2019    CBC: Recent Labs  Lab 08/14/2019 2029 08/12/19 0650 08/13/19 0340 08/14/19 0405 08/15/19 0340  WBC 13.3* 6.1 21.3* 12.3* 10.6*  NEUTROABS 11.9* 5.3 19.8* 11.1* 9.7*  HGB 11.2* 10.0* 10.7* 10.2* 9.9*  HCT 37.2 33.2* 36.2 34.4* 33.8*  MCV 103.6* 104.1* 106.5* 104.9* 106.3*  PLT 551* 340 488* 336 320   Cardiac Enzymes: No results for input(s): CKTOTAL, CKMB, CKMBINDEX, TROPONINI in the last 168 hours. BNP (last 3 results) No results for input(s): PROBNP in the last 8760 hours. CBG: Recent Labs  Lab 08/14/19 1653 08/15/19 0729 08/15/19 1130 08/15/19 1521 08/15/19 1954  GLUCAP 125* 120* 231* 240* 180*   D-Dimer: Recent Labs    08/14/19 0405 08/15/19 0340  DDIMER 1.95* 1.98*   Hgb A1c: No results for input(s): HGBA1C in the last 72 hours. Lipid Profile: No results for input(s): CHOL, HDL, LDLCALC, TRIG, CHOLHDL, LDLDIRECT in the last 72 hours. Thyroid function studies: No results for input(s): TSH, T4TOTAL, T3FREE, THYROIDAB in the last 72 hours.  Invalid input(s): FREET3 Anemia work up: Recent Labs    08/14/19 0405 08/15/19 0340  FERRITIN 213 199   Sepsis Labs: Recent Labs  Lab 07/28/2019 2243 08/12/19 0650 08/13/19 0340 08/14/19 0405 08/15/19 0340  PROCALCITON 0.41  --   --   --   --   WBC  --  6.1 21.3* 12.3* 10.6*  LATICACIDVEN 3.2*  --   --   --   --    Microbiology No results found for this or any previous visit (from the past 240 hour(s)).   Medications:   . albuterol  2 puff Inhalation Q6H  . amiodarone  100 mg Oral Q T,Th,S,Su  . amiodarone  200 mg Oral Q M,W,F  . vitamin C  500 mg Oral Daily  . clopidogrel  75 mg Oral Daily  . heparin injection (subcutaneous)  5,000 Units Subcutaneous Q8H  . insulin aspart  0-9 Units Subcutaneous TID WC  . insulin detemir  5 Units Subcutaneous  Daily  . levothyroxine  100 mcg Oral Q0600  . methylPREDNISolone (SOLU-MEDROL) injection  40 mg Intravenous Q12H  . pantoprazole  40 mg Oral BID  . pravastatin  20 mg Oral QPM  . zinc sulfate  220 mg Oral Daily   Continuous Infusions: . sodium chloride 75 mL/hr at 08/15/19 0929  . remdesivir 100 mg in NS 100 mL 100 mg (08/15/19 0825)      LOS: 5 days   Marinda Elk  [facility]  08/16/2019, 7:34 AM

## 2019-08-16 NOTE — NC FL2 (Signed)
[place] LEVEL OF CARE SCREENING TOOL     IDENTIFICATION  Patient Name: Amy Chambers Birthdate: 01/20/1928 Sex: female Admission Date (Current Location): 07/21/2019  [facility] and [place] Number:  Herbalist and Address:  [facility], [place] [address], [place], [place] [address]      Provider Number: [id number]  Attending Physician Name and Address:  Charlynne Cousins, MD  Relative Name and Phone Number:  Daughter: Rosalene Billings [phone]    Current Level of Care: Hospital Recommended Level of Care: [place] Prior Approval Number:    Date Approved/Denied:   PASRR Number: [id number]  Discharge Plan: SNF    Current Diagnoses: Patient Active Problem List   Diagnosis Date Noted  . Pneumonia due to COVID-19 virus 08/14/2019  . Hypokalemia 08/14/2019  . Chronic diastolic heart failure ([place]) 08/14/2019  . Acute tension-type headache 06/14/2019  . Chronic constipation 06/09/2019  . Protein-calorie malnutrition, severe ([place]) 06/09/2019  . Acute blood loss anemia, transfusesd 3 units PRBC 05/31/2019  . Acute combined systolic and diastolic HF (heart failure) ([place]) EF 45%  05/31/2019  . Anticoagulation adequate on Eliquis for PAF  05/31/2019  . Physical deconditioning due to acute illness 05/31/2019  . Dysphagia 05/31/2019  . DNR (do not resuscitate) 05/31/2019  . Sick sinus syndrome ([place])   . Acute respiratory failure with hypoxia ([place])   . COPD with acute exacerbation ([place])   . Status post coronary artery stent placement   . PAF (paroxysmal atrial fibrillation) ([place])   . 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 Pericardial tamponade   . CAD S/P percutaneous coronary angioplasty   . Acute respiratory failure with hypoxia and hypercapnia ([place]) 05/10/2019  . NSTEMI (non-ST elevated myocardial infarction) ([place]) 05/10/2019  . Simple chronic bronchitis ([place]) 05/24/2018  . HTN (hypertension)  12/01/2010  . Hyperlipemia 12/01/2010  . Hypothyroid 12/01/2010  . Osteopenia 12/01/2010  . GERD (gastroesophageal reflux disease) 12/01/2010    Orientation RESPIRATION BLADDER Height & Weight     Self, Situation, Place  Normal(weaning off 1L) Incontinent Weight: 51.6 kg Height:  4\' 11"  (149.9 cm)  BEHAVIORAL SYMPTOMS/MOOD NEUROLOGICAL BOWEL NUTRITION STATUS      Continent Diet  AMBULATORY STATUS COMMUNICATION OF NEEDS Skin   Extensive Assist Verbally Normal                       Personal Care Assistance Level of Assistance  Dressing, Bathing Bathing Assistance: Limited assistance   Dressing Assistance: Limited assistance     Functional Limitations Info             SPECIAL CARE FACTORS FREQUENCY  PT (By licensed PT), OT (By licensed OT)     PT Frequency: 5x/week OT Frequency: min 3x/week            Contractures Contractures Info: Not present    Additional Factors Info  Code Status, Allergies Code Status Info: DNR Allergies Info: Crestor, Niacin           Current Medications (08/16/2019):  This is the current hospital active medication list Current Facility-Administered Medications  Medication Dose Route Frequency Provider Last Rate Last Admin  . acetaminophen (TYLENOL) tablet 650 mg  650 mg Oral Q6H PRN Emokpae, Ejiroghene E, MD      . albuterol (VENTOLIN HFA) 108 (90 Base) MCG/ACT inhaler 2 puff  2 puff Inhalation Q6H Emokpae, Ejiroghene E, MD   2 puff at 08/16/19 0855  .  amiodarone (PACERONE) tablet 100 mg  100 mg Oral Q T,Th,S,Su Maretta Bees, MD   100 mg at 08/15/19 4128  . amiodarone (PACERONE) tablet 200 mg  200 mg Oral Q M,W,F Maretta Bees, MD   200 mg at 08/16/19 0902  . ascorbic acid (VITAMIN C) tablet 500 mg  500 mg Oral Daily Emokpae, Ejiroghene E, MD   500 mg at 08/16/19 0902  . clopidogrel (PLAVIX) tablet 75 mg  75 mg Oral Daily Maretta Bees, MD   75 mg at 08/16/19 0902  . guaiFENesin-dextromethorphan (ROBITUSSIN DM)  100-10 MG/5ML syrup 5 mL  5 mL Oral Q4H PRN Emokpae, Ejiroghene E, MD      . heparin injection 5,000 Units  5,000 Units Subcutaneous Q8H Maretta Bees, MD   5,000 Units at 08/16/19 0551  . ibuprofen (ADVIL) tablet 200 mg  200 mg Oral Q4H PRN Marinda Elk, MD      . insulin aspart (novoLOG) injection 0-9 Units  0-9 Units Subcutaneous TID WC Maretta Bees, MD   1 Units at 08/16/19 1233  . insulin detemir (LEVEMIR) injection 5 Units  5 Units Subcutaneous Daily Marinda Elk, MD   5 Units at 08/16/19 [phone]  . levothyroxine (SYNTHROID) tablet 100 mcg  100 mcg Oral Q0600 Maretta Bees, MD   100 mcg at 08/16/19 0552  . methylPREDNISolone sodium succinate (SOLU-MEDROL) 40 mg/mL injection 40 mg  40 mg Intravenous Q12H Emokpae, Ejiroghene E, MD   40 mg at 08/16/19 0856  . ondansetron (ZOFRAN) tablet 4 mg  4 mg Oral Q6H PRN Emokpae, Ejiroghene E, MD       Or  . ondansetron (ZOFRAN) injection 4 mg  4 mg Intravenous Q6H PRN Emokpae, Ejiroghene E, MD      . pantoprazole (PROTONIX) EC tablet 40 mg  40 mg Oral BID Maretta Bees, MD   40 mg at 08/16/19 0902  . polyethylene glycol (MIRALAX / GLYCOLAX) packet 17 g  17 g Oral Daily PRN Emokpae, Ejiroghene E, MD      . pravastatin (PRAVACHOL) tablet 20 mg  20 mg Oral QPM Ghimire, Werner Lean, MD   20 mg at 08/15/19 1744  . [START ON 08/25/2019] remdesivir 100 mg in sodium chloride 0.9 % 100 mL IVPB  100 mg Intravenous Daily Steenwyk, Yujing Z, RPH      . zinc sulfate capsule 220 mg  220 mg Oral Daily Emokpae, Ejiroghene E, MD   220 mg at 08/16/19 0902     Discharge Medications: Please see discharge summary for a list of discharge medications.  Relevant Imaging Results:  Relevant Lab Results:   Additional Information SSN: [id number]  Durenda Guthrie, RN

## 2019-08-16 DEATH — deceased

## 2019-08-17 ENCOUNTER — Inpatient Hospital Stay (HOSPITAL_COMMUNITY): Payer: Medicare Other

## 2019-08-17 DIAGNOSIS — I97610 Postprocedural hemorrhage and hematoma of a circulatory system organ or structure following a cardiac catheterization: Secondary | ICD-10-CM

## 2019-08-17 DIAGNOSIS — M79605 Pain in left leg: Secondary | ICD-10-CM

## 2019-08-17 DIAGNOSIS — I998 Other disorder of circulatory system: Secondary | ICD-10-CM

## 2019-08-17 LAB — COMPREHENSIVE METABOLIC PANEL
ALT: 32 U/L (ref 0–44)
AST: 28 U/L (ref 15–41)
Albumin: 3.1 g/dL — ABNORMAL LOW (ref 3.5–5.0)
Alkaline Phosphatase: 73 U/L (ref 38–126)
Anion gap: 11 (ref 5–15)
BUN: 47 mg/dL — ABNORMAL HIGH (ref 8–23)
CO2: 34 mmol/L — ABNORMAL HIGH (ref 22–32)
Calcium: 9.1 mg/dL (ref 8.9–10.3)
Chloride: 101 mmol/L (ref 98–111)
Creatinine, Ser: 1.2 mg/dL — ABNORMAL HIGH (ref 0.44–1.00)
GFR calc Af Amer: 46 mL/min — ABNORMAL LOW (ref >60)
GFR calc non Af Amer: 40 mL/min — ABNORMAL LOW (ref >60)
Glucose, Bld: 163 mg/dL — ABNORMAL HIGH (ref 70–99)
Potassium: 4.5 mmol/L (ref 3.5–5.1)
Sodium: 146 mmol/L — ABNORMAL HIGH (ref 135–145)
Total Bilirubin: 0.4 mg/dL (ref 0.3–1.2)
Total Protein: 5.9 g/dL — ABNORMAL LOW (ref 6.5–8.1)

## 2019-08-17 LAB — GLUCOSE, CAPILLARY: Glucose-Capillary: 173 mg/dL — ABNORMAL HIGH (ref 70–99)

## 2019-08-17 LAB — BRAIN NATRIURETIC PEPTIDE: B Natriuretic Peptide: 4261.7 pg/mL — ABNORMAL HIGH (ref 0.0–100.0)

## 2019-08-17 MED ORDER — MORPHINE 100MG IN NS 100ML (1MG/ML) PREMIX INFUSION
1.0000 mg/h | INTRAVENOUS | Status: DC
  Administered 2019-08-17: 11:00:00 1 mg/h via INTRAVENOUS
  Filled 2019-08-17 (×2): qty 100

## 2019-08-17 MED ORDER — NITROGLYCERIN 2 % TD OINT
0.5000 [in_us] | TOPICAL_OINTMENT | Freq: Four times a day (QID) | TRANSDERMAL | Status: AC
  Administered 2019-08-17: 06:00:00 0.5 [in_us] via TOPICAL
  Filled 2019-08-17: qty 30

## 2019-08-17 MED ORDER — MORPHINE SULFATE (PF) 2 MG/ML IV SOLN
3.0000 mg | Freq: Once | INTRAVENOUS | Status: AC
  Administered 2019-08-17: 11:00:00 3 mg via INTRAVENOUS
  Filled 2019-08-17: qty 2

## 2019-08-17 MED ORDER — FUROSEMIDE 10 MG/ML IJ SOLN
20.0000 mg | Freq: Once | INTRAMUSCULAR | Status: AC
  Administered 2019-08-17: 06:00:00 20 mg via INTRAVENOUS
  Filled 2019-08-17: qty 2

## 2019-08-17 MED ORDER — ACETYLCYSTEINE 20 % IN SOLN
2.0000 mL | Freq: Three times a day (TID) | RESPIRATORY_TRACT | Status: DC
  Filled 2019-08-17 (×4): qty 4

## 2019-09-04 ENCOUNTER — Telehealth: Payer: Medicare Other | Admitting: Cardiology

## 2019-09-16 NOTE — Progress Notes (Signed)
[facility] night shift.  The staff reported that the patient has been having increased oxygen requirement.  She is now on 15 L NRB.  The nursing staff assessment consistent with possible fluid overload.  Her fluids have been stopped.  Furosemide 20 mg IVP given along with half an inch of NTG to anterior chest wall.  Will check CBC, CMP and BNP.  Sanda Klein, MD.

## 2019-09-16 NOTE — Progress Notes (Signed)
Pt prepared and transported downstairs @ 2330 where Security received death certificate and tags, then escorted staff to place pt into morgue.

## 2019-09-16 NOTE — Progress Notes (Signed)
I spoke with the daughter Para March, she relates she has spoken to her siblings and they would like to move towards comfort care, they would like all medications that are not related to comfort stopped and all her lab to be discontinued. They would like to come and visit her mother to either respect. I offered them my condolences but they were very appreciative of the care we have provided here at the hospital.

## 2019-09-16 NOTE — Progress Notes (Addendum)
Pt requiring 15L NRB. MD notified

## 2019-09-16 NOTE — Progress Notes (Signed)
Right pseudoaneurysm evaluation has been completed. Preliminary results can be found in CV Proc through chart review.  Results were given to the patient's nurse, Kaitlyn.  09/01/2019 11:58 AM Olen Cordial RVT

## 2019-09-16 NOTE — Progress Notes (Signed)
Pt expired @ 2134. MD aware.

## 2019-09-16 NOTE — Progress Notes (Addendum)
[facility] PROGRESS NOTE    Progress Note  Amy Chambers  NWG:[id number] DOB: [birth year] DOA: 08/09/2019 PCP: Bennie Pierini, [name]     Brief Narrative:   Amy Chambers is an 86 y.o. female past medical history coronary artery disease status PCI on September 2020 complicated by hemopericardium, COPD, paroxysmal atrial fibrillation who presents to the emergency room on 08/10/2019 for shortness of breath per EMS she was satting 64% on room air.  Notes with COVID-19 in the hospital.  Assessment/Plan:   Acute respiratory failure with hypoxia secondarily to pneumonia due to COVID-19: She is currently on nonrebreather satting greater than 95%. Continue IV steroids, she has completed a course of IV remdesivir. Her inflammatory markers are significantly improved. Follow strict I's and O's Daily weights try to keep the patient peripherally 16 hours a day if not prone out of bed to chair. Overnight her oxygen requirements increased there was a concern for fluid overload, she was given IV Lasix overnight. Chest x-ray showed new complete the left lung white out, question mucous plugging we will start her on inhalers, will have to insist on flutter valve and chest physiotherapy, will ask respiratory therapy to do precautions.  Not a good candidate for bronchoscopy. Patient cannot work with respiratory therapist as she is extremely weak.  Acute metabolic encephalopathy: In the setting of dehydration SARS-CoV-2 infection now improved. On a dysphagia 1 diet.  Hypokalemia: Repleted orally now resolved. Her potassium this morning was 5 she was given Lasix overnight which probably improve her potassium.  New hypernatremia: Likely hypovolemic in the setting of Lasix use.  Acute kidney injury: With a baseline creatinine of less than 0.7 on admission 1.1 likely hemodynamically mediated now resolved with IV fluid hydration.  Prolonged QTC  Hypokalemia likely attributing to prolonged QTC.   Potassium was repleted. Magnesium was 2.8.  Chronic diastolic heart failure She appears euvolemic on physical exam continue strict I's and O's and daily weights.  Hypothyroidism: Continue Synthroid.  CAD: Continue Plavix she is chest pain-free.  PAF (paroxysmal atrial fibrillation) (HCC) He is currently on sinus rhythm, continue amiodarone she is not a candidate for anticoagulation due to recent GI bleed and hemopericardium.  Protein-calorie malnutrition, severe (HCC)  Debility/deconditioning: Likely chronic at baseline she appears to be frail at baseline. Physical therapy was consulted  Pseudoaneurysm of the right groin leading to left lower extremity pain due to ischemia: CT scan of the pelvis was done there is no significant aneurysm, the case was discussed gust with vascular surgery and the patient was not complaining of any pain, her leg is not cold she relates no pain in her right lower extremity sensation is intact passive movement of the leg does not cause any pain.  They recommended to get a Doppler ultrasound to evaluate which has been ordered and results are pending. I do not think the family would want surgical intervention on this pseudoaneurysm as previous conversations that we have had in the past, I will speak with the patient's family member about this.  Ethics/palliative care/goals of care: Patient is a DNR DNI, the family does not want aggressive therapy. After talking to the family about Soni worsening condition and poor prognosis that she has had over the last 36 hours, they are conveying a family meeting to get back to [place] with a decision.  I have explained to them that [name] now has a mucous plug which even with mucolytic physiotherapy she continues to require 15 L of oxygen, she has a  new left lower extremity with compromise blood flow to as she has pulses that are only felt by Doppler and the leg is cold, she relates a little bit of pain with passive movement and  she has new hypernatremia, she continues to eat less than 10% of her meals.  After explaining this to the family they decided to have a family meeting to try to move towards comfort care. I spent over 35 minutes speaking to the family about her poor prognosis   DVT prophylaxis: lovenox Family Communication:none Disposition Plan/Barrier to D/C: unable to determine Code Status:     Code Status Orders  (From admission, onward)         Start     Ordered   08/12/19 0525  Do not attempt resuscitation (DNR)  Continuous    Question Answer Comment  In the event of cardiac or respiratory ARREST Do not call a "code blue"   In the event of cardiac or respiratory ARREST Do not perform Intubation, CPR, defibrillation or ACLS   In the event of cardiac or respiratory ARREST Use medication by any route, position, wound care, and other measures to relive pain and suffering. May use oxygen, suction and manual treatment of airway obstruction as needed for comfort.      08/12/19 0524        Code Status History    Date Active Date Inactive Code Status Order ID Comments User Context   08/12/2019 0245 08/12/2019 0524 Full Code [id number]  Onnie Boer, MD Inpatient   05/28/2019 1559 05/31/2019 1843 DNR [id number]  Alba Cory, MD Inpatient   05/16/2019 1354 05/28/2019 1559 Partial Code [id number]  Luciano Cutter, MD Inpatient   05/11/2019 0624 05/16/2019 1354 Full Code [id number]  Macon Large, NP Inpatient   05/10/2019 2102 05/11/2019 0624 DNR [id number]  Levie Heritage, DO ED   Advance Care Planning Activity        IV Access:    Peripheral IV   Procedures and diagnostic studies:   CT ABDOMEN PELVIS W WO CONTRAST  Result Date: 08/15/2019 CLINICAL DATA:  Right groin mass. Inguinal hernia suspected. Early satiety. EXAM: CT ABDOMEN AND PELVIS WITHOUT AND WITH CONTRAST TECHNIQUE: Multidetector CT imaging of the abdomen and pelvis was performed following the standard  protocol before and following the bolus administration of intravenous contrast. CONTRAST:  83mL OMNIPAQUE IOHEXOL 350 MG/ML SOLN COMPARISON:  05/14/2019 CTA FINDINGS: Lower chest: Right middle lobe bronchiectasis. Left lower lobe consolidation, incompletely imaged. Normal heart size with right coronary artery atherosclerosis. Median sternotomy. Trace left pleural fluid. Hepatobiliary: Motion degradation in the upper abdomen. Hepatic cysts. Grossly normal gallbladder, without biliary duct dilatation. Pancreas: Normal pancreas for age, without duct dilatation or acute inflammation. Spleen: Normal in size, without focal abnormality. Adrenals/Urinary Tract: Normal adrenal glands. Bilateral renal cysts and too small to characterize lesions. No hydronephrosis. Underdistended urinary bladder. Stomach/Bowel: Proximal gastric underdistention. A large periampullary duodenal diverticulum. Otherwise normal small bowel. Extensive colonic diverticulosis. Normal terminal ileum and appendix. Vascular/Lymphatic: Advanced aortic and branch vessel atherosclerosis. Partially enhancing right groin collection off the femoral artery including at on the order of 7.9 x 4.9 cm on 73/2. 8.7 cm craniocaudal. The centrally enhancing portion measures 1.9 cm. No abdominopelvic adenopathy. Reproductive: Normal uterus and adnexa. Other: No significant free fluid.  Moderate pelvic floor laxity. Gas within the anterior abdominopelvic wall is likely iatrogenic. Musculoskeletal: Lumbosacral spondylosis. IMPRESSION: 1. Right-sided pseudoaneurysm off the femoral artery, accounting for the palpable abnormality. Consider relatively emergent  vascular surgery consultation. 2. Left base consolidation with small left pleural effusion, suspicious for pneumonia. 3. Mild motion degradation. 4.  No acute process in the abdomen or pelvis. 5. Pelvic floor laxity. 6.  Aortic Atherosclerosis (ICD10-I70.0). 7. Proximal gastric underdistention. Given history of early  satiety, wall thickening and other pathology in this area cannot be excluded. These results will be called to the ordering clinician or representative by the Radiologist Assistant, and communication documented in the PACS or zVision Dashboard. Electronically Signed   By: Jeronimo Greaves M.D.   On: 08/15/2019 17:13   DG CHEST PORT 1 VIEW  Result Date: 09/06/2019 CLINICAL DATA:  Hypoxia. EXAM: PORTABLE CHEST 1 VIEW COMPARISON:  Radiograph 08/12/2019. Lung bases from abdominopelvic CT 08/05/2019 FINDINGS: New volume loss in the left hemithorax with increased opacity. Leftward mediastinal shift. Post median sternotomy. Right lung is hyperinflated but clear. No pneumothorax. IMPRESSION: New volume loss in the left hemithorax with increased opacity throughout the left hemithorax. Findings have significantly progressed from recent chest radiographs and lung bases on abdominal CT, suggest mucous plugging/atelectasis or collapse. Consider bronchoscopy. Electronically Signed   By: Narda Rutherford M.D.   On: 08/21/2019 06:33     Medical Consultants:    None.  Anti-Infectives:   IV remdesivir  Subjective:    Durene Romans [place] she relates some left lower extremity pain, she relate her back hurts and that she has difficulty breathing.  Objective:    Vitals:   08/16/19 1516 08/16/19 1900 08/22/2019 0400 08/26/2019 0427  BP: (!) 146/79 (!) 143/75 (!) 167/79   Pulse: 74 75 93   Resp: 20 (!) 22 (!) 26 (!) 22  Temp: 97.8 F (36.6 C) 97.9 F (36.6 C) 98 F (36.7 C)   TempSrc: Oral     SpO2: 94% 93% 96%   Weight:      Height:       SpO2: 96 % O2 Flow Rate (L/min): 1 L/min   Intake/Output Summary (Last 24 hours) at 08/25/2019 0724 Last data filed at 08/16/2019 0400 Gross per 24 hour  Intake 1278.41 ml  Output 200 ml  Net 1078.41 ml   Filed Weights   September 09, 2019 1943 08/12/19 0224  Weight: 52.6 kg 51.6 kg    Exam: General exam: In no acute distress. Respiratory system: Good air movement and cannot  appreciate breathing sounds on the left lung Cardiovascular system: S1 & S2 heard, RRR. No JVD. Gastrointestinal system: Abdomen is nondistended, soft and nontender.  Central nervous system: Alert and oriented. No focal neurological deficits. Extremities: The left lower extremity extremely cold compared to the right, she has a little bit of pain with passive movement of the calf, the leg is not mottled. Skin: No rashes, lesions or ulcers Psychiatry: Judgement and insight appear normal. Mood & affect appropriate.    Data Reviewed:    Labs: Basic Metabolic Panel: Recent Labs  Lab 2019-09-09 2243 08/12/19 0650 08/13/19 0340 08/14/19 0405 08/15/19 0340 08/16/19 0658  NA  --  140 141 143 144 149*  K  --  3.3* 5.1 4.2 4.2 5.0  CL  --  91* 98 98 96* 104  CO2  --  36* 33* 34* 36* 39*  GLUCOSE  --  121* 125* 126* 136* 99  BUN  --  23 30* 43* 44* 40*  CREATININE  --  1.08* 1.11* 0.97 0.91 0.78  CALCIUM  --  9.6 8.9 8.9 8.7* 8.9  MG 2.8*  --   --   --   --   --  GFR Estimated Creatinine Clearance: 32.5 mL/min (by C-G formula based on SCr of 0.78 mg/dL). Liver Function Tests: Recent Labs  Lab 08/12/19 0650 08/13/19 0340 08/14/19 0405 08/15/19 0340 08/16/19 0658  AST 33 27 22 21 21   ALT 28 27 23 23 22   ALKPHOS 69 69 66 62 58  BILITOT 0.6 0.7 0.9 0.8 0.5  PROT 5.4* 5.6* 5.7* 5.4* 4.8*  ALBUMIN 2.8* 3.1* 3.0* 3.0* 2.5*   No results for input(s): LIPASE, AMYLASE in the last 168 hours. No results for input(s): AMMONIA in the last 168 hours. Coagulation profile No results for input(s): INR, PROTIME in the last 168 hours. COVID-19 Labs  Recent Labs    08/15/19 0340 08/16/19 0658  DDIMER 1.98* 1.83*  FERRITIN 199 126  CRP 2.9* 1.2*    Lab Results  Component Value Date   SARSCOV2NAA Not Detected 06/21/2019   SARSCOV2NAA NOT DETECTED 05/28/2019   [place] NEGATIVE 05/10/2019    CBC: Recent Labs  Lab 08/12/19 0650 08/13/19 0340 08/14/19 0405 08/15/19 0340  08/16/19 0658  WBC 6.1 21.3* 12.3* 10.6* 8.3  NEUTROABS 5.3 19.8* 11.1* 9.7* 7.5  HGB 10.0* 10.7* 10.2* 9.9* 9.4*  HCT 33.2* 36.2 34.4* 33.8* 33.1*  MCV 104.1* 106.5* 104.9* 106.3* 110.7*  PLT 340 488* 336 320 241   Cardiac Enzymes: No results for input(s): CKTOTAL, CKMB, CKMBINDEX, TROPONINI in the last 168 hours. BNP (last 3 results) No results for input(s): PROBNP in the last 8760 hours. CBG: Recent Labs  Lab 08/15/19 1954 08/16/19 0745 08/16/19 1119 08/16/19 1721 08/16/19 2203  GLUCAP 180* 92 145* 177* 173*   D-Dimer: Recent Labs    08/15/19 0340 08/16/19 0658  DDIMER 1.98* 1.83*   Hgb A1c: No results for input(s): HGBA1C in the last 72 hours. Lipid Profile: No results for input(s): CHOL, HDL, LDLCALC, TRIG, CHOLHDL, LDLDIRECT in the last 72 hours. Thyroid function studies: No results for input(s): TSH, T4TOTAL, T3FREE, THYROIDAB in the last 72 hours.  Invalid input(s): FREET3 Anemia work up: Recent Labs    08/15/19 0340 08/16/19 0658  FERRITIN 199 126   Sepsis Labs: Recent Labs  Lab 07/20/2019 2243 08/13/19 0340 08/14/19 0405 08/15/19 0340 08/16/19 0658  PROCALCITON 0.41  --   --   --   --   WBC  --  21.3* 12.3* 10.6* 8.3  LATICACIDVEN 3.2*  --   --   --   --    Microbiology No results found for this or any previous visit (from the past 240 hour(s)).   Medications:   . albuterol  2 puff Inhalation Q6H  . amiodarone  100 mg Oral Q T,Th,S,Su  . amiodarone  200 mg Oral Q M,W,F  . vitamin C  500 mg Oral Daily  . clopidogrel  75 mg Oral Daily  . heparin injection (subcutaneous)  5,000 Units Subcutaneous Q8H  . insulin aspart  0-9 Units Subcutaneous TID WC  . insulin detemir  5 Units Subcutaneous Daily  . levothyroxine  100 mcg Oral Q0600  . methylPREDNISolone (SOLU-MEDROL) injection  40 mg Intravenous Q12H  . pantoprazole  40 mg Oral BID  . pravastatin  20 mg Oral QPM  . zinc sulfate  220 mg Oral Daily   Continuous Infusions: . remdesivir  100 mg in NS 100 mL        LOS: 6 days   Charlynne Cousins  [facility]  09-07-2019, 7:24 AM

## 2019-09-16 NOTE — Discharge Summary (Signed)
Death Summary  Amy Chambers YHC:[id number] DOB: [birth year] DOA: 26-Aug-2019  PCP: Bennie Pierini, [name]  Admit date: 2019/08/26 Date of Death: 09/04/2019 Time of Death: 2132/12/09 Notification: Bennie Pierini, [name] notified of death of 09/13/19   History of present illness:  86 y.o. female past medical history coronary artery disease status PCI on September 2020 complicated by hemopericardium, COPD, paroxysmal atrial fibrillation who presents to the emergency room on 08/26/19 for shortness of breath per EMS she was satting 64% on room air.    She was hospitalized for pneumonia due to COVID-19 in the hospital.  She is started empirically on IV remdesivir and steroids, but she continued to desaturate requiring more oxygen.  After long discussion with the family due to her poor oral intake and increased oxygen requirements decided to move towards comfort care.   Final Diagnoses:  Acute respiratory failure with hypoxia secondary to COVID-19 pneumonia Acute metabolic encephalopathy Hypokalemia Hyponatremia Acute kidney injury Prolonged QTC Chronic diastolic heart failure Hypothyroidism Paroxysmal atrial fibrillation Severe protein caloric malnutrition     The results of significant diagnostics from this hospitalization (including imaging, microbiology, ancillary and laboratory) are listed below for reference.    Significant Diagnostic Studies: CT ABDOMEN PELVIS W WO CONTRAST  Result Date: 08/15/2019 CLINICAL DATA:  Right groin mass. Inguinal hernia suspected. Early satiety. EXAM: CT ABDOMEN AND PELVIS WITHOUT AND WITH CONTRAST TECHNIQUE: Multidetector CT imaging of the abdomen and pelvis was performed following the standard protocol before and following the bolus administration of intravenous contrast. CONTRAST:  5mL OMNIPAQUE IOHEXOL 350 MG/ML SOLN COMPARISON:  05/14/2019 CTA FINDINGS: Lower chest: Right middle lobe bronchiectasis. Left lower lobe consolidation, incompletely  imaged. Normal heart size with right coronary artery atherosclerosis. Median sternotomy. Trace left pleural fluid. Hepatobiliary: Motion degradation in the upper abdomen. Hepatic cysts. Grossly normal gallbladder, without biliary duct dilatation. Pancreas: Normal pancreas for age, without duct dilatation or acute inflammation. Spleen: Normal in size, without focal abnormality. Adrenals/Urinary Tract: Normal adrenal glands. Bilateral renal cysts and too small to characterize lesions. No hydronephrosis. Underdistended urinary bladder. Stomach/Bowel: Proximal gastric underdistention. A large periampullary duodenal diverticulum. Otherwise normal small bowel. Extensive colonic diverticulosis. Normal terminal ileum and appendix. Vascular/Lymphatic: Advanced aortic and branch vessel atherosclerosis. Partially enhancing right groin collection off the femoral artery including at on the order of 7.9 x 4.9 cm on 73/2. 8.7 cm craniocaudal. The centrally enhancing portion measures 1.9 cm. No abdominopelvic adenopathy. Reproductive: Normal uterus and adnexa. Other: No significant free fluid.  Moderate pelvic floor laxity. Gas within the anterior abdominopelvic wall is likely iatrogenic. Musculoskeletal: Lumbosacral spondylosis. IMPRESSION: 1. Right-sided pseudoaneurysm off the femoral artery, accounting for the palpable abnormality. Consider relatively emergent vascular surgery consultation. 2. Left base consolidation with small left pleural effusion, suspicious for pneumonia. 3. Mild motion degradation. 4.  No acute process in the abdomen or pelvis. 5. Pelvic floor laxity. 6.  Aortic Atherosclerosis (ICD10-I70.0). 7. Proximal gastric underdistention. Given history of early satiety, wall thickening and other pathology in this area cannot be excluded. These results will be called to the ordering clinician or representative by the Radiologist Assistant, and communication documented in the PACS or zVision Dashboard. Electronically  Signed   By: Jeronimo Greaves M.D.   On: 08/15/2019 17:13   Cardiac event monitor  Result Date: 08/21/2019  14 day event monitor  Min HR 56, Max HR 184, Avg HR 72  Rare supraventricualr ectopy  Rare ventricular ectopy  Telemetry tracings show sinus rhythm with episodes of atrial fibrillation with RVR  up to 140 bpm    DG CHEST PORT 1 VIEW  Result Date: 09-05-2019 CLINICAL DATA:  Hypoxia. EXAM: PORTABLE CHEST 1 VIEW COMPARISON:  Radiograph 08/12/2019. Lung bases from abdominopelvic CT 08/05/2019 FINDINGS: New volume loss in the left hemithorax with increased opacity. Leftward mediastinal shift. Post median sternotomy. Right lung is hyperinflated but clear. No pneumothorax. IMPRESSION: New volume loss in the left hemithorax with increased opacity throughout the left hemithorax. Findings have significantly progressed from recent chest radiographs and lung bases on abdominal CT, suggest mucous plugging/atelectasis or collapse. Consider bronchoscopy. Electronically Signed   By: Keith Rake M.D.   On: 09/05/2019 06:33   DG Chest Portable 1 View  Result Date: 07/23/2019 CLINICAL DATA:  Shortness of breath EXAM: PORTABLE CHEST 1 VIEW COMPARISON:  Radiograph 06/14/2019, CT 05/14/2019 FINDINGS: Lungs appear hyperexpanded, similar to comparison. Stable bronchiectatic changes again seen in the right lower lobe. No consolidation, features of edema, pneumothorax, or effusion. The aorta is calcified. The remaining cardiomediastinal contours are unremarkable. Postsurgical changes related to prior CABG including intact and aligned sternotomy wires and multiple surgical clips projecting over the mediastinum. No acute osseous or soft tissue abnormality. Degenerative changes are present in the imaged spine and shoulders. IMPRESSION: 1. No acute cardiopulmonary abnormality 2. Stable hyperinflation and bronchiectatic changes in the right lower lobe. 3. Stable postoperative changes related to prior CABG. 4. Aortic  atherosclerosis. Electronically Signed   By: Lovena Le M.D.   On: 07/20/2019 20:24   DG Chest Port 1V today  Result Date: 08/12/2019 CLINICAL DATA:  Shortness of breath EXAM: PORTABLE CHEST 1 VIEW COMPARISON:  07/25/2019 FINDINGS: Background changes of COPD. Lung aeration is similar with slightly increased retrocardiac density probably reflecting atelectasis. No significant pleural effusion. No pneumothorax. Stable cardiomediastinal contours. IMPRESSION: Slightly increased retrocardiac density probably reflecting atelectasis. Otherwise unchanged. Electronically Signed   By: Macy Mis M.D.   On: 08/12/2019 08:20   VAS [place] GROIN PSEUDOANEURYSM  Result Date: 09/05/2019  ARTERIAL PSEUDOANEURYSM  Exam: Right groin Indications: Patient complains of palpable knot. Comparison Study: No prior studies. Performing Technologist: Oliver Hum RVT  Examination Guidelines: A complete evaluation includes B-mode imaging, spectral Doppler, color Doppler, and power Doppler as needed of all accessible portions of each vessel. Bilateral testing is considered an integral part of a complete examination. Limited examinations for reoccurring indications may be performed as noted. +------------+----------+--------+------+----------+ Right DuplexPSV (cm/s)WaveformPlaqueComment(s) +------------+----------+--------+------+----------+ CFA                   biphasic                 +------------+----------+--------+------+----------+ Prox SFA              biphasic                 +------------+----------+--------+------+----------+  Findings: Multiple areas with well defined borders. The active measures 3.5 cm x 4.0 cm was visualized arising off of the CFA with ultrasound characteristics of a pseudoaneurysm. Mixed echos within the structure suggest that it is partially thrombosed with a residual diameter of 0.2 cm x 0.2 cm. The neck measures approximately 0.3 cm wide and 0.2 cm long.  Diagnosing physician:  Deitra Mayo MD Electronically signed by Deitra Mayo MD on 09/05/2019 at 3:45:40 PM.   --------------------------------------------------------------------------------    Final     Microbiology: No results found for this or any previous visit (from the past 240 hour(s)).   Labs: Basic Metabolic Panel: No results for input(s): NA, K, CL,  CO2, GLUCOSE, BUN, CREATININE, CALCIUM, MG, PHOS in the last 168 hours. Liver Function Tests: No results for input(s): AST, ALT, ALKPHOS, BILITOT, PROT, ALBUMIN in the last 168 hours. No results for input(s): LIPASE, AMYLASE in the last 168 hours. No results for input(s): AMMONIA in the last 168 hours. CBC: No results for input(s): WBC, NEUTROABS, HGB, HCT, MCV, PLT in the last 168 hours. Cardiac Enzymes: No results for input(s): CKTOTAL, CKMB, CKMBINDEX, TROPONINI in the last 168 hours. D-Dimer No results for input(s): DDIMER in the last 72 hours. BNP: Invalid input(s): POCBNP CBG: No results for input(s): GLUCAP in the last 168 hours. Anemia work up No results for input(s): VITAMINB12, FOLATE, FERRITIN, TIBC, IRON, RETICCTPCT in the last 72 hours. Urinalysis    Component Value Date/Time   COLORURINE YELLOW 05/30/2019 1856   APPEARANCEUR Cloudy (A) 08/01/2019 1443   LABSPEC 1.012 05/30/2019 1856   PHURINE 6.0 05/30/2019 1856   GLUCOSEU Negative 08/01/2019 1443   HGBUR MODERATE (A) 05/30/2019 1856   BILIRUBINUR Negative 08/01/2019 1443   KETONESUR NEGATIVE 05/30/2019 1856   PROTEINUR 1+ (A) 08/01/2019 1443   PROTEINUR NEGATIVE 05/30/2019 1856   UROBILINOGEN negative 01/05/2015 1225   NITRITE Positive (A) 08/01/2019 1443   NITRITE NEGATIVE 05/30/2019 1856   LEUKOCYTESUR Trace (A) 08/01/2019 1443   LEUKOCYTESUR NEGATIVE 05/30/2019 1856   Sepsis Labs Invalid input(s): PROCALCITONIN,  WBC,  LACTICIDVEN     SIGNED:  Marinda Elk, MD  [facility] 08/29/2019, 1:01 PM Pager   If 7PM-7AM, please contact  night-coverage [email or web address] Password [email or web address] .

## 2019-09-16 NOTE — Progress Notes (Signed)
83 mL Morphine wasted with Arelia Sneddon, RN @ 2300 in [place].

## 2019-09-16 DEATH — deceased

## 2021-07-06 IMAGING — MR MR CERVICAL SPINE W/O CM
4 of 5 series · 28 of 48 positions shown · non-contrast
Comparison: Neck CT 05/10/2019

CLINICAL DATA: Headache, neck pain. Unable to ambulate. Weakness.

EXAM:
MRI CERVICAL SPINE WITHOUT CONTRAST
TECHNIQUE: Multiplanar, multisequence MR imaging of the cervical spine was
performed. No intravenous contrast was administered.

[Series 3: T2 · sagittal · 3.0mm · 0.79mm/px · 6 of 15 slices shown (1 of 2)]
[im 1/15]
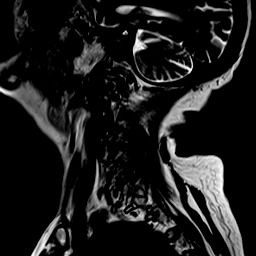
[im 3/15]
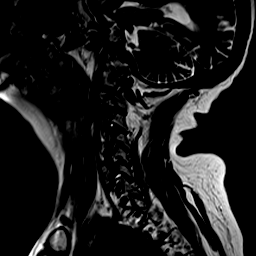
[im 6/15]
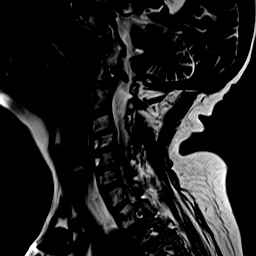
[im 9/15]
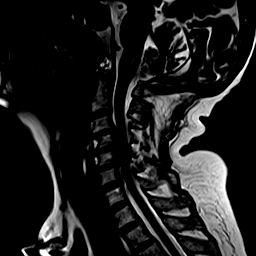
[im 12/15]
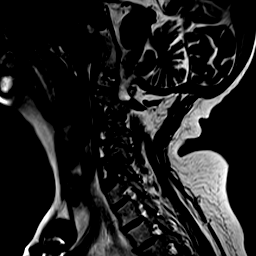
[im 15/15]
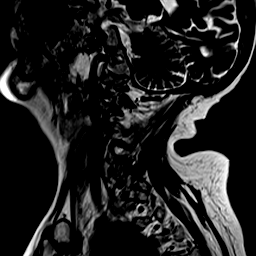

[Series 4: FLAIR · sagittal · 3.0mm · 0.79mm/px · 7 of 15 slices shown]
[im 1/15]
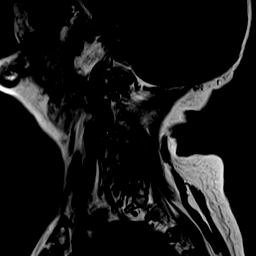
[im 3/15]
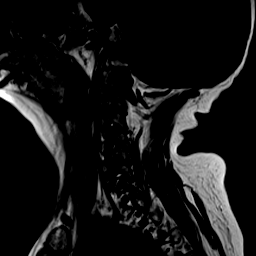
[im 5/15]
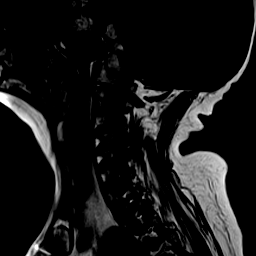
[im 8/15]
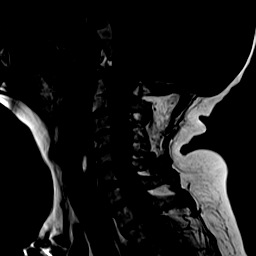
[im 10/15]
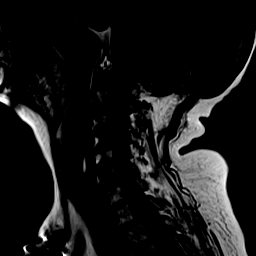
[im 12/15]
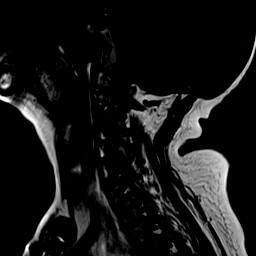
[im 15/15]
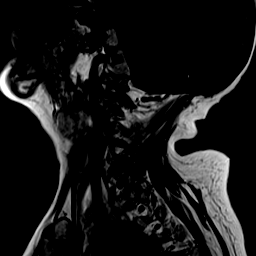

[Series 5: STIR · sagittal · 3.0mm · 0.39mm/px · 7 of 15 slices shown]
[im 1/15]
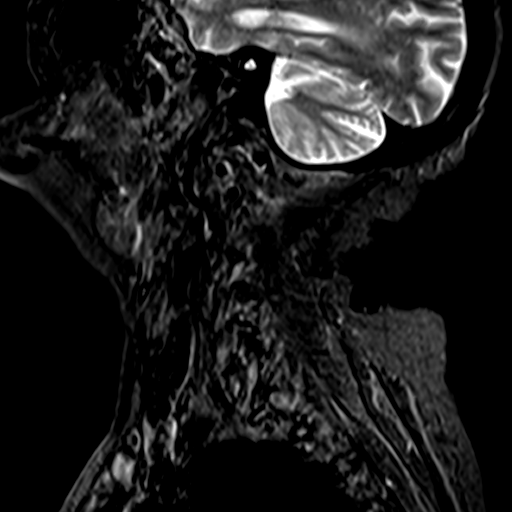
[im 3/15]
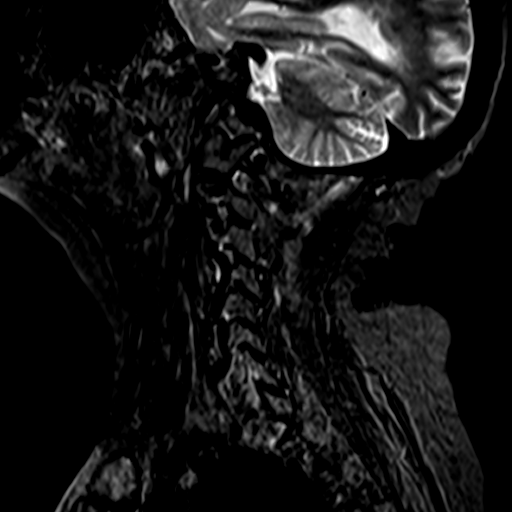
[im 5/15]
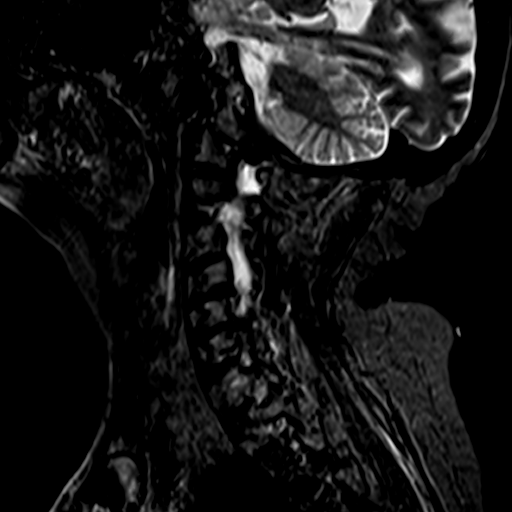
[im 8/15]
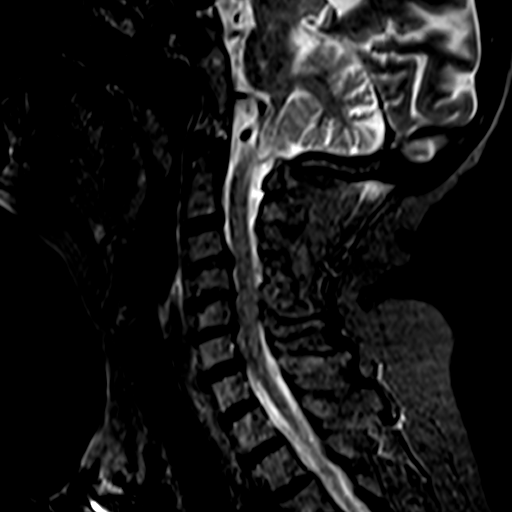
[im 10/15]
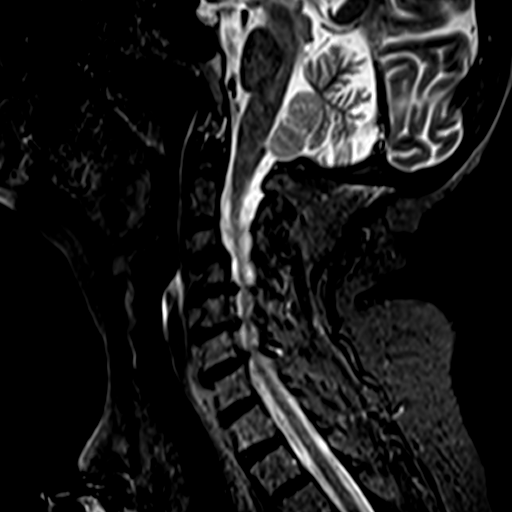
[im 12/15]
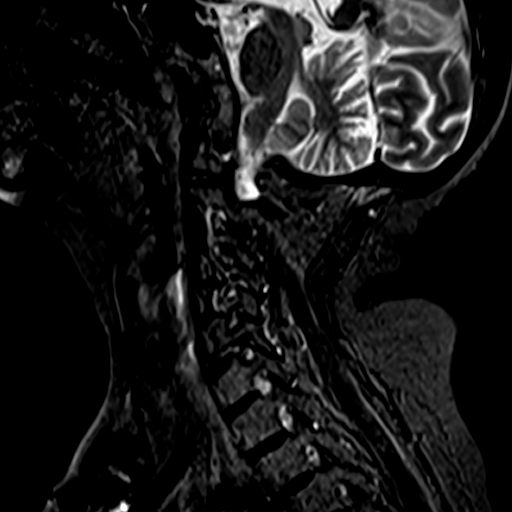
[im 15/15]
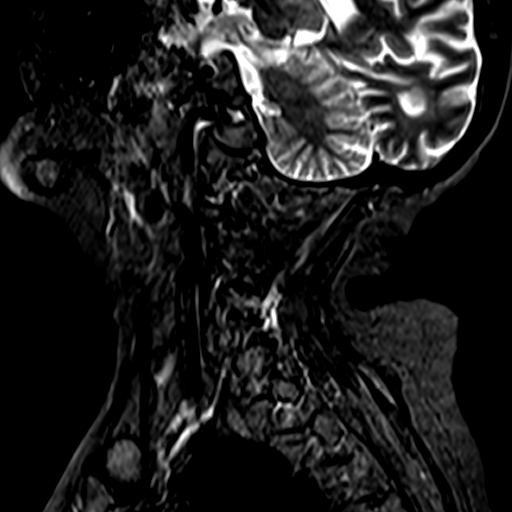

[Series 6: T2 · axial · 3.0mm · 0.25mm/px · z∈[-43,+51]mm · 8 of 30 slices shown (2 of 2)]
[im 1/30]
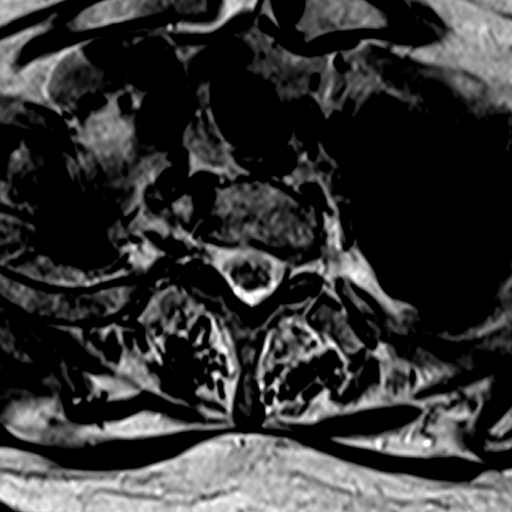
[im 5/30]
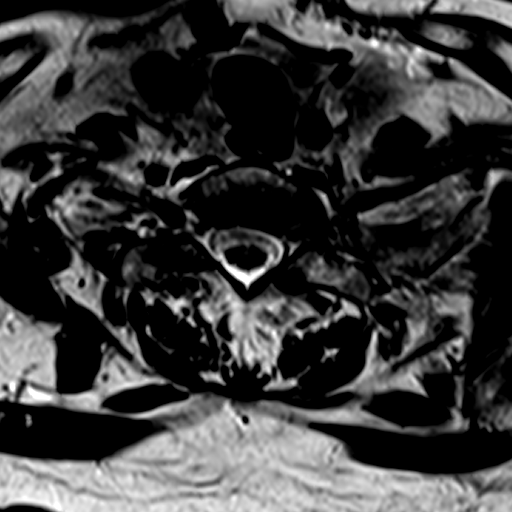
[im 9/30]
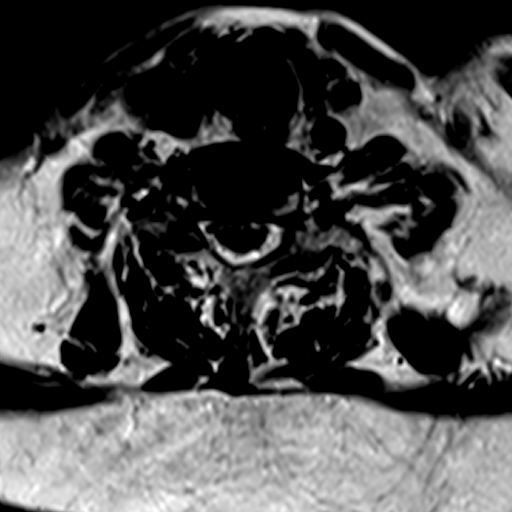
[im 14/30]
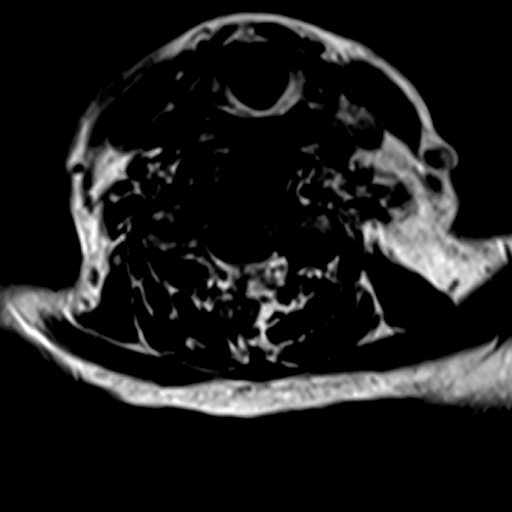
[im 16/30]
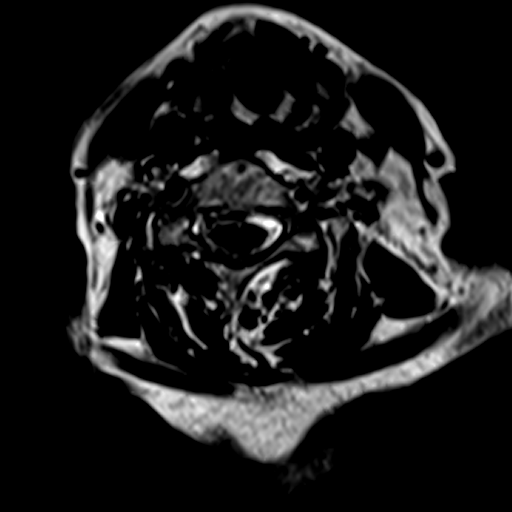
[im 21/30]
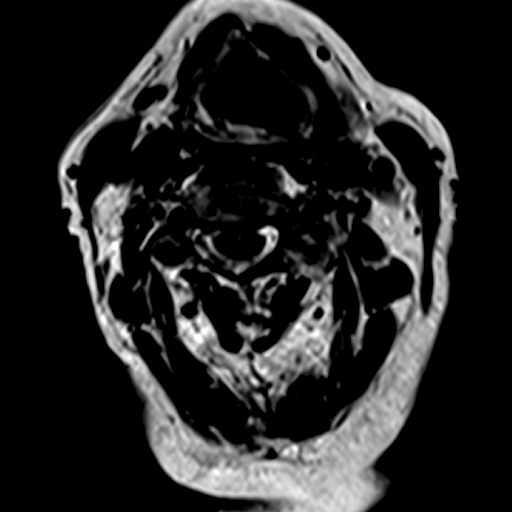
[im 25/30]
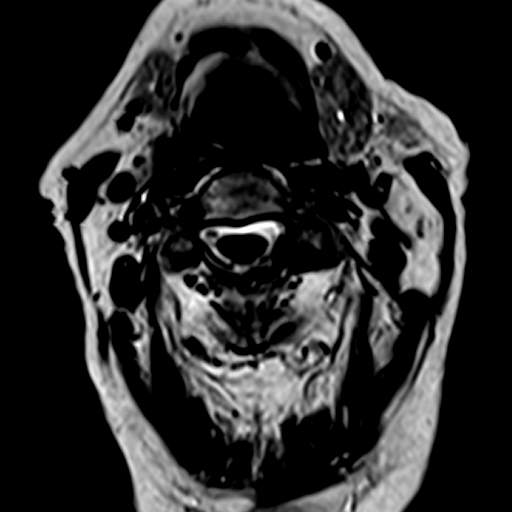
[im 30/30]
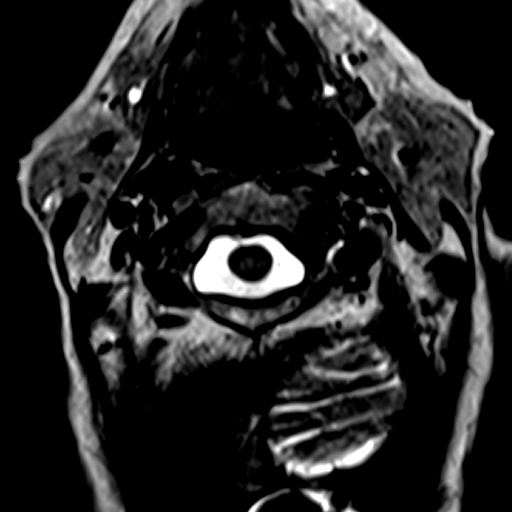

[28 of 48 positions shown; findings below may reference images not displayed]

FINDINGS: Alignment: Normal overall alignment. There is mild degenerative
posterior subluxation of C4 and C5.

Vertebrae: Normal marrow signal. No bone lesions or fractures.

Cord: Normal cord signal intensity. No cord lesions or syrinx.

Posterior Fossa, vertebral arteries, paraspinal tissues: No
significant findings.

Disc levels:

C2-3: No significant findings.

C3-4: Degenerated bulging and uncovered disc along with osteophytic
ridging and uncinate spurring. There is flattening of the ventral
thecal sac and narrowing the ventral CSF space. Mild left foraminal
stenosis mainly due to asymmetric left-sided facet disease.

C4-5: Bulging uncovered disc, osteophytic ridging and uncinate
spurring. Flattening of the ventral thecal sac and narrowing the
ventral CSF space. No significant foraminal stenosis.

C5-6: Bulging slightly uncovered disc with osteophytic ridging and
uncinate spurring. There is flattening of the ventral thecal sac and
narrowing the ventral CSF space. Right-sided disc osteophyte complex
with moderate right foraminal stenosis.

C6-7: Mild diffuse annular bulge with slight flattening of the
ventral thecal sac. Mild left foraminal encroachment but no
significant foraminal stenosis.

C7-T1: No significant findings.
IMPRESSION: 1. Degenerative cervical spondylosis with multilevel disc disease
and facet disease.
2. Bulging degenerated discs, osteophytic ridging and uncinate
spurring mainly at C3-4, C4-5 and C5-6. No significant spinal
stenosis.
3. Mild left foraminal stenosis at C3-4.
4. Moderate right foraminal stenosis at C5-6.
